# Patient Record
Sex: Male | Born: 1955 | Race: Black or African American | Hispanic: No | Marital: Married | Smoking: Current every day smoker
Health system: Southern US, Community
[De-identification: ages and names within clinical notes are randomized; demographics above are authoritative.]

## PROBLEM LIST (undated history)

## (undated) DIAGNOSIS — M1990 Unspecified osteoarthritis, unspecified site: Secondary | ICD-10-CM

## (undated) DIAGNOSIS — I340 Nonrheumatic mitral (valve) insufficiency: Secondary | ICD-10-CM

## (undated) DIAGNOSIS — Z8739 Personal history of other diseases of the musculoskeletal system and connective tissue: Secondary | ICD-10-CM

## (undated) DIAGNOSIS — E119 Type 2 diabetes mellitus without complications: Secondary | ICD-10-CM

## (undated) DIAGNOSIS — E785 Hyperlipidemia, unspecified: Secondary | ICD-10-CM

## (undated) DIAGNOSIS — G8918 Other acute postprocedural pain: Secondary | ICD-10-CM

## (undated) DIAGNOSIS — R011 Cardiac murmur, unspecified: Secondary | ICD-10-CM

## (undated) DIAGNOSIS — R29898 Other symptoms and signs involving the musculoskeletal system: Secondary | ICD-10-CM

## (undated) DIAGNOSIS — G894 Chronic pain syndrome: Secondary | ICD-10-CM

## (undated) DIAGNOSIS — M5410 Radiculopathy, site unspecified: Secondary | ICD-10-CM

## (undated) DIAGNOSIS — I10 Essential (primary) hypertension: Secondary | ICD-10-CM

## (undated) DIAGNOSIS — G8929 Other chronic pain: Secondary | ICD-10-CM

## (undated) HISTORY — DX: Personal history of other diseases of the musculoskeletal system and connective tissue: Z87.39

## (undated) HISTORY — PX: PICC LINE PLACE PERIPHERAL (ARMC HX): HXRAD1248

## (undated) HISTORY — DX: Other symptoms and signs involving the musculoskeletal system: R29.898

## (undated) HISTORY — DX: Unspecified osteoarthritis, unspecified site: M19.90

## (undated) HISTORY — DX: Essential (primary) hypertension: I10

## (undated) HISTORY — DX: Chronic pain syndrome: G89.4

## (undated) HISTORY — PX: SPINE SURGERY: SHX786

## (undated) HISTORY — DX: Type 2 diabetes mellitus without complications: E11.9

## (undated) HISTORY — DX: Nonrheumatic mitral (valve) insufficiency: I34.0

## (undated) HISTORY — DX: Hyperlipidemia, unspecified: E78.5

## (undated) HISTORY — DX: Cardiac murmur, unspecified: R01.1

## (undated) HISTORY — DX: Other chronic pain: G89.29

## (undated) HISTORY — DX: Other acute postprocedural pain: G89.18

## (undated) HISTORY — DX: Radiculopathy, site unspecified: M54.10

## (undated) HISTORY — PX: CARPAL TUNNEL RELEASE: SHX101

---

## 2008-03-28 ENCOUNTER — Ambulatory Visit: Payer: Self-pay | Admitting: General Surgery

## 2011-10-02 DIAGNOSIS — F17200 Nicotine dependence, unspecified, uncomplicated: Secondary | ICD-10-CM | POA: Insufficient documentation

## 2011-10-02 DIAGNOSIS — E669 Obesity, unspecified: Secondary | ICD-10-CM | POA: Insufficient documentation

## 2011-10-02 DIAGNOSIS — B182 Chronic viral hepatitis C: Secondary | ICD-10-CM | POA: Insufficient documentation

## 2012-10-20 DIAGNOSIS — I10 Essential (primary) hypertension: Secondary | ICD-10-CM | POA: Insufficient documentation

## 2012-10-20 DIAGNOSIS — E785 Hyperlipidemia, unspecified: Secondary | ICD-10-CM | POA: Insufficient documentation

## 2012-10-20 DIAGNOSIS — N529 Male erectile dysfunction, unspecified: Secondary | ICD-10-CM | POA: Insufficient documentation

## 2014-01-01 ENCOUNTER — Emergency Department: Payer: Self-pay | Admitting: Emergency Medicine

## 2014-01-01 LAB — CBC
HCT: 40.2 % (ref 40.0–52.0)
HGB: 13.6 g/dL (ref 13.0–18.0)
MCH: 31 pg (ref 26.0–34.0)
MCHC: 33.7 g/dL (ref 32.0–36.0)
MCV: 92 fL (ref 80–100)
Platelet: 170 10*3/uL (ref 150–440)
RBC: 4.38 10*6/uL — ABNORMAL LOW (ref 4.40–5.90)
RDW: 14.2 % (ref 11.5–14.5)
WBC: 10.4 10*3/uL (ref 3.8–10.6)

## 2014-01-01 LAB — URINALYSIS, COMPLETE
BACTERIA: NONE SEEN
Bilirubin,UR: NEGATIVE
Blood: NEGATIVE
Glucose,UR: NEGATIVE mg/dL (ref 0–75)
KETONE: NEGATIVE
Leukocyte Esterase: NEGATIVE
NITRITE: NEGATIVE
PH: 5 (ref 4.5–8.0)
Protein: 75
SPECIFIC GRAVITY: 1.025 (ref 1.003–1.030)
SQUAMOUS EPITHELIAL: NONE SEEN
WBC UR: 1 /HPF (ref 0–5)

## 2014-01-01 LAB — BASIC METABOLIC PANEL
ANION GAP: 5 — AB (ref 7–16)
BUN: 15 mg/dL (ref 7–18)
Calcium, Total: 8.6 mg/dL (ref 8.5–10.1)
Chloride: 109 mmol/L — ABNORMAL HIGH (ref 98–107)
Co2: 24 mmol/L (ref 21–32)
Creatinine: 1.04 mg/dL (ref 0.60–1.30)
Glucose: 140 mg/dL — ABNORMAL HIGH (ref 65–99)
OSMOLALITY: 279 (ref 275–301)
Potassium: 4.4 mmol/L (ref 3.5–5.1)
Sodium: 138 mmol/L (ref 136–145)

## 2014-01-01 LAB — TROPONIN I: Troponin-I: <0.02 ng/mL

## 2014-01-02 LAB — PRO B NATRIURETIC PEPTIDE: B-TYPE NATIURETIC PEPTID: 400 pg/mL — AB (ref 0–125)

## 2014-05-15 ENCOUNTER — Ambulatory Visit: Payer: Self-pay | Admitting: Anesthesiology

## 2014-05-16 ENCOUNTER — Ambulatory Visit: Payer: Self-pay | Admitting: Orthopedic Surgery

## 2014-05-19 DIAGNOSIS — Z9889 Other specified postprocedural states: Secondary | ICD-10-CM | POA: Insufficient documentation

## 2014-06-15 ENCOUNTER — Ambulatory Visit: Payer: Self-pay | Admitting: Orthopedic Surgery

## 2014-06-26 DIAGNOSIS — M5410 Radiculopathy, site unspecified: Secondary | ICD-10-CM

## 2014-06-26 HISTORY — DX: Radiculopathy, site unspecified: M54.10

## 2014-07-07 DIAGNOSIS — R29898 Other symptoms and signs involving the musculoskeletal system: Secondary | ICD-10-CM

## 2014-07-07 HISTORY — DX: Other symptoms and signs involving the musculoskeletal system: R29.898

## 2014-08-24 ENCOUNTER — Encounter: Payer: Self-pay | Admitting: Podiatry

## 2014-08-24 ENCOUNTER — Ambulatory Visit (INDEPENDENT_AMBULATORY_CARE_PROVIDER_SITE_OTHER): Payer: 59 | Admitting: Podiatry

## 2014-08-24 VITALS — BP 139/75 | HR 78 | Resp 16 | Ht 74.0 in | Wt 208.0 lb

## 2014-08-24 DIAGNOSIS — M79609 Pain in unspecified limb: Secondary | ICD-10-CM

## 2014-08-24 DIAGNOSIS — L84 Corns and callosities: Secondary | ICD-10-CM

## 2014-08-24 DIAGNOSIS — B351 Tinea unguium: Secondary | ICD-10-CM

## 2014-08-24 DIAGNOSIS — M79676 Pain in unspecified toe(s): Secondary | ICD-10-CM

## 2014-08-24 NOTE — Patient Instructions (Signed)
Diabetes and Foot Care Diabetes may cause you to have problems because of poor blood supply (circulation) to your feet and legs. This may cause the skin on your feet to become thinner, break easier, and heal more slowly. Your skin may become dry, and the skin may peel and crack. You may also have nerve damage in your legs and feet causing decreased feeling in them. You may not notice minor injuries to your feet that could lead to infections or more serious problems. Taking care of your feet is one of the most important things you can do for yourself.  HOME CARE INSTRUCTIONS  Wear shoes at all times, even in the house. Do not go barefoot. Bare feet are easily injured.  Check your feet daily for blisters, cuts, and redness. If you cannot see the bottom of your feet, use a mirror or ask someone for help.  Wash your feet with warm water (do not use hot water) and mild soap. Then pat your feet and the areas between your toes until they are completely dry. Do not soak your feet as this can dry your skin.  Apply a moisturizing lotion or petroleum jelly (that does not contain alcohol and is unscented) to the skin on your feet and to dry, brittle toenails. Do not apply lotion between your toes.  Trim your toenails straight across. Do not dig under them or around the cuticle. File the edges of your nails with an emery board or nail file.  Do not cut corns or calluses or try to remove them with medicine.  Wear clean socks or stockings every day. Make sure they are not too tight. Do not wear knee-high stockings since they may decrease blood flow to your legs.  Wear shoes that fit properly and have enough cushioning. To break in new shoes, wear them for just a few hours a day. This prevents you from injuring your feet. Always look in your shoes before you put them on to be sure there are no objects inside.  Do not cross your legs. This may decrease the blood flow to your feet.  If you find a minor scrape,  cut, or break in the skin on your feet, keep it and the skin around it clean and dry. These areas may be cleansed with mild soap and water. Do not cleanse the area with peroxide, alcohol, or iodine.  When you remove an adhesive bandage, be sure not to damage the skin around it.  If you have a wound, look at it several times a day to make sure it is healing.  Do not use heating pads or hot water bottles. They may burn your skin. If you have lost feeling in your feet or legs, you may not know it is happening until it is too late.  Make sure your health care provider performs a complete foot exam at least annually or more often if you have foot problems. Report any cuts, sores, or bruises to your health care provider immediately. SEEK MEDICAL CARE IF:   You have an injury that is not healing.  You have cuts or breaks in the skin.  You have an ingrown nail.  You notice redness on your legs or feet.  You feel burning or tingling in your legs or feet.  You have pain or cramps in your legs and feet.  Your legs or feet are numb.  Your feet always feel cold. SEEK IMMEDIATE MEDICAL CARE IF:   There is increasing redness,   swelling, or pain in or around a wound.  There is a red line that goes up your leg.  Pus is coming from a wound.  You develop a fever or as directed by your health care provider.  You notice a bad smell coming from an ulcer or wound. Document Released: 12/12/2000 Document Revised: 08/17/2013 Document Reviewed: 05/24/2013 [employer] Patient Information 2015 [employer]. This information is not intended to replace advice given to you by your health care provider. Make sure you discuss any questions you have with your health care provider.  

## 2014-08-24 NOTE — Progress Notes (Signed)
   Subjective:    Patient ID: Willie Krueger, male    DOB: [birth year], 58 y.o.   MRN: [id number]  HPI Comments: 58 year old male presents the office today for diabetic risk assessment and nail debridement. He said that his nails are thick, discolored. States he has some tenderness over the nails with shoe gear. He has been using over-the-counter fungus treatment which she states has been helping. No other complaints at this time.  No history of ulceration. No claudication symptoms.   Review of Systems  Constitutional: Positive for activity change, appetite change and unexpected weight change.  HENT: Positive for sinus pressure.   Skin:       Change in nails  All other systems reviewed and are negative.      Objective:   Physical Exam AAO x3, NAD DP/PT pulses palpable b/l. CRT < 3 sec Protective sensation intact with Simms Weinstein monofilament. Vibratory sensation intact. Nails on lesser digits are hypertrophic, elongated, dystrophic, with slight discoloration Bilateral hallux toenails are very hypertrophic, brittle, yellow brownish discoloration, tender to palpation with large callus formation at the distal aspect of the digit connecting to the hypertrophic nails. No drainage, erythema or other clinical signs of infection. No open lesions. No leg pain, swelling, warmth. MMT 5/5      Assessment & Plan:  58 year old male with symptomatic onychomycosis, the keratotic lesions distal hallux bilaterally. -Various treatment options both surgical and conservative discussed with the patient in detail including alternatives, risks, complications.  -Nail sharply debrided V77 without complications. -Hyperkeratotic lesions sharply debrided x2 without complications. -Can continue over-the-counter fungal treatments. Discussed other treatment options with the patient. -Daily foot inspection discussed -Followup in 3 months or sooner if any palms are to arise or any changes symptoms

## 2014-11-09 DIAGNOSIS — R011 Cardiac murmur, unspecified: Secondary | ICD-10-CM | POA: Insufficient documentation

## 2014-11-11 DIAGNOSIS — I340 Nonrheumatic mitral (valve) insufficiency: Secondary | ICD-10-CM | POA: Insufficient documentation

## 2014-11-16 ENCOUNTER — Ambulatory Visit (INDEPENDENT_AMBULATORY_CARE_PROVIDER_SITE_OTHER): Payer: 59 | Admitting: Podiatry

## 2014-11-16 DIAGNOSIS — M79676 Pain in unspecified toe(s): Secondary | ICD-10-CM

## 2014-11-16 DIAGNOSIS — B351 Tinea unguium: Secondary | ICD-10-CM

## 2014-11-16 DIAGNOSIS — L84 Corns and callosities: Secondary | ICD-10-CM

## 2014-11-16 NOTE — Progress Notes (Signed)
Patient ID: Willie Krueger, male   DOB: 1956-04-08, 58 y.o.   MRN: [id number]  Subjective: 58 year old male presents the office today for diabetic risk assessment and for painful elongated toenails as well as the calluses to both of his big toes. He states the nails or particular the painful with shoe gear. He does that he trims the nails on his own between appointments. Since last appointment he states that he has had back surgery and he is also seen a neurologist for neurological symptoms of his lower extremities and other issues. He states that since the back surgery he has had improvement in symptoms to his feet. He continues with the over-the-counter fungus treatment. Denies any redness or drainage from the nail sites. No other complaints at this time. States his blood sugar has been running between 119-130  Objective: AAO x3, NAD DP/PT pulses palpable bilaterally, CRT less than 3 seconds Protective sensation intact with Simms Weinstein monofilament, vibratory sensation intact Nails hypertrophic, dystrophic, elongated, dystrophic 10. There is a small amount of dried blood within the right fourth digit distal nail in which there is skin  Build-up directly under the nail. There is no drainage, surrounding erythema or any thinning saline disc. No local signs of infection.  Thick hyperkeratotic tissue old up on the plantar and distal aspect of bilateral hallux and within the distal aspect of the nail. There some evidence of dried blood underneath the callus on the right side. No surrounding erythema or ascending cellulitis.  No calf pain with compression, swelling, warmth, erythema.  MMT 5/5, ROM WNL  Assessment: 58 year old male with onychomycosis, thick hyperkeratotic lesion cyst hallux bilaterally.   Plan: -Treatment options discussed including alternatives, risks, complications -Nails sharply debrided 10. There is a small amount of bleeding noted in the right fourth digit over the area of  dried blood. Discussed with the patient to keep this area clean and apply and buttock ointment and a Band-Aid daily. If the area does not heal in the next 2 weeks to call the office to make an appointment. Also monitoring clinical signs or symptoms of infection and directed to call the office immediately if any are to occur or go to the ER. -Bilateral hallux hyperkeratotic lesion shop and debrided without consultation. There is thick hyperkeratotic tissue on the right hallux with some evidence dried blood. Upon debridement there is no open lesions identified. Discussed the patient to continue to monitor this area for any skin breakdown. -Discussed the importance of daily foot inspection. -Follow-up in 3 months. In the meantime, call the office in the questions, concerns, changes symptoms. Also call if there is any skin breakdown or any problems.

## 2014-11-16 NOTE — Patient Instructions (Signed)
Keep antibiotic ointment and a band-aid over the right 4th digit. Monitor for any signs/symptoms of infection. Call the office immediately if any occur or go directly to the emergency room. Call with any questions/concerns.   Diabetes and Foot Care Diabetes may cause you to have problems because of poor blood supply (circulation) to your feet and legs. This may cause the skin on your feet to become thinner, break easier, and heal more slowly. Your skin may become dry, and the skin may peel and crack. You may also have nerve damage in your legs and feet causing decreased feeling in them. You may not notice minor injuries to your feet that could lead to infections or more serious problems. Taking care of your feet is one of the most important things you can do for yourself.  HOME CARE INSTRUCTIONS  Wear shoes at all times, even in the house. Do not go barefoot. Bare feet are easily injured.  Check your feet daily for blisters, cuts, and redness. If you cannot see the bottom of your feet, use a mirror or ask someone for help.  Wash your feet with warm water (do not use hot water) and mild soap. Then pat your feet and the areas between your toes until they are completely dry. Do not soak your feet as this can dry your skin.  Apply a moisturizing lotion or petroleum jelly (that does not contain alcohol and is unscented) to the skin on your feet and to dry, brittle toenails. Do not apply lotion between your toes.  Trim your toenails straight across. Do not dig under them or around the cuticle. File the edges of your nails with an emery board or nail file.  Do not cut corns or calluses or try to remove them with medicine.  Wear clean socks or stockings every day. Make sure they are not too tight. Do not wear knee-high stockings since they may decrease blood flow to your legs.  Wear shoes that fit properly and have enough cushioning. To break in new shoes, wear them for just a few hours a day. This  prevents you from injuring your feet. Always look in your shoes before you put them on to be sure there are no objects inside.  Do not cross your legs. This may decrease the blood flow to your feet.  If you find a minor scrape, cut, or break in the skin on your feet, keep it and the skin around it clean and dry. These areas may be cleansed with mild soap and water. Do not cleanse the area with peroxide, alcohol, or iodine.  When you remove an adhesive bandage, be sure not to damage the skin around it.  If you have a wound, look at it several times a day to make sure it is healing.  Do not use heating pads or hot water bottles. They may burn your skin. If you have lost feeling in your feet or legs, you may not know it is happening until it is too late.  Make sure your health care provider performs a complete foot exam at least annually or more often if you have foot problems. Report any cuts, sores, or bruises to your health care provider immediately. SEEK MEDICAL CARE IF:   You have an injury that is not healing.  You have cuts or breaks in the skin.  You have an ingrown nail.  You notice redness on your legs or feet.  You feel burning or tingling in your legs  or feet.  You have pain or cramps in your legs and feet.  Your legs or feet are numb.  Your feet always feel cold. SEEK IMMEDIATE MEDICAL CARE IF:   There is increasing redness, swelling, or pain in or around a wound.  There is a red line that goes up your leg.  Pus is coming from a wound.  You develop a fever or as directed by your health care provider.  You notice a bad smell coming from an ulcer or wound. Document Released: 12/12/2000 Document Revised: 08/17/2013 Document Reviewed: 05/24/2013 [facility] Patient Information 2015 [place], [place]. This information is not intended to replace advice given to you by your health care provider. Make sure you discuss any questions you have with your health care provider.

## 2015-02-07 DIAGNOSIS — G9520 Unspecified cord compression: Secondary | ICD-10-CM | POA: Insufficient documentation

## 2015-02-15 ENCOUNTER — Ambulatory Visit: Payer: 59 | Admitting: Podiatry

## 2015-03-27 ENCOUNTER — Ambulatory Visit: Payer: 59 | Admitting: Podiatry

## 2015-04-21 NOTE — Op Note (Signed)
PATIENT NAME:  Willie Krueger, Willie Krueger MR#:  [id number] DATE OF BIRTH:  [birth year]  DATE OF PROCEDURE:  06/15/2014  PREOPERATIVE DIAGNOSIS: Left carpal tunnel syndrome.   POSTOPERATIVE DIAGNOSIS: Left carpal tunnel syndrome.   PROCEDURE: Left carpal tunnel release.   ANESTHESIA: Local with MAC.   DESCRIPTION OF PROCEDURE: The patient was brought to the Operating Room, and after attempted Bier block failed because the tourniquet would not sequentially deflate as needed, determination was made to do local with MAC. The tourniquet was applied to the forearm, and after a time out procedure was carried out, the area of the planned incision was infiltrated with 5 mL of 1% Xylocaine and 10 mL of 0.5% Sensorcaine. The arm was then prepped and draped in the usual sterile fashion. Appropriate repeat timeout procedure and patient identification procedure was carried out. Tourniquet raised to 250 mmHg. Approximately a 2.5 cm incision was made starting at the distal wrist flexion crease in line with the ring metacarpal. Skin and subcutaneous tissue was divided, and the transverse carpal ligament was identified and incised. A vascular hemostat was placed beneath this to protect the underlying structures. Release was carried out proximally and distally until the nerve was noted to be free. There was compression in the proximal half of the canal, with mild flexor tenosynovitis. Compression of the nerve appeared to be resolved at the close of the case. The wound was irrigated and then closed with simple interrupted 4-0 nylon skin sutures. Xeroform, 4 x 4, Webril and Ace wrap applied. Tourniquet was let down at the close of the case, and patient was sent to the recovery room in stable condition.   ESTIMATED BLOOD LOSS: Minimal.   COMPLICATIONS: None.   SPECIMEN: None.   TOURNIQUET TIME: 13 minutes at 250 mmHg       ____________________________ Laurene Footman, MD [email or web address] D: 06/15/2014 14:53:38 ET T: 06/15/2014  20:45:51 ET JOB#: [id number]  cc: Laurene Footman, MD, <Dictator> Laurene Footman MD ELECTRONICALLY SIGNED 06/15/2014 21:59

## 2015-04-21 NOTE — Op Note (Signed)
PATIENT NAME:  [name], Willie Krueger MR#:  [id number] DATE OF BIRTH:  1956-07-09  DATE OF PROCEDURE:  05/16/2014  PREOPERATIVE DIAGNOSIS: Right carpal tunnel syndrome.   POSTOPERATIVE DIAGNOSIS: Right carpal tunnel syndrome.   PROCEDURE: Right carpal tunnel release.   ANESTHESIA: Bier block.   SURGEON: Laurene Footman, M.D.   DESCRIPTION OF PROCEDURE: The patient was brought to the operating room and after adequate anesthesia was obtained, the arm was prepped and draped in the usual sterile fashion. After patient identification and timeout procedures were completed, the hand was approached with a volar incision approximately 2 cm long in line with the fourth metacarpal. Skin and subcutaneous tissue were dissected and the transverse carpal ligament identified. This was opened, and a small hemostat was placed underneath the transverse carpal ligament. It was released distally and then proximally. At the proximal release, compression was noted within the carpal tunnel. There were no masses. Mild flexor tenosynovitis. The wound was then irrigated and infiltrated with 10 mL of 0.5% Sensorcaine for postop analgesia. The wound was closed with simple interrupted 4-0 nylon skin sutures. Xeroform, 4 x 4, Webril and Ace wrap were applied. Tourniquet was let down.   TOURNIQUET TIME: 21 minutes at 300 mmHg.   COMPLICATIONS: None.   SPECIMEN: None.   ____________________________ Laurene Footman, MD [email or web address] D: 05/16/2014 22:02:12 ET T: 05/16/2014 22:18:21 ET JOB#: [id number]  cc: Laurene Footman, MD, <Dictator> Laurene Footman MD ELECTRONICALLY SIGNED 05/17/2014 10:11

## 2015-05-04 DIAGNOSIS — G894 Chronic pain syndrome: Secondary | ICD-10-CM

## 2015-05-04 HISTORY — DX: Chronic pain syndrome: G89.4

## 2015-06-19 ENCOUNTER — Ambulatory Visit (INDEPENDENT_AMBULATORY_CARE_PROVIDER_SITE_OTHER): Payer: 59 | Admitting: Podiatry

## 2015-06-19 DIAGNOSIS — M79676 Pain in unspecified toe(s): Secondary | ICD-10-CM

## 2015-06-19 DIAGNOSIS — B351 Tinea unguium: Secondary | ICD-10-CM | POA: Diagnosis not present

## 2015-06-19 DIAGNOSIS — Q828 Other specified congenital malformations of skin: Secondary | ICD-10-CM

## 2015-06-19 DIAGNOSIS — L97522 Non-pressure chronic ulcer of other part of left foot with fat layer exposed: Secondary | ICD-10-CM

## 2015-06-19 MED ORDER — CEPHALEXIN 500 MG PO CAPS: 500.0000 mg | ORAL_CAPSULE | Freq: Three times a day (TID) | ORAL | Status: DC

## 2015-06-19 NOTE — Progress Notes (Signed)
Patient ID: Willie Krueger, male   DOB: 1956/06/06, 59 y.o.   MRN: [id number]  Subjective: 59 y.o. male returns the office today for painful, elongated, thickened toenails which he is unable to trim himself.. Denies any redness or drainage around the nails. Denies any acute changes since last appointment and no new complaints today. Denies any systemic complaints such as fevers, chills, nausea, vomiting.   Objective: AAO 3, NAD DP/PT pulses palpable, CRT less than 3 seconds Protective sensation intact with Simms Weinstein monofilament, Achilles tendon reflex intact.  Nails hypertrophic, dystrophic, elongated, brittle, discolored 10. There is tenderness overlying the nails 1-5 bilaterally. There is no surrounding erythema or drainage along the nail sites. On the plantar aspect of the right hallux there is hyperkeratotic lesion. Upon debridement lesion there is an ulceration measuring fussy 1 x 1 cm with serous drainage underlying area. There is no purulence identified and there is no swelling erythema, ascending cellulitis, fluctuance, crepitus. There is no probe to bone, undermining, tunneling. No malodor. There is edema to the right foot and there is a slight increase in warmth to the foot. There is no specific areas of cellulitis or erythema. There is no further ulceration identified. No other open lesions or pre-ulcerative lesions are identified. No other areas of tenderness bilateral lower extremities. No overlying edema, erythema, increased warmth. No pain with calf compression, swelling, warmth, erythema.  Assessment: Patient presents with symptomatic onychomycosis; right foot swelling, ulceration  Plan: -Treatment options including alternatives, risks, complications were discussed -Nails sharply debrided 10 without complication/bleeding. -Right hallux ulceration/hyperkeratotic lesion sharply debrided without complications. Given the increase in warmth and swelling to the right foot will  start Keflex for infection. Monitor closely for any signs or symptoms of worsening infection and directed to call the office immediately should any occur go directly to the emergency room. -Discussed daily foot inspection. If there are any changes, to call the office immediately.  -Follow-up in 1 week or sooner if any problems are to arise. In the meantime, encouraged to call the office with any questions, concerns, changes symptoms.  Celesta Gentile, DPM

## 2015-06-19 NOTE — Patient Instructions (Signed)
Monitor for any signs/symptoms of infection. Call the office immediately if any occur or go directly to the emergency room. Call with any questions/concerns.  

## 2015-06-24 DIAGNOSIS — L97509 Non-pressure chronic ulcer of other part of unspecified foot with unspecified severity: Secondary | ICD-10-CM | POA: Insufficient documentation

## 2015-06-26 ENCOUNTER — Ambulatory Visit: Payer: 59 | Admitting: Podiatry

## 2015-08-23 ENCOUNTER — Ambulatory Visit: Payer: 59 | Admitting: Podiatry

## 2015-09-04 ENCOUNTER — Ambulatory Visit (INDEPENDENT_AMBULATORY_CARE_PROVIDER_SITE_OTHER): Payer: 59 | Admitting: Podiatry

## 2015-09-04 ENCOUNTER — Encounter: Payer: Self-pay | Admitting: Podiatry

## 2015-09-04 ENCOUNTER — Ambulatory Visit (INDEPENDENT_AMBULATORY_CARE_PROVIDER_SITE_OTHER): Payer: 59

## 2015-09-04 VITALS — BP 134/78 | HR 72 | Resp 18

## 2015-09-04 DIAGNOSIS — R52 Pain, unspecified: Secondary | ICD-10-CM

## 2015-09-04 DIAGNOSIS — L97511 Non-pressure chronic ulcer of other part of right foot limited to breakdown of skin: Secondary | ICD-10-CM

## 2015-09-04 DIAGNOSIS — M79676 Pain in unspecified toe(s): Secondary | ICD-10-CM | POA: Diagnosis not present

## 2015-09-04 DIAGNOSIS — B351 Tinea unguium: Secondary | ICD-10-CM

## 2015-09-04 MED ORDER — CEPHALEXIN 500 MG PO CAPS: 500.0000 mg | ORAL_CAPSULE | Freq: Three times a day (TID) | ORAL | Status: DC

## 2015-09-04 NOTE — Progress Notes (Signed)
Patient ID: Willie Krueger, male   DOB: March 31, 1956, 59 y.o.   MRN: [id number]  Subjective: 59 year old male presents the office today for follow-up evaluation of right hallux ulceration, swelling. He states he is missed last couple appointments because he overslept. He states that after last appointment he did complete the antibiotics he has been applying antibiotic ointment to the wound daily. He has noticed the toe become somewhat more swollen over the last couple weeks. He denies any drainage or purulence from the wound. Denies any surrounding redness or red streaks. Denies any systemic complaints such as fevers, chills, nausea, vomiting. No calf pain, chest pain, shortness of breath. No other complaints at this time in no acute changes otherwise.  Objective: AAO 3, NAD DP/PT pulses palpable, CRT less than 3 seconds Protective sensation decreased with Simms Weinstein monofilament There is mild edema to the right hallux. The distal aspect is a hyperkeratotic lesion with associated annular ulceration the distal aspect of the hallux. Upon debridement the ulceration measures 0.5 x 0.5 cm in a superficial with a granular wound base. There is no probing, undermining, tunneling. There is no drainage or purulence. There is no surrounding erythema, ascending cellulitis, fluctuance, crepitus, malodor.  On the medial aspect of the left foot there is a pre-ulcerative lesion. Upon debridement underlying skin is intact. No other open lesions or pre-ulcer lesions identified. Nails hypertrophic, dystrophic, brittle, discolored, elongated 10. There is tenderness overlying nails 1-5 bilaterally. There is no surrounding erythema or drainage from the nail sites. No other areas of tenderness to bilateral lower extremities No pain with calf compression, selling, warmth, erythema.  Assessment: 59 year old male right hallux ulceration with localized infection; symptomatic onychomycosis  Plan: -X-rays were obtained  and reviewed with the patient. There is no definitive cortical destruction to suggest osteomyelitis at this time. No soft tissue emphysema. -Treatment options discussed including all alternatives, risks, and complications -Hyperkeratotic lesions/wound was sharply debrided to healthy, bleeding, granular wound base. Iodosorb was applied followed by dry sterile dressing. Continue with daily dressing changes the antibiotic ointment and a bandage. -Prescribed Keflex -Dispensed surgical shoe to help offload the area -Nail sharply debrided 10 without complication/bleeding -Monitor for any clinical signs or symptoms of infection and directed to call the office immediately should any occur or go to the ER. -Follow-up in 10 days or sooner if any problems arise. In the meantime, encouraged to call the office with any questions, concerns, change in symptoms.   Celesta Gentile, DPM

## 2015-09-18 ENCOUNTER — Ambulatory Visit (INDEPENDENT_AMBULATORY_CARE_PROVIDER_SITE_OTHER): Payer: 59 | Admitting: Podiatry

## 2015-09-18 ENCOUNTER — Encounter: Payer: Self-pay | Admitting: Podiatry

## 2015-09-18 VITALS — BP 154/92 | HR 75 | Resp 18

## 2015-09-18 DIAGNOSIS — L97522 Non-pressure chronic ulcer of other part of left foot with fat layer exposed: Secondary | ICD-10-CM | POA: Diagnosis not present

## 2015-09-18 NOTE — Progress Notes (Signed)
Patient ID: Willie Krueger, male   DOB: [birth year], 59 y.o.   MRN: [id number]  Subjective: 59 year old male presents the office today for follow-up evaluation of right hallux ulceration. He believes that since last appointment the swelling has decreased his big toe although does continue. He also today several wounds the distal aspect of the hallux. Denies any drainage upon scarring from the area. Denies any redness of the big toe or any red streaks. He is continued on antibiotics. He currently denies any systemic complaints such as fevers, chills, nausea, vomiting. No calf pain, chest pain, shortness of breath. No other complaints at this time in no acute changes otherwise.  Objective: AAO 3, NAD DP/PT pulses palpable, CRT less than 3 seconds Protective sensation decreased with Simms Weinstein monofilament There is mild but improved edema to the right hallux. There are skin lines present in the hallux representative of decreased edema. The distal aspect is a hyperkeratotic lesion with associated annular ulceration the distal aspect of the hallux. Upon debridement the ulceration measures 0.5 x 0.4 x 0.2  cm in a superficial with a granular wound base. There was no purulence expressed. There is only bloody drainage identified. There is no surrounding erythema, ascending cellulitis, fluctuance, crepitus, malodor.  No other open lesions or pre-ulcer lesions identified. No other areas of tenderness to bilateral lower extremities No pain with calf compression, selling, warmth, erythema.  Assessment: 59 year old male right hallux ulceration with resolving infection   Plan: -Treatment options discussed including all alternatives, risks, and complications -Wound was sharply debrided without complications to help it, bleeding, granular wound base. Iodosorb was applied followed by dry sterile dressing. Continue with daily dressing changes with antibiotic ointment and a bandage. -Continue surgical shoe for  offloading for which she has today. Elizebeth Koller course of antibiotics. -Monitor for any clinical signs or symptoms of infection and directed to call the office immediately should any occur or go to the ER. -Follow-up in 10 days or sooner if any problems arise. In the meantime, encouraged to call the office with any questions, concerns, change in symptoms.   Celesta Gentile, DPM

## 2015-10-08 ENCOUNTER — Ambulatory Visit (INDEPENDENT_AMBULATORY_CARE_PROVIDER_SITE_OTHER): Payer: 59 | Admitting: Podiatry

## 2015-10-08 ENCOUNTER — Encounter: Payer: Self-pay | Admitting: Podiatry

## 2015-10-08 VITALS — BP 192/106 | HR 78 | Resp 18

## 2015-10-08 DIAGNOSIS — L97522 Non-pressure chronic ulcer of other part of left foot with fat layer exposed: Secondary | ICD-10-CM | POA: Diagnosis not present

## 2015-10-08 MED ORDER — AMOXICILLIN-POT CLAVULANATE 875-125 MG PO TABS: 1.0000 | ORAL_TABLET | Freq: Two times a day (BID) | ORAL | Status: DC

## 2015-10-13 ENCOUNTER — Encounter: Payer: Self-pay | Admitting: Podiatry

## 2015-10-13 NOTE — Progress Notes (Signed)
Patient ID: Willie Krueger, male   DOB: [birth year], 59 y.o.   MRN: [id number]  Subjective: 59 year old male presents the office today for follow-up evaluation of right hallux ulceration. He states he is continue with antibiotics. He states that it is not as swollen the toe is not as red. He denies any drainage from the wound of  The right hallux. He does apply antibiotic ointment over the area daily. No other complaints at this time in no acute changes. He denies any systemic complaints such as fevers, chills, nausea, vomiting. No calf pain, chest pain, shortness of breath.  Objective: AAO 3, NAD DP/PT pulses palpable 2/4, CRT less than 3 seconds Protective sensation decreased with Simms Weinstein monofilament There is improved edema to the right hallux, but it does continue. The distal aspect of the hallux is a thick  hyperkeratotic lesion with associated annular ulceration the distal aspect of the hallux. Upon debridement the ulceration measures 0.5 x 0.3  cm in a superficial with a granular wound base. There was no purulence expressed. There is no surrounding erythema, ascending cellulitis, fluctuance, crepitus, malodor.  No other open lesions or pre-ulcer lesions identified. No other areas of tenderness to bilateral lower extremities No pain with calf compression, selling, warmth, erythema.  Assessment: 59 year old male right hallux ulceration with resolving infection   Plan: -Treatment options discussed including all alternatives, risks, and complications -Wound was sharply debrided without complications to help it, bleeding, granular wound base. Iodosorb was applied followed by dry sterile dressing.  There is also significant amount hyperkeratotic tissue along the distal aspect of the hallux  Which was debrided as well.Continue with daily dressing changes with antibiotic ointment and a bandage. -Continue surgical shoe for offloading for which she has today. Elizebeth Koller course of  antibiotics. -Monitor for any clinical signs or symptoms of infection and directed to call the office immediately should any occur or go to the ER. -Follow-up in 2 weeks or sooner if any problems arise. In the meantime, encouraged to call the office with any questions, concerns, change in symptoms.  *If swelling continues may need an MRI.  Celesta Gentile, DPM

## 2015-10-22 ENCOUNTER — Ambulatory Visit (INDEPENDENT_AMBULATORY_CARE_PROVIDER_SITE_OTHER): Payer: 59

## 2015-10-22 ENCOUNTER — Ambulatory Visit (INDEPENDENT_AMBULATORY_CARE_PROVIDER_SITE_OTHER): Payer: 59 | Admitting: Podiatry

## 2015-10-22 ENCOUNTER — Encounter: Payer: Self-pay | Admitting: Podiatry

## 2015-10-22 VITALS — BP 176/97 | HR 87 | Resp 18

## 2015-10-22 DIAGNOSIS — M86171 Other acute osteomyelitis, right ankle and foot: Secondary | ICD-10-CM

## 2015-10-22 DIAGNOSIS — M79671 Pain in right foot: Secondary | ICD-10-CM | POA: Diagnosis not present

## 2015-10-22 DIAGNOSIS — L97521 Non-pressure chronic ulcer of other part of left foot limited to breakdown of skin: Secondary | ICD-10-CM

## 2015-10-22 MED ORDER — CLINDAMYCIN HCL 300 MG PO CAPS: 300.0000 mg | ORAL_CAPSULE | Freq: Three times a day (TID) | ORAL | Status: DC

## 2015-10-22 NOTE — Progress Notes (Signed)
Patient ID: Willie Krueger, male   DOB: 1956-06-16, 59 y.o.   MRN: [id number]  Subjective: Patient was infiltrated for follow-up evaluation of right hallux ulceration. He continues to change the dressing daily with Silvadene. He does state that the right foot remains swollen. Denies any pus or drainage, never denies any swelling redness or red streaks. He denies any systemic complaints such as fevers, chills, nausea, vomiting. He is also requesting nail debridement time as they're elongated causing pressure in his shoes. Denies any redness or drainage in the nail sites. No other complaints at this time.  Objective: AAO 3, NAD DP/PT pulses 2/4, CRT less than 3 seconds Protective sensation decreased with Simms Weinstein monofilament Discontinuation and also the distal aspect of the right hallux which appears to be superficial granular wound base. There is hyperkeratotic tissue over on the distal tip of the hallux. There continues to be edema to the hallux and there is increased edema to the foot as well. There is a slight increase in warmth the right foot compared to the left foot. There is no areas of fluctuation or crepitus. No surrounding erythema, ascending cellulitis. No other open lesions or pre-ulcer lesions identified bilaterally. The nails are hypertrophic, dystrophic, discolored. There is no swelling erythema drainage on the remaining nails. No pain with calf compression, swelling, warmth, erythema.  Assessment: 59 year old male with continuation right hallux ulceration with increased edema/warmth to the right foot; onychomycosis  Plan: -At this time the symptoms continue and the right foot continues: I recommended MRI. X-rays obtained today which did reveal possible cortical changes of the distal phalanx of the hallux concerning prostate myelitis. An MRI was ordered. Continue daily dressing changes. The wound was debrided of hyperkeratotic tissue today's appointment. Prescribed clindamycin.   -The nails were also debrided without complication/bleeding. Monitor for any clinical signs or symptoms of infection and directed to call the office immediately should any occur or go to the ER. -Follow-up in 2 weeks or sooner if any problems arise. In the meantime, encouraged to call the office with any questions, concerns, change in symptoms.   Celesta Gentile, DPM

## 2015-10-24 ENCOUNTER — Telehealth: Payer: Self-pay

## 2015-10-24 NOTE — Telephone Encounter (Signed)
Prior auth received from [place] #[id number], faxed over clinical notes to [phone] for MRI right foot w/o contrast, rule out osteomyletis. Auth number faxed to [id number] [facility]

## 2015-10-26 ENCOUNTER — Encounter: Payer: Self-pay | Admitting: *Deleted

## 2015-10-26 ENCOUNTER — Telehealth: Payer: Self-pay | Admitting: *Deleted

## 2015-10-26 MED ORDER — DOXYCYCLINE HYCLATE 100 MG PO TABS: 100.0000 mg | ORAL_TABLET | Freq: Two times a day (BID) | ORAL | Status: DC

## 2015-10-26 NOTE — Telephone Encounter (Addendum)
Pt states the Cleocin is causing his legs to break out in a rash.  I instructed pt to stop the Cleocin, begin OTC Benadryl as package directs, I also informed pt the Benadryl may increase the sleepiness with his other medication.  I told pt if he had difficulty breathing go to the ER.  Dr. Jacqualyn Posey ordered Doxycycline 100mg  #20 1 bid.

## 2015-10-31 ENCOUNTER — Encounter: Payer: Self-pay | Admitting: Pain Medicine

## 2015-10-31 ENCOUNTER — Other Ambulatory Visit: Payer: Self-pay | Admitting: Pain Medicine

## 2015-10-31 ENCOUNTER — Ambulatory Visit: Payer: 59 | Attending: Pain Medicine | Admitting: Pain Medicine

## 2015-10-31 VITALS — BP 150/94 | HR 83 | Temp 98.0°F | Resp 18 | Ht 74.0 in | Wt 239.0 lb

## 2015-10-31 DIAGNOSIS — Q762 Congenital spondylolisthesis: Secondary | ICD-10-CM

## 2015-10-31 DIAGNOSIS — M5412 Radiculopathy, cervical region: Secondary | ICD-10-CM

## 2015-10-31 DIAGNOSIS — B182 Chronic viral hepatitis C: Secondary | ICD-10-CM | POA: Insufficient documentation

## 2015-10-31 DIAGNOSIS — G8929 Other chronic pain: Secondary | ICD-10-CM

## 2015-10-31 DIAGNOSIS — M542 Cervicalgia: Secondary | ICD-10-CM | POA: Diagnosis not present

## 2015-10-31 DIAGNOSIS — M79604 Pain in right leg: Secondary | ICD-10-CM

## 2015-10-31 DIAGNOSIS — E669 Obesity, unspecified: Secondary | ICD-10-CM | POA: Diagnosis not present

## 2015-10-31 DIAGNOSIS — F1990 Other psychoactive substance use, unspecified, uncomplicated: Secondary | ICD-10-CM

## 2015-10-31 DIAGNOSIS — M47812 Spondylosis without myelopathy or radiculopathy, cervical region: Secondary | ICD-10-CM

## 2015-10-31 DIAGNOSIS — F1120 Opioid dependence, uncomplicated: Secondary | ICD-10-CM | POA: Insufficient documentation

## 2015-10-31 DIAGNOSIS — F1190 Opioid use, unspecified, uncomplicated: Secondary | ICD-10-CM | POA: Diagnosis not present

## 2015-10-31 DIAGNOSIS — M5416 Radiculopathy, lumbar region: Secondary | ICD-10-CM

## 2015-10-31 DIAGNOSIS — Z79891 Long term (current) use of opiate analgesic: Secondary | ICD-10-CM

## 2015-10-31 DIAGNOSIS — M545 Low back pain, unspecified: Secondary | ICD-10-CM

## 2015-10-31 DIAGNOSIS — G96198 Other disorders of meninges, not elsewhere classified: Secondary | ICD-10-CM

## 2015-10-31 DIAGNOSIS — E785 Hyperlipidemia, unspecified: Secondary | ICD-10-CM | POA: Insufficient documentation

## 2015-10-31 DIAGNOSIS — M4316 Spondylolisthesis, lumbar region: Secondary | ICD-10-CM | POA: Diagnosis not present

## 2015-10-31 DIAGNOSIS — G5603 Carpal tunnel syndrome, bilateral upper limbs: Secondary | ICD-10-CM | POA: Insufficient documentation

## 2015-10-31 DIAGNOSIS — Z79899 Other long term (current) drug therapy: Secondary | ICD-10-CM | POA: Diagnosis not present

## 2015-10-31 DIAGNOSIS — Z9889 Other specified postprocedural states: Secondary | ICD-10-CM

## 2015-10-31 DIAGNOSIS — M4310 Spondylolisthesis, site unspecified: Secondary | ICD-10-CM

## 2015-10-31 DIAGNOSIS — M549 Dorsalgia, unspecified: Secondary | ICD-10-CM | POA: Diagnosis present

## 2015-10-31 DIAGNOSIS — Z5181 Encounter for therapeutic drug level monitoring: Secondary | ICD-10-CM | POA: Insufficient documentation

## 2015-10-31 DIAGNOSIS — Z8739 Personal history of other diseases of the musculoskeletal system and connective tissue: Secondary | ICD-10-CM

## 2015-10-31 DIAGNOSIS — M79606 Pain in leg, unspecified: Secondary | ICD-10-CM

## 2015-10-31 DIAGNOSIS — M961 Postlaminectomy syndrome, not elsewhere classified: Secondary | ICD-10-CM

## 2015-10-31 DIAGNOSIS — M539 Dorsopathy, unspecified: Secondary | ICD-10-CM

## 2015-10-31 DIAGNOSIS — G9619 Other disorders of meninges, not elsewhere classified: Secondary | ICD-10-CM

## 2015-10-31 DIAGNOSIS — M79605 Pain in left leg: Secondary | ICD-10-CM

## 2015-10-31 HISTORY — DX: Personal history of other diseases of the musculoskeletal system and connective tissue: Z87.39

## 2015-10-31 MED ORDER — GABAPENTIN 400 MG PO CAPS: 800.0000 mg | ORAL_CAPSULE | Freq: Four times a day (QID) | ORAL | Status: DC

## 2015-10-31 MED ORDER — OXYCODONE HCL 15 MG PO TABS: 15.0000 mg | ORAL_TABLET | Freq: Every day | ORAL | Status: DC | PRN

## 2015-10-31 NOTE — Progress Notes (Signed)
Safety precautions to be maintained throughout the outpatient stay will include: orient to surroundings, keep bed in low position, maintain call bell within reach at all times, provide assistance with transfer out of bed and ambulation. Oxycodone pill count # 148/180

## 2015-10-31 NOTE — Patient Instructions (Signed)
Smoking Cessation, Tips for Success If you are ready to quit smoking, congratulations! You have chosen to help yourself be healthier. Cigarettes bring nicotine, tar, carbon monoxide, and other irritants into your body. Your lungs, heart, and blood vessels will be able to work better without these poisons. There are many different ways to quit smoking. Nicotine gum, nicotine patches, a nicotine inhaler, or nicotine nasal spray can help with physical craving. Hypnosis, support groups, and medicines help break the habit of smoking. WHAT THINGS CAN I DO TO MAKE QUITTING EASIER?  Here are some tips to help you quit for good:  Pick a date when you will quit smoking completely. Tell all of your friends and family about your plan to quit on that date.  Do not try to slowly cut down on the number of cigarettes you are smoking. Pick a quit date and quit smoking completely starting on that day.  Throw away all cigarettes.   Clean and remove all ashtrays from your home, work, and car.  On a card, write down your reasons for quitting. Carry the card with you and read it when you get the urge to smoke.  Cleanse your body of nicotine. Drink enough water and fluids to keep your urine clear or pale yellow. Do this after quitting to flush the nicotine from your body.  Learn to predict your moods. Do not let a bad situation be your excuse to have a cigarette. Some situations in your life might tempt you into wanting a cigarette.  Never have "just one" cigarette. It leads to wanting another and another. Remind yourself of your decision to quit.  Change habits associated with smoking. If you smoked while driving or when feeling stressed, try other activities to replace smoking. Stand up when drinking your coffee. Brush your teeth after eating. Sit in a different chair when you read the paper. Avoid alcohol while trying to quit, and try to drink fewer caffeinated beverages. Alcohol and caffeine may urge you to  smoke.  Avoid foods and drinks that can trigger a desire to smoke, such as sugary or spicy foods and alcohol.  Ask people who smoke not to smoke around you.  Have something planned to do right after eating or having a cup of coffee. For example, plan to take a walk or exercise.  Try a relaxation exercise to calm you down and decrease your stress. Remember, you may be tense and nervous for the first 2 weeks after you quit, but this will pass.  Find new activities to keep your hands busy. Play with a pen, coin, or rubber band. Doodle or draw things on paper.  Brush your teeth right after eating. This will help cut down on the craving for the taste of tobacco after meals. You can also try mouthwash.   Use oral substitutes in place of cigarettes. Try using lemon drops, carrots, cinnamon sticks, or chewing gum. Keep them handy so they are available when you have the urge to smoke.  When you have the urge to smoke, try deep breathing.  Designate your home as a nonsmoking area.  If you are a heavy smoker, ask your health care provider about a prescription for nicotine chewing gum. It can ease your withdrawal from nicotine.  Reward yourself. Set aside the cigarette money you save and buy yourself something nice.  Look for support from others. Join a support group or smoking cessation program. Ask someone at home or at work to help you with your plan   to quit smoking.  Always ask yourself, "Do I need this cigarette or is this just a reflex?" Tell yourself, "Today, I choose not to smoke," or "I do not want to smoke." You are reminding yourself of your decision to quit.  Do not replace cigarette smoking with electronic cigarettes (commonly called e-cigarettes). The safety of e-cigarettes is unknown, and some may contain harmful chemicals.  If you relapse, do not give up! Plan ahead and think about what you will do the next time you get the urge to smoke. HOW WILL I FEEL WHEN I QUIT SMOKING? You  may have symptoms of withdrawal because your body is used to nicotine (the addictive substance in cigarettes). You may crave cigarettes, be irritable, feel very hungry, cough often, get headaches, or have difficulty concentrating. The withdrawal symptoms are only temporary. They are strongest when you first quit but will go away within 10-14 days. When withdrawal symptoms occur, stay in control. Think about your reasons for quitting. Remind yourself that these are signs that your body is healing and getting used to being without cigarettes. Remember that withdrawal symptoms are easier to treat than the major diseases that smoking can cause.  Even after the withdrawal is over, expect periodic urges to smoke. However, these cravings are generally short lived and will go away whether you smoke or not. Do not smoke! WHAT RESOURCES ARE AVAILABLE TO HELP ME QUIT SMOKING? Your health care provider can direct you to community resources or hospitals for support, which may include:  Group support.  Education.  Hypnosis.  Therapy.   This information is not intended to replace advice given to you by your health care provider. Make sure you discuss any questions you have with your health care provider.   Document Released: 09/12/2004 Document Revised: 01/05/2015 Document Reviewed: 06/02/2013 [employer] Interactive Patient Education 2016 [employer]  

## 2015-10-31 NOTE — Progress Notes (Signed)
Patient's Name: Willie Krueger MRN: [id number] DOB: [birth year] DOS: 10/31/2015  Primary Reason(s) for Visit: Encounter for evaluation before starting a Medication. CC: Shoulder Pain; Arm Pain; Hand Pain; Back Pain; and Leg Pain   HPI:   Willie Krueger is a 59 y.o. year old, male patient, who returns today as an established patient. He has Toe ulcer (Willie Krueger); Nerve root pain; Cervical spinal cord compression (Willie Krueger); Chronic pain associated with significant psychosocial dysfunction; Cardiac murmur; Chronic hepatitis C virus infection (Willie Krueger); HLD (hyperlipidemia); Benign hypertension; ED (erectile dysfunction) of organic origin; Nerve root inflammation; MI (mitral incompetence); Adiposity; Degenerative arthritis of hip; Current smoker; History of decompression of median nerve; Type 2 diabetes mellitus (Willie Krueger); Leg weakness; Chronic pain; Long term current use of opiate analgesic; Long term prescription opiate use; Opiate use; Opiate dependence (Willie Krueger); Encounter for therapeutic drug level monitoring; Chronic neck pain (Right side); Cervical spondylosis; Cervical post-laminectomy syndrome; Chronic low back pain (midline pain) (R>L); Bilateral lower extremity pain; Chronic cervical radicular pain (Bilateral) (R>L); Chronic lumbar radicular pain (Bilateral) (R>L); Substance use disorder Risk: LOW; Failed back surgical syndrome; Epidural fibrosis; Chronic bilateral lower extremity pain; History of carpal tunnel release of both wrists; Grade I anterolisthesis of C7 on T1; and History of spinal stenosis (Before surgery) on his problem list.. His primarily concern today is the Shoulder Pain; Arm Pain; Hand Pain; Back Pain; and Leg Pain    The patient comes in today to be evaluated and started on a medication regimen. According to the patient his primary pain is the lower extremities with the right being worst on the left. In the case of the left lower extremity the pain goes to the top of the foot in what he describes as an L5  dermatomal distribution. He describes his pain as a burning sensation. In the case of the right lower extremity that pain goes down to the knee following a dermatomal pattern. He considers status leg to be worse than the other because of the weakness. He denies ever having had a nerve conduction test of the lower or upper extremities. His second pain is stated (extremities. Here he describes the right side to be worse on the left. In both instances, he has pain in the area of the shoulders with decreased range of motion and weakness which goes all the way down into his hands. However, the pain that he is experiencing the hands does not follow a dermatomal distribution. This pain seems to be more generalized and associated with his prior bilateral carpal tunnel syndrome. He did have both released. His third worst pain is the lower back with most of the pain being in the center but also the right side being worst on the left. He does have a history of a prior back surgery to remove a facet cyst. Is fourth worst pain is his neck. The neck pain is described to be only on the right side of the neck. Today I have reviewed the MRIs of the cervical and lumbar spine and I have ordered a nerve conduction test for both, the upper and lower extremities. The idea is to evaluate this weakness. Today's Pain Score: 8  Pain Type: Chronic pain Pain Location: Back (arms, legs feets shoulder) Pain Orientation: Lower (both l;egs, both arms) Pain Descriptors / Indicators: Aching Pain Frequency: Constant  Date of Last Visit: Date of Last Visit: 08/01/15 Service Provided on Last Visit: Service Provided on Last Visit:  (new patient)  Pharmacotherapy Review:   Side-effects or Adverse reactions:  None reported. Effectiveness: Described as relatively effective, allowing for increase in activities of daily living (ADL). Onset of action: Within expected pharmacological parameters. Duration of action: Within normal limits for  medication. Peak effect: Timing and results are as within normal expected parameters. [place] PMP: Compliant with practice rules and regulations. DST: Compliant with practice rules and regulations. Lab work: No new labs ordered by our practice. Treatment compliance: Compliant. Substance Use Disorder (SUD) Risk Level: Low Planned course of action: Continue therapy as is.  Allergies: Willie Krueger is allergic to cleocin.  Meds: The patient has a current medication list which includes the following prescription(s): acetaminophen, aspirin, vitamin d3, doxycycline, gabapentin, glimepiride, lisinopril, metformin, oxycodone, pravastatin, and gabapentin. Requested Prescriptions   Signed Prescriptions Disp Refills  . oxyCODONE (ROXICODONE) 15 MG immediate release tablet 150 tablet 0    Sig: Take 1 tablet (15 mg total) by mouth 5 (five) times daily as needed for pain.  Marland Kitchen gabapentin (NEURONTIN) 400 MG capsule 240 capsule 0    Sig: Take 2 capsules (800 mg total) by mouth every 6 (six) hours.    ROS: Constitutional: Afebrile, no chills, well hydrated and well nourished Gastrointestinal: negative Musculoskeletal:negative Neurological: negative Behavioral/Psych: negative  PFSH: Medical:  Willie Krueger  has a past medical history of Diabetes mellitus without complication ([place]); Hypertension; Chronic pain; Hyperlipidemia; Osteoarthritis; Heart murmur; Mitral valve regurgitation; and History of spinal stenosis (10/31/2015). Family: family history includes Cancer in his mother; Diabetes in his mother; Heart disease in his father. Surgical:  has past surgical history that includes Carpal tunnel release and Spine surgery. Tobacco:  reports that he has been smoking.  He does not have any smokeless tobacco history on file. Alcohol:  reports that he does not drink alcohol. Drug:  has no drug history on file.  Physical Exam: Vitals:  Today's Vitals   10/31/15 1345 10/31/15 1347  BP: 150/94   Pulse: 83   Temp:  98 F (36.7 C)   Resp: 18   Height: 6\' 2"  (1.88 m)   Weight: 239 lb (108.41 kg)   SpO2: 100%   PainSc: 8  8   Calculated BMI: Body mass index is 30.67 kg/(m^2). General appearance: alert, cooperative, appears stated age, distracted, mild distress and mildly obese Eyes: conjunctivae/corneas clear. PERRL, EOM's intact. Fundi benign. Lungs: No evidence respiratory distress, no audible rales or ronchi and no use of accessory muscles of respiration Neck: no adenopathy, no carotid bruit, no JVD, supple, symmetrical, trachea midline and thyroid not enlarged, symmetric, no tenderness/mass/nodules Back: symmetric, no curvature. ROM normal. No CVA tenderness. Extremities: extremities normal, atraumatic, no cyanosis or edema Pulses: 2+ and symmetric Skin: Skin color, texture, turgor normal. No rashes or lesions Neurologic: Grossly normal    Assessment: Encounter Diagnosis:  Primary Diagnosis: Chronic pain [G89.29]  Plan: Tobias was seen today for shoulder pain, arm pain, hand pain, back pain and leg pain.  Diagnoses and all orders for this visit:  Chronic pain -     oxyCODONE (ROXICODONE) 15 MG immediate release tablet; Take 1 tablet (15 mg total) by mouth 5 (five) times daily as needed for pain. -     COMPLETE METABOLIC PANEL WITH GFR; Future -     C-reactive protein; Future -     Magnesium; Future -     Sedimentation rate; Future -     Vitamin D2,D3 Panel; Future  Long term current use of opiate analgesic -     Drugs of abuse screen w/o alc, rtn urine-sln; Future  Long term prescription opiate use  Opiate use  Uncomplicated opioid dependence ([place])  Encounter for therapeutic drug level monitoring  Chronic neck pain  Cervical spondylosis  Cervical post-laminectomy syndrome  Chronic low back pain  Bilateral lower extremity pain  Chronic radicular cervical pain -     NCV with EMG(electromyography); Future  Chronic radicular lumbar pain -     NCV with  EMG(electromyography); Future  Substance use disorder Risk: LOW  Failed back surgical syndrome  Epidural fibrosis  Chronic pain of lower extremity, unspecified laterality  History of carpal tunnel release of both wrists  Grade I anterolisthesis of C7 on T1  History of spinal stenosis (Before surgery)  Other orders -     gabapentin (NEURONTIN) 400 MG capsule; Take 2 capsules (800 mg total) by mouth every 6 (six) hours.     Patient Instructions  Smoking Cessation, Tips for Success If you are ready to quit smoking, congratulations! You have chosen to help yourself be healthier. Cigarettes bring nicotine, tar, carbon monoxide, and other irritants into your body. Your lungs, heart, and blood vessels will be able to work better without these poisons. There are many different ways to quit smoking. Nicotine gum, nicotine patches, a nicotine inhaler, or nicotine nasal spray can help with physical craving. Hypnosis, support groups, and medicines help break the habit of smoking. WHAT THINGS CAN I DO TO MAKE QUITTING EASIER?  Here are some tips to help you quit for good:  Pick a date when you will quit smoking completely. Tell all of your friends and family about your plan to quit on that date.  Do not try to slowly cut down on the number of cigarettes you are smoking. Pick a quit date and quit smoking completely starting on that day.  Throw away all cigarettes.   Clean and remove all ashtrays from your home, work, and car.  On a card, write down your reasons for quitting. Carry the card with you and read it when you get the urge to smoke.  Cleanse your body of nicotine. Drink enough water and fluids to keep your urine clear or pale yellow. Do this after quitting to flush the nicotine from your body.  Learn to predict your moods. Do not let a bad situation be your excuse to have a cigarette. Some situations in your life might tempt you into wanting a cigarette.  Never have "just one"  cigarette. It leads to wanting another and another. Remind yourself of your decision to quit.  Change habits associated with smoking. If you smoked while driving or when feeling stressed, try other activities to replace smoking. Stand up when drinking your coffee. Brush your teeth after eating. Sit in a different chair when you read the paper. Avoid alcohol while trying to quit, and try to drink fewer caffeinated beverages. Alcohol and caffeine may urge you to smoke.  Avoid foods and drinks that can trigger a desire to smoke, such as sugary or spicy foods and alcohol.  Ask people who smoke not to smoke around you.  Have something planned to do right after eating or having a cup of coffee. For example, plan to take a walk or exercise.  Try a relaxation exercise to calm you down and decrease your stress. Remember, you may be tense and nervous for the first 2 weeks after you quit, but this will pass.  Find new activities to keep your hands busy. Play with a pen, coin, or rubber band. Doodle or draw things on  paper.  Brush your teeth right after eating. This will help cut down on the craving for the taste of tobacco after meals. You can also try mouthwash.   Use oral substitutes in place of cigarettes. Try using lemon drops, carrots, cinnamon sticks, or chewing gum. Keep them handy so they are available when you have the urge to smoke.  When you have the urge to smoke, try deep breathing.  Designate your home as a nonsmoking area.  If you are a heavy smoker, ask your health care provider about a prescription for nicotine chewing gum. It can ease your withdrawal from nicotine.  Reward yourself. Set aside the cigarette money you save and buy yourself something nice.  Look for support from others. Join a support group or smoking cessation program. Ask someone at home or at work to help you with your plan to quit smoking.  Always ask yourself, "Do I need this cigarette or is this just a reflex?"  Tell yourself, "Today, I choose not to smoke," or "I do not want to smoke." You are reminding yourself of your decision to quit.  Do not replace cigarette smoking with electronic cigarettes (commonly called e-cigarettes). The safety of e-cigarettes is unknown, and some may contain harmful chemicals.  If you relapse, do not give up! Plan ahead and think about what you will do the next time you get the urge to smoke. HOW WILL I FEEL WHEN I QUIT SMOKING? You may have symptoms of withdrawal because your body is used to nicotine (the addictive substance in cigarettes). You may crave cigarettes, be irritable, feel very hungry, cough often, get headaches, or have difficulty concentrating. The withdrawal symptoms are only temporary. They are strongest when you first quit but will go away within 10-14 days. When withdrawal symptoms occur, stay in control. Think about your reasons for quitting. Remind yourself that these are signs that your body is healing and getting used to being without cigarettes. Remember that withdrawal symptoms are easier to treat than the major diseases that smoking can cause.  Even after the withdrawal is over, expect periodic urges to smoke. However, these cravings are generally short lived and will go away whether you smoke or not. Do not smoke! WHAT RESOURCES ARE AVAILABLE TO HELP ME QUIT SMOKING? Your health care provider can direct you to community resources or hospitals for support, which may include:  Group support.  Education.  Hypnosis.  Therapy.   This information is not intended to replace advice given to you by your health care provider. Make sure you discuss any questions you have with your health care provider.   Document Released: 09/12/2004 Document Revised: 01/05/2015 Document Reviewed: 06/02/2013 Elsevier Interactive Patient Education 2016 [employer].    Medications discontinued today:  Medications Discontinued During This Encounter  Medication Reason   . cephALEXin (KEFLEX) 500 MG capsule Error  . cephALEXin (KEFLEX) 500 MG capsule Error  . clindamycin (CLEOCIN) 300 MG capsule Error  . oxyCODONE (ROXICODONE) 15 MG immediate release tablet Reorder   Medications administered today:  Mr. Villagran had no medications administered during this visit.  Primary Care Physician: Juanell Fairly, MD Location: [facility] Note by: Kathlen Brunswick. Dossie Arbour, M.D, DABA, DABAPM, DABPM, DABIPP, FIPP

## 2015-11-05 ENCOUNTER — Ambulatory Visit: Payer: 59 | Admitting: Podiatry

## 2015-11-07 ENCOUNTER — Telehealth: Payer: Self-pay | Admitting: *Deleted

## 2015-11-07 MED ORDER — DOXYCYCLINE HYCLATE 100 MG PO TABS: 100.0000 mg | ORAL_TABLET | Freq: Two times a day (BID) | ORAL | Status: DC

## 2015-11-07 NOTE — Telephone Encounter (Signed)
Pam request prior authorization for MRI.  Prior authorization was checked by Marylou Mccoy, and is [phone].

## 2015-11-07 NOTE — Telephone Encounter (Signed)
Pt states he has an appt after 11/16/2015 due to a MRI and has run out of the antibiotic, and request a refill.  Dr. Berton Lan refill as previously.  Left message informing pt of refill.

## 2015-11-08 LAB — TOXASSURE SELECT 13 (MW), URINE: PDF: 0

## 2015-11-09 ENCOUNTER — Ambulatory Visit
Admission: RE | Admit: 2015-11-09 | Discharge: 2015-11-09 | Disposition: A | Discharge status: Home / Self Care | Payer: 59 | Source: Ambulatory Visit | Attending: Podiatry | Admitting: Podiatry

## 2015-11-09 DIAGNOSIS — M7989 Other specified soft tissue disorders: Secondary | ICD-10-CM | POA: Diagnosis not present

## 2015-11-09 DIAGNOSIS — M899 Disorder of bone, unspecified: Secondary | ICD-10-CM | POA: Insufficient documentation

## 2015-11-09 DIAGNOSIS — M86171 Other acute osteomyelitis, right ankle and foot: Secondary | ICD-10-CM

## 2015-11-16 ENCOUNTER — Encounter: Payer: Self-pay | Admitting: Podiatry

## 2015-11-16 ENCOUNTER — Telehealth: Payer: Self-pay | Admitting: *Deleted

## 2015-11-16 ENCOUNTER — Ambulatory Visit (INDEPENDENT_AMBULATORY_CARE_PROVIDER_SITE_OTHER): Payer: 59 | Admitting: Podiatry

## 2015-11-16 VITALS — BP 169/95 | HR 96 | Resp 18

## 2015-11-16 DIAGNOSIS — M86171 Other acute osteomyelitis, right ankle and foot: Secondary | ICD-10-CM | POA: Diagnosis not present

## 2015-11-16 LAB — C-REACTIVE PROTEIN: CRP: 1.8 mg/dL — ABNORMAL HIGH (ref <0.60)

## 2015-11-16 NOTE — Telephone Encounter (Signed)
Revision: orders faxed to [facility] at [facility] and Infectious Disease [phone].

## 2015-11-16 NOTE — Telephone Encounter (Signed)
Magda Paganini states C-Reactive Protein ordered on pt is for Spinal fluid, please change to blood.  I called an told they I would change to blood test.  Faxed orders to [facility] at [facility] and [facility].

## 2015-11-17 LAB — CBC WITH DIFFERENTIAL/PLATELET
BASOS PCT: 1 % (ref 0–1)
Basophils Absolute: 0.1 10*3/uL (ref 0.0–0.1)
EOS ABS: 0.1 10*3/uL (ref 0.0–0.7)
EOS PCT: 1 % (ref 0–5)
HCT: 41.3 % (ref 39.0–52.0)
Hemoglobin: 13.6 g/dL (ref 13.0–17.0)
LYMPHS ABS: 2 10*3/uL (ref 0.7–4.0)
Lymphocytes Relative: 26 % (ref 12–46)
MCH: 31.1 pg (ref 26.0–34.0)
MCHC: 32.9 g/dL (ref 30.0–36.0)
MCV: 94.5 fL (ref 78.0–100.0)
MONO ABS: 0.6 10*3/uL (ref 0.1–1.0)
MONOS PCT: 8 % (ref 3–12)
MPV: 10.7 fL (ref 8.6–12.4)
NEUTROS PCT: 64 % (ref 43–77)
Neutro Abs: 4.8 10*3/uL (ref 1.7–7.7)
PLATELETS: 199 10*3/uL (ref 150–400)
RBC: 4.37 MIL/uL (ref 4.22–5.81)
RDW: 13.9 % (ref 11.5–15.5)
WBC: 7.5 10*3/uL (ref 4.0–10.5)

## 2015-11-17 LAB — BASIC METABOLIC PANEL
BUN: 16 mg/dL (ref 7–25)
CHLORIDE: 104 mmol/L (ref 98–110)
CO2: 22 mmol/L (ref 20–31)
CREATININE: 0.85 mg/dL (ref 0.70–1.25)
Calcium: 9.3 mg/dL (ref 8.6–10.3)
Glucose, Bld: 144 mg/dL — ABNORMAL HIGH (ref 65–99)
POTASSIUM: 4.6 mmol/L (ref 3.5–5.3)
Sodium: 137 mmol/L (ref 135–146)

## 2015-11-17 LAB — SEDIMENTATION RATE: SED RATE: 6 mm/h (ref 0–20)

## 2015-11-17 LAB — HEMOGLOBIN A1C
HEMOGLOBIN A1C: 8 % — AB (ref <5.7)
MEAN PLASMA GLUCOSE: 183 mg/dL — AB (ref <117)

## 2015-11-18 ENCOUNTER — Encounter: Payer: Self-pay | Admitting: Podiatry

## 2015-11-18 NOTE — Progress Notes (Signed)
Patient ID: NEDDIE DINARDI, male   DOB: 22-Nov-1956, 59 y.o.   MRN: [id number]  Subjective: Patient was infiltrated for follow-up evaluation of right hallux ulceration. He continues to change the dressing daily with Silvadene daily. He does state that the right foot remains swollen but has decreased. Denies any pus or drainage, or any surrounding redness or red streaks. He denies any systemic complaints such as fevers, chills, nausea, vomiting. He is also requesting nail debridement time as they're elongated causing pressure in his shoes. Denies any redness or drainage in the nail sites. No other complaints at this time.  Objective: AAO 3, NAD; presents in surgical shoe.  DP/PT pulses 2/4, CRT less than 3 seconds Protective sensation decreased with Simms Weinstein monofilament There is continuation of ulceration the distal aspect of the right hallux which appears be superficial with a granular wound base. There is significant hyperkeratotic tissue of the distal aspect of the hallux. After debridement the wound measures about a 1.5 x 1.5 cm there is no probing to bone, undermining, tunneling. There is mild edema to the hallux however does appear to be somewhat improved compared to last appointment. There is no increase in warmth or erythema to the foot. No other open lesions or pre-ulcerative lesions. The does appear to be some calf swelling however there is no pain with compression, erythema, increase in warmth.  Assessment: 59 year old male with continuation right hallux ulceration with possible osteomyelitis  Plan: -Treatment options discussed including all alternatives, risks, and complications -MRI results were discussed the patient which reveal edema in the distal phalanx of the grade 2 without the bone destruction. This could be due to hyperemia due to the osseous irregularity tuft on x-ray and this finding, this possibly represents osteomyelitis. Diffuse edema. -Given the MRI findings and the  continued swelling to the foot recommended she continue with antibiotics. Also discussed treatment options and I will refer him to infectious disease as well. -Continue with daily dressing changes -Today the wound was debrided without complications. -Continue surgical shoe. -Venous duplex  -Ordered blood work.  -Discussed the possibly of amputation in the future if needed or if wound/OM progresses.  -Monitor for any clinical signs or symptoms of infection and directed to call the office immediately should any occur or go to the ER. -Follow-up in 2 weeks or sooner if any problems arise. In the meantime, encouraged to call the office with any questions, concerns, change in symptoms.   Celesta Gentile, DPM

## 2015-11-19 ENCOUNTER — Other Ambulatory Visit: Payer: Self-pay | Admitting: Podiatry

## 2015-11-19 DIAGNOSIS — M86171 Other acute osteomyelitis, right ankle and foot: Secondary | ICD-10-CM

## 2015-11-19 DIAGNOSIS — M7989 Other specified soft tissue disorders: Secondary | ICD-10-CM

## 2015-11-21 ENCOUNTER — Inpatient Hospital Stay (HOSPITAL_COMMUNITY): Admission: RE | Admit: 2015-11-21 | Payer: 59 | Source: Ambulatory Visit

## 2015-11-21 ENCOUNTER — Telehealth: Payer: Self-pay | Admitting: *Deleted

## 2015-11-21 DIAGNOSIS — M86171 Other acute osteomyelitis, right ankle and foot: Secondary | ICD-10-CM

## 2015-11-21 DIAGNOSIS — L97521 Non-pressure chronic ulcer of other part of left foot limited to breakdown of skin: Secondary | ICD-10-CM

## 2015-11-21 MED ORDER — DOXYCYCLINE HYCLATE 100 MG PO TABS: 100.0000 mg | ORAL_TABLET | Freq: Two times a day (BID) | ORAL | Status: DC

## 2015-11-21 NOTE — Telephone Encounter (Signed)
The MRI also only shows possibly early signs of infection. If we had the MRI done before, I do not think it would have changed anything. As the wound did not progress is when I ordered the MRI. Thanks for the refill. Please make sure he gets into ID

## 2015-11-21 NOTE — Telephone Encounter (Addendum)
Pt states he hasn't received the antibiotic that Dr. Jacqualyn Posey ordered last Friday and no one has called him from the infection referral.  Informed pt I did see in the last office notes Dr. Jacqualyn Posey had wanted him to continue the Doxycycline and I would call that in and that Infectious Disease would review his chart and call to set up an appt.  11/27/2015 I spoke with Mateo Flow - Infectious Disease and she said pt was in the work que, and she would take his referral and notes to the doctors and should be able to contact pt within the week to set up an appt.  I called pt to inform him of the referral status and to get the name of his primary doctor, left message with his referral up date and request callback with his primary doctor's information.  Faxed pt's last bloodwork.  12/07/2015-DrJacqualyn Posey ordered pt to Wound Care in [place].  Orders in [place] for [place] [phone].

## 2015-11-26 ENCOUNTER — Ambulatory Visit (INDEPENDENT_AMBULATORY_CARE_PROVIDER_SITE_OTHER): Payer: 59 | Admitting: Podiatry

## 2015-11-26 ENCOUNTER — Encounter: Payer: Self-pay | Admitting: Podiatry

## 2015-11-26 ENCOUNTER — Ambulatory Visit: Payer: 59 | Admitting: Podiatry

## 2015-11-26 VITALS — BP 170/105 | HR 85 | Resp 18

## 2015-11-26 DIAGNOSIS — L97521 Non-pressure chronic ulcer of other part of left foot limited to breakdown of skin: Secondary | ICD-10-CM

## 2015-11-26 DIAGNOSIS — M86171 Other acute osteomyelitis, right ankle and foot: Secondary | ICD-10-CM

## 2015-11-26 MED ORDER — DOXYCYCLINE HYCLATE 100 MG PO TABS: 100.0000 mg | ORAL_TABLET | Freq: Two times a day (BID) | ORAL | Status: DC

## 2015-11-26 MED ORDER — CIPROFLOXACIN HCL 500 MG PO TABS: 500.0000 mg | ORAL_TABLET | Freq: Two times a day (BID) | ORAL | Status: DC

## 2015-11-27 ENCOUNTER — Encounter: Payer: Self-pay | Admitting: Podiatry

## 2015-11-27 NOTE — Progress Notes (Addendum)
Patient ID: Willie Krueger, male   DOB: 26-Jun-1956, 59 y.o.   MRN: [id number]  Subjective: Patient presents to the office today for follow-up evaluation of right hallux ulceration. He continues to change the dressing daily with Silvadene daily. He does state that the right foot remains swollen but continues to decrease. Denies any pus or drainage, or any surrounding redness or red streaks. He has continue antibiotics which she believes helps. He is currently on doxycycline. He has not heard back about his ID appointment. He denies any systemic complaints such as fevers, chills, nausea, vomiting. Denies any redness or drainage in the nail sites. No other complaints at this time.  Objective: AAO 3, NAD; presents in surgical shoe.  DP/PT pulses 2/4, CRT less than 3 seconds Protective sensation decreased with Simms Weinstein monofilament There is continuation of ulceration the distal aspect of the right hallux which appears be superficial with a granular wound base. There is significant hyperkeratotic tissue of the distal aspect of the hallux. After debridement the wound measures about a 1 x 0.8 cm and  there is no probing to bone, undermining, tunneling. The wound is superficial. There is mild edema to the hallux however does appear to be somewhat improved compared to last appointment. There is no increase in warmth or erythema to the foot. No other open lesions or pre-ulcerative lesions. The does appear to be some calf swelling however there is no pain with compression, erythema, increase in warmth.  Assessment: 59 year old male with continuation right hallux ulceration with possible osteomyelitis  Plan: -Treatment options discussed including all alternatives, risks, and complications -All the hyperkeratotic tissue on the ulceration of the distal aspect of the hallux was debrided without complications to reveal underlying small, superficial wound. There is no probing to bone the wound does not probe to  bone since I haven't seen him. At this appointment he had several questions regarding to the MRI findings. I had a long discussion with the patient again today in regards these findings and the clinical course of treatment so far. -Continue with daily dressing changes -Continue surgical shoe. -Venous duplex. He is scheduled this week. -Discussed blood work with the patient. A1c was 8. He states he does not take a medication regularly. Follow-up with PCP.  -Awaiting infectious disease consult. -Continue doxycycline. We'll add ciprofloxacin.  -Monitor for any clinical signs or symptoms of infection and directed to call the office immediately should any occur or go to the ER. -Follow-up in 2 weeks or sooner if any problems arise. In the meantime, encouraged to call the office with any questions, concerns, change in symptoms.   Celesta Gentile, DPM

## 2015-11-28 ENCOUNTER — Encounter (HOSPITAL_COMMUNITY): Payer: 59

## 2015-11-28 ENCOUNTER — Encounter: Payer: Self-pay | Admitting: Pain Medicine

## 2015-11-28 NOTE — Telephone Encounter (Signed)
-----   Message from Trula Slade, DPM sent at 11/27/2015  7:14 AM EST ----- Also, can you fax his blood work to the PCP

## 2015-11-30 ENCOUNTER — Ambulatory Visit (HOSPITAL_COMMUNITY)
Admission: RE | Admit: 2015-11-30 | Discharge: 2015-11-30 | Disposition: A | Discharge status: Home / Self Care | Payer: 59 | Source: Ambulatory Visit | Attending: Cardiology | Admitting: Cardiology

## 2015-11-30 DIAGNOSIS — M86171 Other acute osteomyelitis, right ankle and foot: Secondary | ICD-10-CM | POA: Diagnosis not present

## 2015-11-30 DIAGNOSIS — M7989 Other specified soft tissue disorders: Secondary | ICD-10-CM

## 2015-11-30 DIAGNOSIS — I10 Essential (primary) hypertension: Secondary | ICD-10-CM | POA: Insufficient documentation

## 2015-11-30 DIAGNOSIS — E119 Type 2 diabetes mellitus without complications: Secondary | ICD-10-CM | POA: Insufficient documentation

## 2015-11-30 DIAGNOSIS — E785 Hyperlipidemia, unspecified: Secondary | ICD-10-CM | POA: Diagnosis not present

## 2015-12-03 ENCOUNTER — Other Ambulatory Visit: Payer: Self-pay | Admitting: Pain Medicine

## 2015-12-03 ENCOUNTER — Encounter: Payer: Self-pay | Admitting: Pain Medicine

## 2015-12-03 ENCOUNTER — Ambulatory Visit: Payer: 59 | Attending: Pain Medicine | Admitting: Pain Medicine

## 2015-12-03 VITALS — BP 135/77 | HR 72 | Temp 98.6°F | Resp 20 | Ht 74.0 in | Wt 218.0 lb

## 2015-12-03 DIAGNOSIS — M5416 Radiculopathy, lumbar region: Secondary | ICD-10-CM | POA: Diagnosis not present

## 2015-12-03 DIAGNOSIS — M79606 Pain in leg, unspecified: Secondary | ICD-10-CM | POA: Diagnosis not present

## 2015-12-03 DIAGNOSIS — G8929 Other chronic pain: Secondary | ICD-10-CM | POA: Diagnosis not present

## 2015-12-03 DIAGNOSIS — M961 Postlaminectomy syndrome, not elsewhere classified: Secondary | ICD-10-CM

## 2015-12-03 DIAGNOSIS — M5412 Radiculopathy, cervical region: Secondary | ICD-10-CM

## 2015-12-03 DIAGNOSIS — M539 Dorsopathy, unspecified: Secondary | ICD-10-CM

## 2015-12-03 DIAGNOSIS — Z5181 Encounter for therapeutic drug level monitoring: Secondary | ICD-10-CM

## 2015-12-03 DIAGNOSIS — M549 Dorsalgia, unspecified: Secondary | ICD-10-CM | POA: Diagnosis present

## 2015-12-03 DIAGNOSIS — F1190 Opioid use, unspecified, uncomplicated: Secondary | ICD-10-CM

## 2015-12-03 DIAGNOSIS — B182 Chronic viral hepatitis C: Secondary | ICD-10-CM | POA: Insufficient documentation

## 2015-12-03 DIAGNOSIS — Z79899 Other long term (current) drug therapy: Secondary | ICD-10-CM

## 2015-12-03 DIAGNOSIS — M545 Low back pain, unspecified: Secondary | ICD-10-CM

## 2015-12-03 DIAGNOSIS — M47812 Spondylosis without myelopathy or radiculopathy, cervical region: Secondary | ICD-10-CM | POA: Diagnosis not present

## 2015-12-03 DIAGNOSIS — G894 Chronic pain syndrome: Secondary | ICD-10-CM

## 2015-12-03 DIAGNOSIS — M4316 Spondylolisthesis, lumbar region: Secondary | ICD-10-CM | POA: Diagnosis not present

## 2015-12-03 DIAGNOSIS — M542 Cervicalgia: Secondary | ICD-10-CM | POA: Diagnosis not present

## 2015-12-03 DIAGNOSIS — M4310 Spondylolisthesis, site unspecified: Secondary | ICD-10-CM

## 2015-12-03 DIAGNOSIS — E785 Hyperlipidemia, unspecified: Secondary | ICD-10-CM | POA: Diagnosis not present

## 2015-12-03 DIAGNOSIS — R7982 Elevated C-reactive protein (CRP): Secondary | ICD-10-CM | POA: Insufficient documentation

## 2015-12-03 DIAGNOSIS — Q762 Congenital spondylolisthesis: Secondary | ICD-10-CM

## 2015-12-03 DIAGNOSIS — F1120 Opioid dependence, uncomplicated: Secondary | ICD-10-CM

## 2015-12-03 DIAGNOSIS — Z9889 Other specified postprocedural states: Secondary | ICD-10-CM | POA: Insufficient documentation

## 2015-12-03 DIAGNOSIS — E119 Type 2 diabetes mellitus without complications: Secondary | ICD-10-CM | POA: Diagnosis not present

## 2015-12-03 DIAGNOSIS — M25519 Pain in unspecified shoulder: Secondary | ICD-10-CM | POA: Diagnosis present

## 2015-12-03 DIAGNOSIS — Z79891 Long term (current) use of opiate analgesic: Secondary | ICD-10-CM

## 2015-12-03 DIAGNOSIS — M79604 Pain in right leg: Secondary | ICD-10-CM

## 2015-12-03 DIAGNOSIS — M79605 Pain in left leg: Secondary | ICD-10-CM

## 2015-12-03 DIAGNOSIS — G9520 Unspecified cord compression: Secondary | ICD-10-CM

## 2015-12-03 DIAGNOSIS — Z7189 Other specified counseling: Secondary | ICD-10-CM

## 2015-12-03 MED ORDER — OXYCODONE HCL 15 MG PO TABS: 15.0000 mg | ORAL_TABLET | Freq: Every day | ORAL | Status: DC | PRN

## 2015-12-03 NOTE — Progress Notes (Signed)
Patient's Name: Willie Krueger MRN: [id number] DOB: [birth year] DOS: 12/03/2015  Primary Reason(s) for Visit: Encounter for Medication Management CC: Shoulder Pain; Arm Pain; Hand Pain; Back Pain; Foot Pain; and Leg Pain   HPI:   Willie Krueger is a 59 y.o. year old, male patient, who returns today as an established patient. He has Toe ulcer ([place]); Nerve root pain; Cervical spinal cord compression ([place]); Chronic pain associated with significant psychosocial dysfunction; Cardiac murmur; Chronic hepatitis C virus infection ([place]); HLD (hyperlipidemia); Benign hypertension; ED (erectile dysfunction) of organic origin; Nerve root inflammation; MI (mitral incompetence); Adiposity; Degenerative arthritis of hip; Current smoker; History of decompression of median nerve; Type 2 diabetes mellitus ([place]); Leg weakness; Chronic pain; Long term current use of opiate analgesic; Long term prescription opiate use; Opiate use; Opiate dependence ([place]); Encounter for therapeutic drug level monitoring; Chronic neck pain (Right side); Cervical spondylosis; Cervical post-laminectomy syndrome; Chronic low back pain (Midline pain) (R>L) (Primary Pain); Bilateral lower extremity pain; Chronic cervical radicular pain (Bilateral) (R>L); Chronic lumbar radicular pain (Bilateral) (R>L); Substance use disorder Risk: LOW; Failed back surgical syndrome; Epidural fibrosis; Chronic bilateral lower extremity pain; History of carpal tunnel release of both wrists; Grade I anterolisthesis of C7 on T1; History of spinal stenosis (Before surgery); Encounter for chronic pain management; and Elevated C-reactive protein (CRP) on his problem list.. His primarily concern today is the Shoulder Pain; Arm Pain; Hand Pain; Back Pain; Foot Pain; and Leg Pain     The patient comes into the clinic today for medication management. He indicates that he is developing tolerance to the medication is no longer helping him. I took time to explain to him about the issue  of opioid tolerance and how we manage that with "drug holidays". I also explained to him that I will never increase his medication a further and as soon as I told them this he changed the line of questioning as to why was he coming here if we were not going to take care of his pain. He wants to know why he is coming in just to have [place] to the same pain that Dr. Petra Kuba was doing for him. I tried to explain to him that we have experience in chronic pain management and because of this we are well aware of the fact that he cannot continue to increase the dose and we have to face the issues of tolerance early so that the patients do not end up taking megadoses of opioids. I tried to explain to him that no matter how high anybody increases his dose to, he will develop tolerance to that higher dose. I also explained to him that I do not believe in opioid rotation as that tolerance is shared between all members of the opioid family. He did not like the fact that I would not be increasing the dose or change in medication around and therefore he has commented that he needs to go back to his primary care physician to try to figure out why he has to come in here to have [place] do exactly the same thing that Dr. Juanell Fairly was doing before.  Today's Pain Score: 8 , clinically he looks more like a 2-3/10. Reported level of pain is incompatible with clinical obrservations. This may be secondary to a possible lack of understanding on how the pain scale works. Pain Type: Chronic pain Pain Location: Leg (back, shoulders, arms, hands, feet, low back ) Pain Descriptors / Indicators: Aching, Sharp Pain Frequency: Constant  Date of Last Visit: 10/31/15 Service Provided on Last Visit: Med Refill  Pharmacotherapy Review:   Side-effects or Adverse reactions: None reported. Effectiveness: Described as relatively effective, allowing for increase in activities of daily living (ADL). Onset of action: Within expected pharmacological  parameters. Duration of action: Within normal limits for medication. Peak effect: Timing and results are as within normal expected parameters. [place] PMP: Compliant with practice rules and regulations. UDS Results: Last UDS done on 10/31/2015 was within normal limits. UDS Interpretation: Patient appears to be compliant with practice rules and regulations. Medication Assessment Form: Reviewed. Patient indicates being compliant with therapy Treatment compliance: Compliant. Substance Use Disorder (SUD) Risk Level: Low Pharmacologic Plan: Continue therapy as is.  Last Available Lab Work: Telephone on 11/16/2015  Component Date Value Ref Range Status  . CRP 11/16/2015 1.8* <0.60 mg/dL Final  Office Visit on 11/16/2015  Component Date Value Ref Range Status  . WBC 11/16/2015 7.5  4.0 - 10.5 K/uL Final  . RBC 11/16/2015 4.37  4.22 - 5.81 MIL/uL Final  . Hemoglobin 11/16/2015 13.6  13.0 - 17.0 g/dL Final  . HCT 11/16/2015 41.3  39.0 - 52.0 % Final  . MCV 11/16/2015 94.5  78.0 - 100.0 fL Final  . MCH 11/16/2015 31.1  26.0 - 34.0 pg Final  . MCHC 11/16/2015 32.9  30.0 - 36.0 g/dL Final  . RDW 11/16/2015 13.9  11.5 - 15.5 % Final  . Platelets 11/16/2015 199  150 - 400 K/uL Final  . MPV 11/16/2015 10.7  8.6 - 12.4 fL Final  . Neutrophils Relative % 11/16/2015 64  43 - 77 % Final  . Neutro Abs 11/16/2015 4.8  1.7 - 7.7 K/uL Final  . Lymphocytes Relative 11/16/2015 26  12 - 46 % Final  . Lymphs Abs 11/16/2015 2.0  0.7 - 4.0 K/uL Final  . Monocytes Relative 11/16/2015 8  3 - 12 % Final  . Monocytes Absolute 11/16/2015 0.6  0.1 - 1.0 K/uL Final  . Eosinophils Relative 11/16/2015 1  0 - 5 % Final  . Eosinophils Absolute 11/16/2015 0.1  0.0 - 0.7 K/uL Final  . Basophils Relative 11/16/2015 1  0 - 1 % Final  . Basophils Absolute 11/16/2015 0.1  0.0 - 0.1 K/uL Final  . Smear Review 11/16/2015 Criteria for review not met   Final  . Sed Rate 11/16/2015 6  0 - 20 mm/hr Final  . Sodium 11/16/2015 137  135  - 146 mmol/L Final  . Potassium 11/16/2015 4.6  3.5 - 5.3 mmol/L Final  . Chloride 11/16/2015 104  98 - 110 mmol/L Final  . CO2 11/16/2015 22  20 - 31 mmol/L Final  . Glucose, Bld 11/16/2015 144* 65 - 99 mg/dL Final  . BUN 11/16/2015 16  7 - 25 mg/dL Final  . Creat 11/16/2015 0.85  0.70 - 1.25 mg/dL Final  . Calcium 11/16/2015 9.3  8.6 - 10.3 mg/dL Final  . Hgb A1c MFr Bld 11/16/2015 8.0* <5.7 % Final  . Mean Plasma Glucose 11/16/2015 183* <117 mg/dL Final  Orders Only on 10/31/2015  Component Date Value Ref Range Status  . Report Summary 10/31/2015 FINAL   Final  . PDF 10/31/2015 .   Final   Allergies: Willie Krueger is allergic to cleocin.  Meds: The patient has a current medication list which includes the following prescription(s): acetaminophen, aspirin, vitamin d3, ciprofloxacin, furosemide, gabapentin, glimepiride, lisinopril, metformin, oxycodone, pravastatin, and oxycodone. Requested Prescriptions   Signed Prescriptions Disp Refills  . oxyCODONE (ROXICODONE)  15 MG immediate release tablet 150 tablet 0    Sig: Take 1 tablet (15 mg total) by mouth 5 (five) times daily as needed for pain.  Marland Kitchen oxyCODONE (ROXICODONE) 15 MG immediate release tablet 150 tablet 0    Sig: Take 1 tablet (15 mg total) by mouth 5 (five) times daily as needed for pain.    ROS: Constitutional: Afebrile, no chills, well hydrated and well nourished Gastrointestinal: negative Musculoskeletal:negative Neurological: negative Behavioral/Psych: negative  PFSH: Medical:  Willie Krueger  has a past medical history of Diabetes mellitus without complication ([place]); Hypertension; Chronic pain; Hyperlipidemia; Osteoarthritis; Heart murmur; Mitral valve regurgitation; and History of spinal stenosis (10/31/2015). Family: family history includes Cancer in his mother; Diabetes in his mother; Heart disease in his father. Surgical:  has past surgical history that includes Carpal tunnel release and Spine surgery. Tobacco:  reports  that he has been smoking.  He does not have any smokeless tobacco history on file. Alcohol:  reports that he does not drink alcohol. Drug:  reports that he does not use illicit drugs.  Physical Exam: Vitals:  Today's Vitals   12/03/15 1150 12/03/15 1152  BP:  135/77  Pulse: 72   Temp: 98.6 F (37 C)   Resp: 20   Height: _0  (1.88 m)   Weight: 218 lb (98.884 kg)   SpO2: 99%   PainSc: 8  8   PainLoc: Leg   Calculated BMI: Body mass index is 27.98 kg/(m^2). General appearance: alert, cooperative, appears stated age and no distress Eyes: conjunctivae/corneas clear. PERRL, EOM's intact. Fundi benign. Lungs: No evidence respiratory distress, no audible rales or ronchi and no use of accessory muscles of respiration Neck: no adenopathy, no carotid bruit, no JVD, supple, symmetrical, trachea midline and thyroid not enlarged, symmetric, no tenderness/mass/nodules Lumbar Spine Palpable Trigger Points: Non-contributory to today's visit Lumbar Hyperextension and rotation: Non-contributory to today's visit Patrick's Maneuver: Non-contributory to today's visit Lower Extremities ROM: Non-contributory to today's visit Gait: Non-contributory to today's visit PT & DP Pulses: Non-contributory to today's visit Skin: Skin color, texture, turgor normal. No rashes or lesions Neurologic: Gait: Antalgic    Assessment: Encounter Diagnosis:  Primary Diagnosis: Chronic pain [G89.29]  Plan: [place] was seen today for shoulder pain, arm pain, hand pain, back pain, foot pain and leg pain.  Diagnoses and all orders for this visit:  Chronic pain -     oxyCODONE (ROXICODONE) 15 MG immediate release tablet; Take 1 tablet (15 mg total) by mouth 5 (five) times daily as needed for pain. -     oxyCODONE (ROXICODONE) 15 MG immediate release tablet; Take 1 tablet (15 mg total) by mouth 5 (five) times daily as needed for pain.  Chronic neck pain (Right side)  Chronic lumbar radicular pain (Bilateral)  (R>L)  Chronic low back pain (midline pain) (R>L)  Chronic cervical radicular pain (Bilateral) (R>L)  Chronic pain of lower extremity, unspecified laterality  Cervical spondylosis  Cervical spinal cord compression (HCC)  Cervical post-laminectomy syndrome  Bilateral lower extremity pain  Chronic pain associated with significant psychosocial dysfunction  Grade I anterolisthesis of C7 on T1  Encounter for chronic pain management  Elevated C-reactive protein (CRP)  Encounter for therapeutic drug level monitoring  Long term current use of opiate analgesic  Long term prescription opiate use  Uncomplicated opioid dependence ([place])  Opiate use  Failed back surgical syndrome     Patient Instructions  Smoking Cessation, Tips for Success If you are ready to quit smoking, congratulations! You  have chosen to help yourself be healthier. Cigarettes bring nicotine, tar, carbon monoxide, and other irritants into your body. Your lungs, heart, and blood vessels will be able to work better without these poisons. There are many different ways to quit smoking. Nicotine gum, nicotine patches, a nicotine inhaler, or nicotine nasal spray can help with physical craving. Hypnosis, support groups, and medicines help break the habit of smoking. WHAT THINGS CAN I DO TO MAKE QUITTING EASIER?  Here are some tips to help you quit for good:  Pick a date when you will quit smoking completely. Tell all of your friends and family about your plan to quit on that date.  Do not try to slowly cut down on the number of cigarettes you are smoking. Pick a quit date and quit smoking completely starting on that day.  Throw away all cigarettes.   Clean and remove all ashtrays from your home, work, and car.  On a card, write down your reasons for quitting. Carry the card with you and read it when you get the urge to smoke.  Cleanse your body of nicotine. Drink enough water and fluids to keep your urine clear  or pale yellow. Do this after quitting to flush the nicotine from your body.  Learn to predict your moods. Do not let a bad situation be your excuse to have a cigarette. Some situations in your life might tempt you into wanting a cigarette.  Never have "just one" cigarette. It leads to wanting another and another. Remind yourself of your decision to quit.  Change habits associated with smoking. If you smoked while driving or when feeling stressed, try other activities to replace smoking. Stand up when drinking your coffee. Brush your teeth after eating. Sit in a different chair when you read the paper. Avoid alcohol while trying to quit, and try to drink fewer caffeinated beverages. Alcohol and caffeine may urge you to smoke.  Avoid foods and drinks that can trigger a desire to smoke, such as sugary or spicy foods and alcohol.  Ask people who smoke not to smoke around you.  Have something planned to do right after eating or having a cup of coffee. For example, plan to take a walk or exercise.  Try a relaxation exercise to calm you down and decrease your stress. Remember, you may be tense and nervous for the first 2 weeks after you quit, but this will pass.  Find new activities to keep your hands busy. Play with a pen, coin, or rubber band. Doodle or draw things on paper.  Brush your teeth right after eating. This will help cut down on the craving for the taste of tobacco after meals. You can also try mouthwash.   Use oral substitutes in place of cigarettes. Try using lemon drops, carrots, cinnamon sticks, or chewing gum. Keep them handy so they are available when you have the urge to smoke.  When you have the urge to smoke, try deep breathing.  Designate your home as a nonsmoking area.  If you are a heavy smoker, ask your health care provider about a prescription for nicotine chewing gum. It can ease your withdrawal from nicotine.  Reward yourself. Set aside the cigarette money you save  and buy yourself something nice.  Look for support from others. Join a support group or smoking cessation program. Ask someone at home or at work to help you with your plan to quit smoking.  Always ask yourself, "Do I need this cigarette or  is this just a reflex?" Tell yourself, "Today, I choose not to smoke," or "I do not want to smoke." You are reminding yourself of your decision to quit.  Do not replace cigarette smoking with electronic cigarettes (commonly called e-cigarettes). The safety of e-cigarettes is unknown, and some may contain harmful chemicals.  If you relapse, do not give up! Plan ahead and think about what you will do the next time you get the urge to smoke. HOW WILL I FEEL WHEN I QUIT SMOKING? You may have symptoms of withdrawal because your body is used to nicotine (the addictive substance in cigarettes). You may crave cigarettes, be irritable, feel very hungry, cough often, get headaches, or have difficulty concentrating. The withdrawal symptoms are only temporary. They are strongest when you first quit but will go away within 10-14 days. When withdrawal symptoms occur, stay in control. Think about your reasons for quitting. Remind yourself that these are signs that your body is healing and getting used to being without cigarettes. Remember that withdrawal symptoms are easier to treat than the major diseases that smoking can cause.  Even after the withdrawal is over, expect periodic urges to smoke. However, these cravings are generally short lived and will go away whether you smoke or not. Do not smoke! WHAT RESOURCES ARE AVAILABLE TO HELP ME QUIT SMOKING? Your health care provider can direct you to community resources or hospitals for support, which may include:  Group support.  Education.  Hypnosis.  Therapy.   This information is not intended to replace advice given to you by your health care provider. Make sure you discuss any questions you have with your health care  provider.   Document Released: 09/12/2004 Document Revised: 01/05/2015 Document Reviewed: 06/02/2013 Elsevier Interactive Patient Education 2016 [employer].    Medications discontinued today:  Medications Discontinued During This Encounter  Medication Reason  . Cholecalciferol (VITAMIN D3) 2000 UNITS TABS Error  . doxycycline (VIBRA-TABS) 100 MG tablet Error  . gabapentin (NEURONTIN) 470 MG capsule Duplicate  . oxyCODONE (ROXICODONE) 15 MG immediate release tablet Reorder   Medications administered today:  Willie Krueger had no medications administered during this visit.  Primary Care Physician: Juanell Fairly, MD Location: [facility] Note by: Kathlen Brunswick. Dossie Arbour, M.D, DABA, DABAPM, DABPM, DABIPP, FIPP

## 2015-12-03 NOTE — Patient Instructions (Signed)
Smoking Cessation, Tips for Success If you are ready to quit smoking, congratulations! You have chosen to help yourself be healthier. Cigarettes bring nicotine, tar, carbon monoxide, and other irritants into your body. Your lungs, heart, and blood vessels will be able to work better without these poisons. There are many different ways to quit smoking. Nicotine gum, nicotine patches, a nicotine inhaler, or nicotine nasal spray can help with physical craving. Hypnosis, support groups, and medicines help break the habit of smoking. WHAT THINGS CAN I DO TO MAKE QUITTING EASIER?  Here are some tips to help you quit for good:  Pick a date when you will quit smoking completely. Tell all of your friends and family about your plan to quit on that date.  Do not try to slowly cut down on the number of cigarettes you are smoking. Pick a quit date and quit smoking completely starting on that day.  Throw away all cigarettes.   Clean and remove all ashtrays from your home, work, and car.  On a card, write down your reasons for quitting. Carry the card with you and read it when you get the urge to smoke.  Cleanse your body of nicotine. Drink enough water and fluids to keep your urine clear or pale yellow. Do this after quitting to flush the nicotine from your body.  Learn to predict your moods. Do not let a bad situation be your excuse to have a cigarette. Some situations in your life might tempt you into wanting a cigarette.  Never have "just one" cigarette. It leads to wanting another and another. Remind yourself of your decision to quit.  Change habits associated with smoking. If you smoked while driving or when feeling stressed, try other activities to replace smoking. Stand up when drinking your coffee. Brush your teeth after eating. Sit in a different chair when you read the paper. Avoid alcohol while trying to quit, and try to drink fewer caffeinated beverages. Alcohol and caffeine may urge you to  smoke.  Avoid foods and drinks that can trigger a desire to smoke, such as sugary or spicy foods and alcohol.  Ask people who smoke not to smoke around you.  Have something planned to do right after eating or having a cup of coffee. For example, plan to take a walk or exercise.  Try a relaxation exercise to calm you down and decrease your stress. Remember, you may be tense and nervous for the first 2 weeks after you quit, but this will pass.  Find new activities to keep your hands busy. Play with a pen, coin, or rubber band. Doodle or draw things on paper.  Brush your teeth right after eating. This will help cut down on the craving for the taste of tobacco after meals. You can also try mouthwash.   Use oral substitutes in place of cigarettes. Try using lemon drops, carrots, cinnamon sticks, or chewing gum. Keep them handy so they are available when you have the urge to smoke.  When you have the urge to smoke, try deep breathing.  Designate your home as a nonsmoking area.  If you are a heavy smoker, ask your health care provider about a prescription for nicotine chewing gum. It can ease your withdrawal from nicotine.  Reward yourself. Set aside the cigarette money you save and buy yourself something nice.  Look for support from others. Join a support group or smoking cessation program. Ask someone at home or at work to help you with your plan   to quit smoking.  Always ask yourself, "Do I need this cigarette or is this just a reflex?" Tell yourself, "Today, I choose not to smoke," or "I do not want to smoke." You are reminding yourself of your decision to quit.  Do not replace cigarette smoking with electronic cigarettes (commonly called e-cigarettes). The safety of e-cigarettes is unknown, and some may contain harmful chemicals.  If you relapse, do not give up! Plan ahead and think about what you will do the next time you get the urge to smoke. HOW WILL I FEEL WHEN I QUIT SMOKING? You  may have symptoms of withdrawal because your body is used to nicotine (the addictive substance in cigarettes). You may crave cigarettes, be irritable, feel very hungry, cough often, get headaches, or have difficulty concentrating. The withdrawal symptoms are only temporary. They are strongest when you first quit but will go away within 10-14 days. When withdrawal symptoms occur, stay in control. Think about your reasons for quitting. Remind yourself that these are signs that your body is healing and getting used to being without cigarettes. Remember that withdrawal symptoms are easier to treat than the major diseases that smoking can cause.  Even after the withdrawal is over, expect periodic urges to smoke. However, these cravings are generally short lived and will go away whether you smoke or not. Do not smoke! WHAT RESOURCES ARE AVAILABLE TO HELP ME QUIT SMOKING? Your health care provider can direct you to community resources or hospitals for support, which may include:  Group support.  Education.  Hypnosis.  Therapy.   This information is not intended to replace advice given to you by your health care provider. Make sure you discuss any questions you have with your health care provider.   Document Released: 09/12/2004 Document Revised: 01/05/2015 Document Reviewed: 06/02/2013 [employer] Interactive Patient Education 2016 [employer]  

## 2015-12-03 NOTE — Progress Notes (Signed)
Safety precautions to be maintained throughout the outpatient stay will include: orient to surroundings, keep bed in low position, maintain call bell within reach at all times, provide assistance with transfer out of bed and ambulation. Oxycodone pill count #137/150

## 2015-12-04 ENCOUNTER — Other Ambulatory Visit: Payer: Self-pay | Admitting: Pain Medicine

## 2015-12-05 ENCOUNTER — Telehealth: Payer: Self-pay | Admitting: *Deleted

## 2015-12-05 ENCOUNTER — Encounter: Payer: Self-pay | Admitting: Internal Medicine

## 2015-12-05 ENCOUNTER — Ambulatory Visit (INDEPENDENT_AMBULATORY_CARE_PROVIDER_SITE_OTHER): Payer: 59 | Admitting: Internal Medicine

## 2015-12-05 VITALS — BP 144/90 | HR 88 | Temp 98.4°F | Wt 244.8 lb

## 2015-12-05 DIAGNOSIS — G629 Polyneuropathy, unspecified: Secondary | ICD-10-CM | POA: Diagnosis not present

## 2015-12-05 DIAGNOSIS — E1169 Type 2 diabetes mellitus with other specified complication: Secondary | ICD-10-CM | POA: Diagnosis not present

## 2015-12-05 DIAGNOSIS — E669 Obesity, unspecified: Secondary | ICD-10-CM | POA: Diagnosis not present

## 2015-12-05 DIAGNOSIS — M869 Osteomyelitis, unspecified: Secondary | ICD-10-CM | POA: Diagnosis not present

## 2015-12-05 MED ORDER — METRONIDAZOLE 500 MG PO TABS: 500.0000 mg | ORAL_TABLET | Freq: Three times a day (TID) | ORAL | Status: DC

## 2015-12-05 MED ORDER — CIPROFLOXACIN HCL 500 MG PO TABS: 500.0000 mg | ORAL_TABLET | Freq: Two times a day (BID) | ORAL | Status: DC

## 2015-12-05 NOTE — Progress Notes (Signed)
Patient ID: Willie Krueger, male   DOB: 24-May-1956, 59 y.o.   MRN: [id number]         [facility]  Reason for Consult: Right great toe osteomyelitis Referring Physician: Dr. Celesta Gentile  Patient Active Problem List   Diagnosis Date Noted  . Diabetic osteomyelitis ([place]) 12/05/2015    Priority: High  . Obesity 12/05/2015    Priority: Medium  . Chronic pain 10/31/2015    Priority: Medium  . HLD (hyperlipidemia) 10/20/2012    Priority: Medium  . Type 2 diabetes mellitus ([place]) 09/08/2012    Priority: Medium  . Current smoker 10/02/2011    Priority: Medium  . Neuropathy ([place]) 12/05/2015  . Encounter for chronic pain management 12/03/2015  . Elevated C-reactive protein (CRP) 12/03/2015  . Long term current use of opiate analgesic 10/31/2015  . Long term prescription opiate use 10/31/2015  . Opiate use 10/31/2015  . Opiate dependence ([place]) 10/31/2015  . Encounter for therapeutic drug level monitoring 10/31/2015  . Chronic neck pain (Right side) 10/31/2015  . Cervical spondylosis 10/31/2015  . Cervical post-laminectomy syndrome 10/31/2015  . Chronic low back pain (Midline pain) (R>L) (Primary Pain) 10/31/2015  . Bilateral lower extremity pain 10/31/2015  . Chronic cervical radicular pain (Bilateral) (R>L) 10/31/2015  . Chronic lumbar radicular pain (Bilateral) (R>L) 10/31/2015  . Substance use disorder Risk: LOW 10/31/2015  . 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 Epidural fibrosis 10/31/2015  . Chronic bilateral lower extremity pain 10/31/2015  . History of carpal tunnel release of both wrists 10/31/2015  . Grade I anterolisthesis of C7 on T1 10/31/2015  . History of spinal stenosis (Before surgery) 10/31/2015    Class: History of  . Toe ulcer ([place]) 06/24/2015  . Chronic pain associated with significant psychosocial dysfunction 05/04/2015  . Cervical spinal cord compression ([place]) 02/07/2015  . Nerve root pain 12/07/2014  . MI (mitral  incompetence) 11/11/2014  . Cardiac murmur 11/09/2014  . Degenerative arthritis of hip 07/26/2014  . Leg weakness 07/07/2014  . Nerve root inflammation 06/26/2014  . History of decompression of median nerve 05/19/2014  . Benign hypertension 10/20/2012  . 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 Chronic hepatitis C virus infection ([place]) 10/02/2011  . Adiposity 10/02/2011    Patient's Medications  New Prescriptions   METRONIDAZOLE (FLAGYL) 500 MG TABLET    Take 1 tablet (500 mg total) by mouth 3 (three) times daily.  Previous Medications   ACETAMINOPHEN (TYLENOL) 650 MG CR TABLET    Take 650 mg by mouth every 8 (eight) hours as needed for pain.   ASPIRIN 81 MG TABLET    Take 81 mg by mouth daily.   CHOLECALCIFEROL (VITAMIN D3) 2000 UNITS TABS    Take 1 capsule by mouth daily.   FUROSEMIDE (LASIX) 20 MG TABLET    Take 40 mg by mouth daily.   GABAPENTIN (NEURONTIN) 600 MG TABLET    Take 2 tablets 5 times a day   GLIMEPIRIDE (AMARYL) 2 MG TABLET    Take by mouth.   LISINOPRIL (PRINIVIL,ZESTRIL) 20 MG TABLET       METFORMIN (FORTAMET) 1000 MG (OSM) 24 HR TABLET    1,000 mg daily with breakfast. 1 po bid  For sugar control   OXYCODONE (ROXICODONE) 15 MG IMMEDIATE RELEASE TABLET    Take 1 tablet (15 mg total) by mouth 5 (five) times daily as needed for pain.   OXYCODONE (ROXICODONE) 15 MG IMMEDIATE RELEASE TABLET  Take 1 tablet (15 mg total) by mouth 5 (five) times daily as needed for pain.   PRAVASTATIN (PRAVACHOL) 20 MG TABLET    Take by mouth.  Modified Medications   Modified Medication Previous Medication   CIPROFLOXACIN (CIPRO) 500 MG TABLET ciprofloxacin (CIPRO) 500 MG tablet      Take 1 tablet (500 mg total) by mouth 2 (two) times daily.    Take 1 tablet (500 mg total) by mouth 2 (two) times daily.  Discontinued Medications   No medications on file    Recommendations: 1. Insert PICC 2. Intravenous vancomycin 3. Continue ciprofloxacin 4. Start oral  metronidazole 5. Monitor with weekly blood work 6. Follow-up in 2 weeks   Assessment: Willie Krueger appears to have developed osteomyelitis of the distal phalanx of his right great toe after being on long courses of empiric oral antibiotics. He also has diffuse cellulitis of his right lower leg. Ideally we would have a bone biopsy for Gram stain and culture but I would be afraid that cultures would be falsely negative after being on antibiotics and a biopsy may give him another open wound to heal from. I have reviewed options for management with him and we will start IV vancomycin, continue oral ciprofloxacin and start oral metronidazole. Most chronic diabetic foot infections involve polymicrobial infections with a mixture of aerobes and anaerobes. The metronidazole will add anaerobic coverage and also give some protection from C. difficile colitis.   HPI: Willie Krueger is a 59 y.o. male with multiple medical problems including diabetes, dyslipidemia, hypertension, neuropathy, chronic pain and cigarette use. He underwent bilateral carpal tunnel surgery last year and was left with weakness in both hands. He tells me that he had difficulty performing routine foot care. His toenails became long and thickened and caused his shoes to fit poorly. He started seeing Dr. Jacqualyn Posey for routine nail trimming and foot care. He had hyperkeratosis over the distal right great toe. In June of this year and ulcerated area over the tip of the toe was noted during Dr. Leigh Aurora exam. He was started on cephalexin but he continued to have diffuse swelling of the toe and foot. X-rays in September did not reveal any evidence of osteomyelitis. It appears he took several courses of cephalexin. However in late October he was seen with increased edema of his right foot associated with warmth. Repeat x-rays reveal possible cortical changes. He was started on clindamycin but developed a rash and was switched to doxycycline which it appears he  has been on ever since. His MRI revealed "edema of the distal phalanx that probably represents osteomyelitis". He was recently changed to ciprofloxacin. He has continued to have the open ulcerated area at the tip of his right great toe and diffuse swelling of his great toe, foot and lower leg.  He tells me that he is disabled by his chronic back pain. He is a retired Conservator, museum/gallery. He lives in [place], [place] with his wife. His wife works for the post [employer]. He has no children. He has smoked a pack and a half of cigarettes daily since he was 59 years old and states that he has no desire to quit.  Review of Systems: Review of Systems  Constitutional: Negative for fever, chills, weight loss, malaise/fatigue and diaphoresis.  HENT: Negative for sore throat.   Respiratory: Negative for cough, sputum production and shortness of breath.   Cardiovascular: Negative for chest pain.  Gastrointestinal: Negative for nausea, vomiting and diarrhea.  Genitourinary:  Negative for dysuria and frequency.  Musculoskeletal: Positive for back pain. Negative for myalgias and joint pain.  Skin: Negative for rash.       The rash he had resolved promptly after stopping clindamycin.  Neurological: Positive for focal weakness.       He notes weakness in both hands and some in his right leg.  Psychiatric/Behavioral: Negative for depression and substance abuse. The patient is not nervous/anxious.       Past Medical History  Diagnosis Date  . Diabetes mellitus without complication ([place])   . Hypertension   . Chronic pain   . Hyperlipidemia   . Osteoarthritis   . Heart murmur   . Mitral valve regurgitation   . History of spinal stenosis 10/31/2015    C3-4 with severe central canal stenosis with small amount of myelomalacia present. Moderate to severe canal stenosis is also present at C4-5 and C5-6. Moderate to severe neuroforaminal narrowing between the levels of C2-C7    Social History  Substance  Use Topics  . Smoking status: Current Every Day Smoker -- 1.50 packs/day    Types: Cigarettes  . Smokeless tobacco: None  . Alcohol Use: No    Family History  Problem Relation Age of Onset  . Cancer Mother   . Diabetes Mother   . Heart disease Father    Allergies  Allergen Reactions  . Cleocin [Clindamycin] Rash  . Sulfa Antibiotics Rash    OBJECTIVE: Filed Vitals:   12/05/15 1344  BP: 144/90  Pulse: 88  Temp: 98.4 F (36.9 C)  TempSrc: Oral  Weight: 244 lb 12 oz (111.018 kg)   Body mass index is 31.41 kg/(m^2).   Physical Exam  Constitutional: He is oriented to person, place, and time.  He is pleasant and in no distress.  HENT:  Mouth/Throat: No oropharyngeal exudate.  Missing any lower teeth.  Eyes: Conjunctivae are normal.  Cardiovascular: Normal rate and regular rhythm.   No murmur heard. Pulmonary/Chest: Breath sounds normal.  Abdominal: Soft. He exhibits no mass. There is no tenderness.  Musculoskeletal:  He does have muscle wasting in both hands.  Neurological: He is alert and oriented to person, place, and time.  Skin: No rash noted.  He has diffuse 1+ pitting edema from his right knee down to his right great toe. His lower leg and foot are slightly warm to touch and slightly red. He has a chronic ulcer over the distal right great toe surrounded by thick callus. There is no drainage or odor present.  Psychiatric: Mood and affect normal.    Microbiology: No results found for this or any previous visit (from the past 240 hour(s)).  Michel Bickers, MD [facility] [phone] pager   [phone] cell 12/05/2015, 2:33 PM

## 2015-12-05 NOTE — Telephone Encounter (Signed)
Per Dr Jacqualyn Posey the test was negative for a blood clot and called the patient to let him know and to call the office if any concerns or questions. Willie Krueger

## 2015-12-06 ENCOUNTER — Telehealth: Payer: Self-pay | Admitting: *Deleted

## 2015-12-06 NOTE — Telephone Encounter (Signed)
PICC placement appt at [facility], Monday, Dec. 12, 2016.  Requested that the pt arrive at the [facility] entrance to receive assistance to get to Radiology by 11:30 AM for PICC placement at 1200.  Pt verbalized understanding.  Pt informed that [facility] will be calling him to set up an appointment to start care on Tuesday, Dec. 13th.  [facility] has been faxed medication and [place] RN orders for Vancomycin per protocol for 6 weeks per Dr. Megan Salon.

## 2015-12-06 NOTE — Addendum Note (Signed)
Addended by: Lorne Skeens D on: 12/06/2015 09:16 AM   Modules accepted: Orders

## 2015-12-07 ENCOUNTER — Telehealth: Payer: Self-pay | Admitting: Podiatry

## 2015-12-07 ENCOUNTER — Telehealth: Payer: Self-pay | Admitting: *Deleted

## 2015-12-07 ENCOUNTER — Ambulatory Visit: Payer: 59 | Admitting: Podiatry

## 2015-12-07 NOTE — Telephone Encounter (Signed)
Pt. Called and was really upset because he was scheduled for an appt. Today. "I don't know why Dr. Jacqualyn Posey keeps wanting to see me, to look at my f______ing foot, I go see my wound care

## 2015-12-07 NOTE — Telephone Encounter (Signed)
[facility] nursing unable to take the pt.  Tried other [facility] agencies too.  Pt lives in [place].  Faxed pt orders/information to [facility] for review to see if they are able to accept the patient for infusion and nursing services. (Fax [phone])  Pt's PICC is being placed 12/10/15 and first dose of Vanc following.  [facility] to return call.

## 2015-12-08 LAB — TOXASSURE SELECT 13 (MW), URINE: PDF: 0

## 2015-12-10 ENCOUNTER — Other Ambulatory Visit: Payer: Self-pay | Admitting: Internal Medicine

## 2015-12-10 ENCOUNTER — Ambulatory Visit (HOSPITAL_COMMUNITY)
Admission: RE | Admit: 2015-12-10 | Discharge: 2015-12-10 | Disposition: A | Discharge status: Home / Self Care | Payer: 59 | Source: Ambulatory Visit | Attending: Internal Medicine | Admitting: Internal Medicine

## 2015-12-10 ENCOUNTER — Telehealth: Payer: Self-pay | Admitting: Podiatry

## 2015-12-10 ENCOUNTER — Telehealth: Payer: Self-pay | Admitting: *Deleted

## 2015-12-10 ENCOUNTER — Encounter (HOSPITAL_COMMUNITY)
Admission: RE | Admit: 2015-12-10 | Discharge: 2015-12-10 | Disposition: A | Discharge status: Home / Self Care | Payer: 59 | Source: Ambulatory Visit | Attending: Internal Medicine | Admitting: Internal Medicine

## 2015-12-10 DIAGNOSIS — E1169 Type 2 diabetes mellitus with other specified complication: Secondary | ICD-10-CM | POA: Insufficient documentation

## 2015-12-10 DIAGNOSIS — M868X7 Other osteomyelitis, ankle and foot: Secondary | ICD-10-CM | POA: Diagnosis present

## 2015-12-10 DIAGNOSIS — M869 Osteomyelitis, unspecified: Principal | ICD-10-CM

## 2015-12-10 MED ORDER — LIDOCAINE HCL 1 % IJ SOLN
INTRAMUSCULAR | Status: AC
  Filled 2015-12-10: qty 20

## 2015-12-10 MED ORDER — VANCOMYCIN HCL 10 G IV SOLR
2000.0000 mg | Freq: Once | INTRAVENOUS | Status: AC
  Administered 2015-12-10: 2000 mg via INTRAVENOUS
  Filled 2015-12-10: qty 2000

## 2015-12-10 MED ORDER — HEPARIN SOD (PORK) LOCK FLUSH 100 UNIT/ML IV SOLN: 500.0000 [IU] | Freq: Once | INTRAVENOUS | Status: DC

## 2015-12-10 NOTE — Procedures (Signed)
Successful placement of single lumen PICC line to right basilic vein. Length 42cm Tip at lower SVC/RA No complications Ready for use.  Ascencion Dike PA-C @TIMESTAMP @

## 2015-12-10 NOTE — Telephone Encounter (Signed)
Patient apparently call the office last Friday and was very belligerent of staff. He also states that he was not sure why I want to see him so often. He later called and asked medical directly. I did attempt to call him Friday afternoon however I was answered recheck him in a left voice nail. I also attempted call me again today, 12/10/2015, around 11:30 AM and again left voice mail. I asked him to return my phone call if he wishes to speak with me.   I have been seeing Mr. Gialanella over the last several months for wound on his big toe and swelling. I did not see him for sometime in the fall and he started to follow back up. After the wound continued to not continue to heal and he continued have swelling to the big toe I did order an MRI to rule out osteomyelitis. During periodic x-rays the bone was grossly unchanged. The MRI did reveal early signs of osteomyelitis. Throughout this whole process me seeing Mr. Athey I have discussed the possibility of amputation. After the MRI I discussed with him all alternatives, risks, complications of various treatment options including amputation, infectious disease consultation, wound care consultation in order to better educate him and to discuss options for his care. He states that he was not sure why I waited so want to order the MRI and I explained to him my reasons. Once I mentioned amputation he became upset. In order to try to save the toe I referred him to infectious disease. After his infectious disease consultation I did request for him to be seen back in the office for periodic x-rays to ensure that the wound was healing in that it was not progressing in order to help save the toe. He apparently told the staff he did not know why I want to see him and he cancelled the appointment. I have recommended a second opinion by the [facility] wound care center and elevated discussed this with him as he is obviously upset with my care. He states last time I saw him he was not  upset with me and I offered a second opinion at that time but he declined. At this point, I do recommend a 2nd opinion again, and the order for [facility] [place] was placed. I am more than happy to continue to see Mr. Fisch if he desires or at least until he gets the appropriate follow-up.

## 2015-12-10 NOTE — Telephone Encounter (Signed)
needing additional information:  Pt's height and BUN/Creatinine faxed to them.  RN faxed information to [facility].

## 2015-12-10 NOTE — Consult Note (Signed)
PHARMACY NOTE  CONSULT : INDICATION :  Willie Krueger is a 59 y.o. year-old male with Right Great Toe Osteomyelitis.  The patient is to receive empiric outpatient Vancomycin per Home Health following PICC Line placement today.  Pharmacy asked to order the initial dose for administration today in Medical Day Care.  Dosing Weight:  111 kg  Labs: Lab Results  Component Value Date   CREATININE 0.85 11/16/2015   BUN 16 11/16/2015   NA 137 11/16/2015   K 4.6 11/16/2015   CL 104 11/16/2015   CO2 22 11/16/2015  Estimated CrCl > 100 ml/min  Microbiology:  No cultures.  I.D. presumes polymicrobial infection with history of diabetic foot infection.    Assessment:  59 y/o male with presumed polymicrobial diabetic R-Great Toe Osteomyelitis who is to receive the initial dose of Vancomycin in Medical Day Care.  Plan: 1. Give Vancomycin 2000 mg IV x 1 in Medical Day Care. 2. Home Health Care to manage maintenance doses. [place] will sign off for now. 4. If you require further assistance, please re-consult Pharmacy as needed.  Thank you for allowing pharmacy to be a part of this patient's care.  Marthenia Rolling,  Pharm.D   12/10/2015,  1:00 PM

## 2015-12-10 NOTE — Telephone Encounter (Signed)
[facility] to return call about accepting the pt.

## 2015-12-14 ENCOUNTER — Encounter: Payer: 59 | Attending: Surgery | Admitting: Surgery

## 2015-12-14 ENCOUNTER — Ambulatory Visit: Payer: 59 | Admitting: Podiatry

## 2015-12-14 DIAGNOSIS — E11621 Type 2 diabetes mellitus with foot ulcer: Secondary | ICD-10-CM | POA: Insufficient documentation

## 2015-12-14 DIAGNOSIS — M86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, right ankle and foot: Secondary | ICD-10-CM | POA: Diagnosis not present

## 2015-12-14 DIAGNOSIS — I10 Essential (primary) hypertension: Secondary | ICD-10-CM | POA: Diagnosis not present

## 2015-12-14 DIAGNOSIS — J45909 Unspecified asthma, uncomplicated: Secondary | ICD-10-CM | POA: Insufficient documentation

## 2015-12-14 DIAGNOSIS — L97514 Non-pressure chronic ulcer of other part of right foot with necrosis of bone: Secondary | ICD-10-CM | POA: Insufficient documentation

## 2015-12-14 DIAGNOSIS — E1140 Type 2 diabetes mellitus with diabetic neuropathy, unspecified: Secondary | ICD-10-CM | POA: Insufficient documentation

## 2015-12-14 DIAGNOSIS — F17218 Nicotine dependence, cigarettes, with other nicotine-induced disorders: Secondary | ICD-10-CM | POA: Insufficient documentation

## 2015-12-15 NOTE — Progress Notes (Signed)
KELVEN, DEETS ([id number]) Visit Report for 12/14/2015 Abuse/Suicide Risk Screen Details Patient Name: Willie Krueger, Willie Krueger. Date of Service: 12/14/2015 8:45 AM Medical Record Number: [id number] Patient Account Number: [id number] Date of Birth/Sex: [birth year] (59 y.o. Male) Treating RN: Baruch Gouty, RN, BSN, Velva Harman Primary Care Physician: Juanell Fairly Other Clinician: Referring Physician: Juanell Fairly Treating Physician/Extender: Frann Rider in Treatment: 0 Abuse/Suicide Risk Screen Items Answer ABUSE/SUICIDE RISK SCREEN: Has anyone close to you tried to hurt or harm you recentlyo No Do you feel uncomfortable with anyone in your familyo No Has anyone forced you do things that you didnot want to doo No Do you have any thoughts of harming yourselfo No Patient displays signs or symptoms of abuse and/or neglect. No Electronic Signature(s) Signed: 12/14/2015 3:55:28 PM By: Regan Lemming BSN, RN Entered By: Regan Lemming on 12/14/2015 09:07:15 MALAKYE, ROCKEFELLER ([id number]) -------------------------------------------------------------------------------- Activities of Daily Living Details Patient Name: Willie Krueger. Date of Service: 12/14/2015 8:45 AM Medical Record Number: [id number] Patient Account Number: [id number] Date of Birth/Sex: 02-Mar-1956 (59 y.o. Male) Treating RN: Baruch Gouty, RN, BSN, Velva Harman Primary Care Physician: Juanell Fairly Other Clinician: Referring Physician: Juanell Fairly Treating Physician/Extender: Frann Rider in Treatment: 0 Activities of Daily Living Items Answer Activities of Daily Living (Please select one for each item) Drive Automobile Completely Able Take Medications Completely Able Use Telephone Completely Able Care for Appearance Completely Able Use Toilet Completely Able Bath / Shower Completely Able Dress Self Completely Able Feed Self Completely Able Walk Completely Able Get In / Out Bed Completely Able Housework Completely Able Prepare Meals Completely  [place] for Self Completely Able Electronic Signature(s) Signed: 12/14/2015 3:55:28 PM By: Regan Lemming BSN, RN Entered By: Regan Lemming on 12/14/2015 09:07:32 JAHMON, CREDIT ([id number]) -------------------------------------------------------------------------------- Education Assessment Details Patient Name: Willie Krueger. Date of Service: 12/14/2015 8:45 AM Medical Record Number: [id number] Patient Account Number: [id number] Date of Birth/Sex: 1956-05-13 (59 y.o. Male) Treating RN: Baruch Gouty, RN, BSN, Velva Harman Primary Care Physician: Juanell Fairly Other Clinician: Referring Physician: Juanell Fairly Treating Physician/Extender: Frann Rider in Treatment: 0 Primary Learner Assessed: Patient Learning Preferences/Education Level/Primary Language Learning Preference: Explanation Highest Education Level: High School Preferred Language: English Cognitive Barrier Assessment/Beliefs Language Barrier: No Physical Barrier Assessment Impaired Vision: No Impaired Hearing: No Knowledge/Comprehension Assessment Knowledge Level: High Comprehension Level: High Ability to understand written High instructions: Ability to understand verbal High instructions: Motivation Assessment Anxiety Level: Calm Cooperation: Cooperative Education Importance: Acknowledges Need Perception: Coherent Willingness to Engage in Self- High Management Activities: Readiness to Engage in Self- High Management Activities: Electronic Signature(s) Signed: 12/14/2015 3:55:28 PM By: Regan Lemming BSN, RN Entered By: Regan Lemming on 12/14/2015 09:07:50 KRISHON, LEITZKE ([id number]) -------------------------------------------------------------------------------- Fall Risk Assessment Details Patient Name: Willie Krueger. Date of Service: 12/14/2015 8:45 AM Medical Record Number: [id number] Patient Account Number: [id number] Date of Birth/Sex: Mar 09, 1956 (59 y.o. Male) Treating RN:  Baruch Gouty, RN, BSN, [place] Primary Care Physician: Juanell Fairly Other Clinician: Referring Physician: Juanell Fairly Treating Physician/Extender: Frann Rider in Treatment: 0 Fall Risk Assessment Items Have you had 2 or more falls in the last 12 monthso 0 No Have you had any fall that resulted in injury in the last 12 monthso 0 No FALL RISK ASSESSMENT: History of falling - immediate or within 3 months 0 No Secondary diagnosis 0 No Ambulatory aid None/bed rest/wheelchair/nurse 0 Yes Crutches/cane/walker 0 No Furniture 0 No IV Access/Saline Lock 0 No Gait/Training Normal/bed rest/immobile 0 Yes Weak 0 No Impaired 0  No Mental Status Oriented to own ability 0 Yes Electronic Signature(s) Signed: 12/14/2015 3:55:28 PM By: Regan Lemming BSN, RN Entered By: Regan Lemming on 12/14/2015 09:08:03 LENIS, MOHL ([id number]) -------------------------------------------------------------------------------- Foot Assessment Details Patient Name: Willie Krueger. Date of Service: 12/14/2015 8:45 AM Medical Record Number: [id number] Patient Account Number: [id number] Date of Birth/Sex: [birth year] (59 y.o. Male) Treating RN: Baruch Gouty, RN, BSN, Velva Harman Primary Care Physician: Juanell Fairly Other Clinician: Referring Physician: Juanell Fairly Treating Physician/Extender: Frann Rider in Treatment: 0 Foot Assessment Items Site Locations + = Sensation present, - = Sensation absent, C = Callus, U = Ulcer R = Redness, W = Warmth, M = Maceration, PU = Pre-ulcerative lesion F = Fissure, S = Swelling, D = Dryness Assessment Right: Left: Other Deformity: No No Prior Foot Ulcer: No No Prior Amputation: No No Charcot Joint: No No Ambulatory Status: Ambulatory Without Help Gait: Steady Electronic Signature(s) Signed: 12/14/2015 3:55:28 PM By: Regan Lemming BSN, RN Entered By: Regan Lemming on 12/14/2015 09:08:36 [name]  ([id number]) -------------------------------------------------------------------------------- Nutrition Risk Assessment Details Patient Name: Krueger, Willie. Date of Service: 12/14/2015 8:45 AM Medical Record Number: [id number] Patient Account Number: [id number] Date of Birth/Sex: 10/31/1956 (59 y.o. Male) Treating RN: Baruch Gouty, RN, BSN, Velva Harman Primary Care Physician: Juanell Fairly Other Clinician: Referring Physician: Juanell Fairly Treating Physician/Extender: Frann Rider in Treatment: 0 Height (in): Weight (lbs): Body Mass Index (BMI): Nutrition Risk Assessment Items NUTRITION RISK SCREEN: I have an illness or condition that made me change the kind and/or 0 No amount of food I eat I eat fewer than two meals per day 0 No I eat few fruits and vegetables, or milk products 0 No I have three or more drinks of beer, liquor or wine almost every day 0 No I have tooth or mouth problems that make it hard for me to eat 0 No I don't always have enough money to buy the food I need 0 No I eat alone most of the time 0 No I take three or more different prescribed or over-the-counter drugs a 0 No day Without wanting to, I have lost or gained 10 pounds in the last six 0 No months I am not always physically able to shop, cook and/or feed myself 0 No Nutrition Protocols Good Risk Protocol 0 No interventions needed Moderate Risk Protocol Electronic Signature(s) Signed: 12/14/2015 3:55:28 PM By: Regan Lemming BSN, RN Entered By: Regan Lemming on 12/14/2015 09:08:08

## 2015-12-15 NOTE — Progress Notes (Signed)
Willie Krueger, Willie Krueger ([id number]) Visit Report for 12/14/2015 Chief Complaint Document Details Patient Name: Willie Krueger, Willie Krueger. Date of Service: 12/14/2015 8:45 AM Medical Record Number: [id number] Patient Account Number: [id number] Date of Birth/Sex: [birth year] (59 y.o. Male) Treating RN: Willie Gouty, RN, Krueger, Willie Krueger Primary Care Physician: Willie Krueger Other Clinician: Referring Physician: Juanell Krueger Treating Physician/Extender: Willie Krueger in Treatment: 0 Information Obtained from: Patient Chief Complaint Patients presents for treatment of an open diabetic ulcer to his right big toe which she's had for several months Electronic Signature(s) Signed: 12/14/2015 10:40:32 AM By: Willie Fudge MD, Willie Krueger Entered By: Willie Krueger on 12/14/2015 10:40:31 Willie Krueger, Willie Krueger ([id number]) -------------------------------------------------------------------------------- Debridement Details Patient Name: Willie Krueger. Date of Service: 12/14/2015 8:45 AM Medical Record Number: [id number] Patient Account Number: [id number] Date of Birth/Sex: 1956-06-11 (59 y.o. Male) Treating RN: Willie Gouty, RN, Krueger, Willie Krueger Primary Care Physician: Willie Krueger Other Clinician: Referring Physician: Juanell Krueger Treating Physician/Extender: Willie Krueger in Treatment: 0 Debridement Performed for Wound #1 Right Toe Great Assessment: Performed By: Physician Willie Fudge, MD Debridement: Open Wound/Selective Debridement Selective Description: Pre-procedure Yes Verification/Time Out Taken: Start Time: 09:45 Pain Control: Lidocaine 4% Topical Solution Level: Skin/Dermis Total Area Debrided (L x 1 (cm) x 0.7 (cm) = 0.7 (cm) W): Tissue and other Non-Viable, Callus material debrided: Instrument: Forceps, Scissors Bleeding: None End Time: 09:58 Procedural Pain: 0 Post Procedural Pain: 0 Response to Treatment: Procedure was tolerated well Post Debridement Measurements of Total Wound Length: (cm) 2.3 Width: (cm)  2.3 Depth: (cm) 0.2 Volume: (cm) 0.831 Post Procedure Diagnosis Same as Pre-procedure Electronic Signature(s) Signed: 12/14/2015 10:40:13 AM By: Willie Fudge MD, Willie Krueger Signed: 12/14/2015 3:55:28 PM By: Willie Lemming BSN, RN Entered By: Willie Krueger on 12/14/2015 10:40:13 Willie Krueger, Willie Krueger ([id number]) -------------------------------------------------------------------------------- HPI Details Patient Name: Willie Krueger, Willie Krueger. Date of Service: 12/14/2015 8:45 AM Medical Record Number: [id number] Patient Account Number: [id number] Date of Birth/Sex: 11/01/1956 (59 y.o. Male) Treating RN: Willie Gouty, RN, Krueger, Willie Krueger Primary Care Physician: Willie Krueger Other Clinician: Referring Physician: Juanell Krueger Treating Physician/Extender: Willie Krueger in Treatment: 0 History of Present Illness Location: ulcerated area on his right big toe Quality: Patient reports No Pain. Severity: Patient states wound are getting worse. Duration: Patient has had the wound for > 3 months prior to seeking treatment at the wound center Timing: Pain in wound is Intermittent (comes and goes Context: The wound appeared gradually over time Modifying Factors: Consults to this date include: infectious disease who has put him on IV antibiotics with a PICC line Associated Signs and Symptoms: Patient reports having difficulty standing for long periods. HPI Description: 59 year old gentleman with multiple medical problems with a past medical history of diabetes mellitus, hypertension, neuropathy, chronic pain and nicotine abuse. he was recently seen by Dr. Vincenza Krueger of infectious disease at [facility] for a osteomyelitis of the distal phalanx of his right great toe. He also had diffuse cellulitis of the right lower leg. He has been advised to have a PICC line and will receive IV vancomycin and oral ciprofloxacin plus oral metronidazole. he had an MRI which was showing edema of the distal phalanx with probably represent  osteomyelitis. He smokes a pack and a half of cigarettes a day. his last hemoglobin A1c was 8% A venous duplex of the right lower extremity was done recently on 11/30/2015 and this showed no evidence of DVT, superficial thrombophlebitis or incompetence. Electronic Signature(s) Signed: 12/14/2015 10:41:23 AM By: Willie Fudge MD, Willie Krueger Previous Signature: 12/14/2015 9:33:19 AM Version By: Con Memos  Roderick Pee MD, Willie Krueger Previous Signature: 12/14/2015 9:26:06 AM Version By: Willie Fudge MD, Willie Krueger Entered By: Willie Krueger on 12/14/2015 10:41:23 Willie Krueger, Willie Krueger ([id number]) -------------------------------------------------------------------------------- Physical Exam Details Patient Name: Willie Krueger. Date of Service: 12/14/2015 8:45 AM Medical Record Number: [id number] Patient Account Number: [id number] Date of Birth/Sex: 10-12-1956 (59 y.o. Male) Treating RN: Willie Gouty, RN, Krueger, Willie Krueger Primary Care Physician: Willie Krueger Other Clinician: Referring Physician: Juanell Krueger Treating Physician/Extender: Willie Krueger in Treatment: 0 Constitutional . Pulse regular. Respirations normal and unlabored. Afebrile. . Eyes Nonicteric. Reactive to light. Ears, Nose, Mouth, and Throat Lips, teeth, and gums WNL.Marland Kitchen Moist mucosa without lesions . Neck supple and nontender. No palpable supraclavicular or cervical adenopathy. Normal sized without goiter. Respiratory WNL. No retractions.. Cardiovascular Pedal Pulses WNL. ABI on the left is 1.13 on the right is 1.55. he has significant edema of the right lower extremity and there is a significant discrepancy between the dimensions of his right calf and the left . Marland Kitchen Lymphatic No adneopathy. No adenopathy. No adenopathy. Musculoskeletal Adexa without tenderness or enlargement.. Digits and nails w/o clubbing, cyanosis, infection, petechiae, ischemia, or inflammatory conditions.. Integumentary (Hair, Skin) No suspicious lesions. No crepitus or fluctuance. No  peri-wound warmth or erythema. No masses.Marland Kitchen Psychiatric Judgement and insight Intact.. No evidence of depression, anxiety, or agitation.. Notes There is an open area at the tip of his right big toe and surrounding this large amount of callus which seems to have significant maceration under this. Sharp dissection be required with forceps and scissors to expose all the macerated skin and see if it probes down to bone. After debridement there was no bone exposed and it was not probably down to bone. Electronic Signature(s) Signed: 12/14/2015 10:42:54 AM By: Willie Fudge MD, Willie Krueger Entered By: Willie Krueger on 12/14/2015 10:42:54 Willie Krueger, Willie Krueger ([id number]) -------------------------------------------------------------------------------- Physician Orders Details Patient Name: Willie Krueger, DEBUSK. Date of Service: 12/14/2015 8:45 AM Medical Record Number: [id number] Patient Account Number: [id number] Date of Birth/Sex: 1956/05/18 (59 y.o. Male) Treating RN: Willie Gouty, RN, Krueger, [place] Primary Care Physician: Willie Krueger Other Clinician: Referring Physician: Juanell Krueger Treating Physician/Extender: Willie Krueger in Treatment: 0 Verbal / Phone Orders: Yes Clinician: Afful, RN, Krueger, Rita Read Back and Verified: Yes Diagnosis Coding ICD-10 Coding Code Description [phone] Type 2 diabetes mellitus with foot ulcer L97.514 Non-pressure chronic ulcer of other part of right foot with necrosis of bone M86.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, right ankle and foot F17.218 Nicotine dependence, cigarettes, with other nicotine-induced disorders Wound Cleansing Wound #1 Right Toe Great o Cleanse wound with mild soap and water o May Shower, gently pat wound dry prior to applying new dressing. o May shower with protection. Anesthetic Wound #1 Right Toe Great o Topical Lidocaine 4% cream applied to wound bed prior to debridement Primary Wound Dressing Wound #1 Right Toe Great o Aquacel  Ag Secondary Dressing Wound #1 Right Toe Great o Gauze and Kerlix/Conform Dressing Change Frequency Wound #1 Right Toe Great o Change dressing every other day. Follow-up Appointments Wound #1 Right Toe Great o Return Appointment in 1 week. Off-Loading EVIN, CHADDOCK ([id number]) Wound #1 Right Toe Great o Open toe surgical shoe with peg assist. o Other: - felt Electronic Signature(s) Signed: 12/14/2015 3:55:28 PM By: Willie Lemming BSN, RN Signed: 12/14/2015 4:28:44 PM By: Willie Fudge MD, Willie Krueger Entered By: Willie Lemming on 12/14/2015 10:01:19 Willie Krueger, Willie Krueger ([id number]) -------------------------------------------------------------------------------- Problem List Details Patient Name: ZYRAN, EYER. Date of Service: 12/14/2015 8:45 AM Medical Record Number: [id number] Patient  Account Number: [id number] Date of Birth/Sex: 1956/11/02 (59 y.o. Male) Treating RN: Willie Gouty, RN, Krueger, Willie Krueger Primary Care Physician: Willie Krueger Other Clinician: Referring Physician: Juanell Krueger Treating Physician/Extender: Willie Krueger in Treatment: 0 Active Problems ICD-10 Encounter Code Description Active Date Diagnosis E11.621 Type 2 diabetes mellitus with foot ulcer 12/14/2015 Yes L97.514 Non-pressure chronic ulcer of other part of right foot with 12/14/2015 Yes necrosis of bone M86.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, right ankle and foot 12/14/2015 Yes F17.218 Nicotine dependence, cigarettes, with other nicotine- 12/14/2015 Yes induced disorders L84 Corns and callosities 12/14/2015 Yes Inactive Problems Resolved Problems Electronic Signature(s) Signed: 12/14/2015 10:39:48 AM By: Willie Fudge MD, Willie Krueger Previous Signature: 12/14/2015 10:23:02 AM Version By: Willie Fudge MD, Willie Krueger Previous Signature: 12/14/2015 9:34:28 AM Version By: Willie Fudge MD, Willie Krueger Entered By: Willie Krueger on 12/14/2015 10:39:48 DEMITRE, BORTS  ([id number]) -------------------------------------------------------------------------------- Progress Note Details Patient Name: ZAQUAN, WHISLER. Date of Service: 12/14/2015 8:45 AM Medical Record Number: [id number] Patient Account Number: [id number] Date of Birth/Sex: March 10, 1956 (59 y.o. Male) Treating RN: Willie Gouty, RN, Krueger, Willie Krueger Primary Care Physician: Willie Krueger Other Clinician: Referring Physician: Juanell Krueger Treating Physician/Extender: Willie Krueger in Treatment: 0 Subjective Chief Complaint Information obtained from Patient Patients presents for treatment of an open diabetic ulcer to his right big toe which she's had for several months History of Present Illness (HPI) The following HPI elements were documented for the patient's wound: Location: ulcerated area on his right big toe Quality: Patient reports No Pain. Severity: Patient states wound are getting worse. Duration: Patient has had the wound for > 3 months prior to seeking treatment at the wound center Timing: Pain in wound is Intermittent (comes and goes Context: The wound appeared gradually over time Modifying Factors: Consults to this date include: infectious disease who has put him on IV antibiotics with a PICC line Associated Signs and Symptoms: Patient reports having difficulty standing for long periods. 59 year old gentleman with multiple medical problems with a past medical history of diabetes mellitus, hypertension, neuropathy, chronic pain and nicotine abuse. he was recently seen by Dr. Vincenza Krueger of infectious disease at [facility] for a osteomyelitis of the distal phalanx of his right great toe. He also had diffuse cellulitis of the right lower leg. He has been advised to have a PICC line and will receive IV vancomycin and oral ciprofloxacin plus oral metronidazole. he had an MRI which was showing edema of the distal phalanx with probably represent osteomyelitis. He smokes a pack and a half of  cigarettes a day. his last hemoglobin A1c was 8% A venous duplex of the right lower extremity was done recently on 11/30/2015 and this showed no evidence of DVT, superficial thrombophlebitis or incompetence. Wound History Patient presents with 1 open wound that has been present for approximately 63months. Patient has been treating wound in the following manner: dry dressing. Laboratory tests have not been performed in the last month. Patient reportedly has not tested positive for an antibiotic resistant organism. Patient reportedly has not tested positive for osteomyelitis. Patient reportedly has not had testing performed to evaluate circulation in the legs. Patient experiences the following problems associated with their wounds: infection. Patient History Information obtained from Patient, Caregiver, Chart. ARTY, GRADILLAS ([id number]) Allergies No known allergies Family History Diabetes - Mother, Hypertension - Mother, Stroke - Mother, No family history of Cancer, Heart Disease, Hereditary Spherocytosis, Kidney Disease, Lung Disease, Seizures, Thyroid Problems, Tuberculosis. Social History Current every day smoker - pack and half, Marital Status - Married, Alcohol  Use - Never, Drug Use - No History, Caffeine Use - Daily. Medical History Eyes Denies history of Cataracts, Glaucoma, Optic Neuritis Ear/Nose/Mouth/Throat Denies history of Chronic sinus problems/congestion, Middle ear problems Hematologic/Lymphatic Patient has history of Anemia Denies history of Hemophilia, Human Immunodeficiency Virus, Lymphedema, Sickle Cell Disease Respiratory Patient has history of Asthma Denies history of Aspiration, Chronic Obstructive Pulmonary Disease (COPD), Pneumothorax, Sleep Apnea, Tuberculosis Cardiovascular Patient has history of Hypertension Gastrointestinal Denies history of Cirrhosis , Colitis, Crohn s, Hepatitis A, Hepatitis B, Hepatitis C Endocrine Patient has history of Type II  Diabetes Immunological Denies history of Lupus Erythematosus, Raynaud s, Scleroderma Integumentary (Skin) Denies history of History of Burn, History of pressure wounds Musculoskeletal Denies history of Gout, Rheumatoid Arthritis, Osteoarthritis, Osteomyelitis Neurologic Patient has history of Neuropathy Denies history of Dementia, Quadriplegia, Paraplegia, Seizure Disorder Oncologic Denies history of Received Chemotherapy, Received Radiation Psychiatric Denies history of Anorexia/bulimia, Confinement Anxiety Patient is treated with Oral Agents. Blood sugar is not tested. Blood sugar results noted at the following times: Breakfast - 90-120. Medical And Surgical History Notes Cardiovascular HORTON, RHODES ([id number]) high choesterol Review of Systems (ROS) Constitutional Symptoms (General Health) The patient has no complaints or symptoms. Eyes The patient has no complaints or symptoms. Ear/Nose/Mouth/Throat The patient has no complaints or symptoms. Hematologic/Lymphatic The patient has no complaints or symptoms. Respiratory The patient has no complaints or symptoms. Cardiovascular The patient has no complaints or symptoms. Gastrointestinal The patient has no complaints or symptoms. Endocrine The patient has no complaints or symptoms. Genitourinary The patient has no complaints or symptoms. Immunological The patient has no complaints or symptoms. Integumentary (Skin) Complains or has symptoms of Wounds. Musculoskeletal The patient has no complaints or symptoms. Neurologic The patient has no complaints or symptoms. Oncologic The patient has no complaints or symptoms. Psychiatric The patient has no complaints or symptoms. Objective Constitutional Pulse regular. Respirations normal and unlabored. Afebrile. Vitals Time Taken: 9:18 AM, Height: 74 in, Source: Stated, Weight: 242 lbs, Source: Stated, BMI: 31.1, Temperature: 98.1 F, Pulse: 78 bpm, Respiratory Rate: 18  breaths/min, Blood Pressure: 143/82 mmHg. Eyes Nonicteric. Reactive to light. PABEL, CIOLLI. ([id number]) Ears, Nose, Mouth, and Throat Lips, teeth, and gums WNL.Marland Kitchen Moist mucosa without lesions . Neck supple and nontender. No palpable supraclavicular or cervical adenopathy. Normal sized without goiter. Respiratory WNL. No retractions.. Cardiovascular Pedal Pulses WNL. ABI on the left is 1.13 on the right is 1.55. he has significant edema of the right lower extremity and there is a significant discrepancy between the dimensions of his right calf and the left . Marland Kitchen Lymphatic No adneopathy. No adenopathy. No adenopathy. Musculoskeletal Adexa without tenderness or enlargement.. Digits and nails w/o clubbing, cyanosis, infection, petechiae, ischemia, or inflammatory conditions.Marland Kitchen Psychiatric Judgement and insight Intact.. No evidence of depression, anxiety, or agitation.. General Notes: There is an open area at the tip of his right big toe and surrounding this large amount of callus which seems to have significant maceration under this. Sharp dissection be required with forceps and scissors to expose all the macerated skin and see if it probes down to bone. After debridement there was no bone exposed and it was not probably down to bone. Integumentary (Hair, Skin) No suspicious lesions. No crepitus or fluctuance. No peri-wound warmth or erythema. No masses.. Wound #1 status is Open. Original cause of wound was Gradually Appeared. The wound is located on the Right Toe Great. The wound measures 1cm length x 0.7cm width x 0.1cm depth; 0.55cm^2 area and 0.055cm^3  volume. The wound is limited to skin breakdown. There is no tunneling or undermining noted. There is a none present amount of drainage noted. The wound margin is distinct with the outline attached to the wound base. There is medium (34-66%) pink, pale granulation within the wound bed. There is a small (1-33%) amount of necrotic tissue  within the wound bed including Adherent Slough. The periwound skin appearance exhibited: Dry/Scaly. The periwound skin appearance did not exhibit: Callus, Crepitus, Excoriation, Fluctuance, Friable, Induration, Localized Edema, Rash, Scarring, Maceration, Moist, Atrophie Blanche, Cyanosis, Ecchymosis, Hemosiderin Staining, Mottled, Pallor, Rubor, Erythema. Periwound temperature was noted as No Abnormality. 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 He also has a nicotine addiction and has several other comorbidities. I have recommended: 1. Aquacel Ag over the open ulcerated area and a bordered foam. 2. Offloading shoes which she already has but has not been using regularly and I have stressed the importance of this 3. Continue with his IV antibiotics and care as per Dr. Vincenza Krueger of infectious disease 4. Spent over 5 minutes discuss with him the need to completely give up smoking and have discussed the benefits of smoking cessation in great detail. Patient has several excuses but says he will try to be compliant 5. Hyperbaric oxygen therapy with the protocol for osteomyelitis to be given over 8 weeks with 40 sittings. the patient has had several questions all answered to his satisfaction and he will let is know in the coming week if he is able to fit this treatment protocol into his schedule. Procedures Wound #1 Wound #1 is a Diabetic Wound/Ulcer of the Lower Extremity located on the Right Toe Great . There was a Skin/Dermis Open  Wound/Selective [phone]) debridement with total area of 0.7 sq cm performed by Willie Fudge, MD. with the following instrument(s): Forceps and Scissors to remove Non-Viable tissue/material including Callus after achieving pain control using Lidocaine 4% Topical Solution. A time out was conducted prior to the start of the procedure. There was no bleeding. The procedure was tolerated well with a pain level of 0 throughout and a pain level of 0 following the procedure. Post Debridement Measurements: 2.3cm length x 2.3cm width x 0.2cm depth; 0.831cm^3 volume. Post procedure Diagnosis Wound #1: Same as Pre-Procedure Plan KOTARO, MIZERA ([id number]) Wound Cleansing: Wound #1 Right Toe Great: Cleanse wound with mild soap and water May Shower, gently pat wound dry prior to applying new dressing. May shower with protection. Anesthetic: Wound #1 Right Toe Great: Topical Lidocaine 4% cream applied to wound bed prior to debridement Primary Wound Dressing: Wound #1 Right Toe Great: Aquacel Ag Secondary Dressing: Wound #1 Right Toe Great: Gauze and Kerlix/Conform Dressing Change Frequency: Wound #1 Right Toe Great: Change dressing every other day. Follow-up Appointments: Wound #1 Right Toe Great: Return Appointment in 1 week. Off-Loading: Wound #1 Right Toe Great: Open toe surgical shoe with peg assist. Other: - felt This 59 year old gentleman has a Wagner stage III diabetic foot ulcer with osteomyelitis at the tip of his right big toe. He also has a nicotine addiction and has several other comorbidities.  I have recommended: 1. Aquacel Ag over the open ulcerated area and a bordered foam. 2. Offloading shoes which she already has but has not been using regularly and I have stressed the importance of this 3. Continue with his IV antibiotics and care as per Dr. Vincenza Krueger of infectious disease 4. Spent over 5 minutes discuss with him the need to completely give up smoking and have  discussed the benefits of smoking cessation in great detail. Patient has several excuses but says he will try to be compliant 5. Hyperbaric oxygen therapy with the protocol for osteomyelitis to be given over 8 weeks with 40 sittings. the patient has had several questions all answered to his satisfaction and he will let is know in the coming week if he is able to fit this treatment protocol into his schedule. PASQUAL, BANDY ([id number]) Electronic Signature(s) Signed: 12/14/2015 10:48:03 AM By: Willie Fudge MD, Willie Krueger Entered By: Willie Krueger on 12/14/2015 10:48:03 Willie Krueger, Willie Krueger ([id number]) -------------------------------------------------------------------------------- ROS/PFSH Details Patient Name: Willie Krueger, Willie Krueger. Date of Service: 12/14/2015 8:45 AM Medical Record Number: [id number] Patient Account Number: [id number] Date of Birth/Sex: [birth year] (59 y.o. Male) Treating RN: Willie Gouty, RN, Krueger, Belva Primary Care Physician: Willie Krueger Other Clinician: Referring Physician: Juanell Krueger Treating Physician/Extender: Willie Krueger in Treatment: 0 Information Obtained From Patient Caregiver Chart Wound History Do you currently have one or more open woundso Yes How many open wounds do you currently haveo 1 Approximately how long have you had your woundso 2months How have you been treating your wound(s) until nowo dry dressing Has your wound(s) ever healed and then re-openedo No Have you had any lab work done in the past montho No Have you tested positive for an antibiotic resistant organism (MRSA, VRE)o No Have you tested positive for osteomyelitis (bone infection)o No Have you had any tests for circulation on your legso No Have you had other problems associated with your woundso Infection Integumentary (Skin) Complaints and Symptoms: Positive for: Wounds Medical History: Negative for: History of Burn; History of pressure wounds Constitutional Symptoms (General  Health) Complaints and Symptoms: No Complaints or Symptoms Eyes Complaints and Symptoms: No Complaints or Symptoms Medical History: Negative for: Cataracts; Glaucoma; Optic Neuritis Ear/Nose/Mouth/Throat Complaints and Symptoms: No Complaints or Symptoms Medical History: Willie Krueger, MAYEAUX ([id number]) Negative for: Chronic sinus problems/congestion; Middle ear problems Hematologic/Lymphatic Complaints and Symptoms: No Complaints or Symptoms Medical History: Positive for: Anemia Negative for: Hemophilia; Human Immunodeficiency Virus; Lymphedema; Sickle Cell Disease Respiratory Complaints and Symptoms: No Complaints or Symptoms Medical History: Positive for: Asthma Negative for: Aspiration; Chronic Obstructive Pulmonary Disease (COPD); Pneumothorax; Sleep Apnea; Tuberculosis Cardiovascular Complaints and Symptoms: No Complaints or Symptoms Medical History: Positive for: Hypertension Past Medical History Notes: high choesterol Gastrointestinal Complaints and Symptoms: No Complaints or Symptoms Medical History: Negative for: Cirrhosis ; Colitis; Crohnos; Hepatitis A; Hepatitis B; Hepatitis C Endocrine Complaints and Symptoms: No Complaints or Symptoms Medical History: Positive for: Type II Diabetes Time with diabetes: 15 Treated with: Oral agents Blood sugar tested every day: No Blood sugar testing results: KELTEN, HOCHSTEIN ([id number]) Breakfast: 90-120 Genitourinary Complaints and Symptoms: No Complaints or Symptoms Immunological Complaints and Symptoms: No Complaints or Symptoms Medical History: Negative for: Lupus Erythematosus; Raynaudos; Scleroderma Musculoskeletal Complaints and Symptoms: No Complaints or Symptoms Medical History: Negative for: Gout; Rheumatoid Arthritis; Osteoarthritis; Osteomyelitis Neurologic Complaints and Symptoms: No Complaints or Symptoms Medical History: Positive for: Neuropathy Negative for: Dementia; Quadriplegia;  Paraplegia; Seizure Disorder Oncologic Complaints and Symptoms: No Complaints or Symptoms Medical  History: Negative for: Received Chemotherapy; Received Radiation Psychiatric Complaints and Symptoms: No Complaints or Symptoms Medical History: Negative for: Anorexia/bulimia; Confinement Anxiety Family and Social History Cancer: No; Diabetes: Yes - Mother; Heart Disease: No; Hereditary Spherocytosis: No; Hypertension: Yes - Mother; Kidney Disease: No; Lung Disease: No; Seizures: No; Stroke: Yes - Mother; Thyroid Problems: JAAN, FILAR ([id number]) No; Tuberculosis: No; Current every day smoker - pack and half; Marital Status - Married; Alcohol Use: Never; Drug Use: No History; Caffeine Use: Daily; Financial Concerns: No; Food, Clothing or Shelter Needs: No; Support System Lacking: No; Transportation Concerns: No; Advanced Directives: No; Patient does not want information on Advanced Directives; Living Will: No Physician Affirmation I have reviewed and agree with the above information. Electronic Signature(s) Signed: 12/14/2015 9:35:09 AM By: Willie Fudge MD, Willie Krueger Signed: 12/14/2015 3:55:28 PM By: Willie Lemming BSN, RN Entered By: Willie Krueger on 12/14/2015 09:35:08 BRALEN, DESCHAINE ([id number]) -------------------------------------------------------------------------------- SuperBill Details Patient Name: KAMEN, HOSTETLER. Date of Service: 12/14/2015 Medical Record Number: [id number] Patient Account Number: [id number] Date of Birth/Sex: 1956/05/30 (59 y.o. 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 Nicotine dependence, cigarettes, with other nicotine-induced disorders L84 Corns and callosities Facility Procedures CPT4 Code Description: [id number] - DEBRIDE WOUND 1ST 20 SQ CM OR < ICD-10 Description Diagnosis E11.621 Type 2 diabetes mellitus with foot ulcer 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.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,  right ankle and foo Modifier: t with necros t Quantity: 1 is of bone CPT4 Code Description: [id number] CESSATION 3-10MINS ICD-10 Description Diagnosis E11.621 Type 2 diabetes mellitus with foot ulcer F17.218 Nicotine dependence, cigarettes, with other nicotine- Modifier: induced disor Quantity: 1 ders Physician Procedures CPT4 Code Description: [id number] - WC PHYS LEVEL 4 - NEW PT ICD-10 Description Diagnosis E11.621 Type 2 diabetes mellitus with foot ulcer 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.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, right  ankle and foo L84 Corns and callosities Modifier: 25 t with necros t Quantity: 1 is of bone CPT4 Code Description: [id number] - WC PHYS DEBR WO ANESTH 20 SQ CM QUAVON, WACEK ([id number]) Modifier: Quantity: 1 Electronic Signature(s) Signed: 12/14/2015 10:48:44 AM By: Willie Fudge MD, Willie Krueger Entered By: Willie Krueger on 12/14/2015 10:48:44

## 2015-12-15 NOTE — Progress Notes (Signed)
Willie Krueger ([id number]) Visit Report for 12/14/2015 Allergy List Details Patient Name: Willie Krueger, Willie Krueger. Date of Service: 12/14/2015 8:45 AM Medical Record Number: [id number] Patient Account Number: [id number] Date of Birth/Sex: 1956-01-01 (59 y.o. Male) Treating RN: Baruch Gouty, RN, BSN, Velva Harman Primary Care Physician: Juanell Fairly Other Clinician: Referring Physician: Juanell Fairly Treating Physician/Extender: Frann Rider in Treatment: 0 Allergies Active Allergies No known allergies Allergy Notes Electronic Signature(s) Signed: 12/14/2015 3:55:28 PM By: Regan Lemming BSN, RN Entered By: Regan Lemming on 12/14/2015 09:07:06 Willie Krueger ([id number]) -------------------------------------------------------------------------------- Arrival Information Details Patient Name: Willie Krueger, Willie Krueger. Date of Service: 12/14/2015 8:45 AM Medical Record Number: [id number] Patient Account Number: [id number] Date of Birth/Sex: Oct 29, 1956 (59 y.o. Male) Treating RN: Baruch Gouty, RN, BSN, Velva Harman Primary Care Physician: Juanell Fairly Other Clinician: Referring Physician: Juanell Fairly Treating Physician/Extender: Frann Rider in Treatment: 0 Visit Information Patient Arrived: Ambulatory Arrival Time: 09:05 Accompanied By: SELF Transfer Assistance: None Patient Identification Verified: Yes Secondary Verification Process Yes Completed: Patient Requires Transmission- No Based Precautions: Patient Has Alerts: Yes Patient Alerts: Patient on Blood Thinner Aspirin, Dm2 Electronic Signature(s) Signed: 12/14/2015 3:55:28 PM By: Regan Lemming BSN, RN Entered By: Regan Lemming on 12/14/2015 09:06:28 Willie Krueger ([id number]) -------------------------------------------------------------------------------- Encounter Discharge Information Details Patient Name: Willie Krueger, Willie Krueger. Date of Service: 12/14/2015 8:45 AM Medical Record Number: [id number] Patient Account Number: [id number] Date of Birth/Sex:  Nov 13, 1956 (59 y.o. Male) Treating RN: Baruch Gouty, RN, BSN, Velva Harman Primary Care Physician: Juanell Fairly Other Clinician: Referring Physician: Juanell Fairly Treating Physician/Extender: Frann Rider in Treatment: 0 Encounter Discharge Information Items Discharge Pain Level: 0 Discharge Condition: Stable Ambulatory Status: Ambulatory Discharge Destination: Home Private Transportation: Auto Accompanied By: self Schedule Follow-up Appointment: No Medication Reconciliation completed and No provided to Patient/Care [name]: Clinical Summary of Care: Electronic Signature(s) Signed: 12/14/2015 3:55:28 PM By: Regan Lemming BSN, RN Entered By: Regan Lemming on 12/14/2015 10:17:12 Willie Krueger ([id number]) -------------------------------------------------------------------------------- Lower Extremity Assessment Details Patient Name: Willie Krueger, Willie Krueger. Date of Service: 12/14/2015 8:45 AM Medical Record Number: [id number] Patient Account Number: [id number] Date of Birth/Sex: [birth year] (59 y.o. Male) Treating RN: Baruch Gouty, RN, BSN, Velva Harman Primary Care Physician: Juanell Fairly Other Clinician: Referring Physician: Juanell Fairly Treating Physician/Extender: Frann Rider in Treatment: 0 Vascular Assessment Claudication: Claudication Assessment [Left:None] [Right:None] Pulses: Posterior Tibial Palpable: [Left:No] Dorsalis Pedis Palpable: [Left:Yes] [Right:No] Doppler: [Left:Multiphasic] [Right:Multiphasic] Extremity colors, hair growth, and conditions: Extremity Color: [Left:Normal] [Right:Normal] Hair Growth on Extremity: [Left:Yes] [Right:Yes] Temperature of Extremity: [Left:Warm] [Right:Warm] Capillary Refill: [Left:< 3 seconds] [Right:< 3 seconds] Blood Pressure: Brachial: [Left:142] Dorsalis Pedis: 160 [Left:Dorsalis Pedis: [id number] Ankle: Posterior Tibial: 158 [Left:Posterior Tibial: 220 1.13] [Right:1.55] Toe Nail Assessment Left: Right: Thick: Yes Yes Discolored: Yes  Yes Deformed: No No Improper Length and Hygiene: No No Electronic Signature(s) Signed: 12/14/2015 3:55:28 PM By: Regan Lemming BSN, RN Entered By: Regan Lemming on 12/14/2015 09:30:09 Willie Krueger, Willie Krueger ([id number]) -------------------------------------------------------------------------------- Multi Wound Chart Details Patient Name: Willie Krueger, Willie Krueger. Date of Service: 12/14/2015 8:45 AM Medical Record Number: [id number] Patient Account Number: [id number] Date of Birth/Sex: [birth year] (59 y.o. Male) Treating RN: Baruch Gouty, RN, BSN, Velva Harman Primary Care Physician: Juanell Fairly Other Clinician: Referring Physician: Juanell Fairly Treating Physician/Extender: Frann Rider in Treatment: 0 Vital Signs Height(in): 74 Pulse(bpm): 78 Weight(lbs): 242 Blood Pressure 143/82 (mmHg): Body Mass Index(BMI): 31 Temperature(F): 98.1 Respiratory Rate 18 (breaths/min): Photos: [1:No Photos] [N/A:N/A] Wound Location: [1:Right Toe Great] [N/A:N/A] Wounding Event: [1:Gradually Appeared] [N/A:N/A] Primary Etiology: [1:Diabetic Wound/Ulcer of the Lower Extremity] [N/A:N/A] Comorbid History: [  1:Anemia, Asthma, Hypertension, Type II Diabetes, Neuropathy] [N/A:N/A] Date Acquired: [1:07/02/2015] [N/A:N/A] Weeks of Treatment: [1:0] [N/A:N/A] Wound Status: [1:Open] [N/A:N/A] Measurements L x W x D 1x0.7x0.1 [N/A:N/A] (cm) Area (cm) : [1:0.55] [N/A:N/A] Volume (cm) : [1:0.055] [N/A:N/A] % Reduction in Area: [1:0.00%] [N/A:N/A] % Reduction in Volume: 0.00% [N/A:N/A] Classification: [1:Grade 1] [N/A:N/A] Exudate Amount: [1:None Present] [N/A:N/A] Wound Margin: [1:Distinct, outline attached] [N/A:N/A] Granulation Amount: [1:Medium (34-66%)] [N/A:N/A] Granulation Quality: [1:Pink, Pale] [N/A:N/A] Necrotic Amount: [1:Small (1-33%)] [N/A:N/A] Exposed Structures: [1:Fascia: No Fat: No Tendon: No Muscle: No Joint: No Bone: No] [N/A:N/A] Limited to Skin Breakdown Epithelialization: None N/A N/A Periwound Skin  Texture: Edema: No N/A N/A Excoriation: No Induration: No Callus: No Crepitus: No Fluctuance: No Friable: No Rash: No Scarring: No Periwound Skin Dry/Scaly: Yes N/A N/A Moisture: Maceration: No Moist: No Periwound Skin Color: Atrophie Blanche: No N/A N/A Cyanosis: No Ecchymosis: No Erythema: No Hemosiderin Staining: No Mottled: No Pallor: No Rubor: No Temperature: No Abnormality N/A N/A Tenderness on No N/A N/A Palpation: Wound Preparation: Ulcer Cleansing: N/A N/A Rinsed/Irrigated with Saline Topical Anesthetic Applied: Other: lidocaine 4% Treatment Notes Electronic Signature(s) Signed: 12/14/2015 3:55:28 PM By: Regan Lemming BSN, RN Entered By: Regan Lemming on 12/14/2015 09:41:13 Willie Krueger, Willie Krueger ([id number]) -------------------------------------------------------------------------------- [place] Details Patient Name: Willie Krueger, Willie Krueger. Date of Service: 12/14/2015 8:45 AM Medical Record Number: [id number] Patient Account Number: [id number] Date of Birth/Sex: 1956/05/14 (60 y.o. Male) Treating RN: Baruch Gouty, RN, BSN, [place] Primary Care Physician: Juanell Fairly Other Clinician: Referring Physician: Juanell Fairly Treating Physician/Extender: Frann Rider in Treatment: 0 Active Inactive HBO Nursing Diagnoses: Anxiety related to feelings of confinement associated with the hyperbaric oxygen chamber Anxiety related to knowledge deficit of hyperbaric oxygen therapy and treatment procedures Discomfort related to temperature and humidity changes inside hyperbaric chamber Potential for barotraumas to ears, sinuses, teeth, and lungs or cerebral gas embolism related to changes in atmospheric pressure inside hyperbaric oxygen chamber Potential for oxygen toxicity seizures related to delivery of 100% oxygen at an increased atmospheric pressure Potential for pulmonary oxygen toxicity related to delivery of 100% oxygen at an increased  atmospheric pressure Goals: Barotrauma will be prevented during HBO2 Date Initiated: 12/14/2015 Goal Status: Active Patient and/or family will be able to state/discuss factors appropriate to the management of their disease process during treatment Date Initiated: 12/14/2015 Goal Status: Active Patient will tolerate the hyperbaric oxygen therapy treatment Date Initiated: 12/14/2015 Goal Status: Active Patient will tolerate the internal climate of the chamber Date Initiated: 12/14/2015 Goal Status: Active Patient/caregiver will verbalize understanding of HBO goals, rationale, procedures and potential hazards Date Initiated: 12/14/2015 Goal Status: Active Signs and symptoms of pulmonary oxygen toxicity will be recognized and promptly addressed Date Initiated: 12/14/2015 Goal Status: Active Signs and symptoms of seizure will be recognized and promptly addressed ; seizing patients will suffer no harm Date Initiated: 12/14/2015 AMIE, SPAWN ([id number]) Goal Status: Active Interventions: Administer a five (5) minute air break for patient if signs and symptoms of seizure appear and notify the hyperbaric physician Administer a ten (10) minute air break for patient if signs and symptoms of seizure appear and notify the hyperbaric physician Administer decongestants, per physician orders, prior to HBO2 Administer the correct therapeutic gas delivery based on the patients needs and limitations, per physician order Assess and provide for patientos comfort related to the hyperbaric environment and equalization of middle ear Assess for signs and symptoms related to adverse events, including but not limited to confinement anxiety, pneumothorax, oxygen toxicity and baurotrauma  Assess patient for any history of confinement anxiety Assess patient's knowledge and expectations regarding hyperbaric medicine and provide education related to the hyperbaric environment, goals of treatment and  prevention of adverse events Implement protocols to decrease risk of pneumothorax in high risk patients Notes: Orientation to the Wound Care Program Nursing Diagnoses: Knowledge deficit related to the wound healing center program Goals: Patient/caregiver will verbalize understanding of the [facility] Date Initiated: 12/14/2015 Goal Status: Active Interventions: Provide education on orientation to the wound center Notes: Osteomyelitis Nursing Diagnoses: Infection: osteomyelitis Knowledge deficit related to disease process and management Potential for infection: osteomyelitis Goals: Diagnostic evaluation for osteomyelitis completed as ordered Date Initiated: 12/14/2015 Goal Status: Active Willie Krueger, Willie Krueger ([id number]) Patient/caregiver will verbalize understanding of disease process and disease management Date Initiated: 12/14/2015 Goal Status: Active Patient's osteomyelitis will resolve Date Initiated: 12/14/2015 Goal Status: Active Signs and symptoms for osteomyelitis will be recognized and promptly addressed Date Initiated: 12/14/2015 Goal Status: Active Interventions: Assess for signs and symptoms of osteomyelitis resolution every visit Provide education on osteomyelitis Screen for HBO Treatment Activities: Consult for HBO : 12/14/2015 MRI : 12/14/2015 Systemic antibiotics : 12/14/2015 Notes: Wound/Skin Impairment Nursing Diagnoses: Impaired tissue integrity Knowledge deficit related to smoking impact on wound healing Knowledge deficit related to ulceration/compromised skin integrity Goals: Patient will demonstrate a reduced rate of smoking or cessation of smoking Date Initiated: 12/14/2015 Goal Status: Active Patient will have a decrease in wound volume by X% from date: (specify in notes) Date Initiated: 12/14/2015 Goal Status: Active Patient/caregiver will verbalize understanding of skin care regimen Date Initiated: 12/14/2015 Goal Status:  Active Ulcer/skin breakdown will have a volume reduction of 30% by week 4 Date Initiated: 12/14/2015 Goal Status: Active Ulcer/skin breakdown will have a volume reduction of 50% by week 8 Date Initiated: 12/14/2015 Goal Status: Active Willie Krueger, Willie Krueger ([id number]) Ulcer/skin breakdown will have a volume reduction of 80% by week 12 Date Initiated: 12/14/2015 Goal Status: Active Ulcer/skin breakdown will heal within 14 weeks Date Initiated: 12/14/2015 Goal Status: Active Interventions: Assess patient/caregiver ability to obtain necessary supplies Assess patient/caregiver ability to perform ulcer/skin care regimen upon admission and as needed Assess ulceration(s) every visit Provide education on smoking Provide education on ulcer and skin care Treatment Activities: Refer to smoking cessation program : 12/14/2015 Referred to DME Ellawyn Wogan for dressing supplies : 12/14/2015 Skin care regimen initiated : 12/14/2015 Topical wound management initiated : 12/14/2015 Notes: Electronic Signature(s) Signed: 12/14/2015 3:55:28 PM By: Regan Lemming BSN, RN Entered By: Regan Lemming on 12/14/2015 09:40:45 Willie Krueger, Willie Krueger ([id number]) -------------------------------------------------------------------------------- Pain Assessment Details Patient Name: Willie Krueger, Willie Krueger. Date of Service: 12/14/2015 8:45 AM Medical Record Number: [id number] Patient Account Number: [id number] Date of Birth/Sex: Aug 13, 1956 (59 y.o. Male) Treating RN: Baruch Gouty, RN, BSN, Velva Harman Primary Care Physician: Juanell Fairly Other Clinician: Referring Physician: Juanell Fairly Treating Physician/Extender: Frann Rider in Treatment: 0 Active Problems Location of Pain Severity and Description of Pain Patient Has Paino No Site Locations Pain Management and Medication Current Pain Management: Electronic Signature(s) Signed: 12/14/2015 3:55:28 PM By: Regan Lemming BSN, RN Entered By: Regan Lemming on 12/14/2015 09:06:36 YUSUKE, LIMBACH  ([id number]) -------------------------------------------------------------------------------- Wound Assessment Details Patient Name: CLAUDELL, NOLDEN. Date of Service: 12/14/2015 8:45 AM Medical Record Number: [id number] Patient Account Number: [id number] Date of Birth/Sex: [birth year] (59 y.o. Male) Treating RN: Baruch Gouty, RN, BSN, Velva Harman Primary Care Physician: Juanell Fairly Other Clinician: Referring Physician: Juanell Fairly Treating Physician/Extender: Frann Rider in Treatment: 0 Wound Status Wound Number: 1 Primary  Diabetic Wound/Ulcer of the Lower Etiology: Extremity Wound Location: Right Toe Great Wound Open Wounding Event: Gradually Appeared Status: Date Acquired: 07/02/2015 Comorbid Anemia, Asthma, Hypertension, Type Weeks Of Treatment: 0 History: II Diabetes, Neuropathy Clustered Wound: No Photos Photo Uploaded By: Regan Lemming on 12/14/2015 14:18:06 Wound Measurements Length: (cm) 1 Width: (cm) 0.7 Depth: (cm) 0.1 Area: (cm) 0.55 Volume: (cm) 0.055 % Reduction in Area: 0% % Reduction in Volume: 0% Epithelialization: None Tunneling: No Undermining: No Wound Description Classification: Grade 1 Wound Margin: Distinct, outline attached Exudate Amount: None Present Foul Odor After Cleansing: No Wound Bed Granulation Amount: Medium (34-66%) Exposed Structure Granulation Quality: Pink, Pale Fascia Exposed: No Necrotic Amount: Small (1-33%) Fat Layer Exposed: No Necrotic Quality: Adherent Slough Tendon Exposed: No Muscle Exposed: No Joint Exposed: No BRAYTON, HUSBANDS ([id number]) Bone Exposed: No Limited to Skin Breakdown Periwound Skin Texture Texture Color No Abnormalities Noted: No No Abnormalities Noted: No Callus: No Atrophie Blanche: No Crepitus: No Cyanosis: No Excoriation: No Ecchymosis: No Fluctuance: No Erythema: No Friable: No Hemosiderin Staining: No Induration: No Mottled: No Localized Edema: No Pallor: No Rash: No Rubor:  No Scarring: No Temperature / Pain Moisture Temperature: No Abnormality No Abnormalities Noted: No Dry / Scaly: Yes Maceration: No Moist: No Wound Preparation Ulcer Cleansing: Rinsed/Irrigated with Saline Topical Anesthetic Applied: Other: lidocaine 4%, Treatment Notes Wound #1 (Right Toe Great) 1. Cleansed with: Cleanse wound with antibacterial soap and water 4. Dressing Applied: Aquacel Ag 5. Secondary Dressing Applied Foam Kerlix/Conform 6. Footwear/Offloading device applied Other footwear/offloading device applied (specify in notes) 7. Secured with Tape Notes Biochemist, clinical) Signed: 12/14/2015 3:55:28 PM By: Regan Lemming BSN, RN Entered By: Regan Lemming on 12/14/2015 09:22:52 DREX, PIERSOL ([id number]) -------------------------------------------------------------------------------- Vitals Details Patient Name: JASE, VOLKOV. Date of Service: 12/14/2015 8:45 AM Medical Record Number: [id number] Patient Account Number: [id number] Date of Birth/Sex: [birth year] (59 y.o. Male) Treating RN: Afful, RN, BSN, [place] Primary Care Physician: Juanell Fairly Other Clinician: Referring Physician: Juanell Fairly Treating Physician/Extender: Frann Rider in Treatment: 0 Vital Signs Time Taken: 09:18 Temperature (F): 98.1 Height (in): 74 Pulse (bpm): 78 Source: Stated Respiratory Rate (breaths/min): 18 Weight (lbs): 242 Blood Pressure (mmHg): 143/82 Source: Stated Reference Range: 80 - 120 mg / dl Body Mass Index (BMI): 31.1 Electronic Signature(s) Signed: 12/14/2015 3:55:28 PM By: Regan Lemming BSN, RN Entered By: Regan Lemming on 12/14/2015 09:18:46

## 2015-12-15 NOTE — Progress Notes (Signed)
CINQUE, RIAL ([id number]) Visit Report for 12/14/2015 HBO Risk Assessment Details Patient Name: Willie Krueger, Willie Krueger. Date of Service: 12/14/2015 8:45 AM Medical Record Number: [id number] Patient Account Number: [id number] Date of Birth/Sex: 1956/06/17 (59 y.o. Male) Treating RN: Baruch Gouty, RN, BSN, [place] Primary Care Physician: Juanell Fairly Other Clinician: Referring Physician: Juanell Fairly Treating Physician/Extender: Frann Rider in Treatment: 0 HBO Risk Assessment Items Answer Barotrauma Risks: Upper Respiratory Infections No Prior Radiation Treatment to Head/Neck No Tracheostomy No Ear problems or surgery (otosclerosis)- Consider pressure equalization tubes No Sinus Problems, Sinus Obstruction Yes Pulmonary Risks: Currently seeing a pulmonologisto No Emphysema No Tuberculosis No Other lung problems (COPD with CO2 retention, lesions, surgery) -Refer to CPGs No History of smoking Yes Bullous Disease, Blebs No Other pulmonary abnormalities No Cardiac Risks: Currently seeing a cardiologisto No Pacemaker/AICD No Hypertension Yes Diuretic Used (water pill). If yes, last time taken: Yes History of prior or current malignancy (Cancer) Surgery No Radiation therapy No Chemotherapy No Ophthalmic Risks: Optic Neuritis No Cataracts No Myopia No Retinopathy or Retinal Detachment Surgery- Consider pressure equalization tubes No ZVI, FURSE ([id number]) Confinement Anxiety Claustrophobia No Dialysis Dialysis No Any implants; medical or non-medical No Pregnancy No Diabetes HgbA1C within 3 months Yes Seizures Seizures No Currently using these medications: Aspirin Yes Digoxin (CHF patient) No Narcotics Yes Nitroprusside No Phenothiazine (Thorazine,etc.) No Disulfiram (Antabuse) No Mafenide Acetate (Sulfamylon-burn cream) No Amiodarone No Electronic Signature(s) Signed: 12/14/2015 3:55:28 PM By: Regan Lemming BSN, RN Entered By: Regan Lemming on 12/14/2015 10:14:20

## 2015-12-17 ENCOUNTER — Ambulatory Visit: Payer: 59 | Admitting: Internal Medicine

## 2015-12-18 ENCOUNTER — Ambulatory Visit: Payer: 59 | Admitting: Internal Medicine

## 2015-12-28 ENCOUNTER — Encounter (HOSPITAL_BASED_OUTPATIENT_CLINIC_OR_DEPARTMENT_OTHER): Payer: 59 | Admitting: General Surgery

## 2015-12-28 DIAGNOSIS — E1169 Type 2 diabetes mellitus with other specified complication: Secondary | ICD-10-CM | POA: Diagnosis not present

## 2015-12-28 DIAGNOSIS — E11621 Type 2 diabetes mellitus with foot ulcer: Secondary | ICD-10-CM | POA: Diagnosis not present

## 2015-12-28 DIAGNOSIS — M869 Osteomyelitis, unspecified: Secondary | ICD-10-CM

## 2015-12-28 DIAGNOSIS — L97512 Non-pressure chronic ulcer of other part of right foot with fat layer exposed: Secondary | ICD-10-CM | POA: Diagnosis not present

## 2015-12-28 NOTE — Progress Notes (Addendum)
Willie Krueger, Willie Krueger ([id number]) Visit Report for 12/28/2015 Chief Complaint Document Details Patient Name: Willie Krueger, Willie Krueger. Date of Service: 12/28/2015 10:45 AM Medical Record Number: [id number] Patient Account Number: [id number] Date of Birth/Sex: [birth year] (59 y.o. Male) Treating RN: Baruch Gouty, RN, BSN, Velva Harman Primary Care Physician: Juanell Fairly Other Clinician: Referring Physician: Juanell Fairly Treating Physician/Extender: Benjaman Pott in Treatment: 2 Information Obtained from: Patient Chief Complaint Patients presents for treatment of an open diabetic ulcer to his right big toe which she's had for several months Electronic Signature(s) Signed: 12/28/2015 10:55:48 AM By: Judene Companion MD Entered By: Judene Companion on 12/28/2015 10:55:47 Willie Krueger, Willie Krueger ([id number]) -------------------------------------------------------------------------------- HPI Details Patient Name: Willie Krueger, Willie Krueger. Date of Service: 12/28/2015 10:45 AM Medical Record Number: [id number] Patient Account Number: [id number] Date of Birth/Sex: [birth year] (59 y.o. Male) Treating RN: Baruch Gouty, RN, BSN, Velva Harman Primary Care Physician: Juanell Fairly Other Clinician: Referring Physician: Juanell Fairly Treating Physician/Extender: Benjaman Pott in Treatment: 2 History of Present Illness Location: ulcerated area on his right big toe Quality: Patient reports No Pain. Severity: Patient states wound are getting worse. Duration: Patient has had the wound for > 3 months prior to seeking treatment at the wound center Timing: Pain in wound is Intermittent (comes and goes Context: The wound appeared gradually over time Modifying Factors: Consults to this date include: infectious disease who has put him on IV antibiotics with a PICC line Associated Signs and Symptoms: Patient reports having difficulty standing for long periods. HPI Description: 59 year old Willie Krueger with multiple medical problems with a past medical history  of diabetes mellitus, hypertension, neuropathy, chronic pain and nicotine abuse. he was recently seen by Dr. Vincenza Hews of infectious disease at [facility] for a osteomyelitis of the distal phalanx of his right great toe. He also had diffuse cellulitis of the right lower leg. He has been advised to have a PICC line and will receive IV vancomycin and oral ciprofloxacin plus oral metronidazole. he had an MRI which was showing edema of the distal phalanx with probably represent osteomyelitis. He smokes a pack and a half of cigarettes a day. his last hemoglobin A1c was 8% A venous duplex of the right lower extremity was done recently on 11/30/2015 and this showed no evidence of DVT, superficial thrombophlebitis or incompetence. Electronic Signature(s) Signed: 12/28/2015 11:38:28 AM By: Judene Companion MD Entered By: Judene Companion on 12/28/2015 11:38:28 JEP, STJULIAN ([id number]) -------------------------------------------------------------------------------- Physical Exam Details Patient Name: Willie Krueger, Willie Krueger. Date of Service: 12/28/2015 10:45 AM Medical Record Number: [id number] Patient Account Number: [id number] Date of Birth/Sex: 23-Jan-1956 (59 y.o. Male) Treating RN: Baruch Gouty, RN, BSN, Velva Harman Primary Care Physician: Juanell Fairly Other Clinician: Referring Physician: Juanell Fairly Treating Physician/Extender: Benjaman Pott in Treatment: 2 Electronic Signature(s) Signed: 12/28/2015 11:38:38 AM By: Judene Companion MD Entered By: Judene Companion on 12/28/2015 11:38:38 Willie Krueger, Willie Krueger ([id number]) -------------------------------------------------------------------------------- Physician Orders Details Patient Name: HANY, GRANDE. Date of Service: 12/28/2015 10:45 AM Medical Record Number: [id number] Patient Account Number: [id number] Date of Birth/Sex: [birth year] (59 y.o. Male) Treating RN: Baruch Gouty, RN, BSN, Velva Harman Primary Care Physician: Juanell Fairly Other Clinician: Referring Physician:  Juanell Fairly Treating Physician/Extender: Benjaman Pott in Treatment: 2 Verbal / Phone Orders: Yes Clinician: Afful, RN, BSN, Rita Read Back and Verified: Yes Diagnosis Coding ICD-10 Coding Code Description E11.621 Type 2 diabetes mellitus with foot ulcer L97.514 Non-pressure chronic ulcer of other part of right foot with necrosis of bone M86.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, right ankle and foot F17.218 Nicotine dependence, cigarettes, with other  nicotine-induced disorders L84 Corns and callosities Wound Cleansing Wound #1 Right Toe Great o Cleanse wound with mild soap and water o May Shower, gently pat wound dry prior to applying new dressing. o May shower with protection. Anesthetic Wound #1 Right Toe Great o Topical Lidocaine 4% cream applied to wound bed prior to debridement Primary Wound Dressing Wound #1 Right Toe Great o Aquacel Ag Secondary Dressing Wound #1 Right Toe Great o Gauze and Kerlix/Conform Dressing Change Frequency Wound #1 Right Toe Great o Change dressing every other day. Follow-up Appointments Wound #1 Right Toe Great o Return Appointment in 1 week. Willie Krueger, Willie Krueger ([id number]) Off-Loading Wound #1 Right Toe Great o Open toe surgical shoe with peg assist. o Other: - felt Electronic Signature(s) Signed: 12/28/2015 12:59:18 PM By: Regan Lemming BSN, RN Signed: 12/28/2015 1:09:45 PM By: Judene Companion MD Entered By: Regan Lemming on 12/28/2015 11:04:42 Willie Krueger, Willie Krueger ([id number]) -------------------------------------------------------------------------------- Problem List Details Patient Name: Willie Krueger, Willie Krueger. Date of Service: 12/28/2015 10:45 AM Medical Record Number: [id number] Patient Account Number: [id number] Date of Birth/Sex: [birth year] (59 y.o. 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 other part of right foot with 12/14/2015 Yes necrosis of bone M86.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, right ankle and foot 12/14/2015 Yes F17.218 Nicotine dependence, cigarettes, with other nicotine- 12/14/2015 Yes induced disorders L84 Corns and callosities 12/14/2015 Yes Inactive Problems Resolved Problems Electronic Signature(s) Signed: 12/28/2015 10:55:38 AM By: Judene Companion MD Entered By: Judene Companion on 12/28/2015 10:55:38 Willie Krueger, Willie Krueger ([id number]) -------------------------------------------------------------------------------- Progress Note Details Patient Name: Willie Krueger, Willie Krueger. Date of Service: 12/28/2015 10:45 AM Medical Record Number: [id number] Patient Account Number: [id number] Date of Birth/Sex: 06-Jul-1956 (59 y.o. Male) Treating RN: Baruch Gouty, RN, BSN, Velva Harman Primary Care Physician: Juanell Fairly Other Clinician: Referring Physician: Juanell Fairly Treating Physician/Extender: Benjaman Pott in Treatment: 2 Subjective Chief Complaint Information obtained from Patient Patients presents for treatment of an open diabetic ulcer to his right big toe which she's had for several months History of Present Illness (HPI) The following HPI elements were documented for the patient's wound: Location: ulcerated area on his right big toe Quality: Patient reports No Pain. Severity: Patient states wound are getting worse. Duration: Patient has had the wound for > 3 months prior to seeking treatment at the wound center Timing: Pain in wound is Intermittent (comes and goes Context: The wound appeared gradually over time Modifying Factors: Consults to this date include: infectious disease who has put him on IV antibiotics with a PICC line Associated Signs and Symptoms: Patient  reports having difficulty standing for long periods. 59 year old Willie Krueger with multiple medical problems with a past medical history of diabetes mellitus, hypertension, neuropathy, chronic pain and nicotine abuse. he was recently seen by Dr. Vincenza Hews of infectious disease at [facility] for a osteomyelitis of the distal phalanx of his right great toe. He also had diffuse cellulitis of the right lower leg. He has been advised to have a PICC line and will receive IV vancomycin and oral ciprofloxacin plus oral metronidazole. he had an MRI which was showing edema of the distal phalanx with probably represent osteomyelitis. He smokes a pack and a half of cigarettes a day. his last hemoglobin A1c was 8% A venous duplex of the right lower  extremity was done recently on 11/30/2015 and this showed no evidence of DVT, superficial thrombophlebitis or incompetence. Objective Constitutional Vitals Time Taken: 10:51 AM, Height: 74 in, Weight: 242 lbs, BMI: 31.1, Temperature: 98.1 F, Pulse: 86 bpm, Respiratory Rate: 18 breaths/min, Blood Pressure: 151/88 mmHg. Willie Krueger, Willie Krueger [place]. ([id number]) Integumentary (Hair, Skin) Wound #1 status is Open. Original cause of wound was Gradually Appeared. The wound is located on the Right Toe Great. The wound measures 0.8cm length x 0.7cm width x 0.1cm depth; 0.44cm^2 area and 0.044cm^3 volume. The wound is limited to skin breakdown. There is no tunneling or undermining noted. There is a small amount of drainage noted. The wound margin is distinct with the outline attached to the wound base. There is medium (34-66%) pink, pale granulation within the wound bed. There is a small (1-33%) amount of necrotic tissue within the wound bed including Adherent Slough. The periwound skin appearance exhibited: Localized Edema, Dry/Scaly, Moist. The periwound skin appearance did not exhibit: Callus, Crepitus, Excoriation, Fluctuance, Friable, Induration, Rash, Scarring, Maceration,  Atrophie Blanche, Cyanosis, Ecchymosis, Hemosiderin Staining, Mottled, Pallor, Rubor, Erythema. Periwound temperature was noted as No Abnormality. 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 May shower with protection. Anesthetic: Wound #1 Right Toe Great: Topical Lidocaine 4% cream applied to wound bed prior to debridement Primary Wound Dressing: Wound #1 Right Toe Great: Aquacel Ag Secondary Dressing: Wound #1 Right Toe Great: Gauze and Kerlix/Conform Dressing Change FrequencyRODRIGO, PEZZULLO ([id number]) Wound #1 Right Toe Great: Change dressing every other day. Follow-up Appointments: Wound #1 Right Toe Great: Return Appointment in 1 week. Off-Loading: Wound #1 Right Toe Great: Open toe surgical shoe with peg assist. Other: - felt Osteo right great toe. On iv antibiotics. Considering HBO. Electronic Signature(s) Signed: 12/28/2015 11:43:31 AM By: Judene Companion MD Entered By: Judene Companion on 12/28/2015 11:43:30 CAMRYNN, MACARI ([id number]) -------------------------------------------------------------------------------- [place] Details Patient Name: PECOS, MIMNAUGH. Date of Service: 12/28/2015 Medical Record Number: [id number] Patient Account Number: [id number] Date of Birth/Sex: [birth year] (59 y.o. Male) Treating RN: Baruch Gouty, RN, BSN, [place] Primary Care Physician: Juanell Fairly Other Clinician: Referring Physician: Juanell Fairly Treating Physician/Extender: Benjaman Pott in Treatment: 2 Diagnosis Coding ICD-10  Codes Code Description E11.621 Type 2 diabetes mellitus with foot ulcer L97.514 Non-pressure chronic ulcer of other part of right foot with necrosis of bone M86.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, right ankle and foot F17.218 Nicotine dependence, cigarettes, with other nicotine-induced disorders L84 Corns and callosities Facility Procedures CPT4 Code: [id number] Description: [id number] - WOUND CARE VISIT-LEV 2 EST PT Modifier: Quantity: 1 Physician Procedures CPT4 Code: [id number] Description: [id number] - WC PHYS LEVEL 2 - EST PT ICD-10 Description Diagnosis M86.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, right ankle an Modifier: d foot Quantity: 1 Electronic Signature(s) Signed: 12/28/2015 11:44:11 AM By: Judene Companion MD Entered By: Judene Companion on 12/28/2015 11:44:11

## 2015-12-28 NOTE — Progress Notes (Signed)
seeiheal 

## 2015-12-29 NOTE — Progress Notes (Signed)
ELYES, WHITESELL ([id number]) Visit Report for 12/28/2015 Arrival Information Details Patient Name: Willie Krueger, Willie Krueger. Date of Service: 12/28/2015 10:45 AM Medical Record Number: [id number] Patient Account Number: [id number] Date of Birth/Sex: [birth year] (59 y.o. Male) Treating RN: Baruch Gouty, RN, BSN, Velva Harman Primary Care Physician: Juanell Fairly Other Clinician: Referring Physician: Juanell Fairly Treating Physician/Extender: Benjaman Pott in Treatment: 2 Visit Information History Since Last Visit Any new allergies or adverse reactions: No Patient Arrived: Ambulatory Had a fall or experienced change in No Arrival Time: 10:50 activities of daily living that may affect Accompanied By: self risk of falls: Transfer Assistance: None Signs or symptoms of abuse/neglect since last No Patient Identification Verified: Yes visito Secondary Verification Process Yes Hospitalized since last visit: No Completed: Has Dressing in Place as Prescribed: Yes Patient Requires Transmission- No Pain Present Now: No Based Precautions: Patient Has Alerts: Yes Patient Alerts: Patient on Blood Thinner Aspirin, Dm2 Electronic Signature(s) Signed: 12/28/2015 12:59:18 PM By: Regan Lemming BSN, RN Entered By: Regan Lemming on 12/28/2015 10:51:14 [name] ([id number]) -------------------------------------------------------------------------------- Clinic Level of Care Assessment Details Patient Name: Willie Krueger. Date of Service: 12/28/2015 10:45 AM Medical Record Number: [id number] Patient Account Number: [id number] Date of Birth/Sex: Dec 13, 1956 (58 y.o. Male) Treating RN: Baruch Gouty, RN, BSN, [place] Primary Care Physician: Juanell Fairly Other Clinician: Referring Physician: Juanell Fairly Treating Physician/Extender: Benjaman Pott in Treatment: 2 Clinic Level of Care Assessment Items TOOL 4 Quantity Score []  - Use when only an EandM is performed on FOLLOW-UP visit 0 ASSESSMENTS - Nursing Assessment /  Reassessment X - Reassessment of Co-morbidities (includes updates in patient status) 1 10 X - Reassessment of Adherence to Treatment Plan 1 5 ASSESSMENTS - Wound and Skin Assessment / Reassessment X - Simple Wound Assessment / Reassessment - one wound 1 5 []  - Complex Wound Assessment / Reassessment - multiple wounds 0 []  - Dermatologic / Skin Assessment (not related to wound area) 0 ASSESSMENTS - Focused Assessment []  - Circumferential Edema Measurements - multi extremities 0 []  - Nutritional Assessment / Counseling / Intervention 0 X - Lower Extremity Assessment (monofilament, tuning fork, pulses) 1 5 []  - Peripheral Arterial Disease Assessment (using hand held doppler) 0 ASSESSMENTS - Ostomy and/or Continence Assessment and Care []  - Incontinence Assessment and Management 0 []  - Ostomy Care Assessment and Management (repouching, etc.) 0 PROCESS - Coordination of Care X - Simple Patient / Family Education for ongoing care 1 15 []  - Complex (extensive) Patient / Family Education for ongoing care 0 []  - Staff obtains Programmer, systems, Records, Test Results / Process Orders 0 []  - Staff telephones HHA, Nursing Homes / Clarify orders / etc 0 []  - Routine Transfer to another Facility (non-emergent condition) 0 MASOOD, STCLAIR ([id number]) []  - Routine Hospital Admission (non-emergent condition) 0 []  - New Admissions / Biomedical engineer / Ordering NPWT, Apligraf, etc. 0 []  - Emergency Hospital Admission (emergent condition) 0 []  - Simple Discharge Coordination 0 []  - Complex (extensive) Discharge Coordination 0 PROCESS - Special Needs []  - Pediatric / Minor Patient Management 0 []  - Isolation Patient Management 0 []  - Hearing / Language / Visual special needs 0 []  - Assessment of Community assistance (transportation, D/C planning, etc.) 0 []  - Additional assistance / Altered mentation 0 []  - Support Surface(s) Assessment (bed, cushion, seat, etc.) 0 INTERVENTIONS - Wound Cleansing /  Measurement X - Simple Wound Cleansing - one wound 1 5 []  - Complex Wound Cleansing - multiple wounds 0 X - Wound Imaging (photographs -  any number of wounds) 1 5 []  - Wound Tracing (instead of photographs) 0 X - Simple Wound Measurement - one wound 1 5 []  - Complex Wound Measurement - multiple wounds 0 INTERVENTIONS - Wound Dressings X - Small Wound Dressing one or multiple wounds 1 10 []  - Medium Wound Dressing one or multiple wounds 0 []  - Large Wound Dressing one or multiple wounds 0 []  - Application of Medications - topical 0 []  - Application of Medications - injection 0 INTERVENTIONS - Miscellaneous []  - External ear exam 0 KEYUN, TRENARY ([id number]) []  - Specimen Collection (cultures, biopsies, blood, body fluids, etc.) 0 []  - Specimen(s) / Culture(s) sent or taken to Lab for analysis 0 []  - Patient Transfer (multiple staff / Harrel Lemon Lift / Similar devices) 0 []  - Simple Staple / Suture removal (25 or less) 0 []  - Complex Staple / Suture removal (26 or more) 0 []  - Hypo / Hyperglycemic Management (close monitor of Blood Glucose) 0 []  - Ankle / Brachial Index (ABI) - do not check if billed separately 0 X - Vital Signs 1 5 Has the patient been seen at the hospital within the last three years: Yes Total Score: 70 Level Of Care: New/Established - Level 2 Electronic Signature(s) Signed: 12/28/2015 12:59:18 PM By: Regan Lemming BSN, RN Entered By: Regan Lemming on 12/28/2015 11:05:15 [name] ([id number]) -------------------------------------------------------------------------------- Encounter Discharge Information Details Patient Name: Willie Krueger. Date of Service: 12/28/2015 10:45 AM Medical Record Number: [id number] Patient Account Number: [id number] Date of Birth/Sex: Nov 06, 1956 (59 y.o. Male) Treating RN: Baruch Gouty, RN, BSN, Velva Harman Primary Care Physician: Juanell Fairly Other Clinician: Referring Physician: Juanell Fairly Treating Physician/Extender: Benjaman Pott in  Treatment: 2 Encounter Discharge Information Items Discharge Pain Level: 0 Discharge Condition: Stable Ambulatory Status: Ambulatory Discharge Destination: Home Private Transportation: Auto Accompanied By: self Schedule Follow-up Appointment: No Medication Reconciliation completed and No provided to Patient/Care [place]: Clinical Summary of Care: Electronic Signature(s) Signed: 12/28/2015 12:59:18 PM By: Regan Lemming BSN, RN Entered By: Regan Lemming on 12/28/2015 11:00:59 [name] ([id number]) -------------------------------------------------------------------------------- Lower Extremity Assessment Details Patient Name: AZLAN, AUGUSTO. Date of Service: 12/28/2015 10:45 AM Medical Record Number: [id number] Patient Account Number: [id number] Date of Birth/Sex: 03-Apr-1956 (59 y.o. Male) Treating RN: Baruch Gouty, RN, BSN, [place] Harman Primary Care Physician: Juanell Fairly Other Clinician: Referring Physician: Juanell Fairly Treating Physician/Extender: Benjaman Pott in Treatment: 2 Vascular Assessment Pulses: Posterior Tibial Dorsalis Pedis Palpable: [Right:Yes] Extremity colors, hair growth, and conditions: Extremity Color: [Right:Normal] Hair Growth on Extremity: [Right:Yes] Temperature of Extremity: [Right:Warm] Capillary Refill: [Right:< 3 seconds] Toe Nail Assessment Left: Right: Thick: Yes Discolored: Yes Deformed: No Improper Length and Hygiene: Yes Electronic Signature(s) Signed: 12/28/2015 12:59:18 PM By: Regan Lemming BSN, RN Entered By: Regan Lemming on 12/28/2015 10:56:41 KENDRE, KOLLMAN ([id number]) -------------------------------------------------------------------------------- Multi Wound Chart Details Patient Name: OSSIE, SEBO. Date of Service: 12/28/2015 10:45 AM Medical Record Number: [id number] Patient Account Number: [id number] Date of Birth/Sex: 10/09/1956 (59 y.o. Male) Treating RN: Baruch Gouty, RN, BSN, Velva Harman Primary Care Physician: Juanell Fairly Other  Clinician: Referring Physician: Juanell Fairly Treating Physician/Extender: Benjaman Pott in Treatment: 2 Vital Signs Height(in): 74 Pulse(bpm): 86 Weight(lbs): 242 Blood Pressure 151/88 (mmHg): Body Mass Index(BMI): 31 Temperature(F): 98.1 Respiratory Rate 18 (breaths/min): Photos: [1:No Photos] [N/A:N/A] Wound Location: [1:Right Toe Great] [N/A:N/A] Wounding Event: [1:Gradually Appeared] [N/A:N/A] Primary Etiology: [1:Diabetic Wound/Ulcer of the Lower Extremity] [N/A:N/A] Comorbid History: [1:Anemia, Asthma, Hypertension, Type II Diabetes, Neuropathy] [N/A:N/A] Date Acquired: [1:07/02/2015] [N/A:N/A] Weeks of Treatment: [  1:2] [N/A:N/A] Wound Status: [1:Open] [N/A:N/A] Measurements L x W x D 0.8x0.7x0.1 [N/A:N/A] (cm) Area (cm) : [1:0.44] [N/A:N/A] Volume (cm) : [1:0.044] [N/A:N/A] % Reduction in Area: [1:20.00%] [N/A:N/A] % Reduction in Volume: 20.00% [N/A:N/A] Classification: [1:Grade 1] [N/A:N/A] Exudate Amount: [1:Small] [N/A:N/A] Wound Margin: [1:Distinct, outline attached] [N/A:N/A] Granulation Amount: [1:Medium (34-66%)] [N/A:N/A] Granulation Quality: [1:Pink, Pale] [N/A:N/A] Necrotic Amount: [1:Small (1-33%)] [N/A:N/A] Exposed Structures: [1:Fascia: No Fat: No Tendon: No Muscle: No Joint: No Bone: No] [N/A:N/A] Limited to Skin Breakdown Epithelialization: None N/A N/A Periwound Skin Texture: Edema: Yes N/A N/A Excoriation: No Induration: No Callus: No Crepitus: No Fluctuance: No Friable: No Rash: No Scarring: No Periwound Skin Moist: Yes N/A N/A Moisture: Dry/Scaly: Yes Maceration: No Periwound Skin Color: Atrophie Blanche: No N/A N/A Cyanosis: No Ecchymosis: No Erythema: No Hemosiderin Staining: No Mottled: No Pallor: No Rubor: No Temperature: No Abnormality N/A N/A Tenderness on No N/A N/A Palpation: Wound Preparation: Ulcer Cleansing: N/A N/A Rinsed/Irrigated with Saline Topical Anesthetic Applied: Other: lidocaine 4% Treatment  Notes Electronic Signature(s) Signed: 12/28/2015 12:59:18 PM By: Regan Lemming BSN, RN Entered By: Regan Lemming on 12/28/2015 11:00:22 KEIFER, HAUTER ([id number]) -------------------------------------------------------------------------------- [place] Details Patient Name: RAWSON, RUPINSKI. Date of Service: 12/28/2015 10:45 AM Medical Record Number: [id number] Patient Account Number: [id number] Date of Birth/Sex: Sep 10, 1956 (59 y.o. Male) Treating RN: Baruch Gouty, RN, BSN, [place] Primary Care Physician: Juanell Fairly Other Clinician: Referring Physician: Juanell Fairly Treating Physician/Extender: Benjaman Pott in Treatment: 2 Active Inactive HBO Nursing Diagnoses: Anxiety related to feelings of confinement associated with the hyperbaric oxygen chamber Anxiety related to knowledge deficit of hyperbaric oxygen therapy and treatment procedures Discomfort related to temperature and humidity changes inside hyperbaric chamber Potential for barotraumas to ears, sinuses, teeth, and lungs or cerebral gas embolism related to changes in atmospheric pressure inside hyperbaric oxygen chamber Potential for oxygen toxicity seizures related to delivery of 100% oxygen at an increased atmospheric pressure Potential for pulmonary oxygen toxicity related to delivery of 100% oxygen at an increased atmospheric pressure Goals: Barotrauma will be prevented during HBO2 Date Initiated: 12/14/2015 Goal Status: Active Patient and/or family will be able to state/discuss factors appropriate to the management of their disease process during treatment Date Initiated: 12/14/2015 Goal Status: Active Patient will tolerate the hyperbaric oxygen therapy treatment Date Initiated: 12/14/2015 Goal Status: Active Patient will tolerate the internal climate of the chamber Date Initiated: 12/14/2015 Goal Status: Active Patient/caregiver will verbalize understanding of HBO goals, rationale, procedures and  potential hazards Date Initiated: 12/14/2015 Goal Status: Active Signs and symptoms of pulmonary oxygen toxicity will be recognized and promptly addressed Date Initiated: 12/14/2015 Goal Status: Active Signs and symptoms of seizure will be recognized and promptly addressed ; seizing patients will suffer no harm Date Initiated: 12/14/2015 KENDRA, KRZYWICKI ([id number]) Goal Status: Active Interventions: Administer a five (5) minute air break for patient if signs and symptoms of seizure appear and notify the hyperbaric physician Administer a ten (10) minute air break for patient if signs and symptoms of seizure appear and notify the hyperbaric physician Administer decongestants, per physician orders, prior to HBO2 Administer the correct therapeutic gas delivery based on the patients needs and limitations, per physician order Assess and provide for patientos comfort related to the hyperbaric environment and equalization of middle ear Assess for signs and symptoms related to adverse events, including but not limited to confinement anxiety, pneumothorax, oxygen toxicity and baurotrauma Assess patient for any history of confinement anxiety Assess patient's knowledge and expectations regarding hyperbaric medicine  and provide education related to the hyperbaric environment, goals of treatment and prevention of adverse events Implement protocols to decrease risk of pneumothorax in high risk patients Notes: Orientation to the Wound Care Program Nursing Diagnoses: Knowledge deficit related to the wound healing center program Goals: Patient/caregiver will verbalize understanding of the [facility] Program Date Initiated: 12/14/2015 Goal Status: Active Interventions: Provide education on orientation to the wound center Notes: Osteomyelitis Nursing Diagnoses: Infection: osteomyelitis Knowledge deficit related to disease process and management Potential for infection:  osteomyelitis Goals: Diagnostic evaluation for osteomyelitis completed as ordered Date Initiated: 12/14/2015 Goal Status: Active MISTY, BROOKS ([id number]) Patient/caregiver will verbalize understanding of disease process and disease management Date Initiated: 12/14/2015 Goal Status: Active Patient's osteomyelitis will resolve Date Initiated: 12/14/2015 Goal Status: Active Signs and symptoms for osteomyelitis will be recognized and promptly addressed Date Initiated: 12/14/2015 Goal Status: Active Interventions: Assess for signs and symptoms of osteomyelitis resolution every visit Provide education on osteomyelitis Screen for HBO Treatment Activities: Consult for HBO : 12/28/2015 MRI : 12/28/2015 Systemic antibiotics : 12/28/2015 Notes: Wound/Skin Impairment Nursing Diagnoses: Impaired tissue integrity Knowledge deficit related to smoking impact on wound healing Knowledge deficit related to ulceration/compromised skin integrity Goals: Patient will demonstrate a reduced rate of smoking or cessation of smoking Date Initiated: 12/14/2015 Goal Status: Active Patient will have a decrease in wound volume by X% from date: (specify in notes) Date Initiated: 12/14/2015 Goal Status: Active Patient/caregiver will verbalize understanding of skin care regimen Date Initiated: 12/14/2015 Goal Status: Active Ulcer/skin breakdown will have a volume reduction of 30% by week 4 Date Initiated: 12/14/2015 Goal Status: Active Ulcer/skin breakdown will have a volume reduction of 50% by week 8 Date Initiated: 12/14/2015 Goal Status: Active MARKEVIOUS, ZALOUDEK ([id number]) Ulcer/skin breakdown will have a volume reduction of 80% by week 12 Date Initiated: 12/14/2015 Goal Status: Active Ulcer/skin breakdown will heal within 14 weeks Date Initiated: 12/14/2015 Goal Status: Active Interventions: Assess patient/caregiver ability to obtain necessary supplies Assess patient/caregiver ability to  perform ulcer/skin care regimen upon admission and as needed Assess ulceration(s) every visit Provide education on smoking Provide education on ulcer and skin care Treatment Activities: Refer to smoking cessation program : 12/28/2015 Referred to DME Rudell Marlowe for dressing supplies : 12/28/2015 Skin care regimen initiated : 12/28/2015 Topical wound management initiated : 12/28/2015 Notes: Electronic Signature(s) Signed: 12/28/2015 12:59:18 PM By: Regan Lemming BSN, RN Entered By: Regan Lemming on 12/28/2015 10:59:54 LAMEEK, GOWER ([id number]) -------------------------------------------------------------------------------- Pain Assessment Details Patient Name: NIKO, PALM. Date of Service: 12/28/2015 10:45 AM Medical Record Number: [id number] Patient Account Number: [id number] Date of Birth/Sex: 1956/04/17 (59 y.o. Male) Treating RN: Baruch Gouty, RN, BSN, Velva Harman Primary Care Physician: Juanell Fairly Other Clinician: Referring Physician: Juanell Fairly Treating Physician/Extender: Benjaman Pott in Treatment: 2 Active Problems Location of Pain Severity and Description of Pain Patient Has Paino No Site Locations Pain Management and Medication Current Pain Management: Electronic Signature(s) Signed: 12/28/2015 12:59:18 PM By: Regan Lemming BSN, RN Entered By: Regan Lemming on 12/28/2015 10:51:22 [name] ([id number]) -------------------------------------------------------------------------------- Patient/Caregiver Education Details Patient Name: ARGELIS, FAVREAU. Date of Service: 12/28/2015 10:45 AM Medical Record Number: [id number] Patient Account Number: [id number] Date of Birth/Gender: Feb 05, 1956 (59 y.o. Male) Treating RN: Baruch Gouty, RN, BSN, Velva Harman Primary Care Physician: Juanell Fairly Other Clinician: Referring Physician: Juanell Fairly Treating Physician/Extender: Benjaman Pott in Treatment: 2 Education Assessment Education Provided To: Patient Education Topics  Provided Infection: Methods: Explain/Verbal Responses: State content correctly Smoking and Wound Healing: Methods: Explain/Verbal  Responses: State content correctly Welcome To [facility]: Methods: Explain/Verbal Responses: State content correctly Wound/Skin Impairment: Methods: Explain/Verbal Responses: State content correctly Electronic Signature(s) Signed: 12/28/2015 12:59:18 PM By: Regan Lemming BSN, RN Entered By: Regan Lemming on 12/28/2015 11:01:21 NIKOLA, CAMERON ([id number]) -------------------------------------------------------------------------------- Wound Assessment Details Patient Name: ARIEON, PHILIP. Date of Service: 12/28/2015 10:45 AM Medical Record Number: [id number] Patient Account Number: [id number] Date of Birth/Sex: [birth year] (59 y.o. Male) Treating RN: Baruch Gouty, RN, BSN, [place] Primary Care Physician: Juanell Fairly Other Clinician: Referring Physician: Juanell Fairly Treating Physician/Extender: Benjaman Pott in Treatment: 2 Wound Status Wound Number: 1 Primary Diabetic Wound/Ulcer of the Lower Etiology: Extremity Wound Location: Right Toe Great Wound Open Wounding Event: Gradually Appeared Status: Date Acquired: 07/02/2015 Comorbid Anemia, Asthma, Hypertension, Type Weeks Of Treatment: 2 History: II Diabetes, Neuropathy Clustered Wound: No Photos Photo Uploaded By: Regan Lemming on 12/28/2015 12:57:30 Wound Measurements Length: (cm) 0.8 Width: (cm) 0.7 Depth: (cm) 0.1 Area: (cm) 0.44 Volume: (cm) 0.044 % Reduction in Area: 20% % Reduction in Volume: 20% Epithelialization: None Tunneling: No Undermining: No Wound Description Classification: Grade 1 Wound Margin: Distinct, outline attached Exudate Amount: Small Foul Odor After Cleansing: No Wound Bed Granulation Amount: Medium (34-66%) Exposed Structure Granulation Quality: Pink, Pale Fascia Exposed: No Necrotic Amount: Small (1-33%) Fat Layer Exposed: No Necrotic Quality:  Adherent Slough Tendon Exposed: No Muscle Exposed: No Joint Exposed: No DANNIE, BARRESI ([id number]) Bone Exposed: No Limited to Skin Breakdown Periwound Skin Texture Texture Color No Abnormalities Noted: No No Abnormalities Noted: No Callus: No Atrophie Blanche: No Crepitus: No Cyanosis: No Excoriation: No Ecchymosis: No Fluctuance: No Erythema: No Friable: No Hemosiderin Staining: No Induration: No Mottled: No Localized Edema: Yes Pallor: No Rash: No Rubor: No Scarring: No Temperature / Pain Moisture Temperature: No Abnormality No Abnormalities Noted: No Dry / Scaly: Yes Maceration: No Moist: Yes Wound Preparation Ulcer Cleansing: Rinsed/Irrigated with Saline Topical Anesthetic Applied: Other: lidocaine 4%, Treatment Notes Wound #1 (Right Toe Great) 1. Cleansed with: Cleanse wound with antibacterial soap and water 3. Peri-wound Care: Moisturizing lotion 4. Dressing Applied: Aquacel Ag 5. Secondary Dressing Applied Gauze and Kerlix/Conform 7. Secured with Tape Notes Biochemist, clinical) Signed: 12/28/2015 12:59:18 PM By: Regan Lemming BSN, RN Entered By: Regan Lemming on 12/28/2015 10:57:08 KAEDAN, DIGGLES ([id number]) -------------------------------------------------------------------------------- Vitals Details Patient Name: TORRI, HIRT. Date of Service: 12/28/2015 10:45 AM Medical Record Number: [id number] Patient Account Number: [id number] Date of Birth/Sex: 03/20/1956 (59 y.o. Male) Treating RN: Baruch Gouty, RN, BSN, [place] Primary Care Physician: Juanell Fairly Other Clinician: Referring Physician: Juanell Fairly Treating Physician/Extender: Benjaman Pott in Treatment: 2 Vital Signs Time Taken: 10:51 Temperature (F): 98.1 Height (in): 74 Pulse (bpm): 86 Weight (lbs): 242 Respiratory Rate (breaths/min): 18 Body Mass Index (BMI): 31.1 Blood Pressure (mmHg): 151/88 Reference Range: 80 - 120 mg / dl Electronic Signature(s) Signed:  12/28/2015 12:59:18 PM By: Regan Lemming BSN, RN Entered By: Regan Lemming on 12/28/2015 10:54:12

## 2016-01-11 ENCOUNTER — Encounter: Payer: 59 | Attending: Surgery | Admitting: Surgery

## 2016-01-11 DIAGNOSIS — L97514 Non-pressure chronic ulcer of other part of right foot with necrosis of bone: Secondary | ICD-10-CM | POA: Diagnosis not present

## 2016-01-11 DIAGNOSIS — L84 Corns and callosities: Secondary | ICD-10-CM | POA: Insufficient documentation

## 2016-01-11 DIAGNOSIS — E1140 Type 2 diabetes mellitus with diabetic neuropathy, unspecified: Secondary | ICD-10-CM | POA: Insufficient documentation

## 2016-01-11 DIAGNOSIS — Z720 Tobacco use: Secondary | ICD-10-CM | POA: Diagnosis not present

## 2016-01-11 DIAGNOSIS — F17218 Nicotine dependence, cigarettes, with other nicotine-induced disorders: Secondary | ICD-10-CM | POA: Diagnosis not present

## 2016-01-11 DIAGNOSIS — E11621 Type 2 diabetes mellitus with foot ulcer: Secondary | ICD-10-CM | POA: Diagnosis present

## 2016-01-11 DIAGNOSIS — I10 Essential (primary) hypertension: Secondary | ICD-10-CM | POA: Insufficient documentation

## 2016-01-11 DIAGNOSIS — M86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, right ankle and foot: Secondary | ICD-10-CM | POA: Diagnosis not present

## 2016-01-12 NOTE — Progress Notes (Addendum)
Willie Krueger ([id number]) Visit Report for 01/11/2016 Arrival Information Details Patient Name: Willie Krueger, Willie Krueger. Date of Service: 01/11/2016 10:45 AM Medical Record Number: [id number] Patient Account Number: [id number] Date of Birth/Sex: 1956/04/10 (60 y.o. Male) Treating RN: Baruch Gouty, RN, BSN, Velva Harman Primary Care Physician: Juanell Fairly Other Clinician: Referring Physician: Juanell Fairly Treating Physician/Extender: Frann Rider in Treatment: 4 Visit Information History Since Last Visit Added or deleted any medications: No Patient Arrived: Ambulatory Any new allergies or adverse reactions: No Arrival Time: 11:04 Had a fall or experienced change in No Accompanied By: self activities of daily living that may affect Transfer Assistance: None risk of falls: Patient Identification Verified: Yes Signs or symptoms of abuse/neglect since last No Secondary Verification Process Yes visito Completed: Hospitalized since last visit: No Patient Requires Transmission- No Has Dressing in Place as Prescribed: Yes Based Precautions: Pain Present Now: No Patient Has Alerts: Yes Patient Alerts: Patient on Blood Thinner Aspirin, Dm2 Electronic Signature(s) Signed: 01/11/2016 2:53:54 PM By: Regan Lemming BSN, RN Entered By: Regan Lemming on 01/11/2016 11:04:56 [name] ([id number]) -------------------------------------------------------------------------------- Encounter Discharge Information Details Patient Name: Willie Krueger. Date of Service: 01/11/2016 10:45 AM Medical Record Number: [id number] Patient Account Number: [id number] Date of Birth/Sex: May 25, 1956 (60 y.o. Male) Treating RN: Baruch Gouty, RN, BSN, Velva Harman Primary Care Physician: Juanell Fairly Other Clinician: Referring Physician: Juanell Fairly Treating Physician/Extender: Frann Rider in Treatment: 4 Encounter Discharge Information Items Discharge Pain Level: 0 Discharge Condition: Stable Ambulatory Status:  Ambulatory Discharge Destination: Home Transportation: Private Auto Accompanied By: self Schedule Follow-up Appointment: No Medication Reconciliation completed and provided to Patient/Care No Onnika Siebel: Provided on Clinical Summary of Care: 01/11/2016 Form Type Recipient Paper Patient GM Electronic Signature(s) Signed: 01/11/2016 11:41:29 AM By: Ruthine Dose Entered By: Ruthine Dose on 01/11/2016 11:41:29 [name] ([id number]) -------------------------------------------------------------------------------- Lower Extremity Assessment Details Patient Name: Willie Krueger. Date of Service: 01/11/2016 10:45 AM Medical Record Number: [id number] Patient Account Number: [id number] Date of Birth/Sex: 06/06/1956 (60 y.o. Male) Treating RN: Baruch Gouty, RN, BSN, Velva Harman Primary Care Physician: Juanell Fairly Other Clinician: Referring Physician: Juanell Fairly Treating Physician/Extender: Frann Rider in Treatment: 4 Vascular Assessment Pulses: Posterior Tibial Dorsalis Pedis Palpable: [Right:Yes] Extremity colors, hair growth, and conditions: Extremity Color: [Right:Dusky] Hair Growth on Extremity: [Right:Yes] Temperature of Extremity: [Right:Warm] Capillary Refill: [Right:< 3 seconds] Toe Nail Assessment Left: Right: Thick: Yes Discolored: Yes Deformed: No Improper Length and Hygiene: Yes Electronic Signature(s) Signed: 01/11/2016 2:53:54 PM By: Regan Lemming BSN, RN Entered By: Regan Lemming on 01/11/2016 11:05:39 Willie Krueger ([id number]) -------------------------------------------------------------------------------- Multi Wound Chart Details Patient Name: Willie Krueger. Date of Service: 01/11/2016 10:45 AM Medical Record Number: [id number] Patient Account Number: [id number] Date of Birth/Sex: 1956-01-23 (60 y.o. Male) Treating RN: Baruch Gouty, RN, BSN, Velva Harman Primary Care Physician: Juanell Fairly Other Clinician: Referring Physician: Juanell Fairly Treating Physician/Extender:  Frann Rider in Treatment: 4 Vital Signs Height(in): 74 Pulse(bpm): 83 Weight(lbs): 242 Blood Pressure 142/78 (mmHg): Body Mass Index(BMI): 31 Temperature(F): 98.9 Respiratory Rate 19 (breaths/min): Photos: [1:No Photos] [N/A:N/A] Wound Location: [1:Right Toe Great] [N/A:N/A] Wounding Event: [1:Gradually Appeared] [N/A:N/A] Primary Etiology: [1:Diabetic Wound/Ulcer of the Lower Extremity] [N/A:N/A] Comorbid History: [1:Anemia, Asthma, Hypertension, Type II Diabetes, Neuropathy] [N/A:N/A] Date Acquired: [1:07/02/2015] [N/A:N/A] Weeks of Treatment: [1:4] [N/A:N/A] Wound Status: [1:Open] [N/A:N/A] Measurements L x W x D 0.6x0.6x0.1 [N/A:N/A] (cm) Area (cm) : [1:0.283] [N/A:N/A] Volume (cm) : [1:0.028] [N/A:N/A] % Reduction in Area: [1:48.50%] [N/A:N/A] % Reduction in Volume: 49.10% [N/A:N/A] Classification: [1:Grade 1] [N/A:N/A] Exudate Amount: [  1:None Present] [N/A:N/A] Wound Margin: [1:Distinct, outline attached] [N/A:N/A] Granulation Amount: [1:Medium (34-66%)] [N/A:N/A] Granulation Quality: [1:Pink, Pale] [N/A:N/A] Necrotic Amount: [1:Small (1-33%)] [N/A:N/A] Necrotic Tissue: [1:Eschar] [N/A:N/A] Exposed Structures: [1:Fascia: No Fat: No Tendon: No Muscle: No Joint: No Bone: No] [N/A:N/A] Limited to Skin Breakdown Epithelialization: None N/A N/A Periwound Skin Texture: Edema: Yes N/A N/A Callus: Yes Excoriation: No Induration: No Crepitus: No Fluctuance: No Friable: No Rash: No Scarring: No Periwound Skin Dry/Scaly: Yes N/A N/A Moisture: Maceration: No Moist: No Periwound Skin Color: Atrophie Blanche: No N/A N/A Cyanosis: No Ecchymosis: No Erythema: No Hemosiderin Staining: No Mottled: No Pallor: No Rubor: No Temperature: No Abnormality N/A N/A Tenderness on No N/A N/A Palpation: Wound Preparation: Ulcer Cleansing: N/A N/A Rinsed/Irrigated with Saline Topical Anesthetic Applied: Other: lidocaine 4% Treatment Notes Electronic  Signature(s) Signed: 01/11/2016 2:53:54 PM By: Regan Lemming BSN, RN Entered By: Regan Lemming on 01/11/2016 11:14:25 Willie Krueger ([id number]) -------------------------------------------------------------------------------- Monument Details Patient Name: JASIRE, SANDFORD. Date of Service: 01/11/2016 10:45 AM Medical Record Number: [id number] Patient Account Number: [id number] Date of Birth/Sex: 15-Jan-1956 (60 y.o. Male) Treating RN: Baruch Gouty, RN, BSN, Velva Harman Primary Care Physician: Juanell Fairly Other Clinician: Referring Physician: Juanell Fairly Treating Physician/Extender: Frann Rider in Treatment: 4 Active Inactive Electronic Signature(s) Signed: 02/06/2016 12:52:55 PM By: Regan Lemming BSN, RN Previous Signature: 02/06/2016 12:00:09 PM Version By: Sharon Mt Previous Signature: 01/11/2016 2:53:54 PM Version By: Regan Lemming BSN, RN Entered By: Regan Lemming on 02/06/2016 12:52:55 HERBERTH, MANUELE ([id number]) -------------------------------------------------------------------------------- Pain Assessment Details Patient Name: JOB, SAVARD. Date of Service: 01/11/2016 10:45 AM Medical Record Number: [id number] Patient Account Number: [id number] Date of Birth/Sex: March 22, 1956 (60 y.o. Male) Treating RN: Baruch Gouty, RN, BSN, Velva Harman Primary Care Physician: Juanell Fairly Other Clinician: Referring Physician: Juanell Fairly Treating Physician/Extender: Frann Rider in Treatment: 4 Active Problems Location of Pain Severity and Description of Pain Patient Has Paino No Site Locations Rate the pain. Current Pain Level: 0 Pain Management and Medication Current Pain Management: Electronic Signature(s) Signed: 01/11/2016 2:53:54 PM By: Regan Lemming BSN, RN Entered By: Regan Lemming on 01/11/2016 11:05:08 [name] ([id number]) -------------------------------------------------------------------------------- Patient/Caregiver Education Details Patient Name: JACOREY, SPACE. Date  of Service: 01/11/2016 10:45 AM Medical Record Number: [id number] Patient Account Number: [id number] Date of Birth/Gender: 1956-07-02 (60 y.o. Male) Treating RN: Baruch Gouty, RN, BSN, Velva Harman Primary Care Physician: Juanell Fairly Other Clinician: Referring Physician: Juanell Fairly Treating Physician/Extender: Frann Rider in Treatment: 4 Education Assessment Education Provided To: Patient Education Topics Provided Infection: Methods: Explain/Verbal Responses: State content correctly Smoking and Wound Healing: Methods: Explain/Verbal Responses: State content correctly Welcome To [facility] [place]: Methods: Explain/Verbal Responses: State content correctly Wound/Skin Impairment: Methods: Explain/Verbal Responses: State content correctly Electronic Signature(s) Signed: 01/11/2016 2:53:54 PM By: Regan Lemming BSN, RN Entered By: Regan Lemming on 01/11/2016 11:36:07 [name] ([id number]) -------------------------------------------------------------------------------- Wound Assessment Details Patient Name: JAYLIND, ELZINGA. Date of Service: 01/11/2016 10:45 AM Medical Record Number: [id number] Patient Account Number: [id number] Date of Birth/Sex: 09-27-1956 (60 y.o. Male) Treating RN: Baruch Gouty, RN, BSN, Velva Harman Primary Care Physician: Juanell Fairly Other Clinician: Referring Physician: Juanell Fairly Treating Physician/Extender: Frann Rider in Treatment: 4 Wound Status Wound Number: 1 Primary Diabetic Wound/Ulcer of the Lower Etiology: Extremity Wound Location: Right Toe Great Wound Open Wounding Event: Gradually Appeared Status: Date Acquired: 07/02/2015 Comorbid Anemia, Asthma, Hypertension, Type Weeks Of Treatment: 4 History: II Diabetes, Neuropathy Clustered Wound: No Photos Photo Uploaded By: Regan Lemming on 01/11/2016 14:27:27 Wound Measurements  Length: (cm) 0.6 Width: (cm) 0.6 Depth: (cm) 0.1 Area: (cm) 0.283 Volume: (cm) 0.028 % Reduction in Area: 48.5% %  Reduction in Volume: 49.1% Epithelialization: None Tunneling: No Undermining: No Wound Description Classification: Grade 1 Wound Margin: Distinct, outline attached Exudate Amount: None Present Foul Odor After Cleansing: No Wound Bed BRIDGES, LEISING. ([id number]) Granulation Amount: Medium (34-66%) Exposed Structure Granulation Quality: Pink, Pale Fascia Exposed: No Necrotic Amount: Small (1-33%) Fat Layer Exposed: No Necrotic Quality: Eschar Tendon Exposed: No Muscle Exposed: No Joint Exposed: No Bone Exposed: No Limited to Skin Breakdown Periwound Skin Texture Texture Color No Abnormalities Noted: No No Abnormalities Noted: No Callus: Yes Atrophie Blanche: No Crepitus: No Cyanosis: No Excoriation: No Ecchymosis: No Fluctuance: No Erythema: No Friable: No Hemosiderin Staining: No Induration: No Mottled: No Localized Edema: Yes Pallor: No Rash: No Rubor: No Scarring: No Temperature / Pain Moisture Temperature: No Abnormality No Abnormalities Noted: No Dry / Scaly: Yes Maceration: No Moist: No Wound Preparation Ulcer Cleansing: Rinsed/Irrigated with Saline Topical Anesthetic Applied: Other: lidocaine 4%, Electronic Signature(s) Signed: 01/11/2016 2:53:54 PM By: Regan Lemming BSN, RN Entered By: Regan Lemming on 01/11/2016 11:09:06 TANOR, MARTINA ([id number]) -------------------------------------------------------------------------------- Vitals Details Patient Name: KAREY, [place]. Date of Service: 01/11/2016 10:45 AM Medical Record Number: [id number] Patient Account Number: [id number] Date of Birth/Sex: [birth year] (60 y.o. Male) Treating RN: Baruch Gouty, RN, BSN, [place] Primary Care Physician: Juanell Fairly Other Clinician: Referring Physician: Juanell Fairly Treating Physician/Extender: Frann Rider in Treatment: 4 Vital Signs Time Taken: 11:08 Temperature (F): 98.9 Height (in): 74 Pulse (bpm): 83 Weight (lbs): 242 Respiratory Rate (breaths/min):  19 Body Mass Index (BMI): 31.1 Blood Pressure (mmHg): 142/78 Reference Range: 80 - 120 mg / dl Electronic Signature(s) Signed: 01/11/2016 2:53:54 PM By: Regan Lemming BSN, RN Entered By: Regan Lemming on 01/11/2016 11:09:30

## 2016-01-12 NOTE — Progress Notes (Signed)
Willie, Krueger ([id number]) Visit Report for 01/11/2016 Chief Complaint Document Details Patient Name: Willie Krueger, Willie Krueger. Date of Service: 01/11/2016 10:45 AM Medical Record Number: [id number] Patient Account Number: [id number] Date of Birth/Sex: 1956/05/13 (60 y.o. Male) Treating RN: Baruch Gouty, RN, BSN, Velva Harman Primary Care Physician: Juanell Fairly Other Clinician: Referring Physician: Juanell Fairly Treating Physician/Extender: Frann Rider in Treatment: 4 Information Obtained from: Patient Chief Complaint Patients presents for treatment of an open diabetic ulcer to his right big toe which she's had for several months Electronic Signature(s) Signed: 01/11/2016 11:36:56 AM By: Christin Fudge MD, FACS Entered By: Christin Fudge on 01/11/2016 11:36:56 BABAJIDE, RUFENACHT ([id number]) -------------------------------------------------------------------------------- Debridement Details Patient Name: Willie, Krueger. Date of Service: 01/11/2016 10:45 AM Medical Record Number: [id number] Patient Account Number: [id number] Date of Birth/Sex: September 24, 1956 (60 y.o. Male) Treating RN: Baruch Gouty, RN, BSN, [place] Primary Care Physician: Juanell Fairly Other Clinician: Referring Physician: Juanell Fairly Treating Physician/Extender: Frann Rider in Treatment: 4 Debridement Performed for Wound #1 Right Toe Great Assessment: Performed By: Physician Christin Fudge, MD Debridement: Debridement Pre-procedure Yes Verification/Time Out Taken: Start Time: 11:18 Pain Control: Lidocaine 4% Topical Solution Level: Skin/Subcutaneous Tissue Total Area Debrided (L x 0.6 (cm) x 0.6 (cm) = 0.36 (cm) W): Tissue and other Non-Viable, Callus, Fibrin/Slough, Subcutaneous material debrided: Instrument: Curette, Forceps Bleeding: Minimum Hemostasis Achieved: Pressure End Time: 11:29 Procedural Pain: 0 Post Procedural Pain: 0 Response to Treatment: Procedure was tolerated well Post Debridement Measurements of Total  Wound Length: (cm) 0.7 Width: (cm) 0.7 Depth: (cm) 0.2 Volume: (cm) 0.077 Post Procedure Diagnosis Same as Pre-procedure Electronic Signature(s) Signed: 01/11/2016 11:36:48 AM By: Christin Fudge MD, FACS Signed: 01/11/2016 2:53:54 PM By: Regan Lemming BSN, RN Entered By: Christin Fudge on 01/11/2016 11:36:48 CORMICK, SHEFFIELD ([id number]) -------------------------------------------------------------------------------- HPI Details Patient Name: Willie, Krueger. Date of Service: 01/11/2016 10:45 AM Medical Record Number: [id number] Patient Account Number: [id number] Date of Birth/Sex: 04-13-1956 (60 y.o. Male) Treating RN: Baruch Gouty, RN, BSN, Velva Harman Primary Care Physician: Juanell Fairly Other Clinician: Referring Physician: Juanell Fairly Treating Physician/Extender: Frann Rider in Treatment: 4 History of Present Illness Location: ulcerated area on his right big toe Quality: Patient reports No Pain. Severity: Patient states wound are getting worse. Duration: Patient has had the wound for > 3 months prior to seeking treatment at the wound center Timing: Pain in wound is Intermittent (comes and goes Context: The wound appeared gradually over time Modifying Factors: Consults to this date include: infectious disease who has put him on IV antibiotics with a PICC line Associated Signs and Symptoms: Patient reports having difficulty standing for long periods. HPI Description: 60 year old gentleman with multiple medical problems with a past medical history of diabetes mellitus, hypertension, neuropathy, chronic pain and nicotine abuse. he was recently seen by Dr. Vincenza Hews of infectious disease at [facility] for a osteomyelitis of the distal phalanx of his right great toe. He also had diffuse cellulitis of the right lower leg. He has been advised to have a PICC line and will receive IV vancomycin and oral ciprofloxacin plus oral metronidazole. he had an MRI which was showing edema of the distal  phalanx with probably represent osteomyelitis. He smokes a pack and a half of cigarettes a day. his last hemoglobin A1c was 8% A venous duplex of the right lower extremity was done recently on 11/30/2015 and this showed no evidence of DVT, superficial thrombophlebitis or incompetence. 01/11/2016 -- the patient only comes here once every 2 weeks and says he has several appointments  and other issues which prevented him from coming for wound care. He has declined hyperbaric oxygen therapy, declined to stop smoking and has declined to see a surgeon regarding options for his toe. Electronic Signature(s) Signed: 01/11/2016 11:38:11 AM By: Christin Fudge MD, FACS Entered By: Christin Fudge on 01/11/2016 11:38:10 OCTAVIO, MILTON ([id number]) -------------------------------------------------------------------------------- Physical Exam Details Patient Name: Willie, Krueger. Date of Service: 01/11/2016 10:45 AM Medical Record Number: [id number] Patient Account Number: [id number] Date of Birth/Sex: [birth year] (60 y.o. Male) Treating RN: Baruch Gouty, RN, BSN, Velva Harman Primary Care Physician: Juanell Fairly Other Clinician: Referring Physician: Juanell Fairly Treating Physician/Extender: Frann Rider in Treatment: 4 Constitutional . Pulse regular. Respirations normal and unlabored. Afebrile. . Eyes Nonicteric. Reactive to light. Ears, Nose, Mouth, and Throat Lips, teeth, and gums WNL.Marland Kitchen Moist mucosa without lesions. Neck supple and nontender. No palpable supraclavicular or cervical adenopathy. Normal sized without goiter. Respiratory WNL. No retractions.. Cardiovascular Pedal Pulses WNL. No clubbing, cyanosis or edema. Lymphatic No adneopathy. No adenopathy. No adenopathy. Musculoskeletal Adexa without tenderness or enlargement.. Digits and nails w/o clubbing, cyanosis, infection, petechiae, ischemia, or inflammatory conditions.. Integumentary (Hair, Skin) No suspicious lesions. No crepitus or  fluctuance. No peri-wound warmth or erythema. No masses.Marland Kitchen Psychiatric Judgement and insight Intact.. No evidence of depression, anxiety, or agitation.. Notes the patient has a lot of callus buildup and I have sharply debrided the entire callus down to the subcutaneous tissue and once his subcutaneous tissue was opened out it is probing down to bone. Electronic Signature(s) Signed: 01/11/2016 11:39:09 AM By: Christin Fudge MD, FACS Entered By: Christin Fudge on 01/11/2016 11:39:09 KALDEN, DORIN ([id number]) -------------------------------------------------------------------------------- Physician Orders Details Patient Name: NAASIR, NEW[place]. Date of Service: 01/11/2016 10:45 AM Medical Record Number: [id number] Patient Account Number: [id number] Date of Birth/Sex: [birth year] (60 y.o. Male) Treating RN: Baruch Gouty, RN, BSN, Velva Harman Primary Care Physician: Juanell Fairly Other Clinician: Referring Physician: Juanell Fairly Treating Physician/Extender: Frann Rider in Treatment: 4 Verbal / Phone Orders: Yes Clinician: Afful, RN, BSN, Rita Read Back and Verified: Yes Diagnosis Coding Wound Cleansing Wound #1 Right Toe Great o Cleanse wound with mild soap and water o May Shower, gently pat wound dry prior to applying new dressing. o May shower with protection. Anesthetic Wound #1 Right Toe Great o Topical Lidocaine 4% cream applied to wound bed prior to debridement Primary Wound Dressing Wound #1 Right Toe Great o Aquacel Ag Secondary Dressing Wound #1 Right Toe Great o Gauze and Kerlix/Conform Dressing Change Frequency Wound #1 Right Toe Great o Change dressing every other day. Follow-up Appointments Wound #1 Right Toe Great o Return Appointment in 1 week. Off-Loading Wound #1 Right Toe Great o Open toe surgical shoe with peg assist. o Other: - felt Electronic Signature(s) Signed: 01/11/2016 2:53:54 PM By: Regan Lemming BSN, RN Signed: 01/11/2016 3:13:05 PM By:  Christin Fudge MD, FACS DOCK, SHAMBURG ([id number]) Entered By: Regan Lemming on 01/11/2016 11:22:45 TERELL, STITELER ([id number]) -------------------------------------------------------------------------------- Problem List Details Patient Name: JACQUES, POPKE. Date of Service: 01/11/2016 10:45 AM Medical Record Number: [id number] Patient Account Number: [id number] Date of Birth/Sex: [birth year] (60 y.o. 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 Date of Service: 01/11/2016 10:45 AM Medical Record Number: [id number] Patient Account Number: [id number] Date of Birth/Sex: April 19, 1956 (60 y.o. Male) Treating RN: Baruch Gouty, RN, BSN, Velva Harman Primary Care Physician: Juanell Fairly Other Clinician: Referring Physician: Juanell Fairly Treating Physician/Extender: Frann Rider in Treatment: 4 Subjective Chief Complaint Information obtained from Patient Patients presents for treatment of an open diabetic ulcer to his right big toe which she's had for several months History of  Present Illness (HPI) The following HPI elements were documented for the patient's wound: Location: ulcerated area on his right big toe Quality: Patient reports No Pain. Severity: Patient states wound are getting worse. Duration: Patient has had the wound for > 3 months prior to seeking treatment at the wound center Timing: Pain in wound is Intermittent (comes and goes Context: The wound appeared gradually over time Modifying Factors: Consults to this date include: infectious disease who has put him on IV antibiotics with a PICC line Associated Signs and Symptoms: Patient reports having difficulty standing for long periods. 60 year old gentleman with multiple medical problems with a past medical history of diabetes mellitus, hypertension, neuropathy, chronic pain and nicotine abuse. he was recently seen by Dr. Vincenza Hews of infectious disease at [facility] for a osteomyelitis of the distal phalanx of his right great toe. He also had diffuse cellulitis of the right lower leg. He has been advised to have a PICC line and will receive IV vancomycin and oral ciprofloxacin plus oral metronidazole. he had an MRI which was showing edema of the distal phalanx with probably represent osteomyelitis. He smokes a pack and a half of cigarettes a day. his last hemoglobin A1c was 8% A venous duplex of the right lower extremity was done recently on 11/30/2015 and this showed no evidence of DVT, superficial thrombophlebitis or incompetence. 01/11/2016 -- the patient only comes here once every 2 weeks and says he has several appointments and other issues which prevented him from coming for wound care. He has declined hyperbaric oxygen therapy, declined to stop smoking and has declined to see a surgeon regarding options for his toe. [address] ARISTOTELIS, GIOVANELLI ([id number]) Constitutional Pulse regular. Respirations normal and unlabored. Afebrile. Vitals Time Taken: 11:08 AM, Height: 74 in, Weight: 242 lbs,  BMI: 31.1, Temperature: 98.9 F, Pulse: 83 bpm, Respiratory Rate: 19 breaths/min, Blood Pressure: 142/78 mmHg. Eyes Nonicteric. Reactive to light. Ears, Nose, Mouth, and Throat Lips, teeth, and gums WNL.Marland Kitchen Moist mucosa without lesions. Neck supple and nontender. No palpable supraclavicular or cervical adenopathy. Normal sized without goiter. Respiratory WNL. No retractions.. Cardiovascular Pedal Pulses WNL. No clubbing, cyanosis or edema. Lymphatic No adneopathy. No adenopathy. No adenopathy. Musculoskeletal Adexa without tenderness or enlargement.. Digits and nails w/o clubbing, cyanosis, infection, petechiae, ischemia, or inflammatory conditions.Marland Kitchen Psychiatric Judgement and insight Intact.. No evidence of depression, anxiety, or agitation.. General Notes: the patient has a lot of callus buildup and I have sharply debrided the entire callus down to the subcutaneous tissue and once his subcutaneous tissue was opened out it is probing down to bone. Integumentary (Hair, Skin) No suspicious lesions. No crepitus or fluctuance. No peri-wound warmth or erythema. No masses.. Wound #1 status is Open. Original cause of  wound was Gradually Appeared. The wound is located on the Right Toe Great. The wound measures 0.6cm length x 0.6cm width x 0.1cm depth; 0.283cm^2 area and 0.028cm^3 volume. The wound is limited to skin breakdown. There is no tunneling or undermining noted. There is a none present amount of drainage noted. The wound margin is distinct with the outline attached to the wound base. There is medium (34-66%) pink, pale granulation within the wound bed. There is a small (1-33%) amount of necrotic tissue within the wound bed including Eschar. The periwound skin appearance exhibited: Callus, Localized Edema, Dry/Scaly. The periwound skin appearance did not exhibit: Crepitus, Excoriation, Fluctuance, Friable, Induration, Rash, Scarring, Maceration, Moist, Atrophie Blanche, Cyanosis,  Ecchymosis, Hemosiderin Staining, Mottled, Pallor, Rubor, Erythema. Periwound temperature was noted as No Abnormality. 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 He also has a nicotine addiction and has several other comorbidities. I have recommended: 1. Aquacel Ag over the open ulcerated area and a bordered foam. 2. Offloading shoes which she already has but has not been using regularly and I have stressed the importance of this 3. Continue with his IV antibiotics and care as per Dr. Vincenza Hews of infectious disease 4. Patient has several excuses but says he will try to be compliant about giving up smoking 5. he declines to be treated with Hyperbaric oxygen therapy with the protocol for osteomyelitis to be given over 8 weeks with 40 sittings. 6. he declines a surgical consultation until he sees his ID specialist the patient has had several questions all answered to his satisfaction but seems rather headstrong and noncompliant and though polite, will not take my recommendations. Procedures Wound #1 Wound #1 is a Diabetic Wound/Ulcer of the Lower Extremity located on the Right Toe Great . There was a Skin/Subcutaneous Tissue Debridement [id number]) debridement with total area of 0.36 sq cm performed by Christin Fudge, MD. with the following instrument(s): Curette and Forceps to remove Non-Viable tissue/material including Fibrin/Slough, Callus, and Subcutaneous after achieving pain control using Lidocaine 4% Topical Solution. A time out was  conducted prior to the start of the procedure. A Minimum amount of bleeding was controlled with Pressure. The procedure was tolerated well with a pain level of 0 throughout and a pain level of 0 following the procedure. Post Debridement Measurements: 0.7cm length x 0.7cm width x 0.2cm depth; 0.077cm^3 volume. Post procedure Diagnosis Wound #1: Same as Pre-Procedure BLADYN, ELKIND ([id number]) Plan Wound Cleansing: Wound #1 Right Toe Great: Cleanse wound with mild soap and water May Shower, gently pat wound dry prior to applying new dressing. May shower with protection. Anesthetic: Wound #1 Right Toe Great: Topical Lidocaine 4% cream applied to wound bed prior to debridement Primary Wound Dressing: Wound #1 Right Toe Great: Aquacel Ag Secondary Dressing: Wound #1 Right Toe Great: Gauze and Kerlix/Conform Dressing Change Frequency: Wound #1 Right Toe Great: Change dressing every other day. Follow-up Appointments: Wound #1 Right Toe Great: Return Appointment in 1 week. Off-Loading: Wound #1 Right Toe Great: Open toe surgical shoe with peg assist. 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 He also has a nicotine addiction and has several other comorbidities. I have recommended: 1. Aquacel Ag over the open ulcerated area and a bordered foam. 2. Offloading shoes which she already has but has not been using regularly and I have stressed the importance of this 3. Continue with his IV antibiotics and care as per Dr. Vincenza Hews of infectious disease 4. Patient has several excuses but says he will try to be compliant about giving up smoking 5. he declines to be treated with Hyperbaric oxygen therapy with the protocol for osteomyelitis to be given over 8 weeks with 40 sittings. 6. he declines a surgical consultation until he sees his ID specialist TAYSOM, ZENTNER ([id number]) the patient has  had several questions all answered to his satisfaction but seems rather headstrong and noncompliant and though polite, will not take my recommendations. Electronic Signature(s) Signed: 01/11/2016 11:41:13 AM By: Christin Fudge MD, FACS Entered By: Christin Fudge on 01/11/2016 11:41:13 TYLLER, SARTINI ([id number]) -------------------------------------------------------------------------------- SuperBill Details Patient Name: IZAHA, OROSCO. Date of Service: 01/11/2016 Medical Record Number: [id number] Patient Account Number: [id number] Date of Birth/Sex: 1956/01/19 (60 y.o. Male) Treating RN: Baruch Gouty, RN, BSN, [place] Primary Care Physician: Juanell Fairly Other Clinician: Referring Physician: Juanell Fairly Treating Physician/Extender: Frann Rider in Treatment: 4 Diagnosis Coding ICD-10 Codes Code Description [phone] Type 2 diabetes mellitus with foot ulcer L97.514 Non-pressure chronic ulcer of other part of right foot with necrosis of bone M86.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, right ankle and foot F17.218 Nicotine dependence, cigarettes, with other nicotine-induced disorders L84 Corns and callosities Facility Procedures CPT4 Code Description: [id number] - DEB SUBQ TISSUE 20 SQ CM/< ICD-10 Description Diagnosis E11.621 Type 2 diabetes mellitus with foot ulcer L97.514 Non-pressure chronic ulcer of other part of right fo M86.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, right  ankle and fo F17.218 Nicotine dependence, cigarettes, with other nicotine Modifier: ot with necros ot -induced disor Quantity: 1 is of bone ders Physician Procedures CPT4 Code Description: [id number] - WC PHYS SUBQ TISS 20 SQ CM ICD-10 Description Diagnosis E11.621 Type 2 diabetes mellitus with foot ulcer L97.514 Non-pressure chronic ulcer of other part of right fo M86.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, right  ankle and fo F17.218 Nicotine dependence, cigarettes, with other nicotine Modifier: ot with necros ot  -induced disor Quantity: 1 is of bone ders Electronic Signature(s) Signed: 01/11/2016 11:41:24 AM By: Christin Fudge MD, FACS Entered By: Christin Fudge on 01/11/2016 11:41:24

## 2016-01-23 ENCOUNTER — Encounter: Payer: Self-pay | Admitting: Internal Medicine

## 2016-01-23 ENCOUNTER — Ambulatory Visit (INDEPENDENT_AMBULATORY_CARE_PROVIDER_SITE_OTHER): Payer: 59 | Admitting: Internal Medicine

## 2016-01-23 ENCOUNTER — Telehealth: Payer: Self-pay | Admitting: *Deleted

## 2016-01-23 VITALS — BP 137/76 | HR 94 | Temp 98.6°F | Ht 74.0 in | Wt 244.0 lb

## 2016-01-23 DIAGNOSIS — E1169 Type 2 diabetes mellitus with other specified complication: Secondary | ICD-10-CM

## 2016-01-23 DIAGNOSIS — M869 Osteomyelitis, unspecified: Secondary | ICD-10-CM | POA: Diagnosis not present

## 2016-01-23 DIAGNOSIS — E11621 Type 2 diabetes mellitus with foot ulcer: Secondary | ICD-10-CM | POA: Insufficient documentation

## 2016-01-23 NOTE — Assessment & Plan Note (Signed)
I'm hopeful that his right great toe osteomyelitis has now been cured. He will stop his antibiotics and I will have the PICC removed. He will follow-up here in 6 weeks.

## 2016-01-23 NOTE — Progress Notes (Signed)
[facility]  Patient Active Problem List   Diagnosis Date Noted  . Diabetic osteomyelitis ([place]) 12/05/2015    Priority: High  . Obesity 12/05/2015    Priority: Medium  . Chronic pain 10/31/2015    Priority: Medium  . HLD (hyperlipidemia) 10/20/2012    Priority: Medium  . Type 2 diabetes mellitus ([place]) 09/08/2012    Priority: Medium  . Current smoker 10/02/2011    Priority: Medium  . Neuropathy ([place]) 12/05/2015  . Encounter for chronic pain management 12/03/2015  . Elevated C-reactive protein (CRP) 12/03/2015  . Long term current use of opiate analgesic 10/31/2015  . Long term prescription opiate use 10/31/2015  . Opiate use 10/31/2015  . Opiate dependence ([place]) 10/31/2015  . Encounter for therapeutic drug level monitoring 10/31/2015  . Chronic neck pain (Right side) 10/31/2015  . Cervical spondylosis 10/31/2015  . Cervical post-laminectomy syndrome 10/31/2015  . Chronic low back pain (Midline pain) (R>L) (Primary Pain) 10/31/2015  . Bilateral lower extremity pain 10/31/2015  . Chronic cervical radicular pain (Bilateral) (R>L) 10/31/2015  . Chronic lumbar radicular pain (Bilateral) (R>L) 10/31/2015  . Substance use disorder Risk: LOW 10/31/2015  . 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 Epidural fibrosis 10/31/2015  . Chronic bilateral lower extremity pain 10/31/2015  . History of carpal tunnel release of both wrists 10/31/2015  . Grade I anterolisthesis of C7 on T1 10/31/2015  . History of spinal stenosis (Before surgery) 10/31/2015    Class: History of  . Toe ulcer ([place]) 06/24/2015  . Chronic pain associated with significant psychosocial dysfunction 05/04/2015  . Cervical spinal cord compression ([place]) 02/07/2015  . Nerve root pain 12/07/2014  . MI (mitral incompetence) 11/11/2014  . Cardiac murmur 11/09/2014  . Degenerative arthritis of hip 07/26/2014  . Leg weakness 07/07/2014  . Nerve root inflammation 06/26/2014  .  History of decompression of median nerve 05/19/2014  . Benign hypertension 10/20/2012  . 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 Chronic hepatitis C virus infection ([place]) 10/02/2011  . Adiposity 10/02/2011    Patient's Medications  New Prescriptions   No medications on file  Previous Medications   ACETAMINOPHEN (TYLENOL) 500 MG TABLET    Take 500 mg by mouth every 4 (four) hours. Scheduled (5 times daily)   ASPIRIN EC 81 MG TABLET    Take 81 mg by mouth daily.   CHOLECALCIFEROL (VITAMIN D3) 2000 UNITS TABS    Take 2,000 Units by mouth daily.    DIPHENHYDRAMINE-ACETAMINOPHEN (TYLENOL PM) 25-500 MG TABS TABLET    Take 2 tablets by mouth at bedtime.   FUROSEMIDE (LASIX) 20 MG TABLET    Take 40 mg by mouth 2 (two) times daily.    GABAPENTIN (NEURONTIN) 600 MG TABLET    Take 1,200 mg by mouth every 4 (four) hours. Scheduled (5 times daily)   GLIMEPIRIDE (AMARYL) 2 MG TABLET    Take 2 mg by mouth daily with breakfast.    LISINOPRIL (PRINIVIL,ZESTRIL) 20 MG TABLET    Take 20 mg by mouth at bedtime.    METFORMIN (GLUCOPHAGE-XR) 500 MG 24 HR TABLET    Take 500 mg by mouth 2 (two) times daily.   OXYCODONE (ROXICODONE) 15 MG IMMEDIATE RELEASE TABLET    Take 1 tablet (15 mg total) by mouth 5 (five) times daily as needed for pain.   PRAVASTATIN (PRAVACHOL) 20 MG TABLET    Take 20 mg by mouth at bedtime.  SILDENAFIL (VIAGRA) 100 MG TABLET    Take 100 mg by mouth daily as needed for erectile dysfunction.  Modified Medications   No medications on file  Discontinued Medications   CIPROFLOXACIN (CIPRO) 500 MG TABLET    Take 1 tablet (500 mg total) by mouth 2 (two) times daily.   METRONIDAZOLE (FLAGYL) 500 MG TABLET    Take 1 tablet (500 mg total) by mouth 3 (three) times daily.   OXYCODONE (ROXICODONE) 15 MG IMMEDIATE RELEASE TABLET    Take 1 tablet (15 mg total) by mouth 5 (five) times daily as needed for pain.   VANCOMYCIN 1,000 MG IN SODIUM CHLORIDE 0.9 % 250 ML    Inject 1,000 mg  into the vein daily.    Subjective: [name] is in for his routine follow-up visit. He is now completed 45 days of empiric antibiotic therapy with IV vancomycin, oral ciprofloxacin and oral metronidazole for his right great toe osteomyelitis. He feels like he is doing much better. He states the swelling in his foot and leg has gone down to half the size it was before starting his current antibiotics.  Review of Systems: Review of Systems  Constitutional: Positive for malaise/fatigue. Negative for fever, chills, weight loss and diaphoresis.  Respiratory: Negative for cough and sputum production.   Cardiovascular: Negative for chest pain.  Gastrointestinal: Negative for nausea, vomiting, abdominal pain, diarrhea and constipation.  Musculoskeletal: Positive for joint pain.       His right great toe pain is well-controlled with his chronic pain regimen of Tylenol, oxycodone and gabapentin.  Skin: Negative for rash.  Psychiatric/Behavioral: Negative for depression.    Past Medical History  Diagnosis Date  . Diabetes mellitus without complication ([name])   . Hypertension   . Chronic pain   . Hyperlipidemia   . Osteoarthritis   . Heart murmur   . Mitral valve regurgitation   . History of spinal stenosis 10/31/2015    C3-4 with severe central canal stenosis with small amount of myelomalacia present. Moderate to severe canal stenosis is also present at C4-5 and C5-6. Moderate to severe neuroforaminal narrowing between the levels of C2-C7    Social History  Substance Use Topics  . Smoking status: Current Every Day Smoker -- 1.50 packs/day    Types: Cigarettes  . Smokeless tobacco: None  . Alcohol Use: No    Family History  Problem Relation Age of Onset  . Cancer Mother   . Diabetes Mother   . Heart disease Father     Allergies  Allergen Reactions  . Cleocin [Clindamycin] Rash  . Sulfa Antibiotics Rash    Objective: Filed Vitals:   01/23/16 1414  BP: 137/76  Pulse: 94    Temp: 98.6 F (37 C)  Height: 6\' 2"  (1.88 m)  Weight: 244 lb (110.678 kg)   Body mass index is 31.31 kg/(m^2).  Physical Exam  Constitutional: He is oriented to person, place, and time.  He is in good spirits.  HENT:  Mouth/Throat: No oropharyngeal exudate.  Eyes: Conjunctivae are normal.  Cardiovascular: Normal rate and regular rhythm.   No murmur heard. Pulmonary/Chest: Breath sounds normal.  Abdominal: Soft. He exhibits no mass. There is no tenderness.  Musculoskeletal: Normal range of motion.  He continues to have pitting edema of his right lower leg and foot but it has improved significantly. The erythema has improved. He now has a dry a superficial callus over the right great toe tip.  Neurological: He is alert and oriented to  person, place, and time.  Skin: No rash noted.  Right arm PICC site looks good.  Psychiatric: Mood and affect normal.    Lab Results SED RATE (mm/hr)  Date Value  11/16/2015 6   CRP (mg/dL)  Date Value  11/16/2015 1.8*     Problem List Items Addressed This Visit      High   Diabetic osteomyelitis ([place]) - Primary    I'm hopeful that his right great toe osteomyelitis has now been cured. He will stop his antibiotics and I will have the PICC removed. He will follow-up here in 6 weeks.          Michel Bickers, MD [facility] [place] [facility] [phone] pager   [phone] cell 01/23/2016, 2:40 PM

## 2016-01-23 NOTE — Telephone Encounter (Signed)
Per Dr. Megan Salon, verbal order to pull PICC at end of therapy 1/26 relayed to [facility], pharmacist [facility]. Landis [name], RN

## 2016-01-25 ENCOUNTER — Ambulatory Visit: Payer: 59 | Admitting: Surgery

## 2016-01-29 ENCOUNTER — Encounter: Payer: Self-pay | Admitting: Internal Medicine

## 2016-02-11 ENCOUNTER — Ambulatory Visit: Payer: 59 | Admitting: Surgery

## 2016-02-18 ENCOUNTER — Encounter: Payer: 59 | Attending: Surgery | Admitting: Surgery

## 2016-02-18 DIAGNOSIS — M86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, right ankle and foot: Secondary | ICD-10-CM | POA: Insufficient documentation

## 2016-02-18 DIAGNOSIS — L84 Corns and callosities: Secondary | ICD-10-CM | POA: Insufficient documentation

## 2016-02-18 DIAGNOSIS — E1140 Type 2 diabetes mellitus with diabetic neuropathy, unspecified: Secondary | ICD-10-CM | POA: Diagnosis not present

## 2016-02-18 DIAGNOSIS — E11621 Type 2 diabetes mellitus with foot ulcer: Secondary | ICD-10-CM | POA: Diagnosis not present

## 2016-02-18 DIAGNOSIS — F17218 Nicotine dependence, cigarettes, with other nicotine-induced disorders: Secondary | ICD-10-CM | POA: Insufficient documentation

## 2016-02-18 DIAGNOSIS — L97514 Non-pressure chronic ulcer of other part of right foot with necrosis of bone: Secondary | ICD-10-CM | POA: Diagnosis not present

## 2016-02-18 DIAGNOSIS — I10 Essential (primary) hypertension: Secondary | ICD-10-CM | POA: Insufficient documentation

## 2016-02-18 NOTE — Progress Notes (Addendum)
Willie Krueger ([id number]) Visit Report for 02/18/2016 Chief Complaint Document Details Patient Name: Willie Krueger, Willie Krueger. Date of Service: 02/18/2016 12:45 PM Medical Record Number: [id number] Patient Account Number: [id number] Date of Birth/Sex: 1956/01/06 (60 y.o. Male) Treating RN: Macarthur Critchley Primary Care Physician: Juanell Fairly Other Clinician: Referring Physician: Juanell Fairly Treating Physician/Extender: Frann Rider in Treatment: 9 Information Obtained from: Patient Chief Complaint Patients presents for treatment of an open diabetic ulcer to his right big toe which she's had for several months Electronic Signature(s) Signed: 02/18/2016 1:25:26 PM By: Christin Fudge MD, FACS Entered By: Christin Fudge on 02/18/2016 13:25:26 KEREK, VARGHESE ([id number]) -------------------------------------------------------------------------------- Debridement Details Patient Name: Willie Krueger. Date of Service: 02/18/2016 12:45 PM Medical Record Number: [id number] Patient Account Number: [id number] Date of Birth/Sex: May 19, 1956 (60 y.o. Male) Treating RN: Macarthur Critchley Primary Care Physician: Juanell Fairly Other Clinician: Referring Physician: Juanell Fairly Treating Physician/Extender: Frann Rider in Treatment: 9 Debridement Performed for Wound #1 Right Toe Great Assessment: Performed By: Physician Christin Fudge, MD Debridement: Debridement Pre-procedure Yes Verification/Time Out Taken: Start Time: 01:15 Pain Control: Lidocaine 5% topical ointment Level: Skin/Subcutaneous Tissue/Muscle Total Area Debrided (L x 1.5 (cm) x 1.5 (cm) = 2.25 (cm) W): Tissue and other Viable, Non-Viable, Cartilage, Fibrin/Slough, Muscle, Subcutaneous material debrided: Instrument: Curette, Forceps Bleeding: Moderate Hemostasis Achieved: Pressure End Time: 01:23 Procedural Pain: 2 Post Procedural Pain: 0 Response to Treatment: Procedure was tolerated well Post Debridement Measurements  of Total Wound Length: (cm) 0.5 Width: (cm) 0.5 Depth: (cm) 0.4 Volume: (cm) 0.079 Post Procedure Diagnosis Same as Pre-procedure Electronic Signature(s) Signed: 02/18/2016 1:25:16 PM By: Christin Fudge MD, FACS Signed: 02/18/2016 4:54:11 PM By: [name] RN, [name] Entered By: Christin Fudge on 02/18/2016 13:25:16 NESS, VANESSEN ([id number]) -------------------------------------------------------------------------------- Debridement Details Patient Name: Willie Krueger. Date of Service: 02/18/2016 12:45 PM Medical Record Number: [id number] Patient Account Number: [id number] Date of Birth/Sex: 09-20-1956 (60 y.o. Male) Treating RN: Macarthur Critchley Primary Care Physician: Juanell Fairly Other Clinician: Referring Physician: Juanell Fairly Treating Physician/Extender: Frann Rider in Treatment: 9 Debridement Performed for Wound #1 Right Toe Great Assessment: Performed By: Physician Christin Fudge, MD Debridement: Debridement Pre-procedure Yes Verification/Time Out Taken: Start Time: 13:04 Pain Control: Lidocaine 4% Topical Solution Total Area Debrided (L x 1.5 (cm) x 1.5 (cm) = 2.25 (cm) W): Instrument: Curette Bleeding: Minimum Hemostasis Achieved: Pressure End Time: 13:10 Procedural Pain: 0 Post Procedural Pain: 0 Response to Treatment: Procedure was tolerated well Post Debridement Measurements of Total Wound Length: (cm) 0.5 Width: (cm) 0.6 Depth: (cm) 0.4 Volume: (cm) 0.094 Post Procedure Diagnosis Same as Pre-procedure Electronic Signature(s) Signed: 02/18/2016 2:58:24 PM By: Christin Fudge MD, FACS Signed: 02/18/2016 4:54:11 PM By: [name] RN, [name] Entered By: [name] RN, [name] on 02/18/2016 13:29:32 TI, PELKEY ([id number]) -------------------------------------------------------------------------------- HPI Details Patient Name: Willie Krueger. Date of Service: 02/18/2016 12:45 PM Medical Record Number: [id number] Patient Account Number:  [id number] Date of Birth/Sex: [birth year] (60 y.o. Male) Treating RN: Macarthur Critchley Primary Care Physician: Juanell Fairly Other Clinician: Referring Physician: Juanell Fairly Treating Physician/Extender: Frann Rider in Treatment: 9 History of Present Illness Location: ulcerated area on his right big toe Quality: Patient reports No Pain. Severity: Patient states wound are getting worse. Duration: Patient has had the wound for > 3 months prior to seeking treatment at the wound center Timing: Pain in wound is Intermittent (comes and goes Context: The wound appeared gradually over time Modifying Factors: Consults to this date include: infectious disease who has put him  on IV antibiotics with a PICC line Associated Signs and Symptoms: Patient reports having difficulty standing for long periods. HPI Description: 60 year old gentleman with multiple medical problems with a past medical history of diabetes mellitus, hypertension, neuropathy, chronic pain and nicotine abuse. he was recently seen by Dr. Vincenza Hews of infectious disease at [facility] for a osteomyelitis of the distal phalanx of his right great toe. He also had diffuse cellulitis of the right lower leg. He has been advised to have a PICC line and will receive IV vancomycin and oral ciprofloxacin plus oral metronidazole. he had an MRI which was showing edema of the distal phalanx with probably represent osteomyelitis. He smokes a pack and a half of cigarettes a day. his last hemoglobin A1c was 8% A venous duplex of the right lower extremity was done recently on 11/30/2015 and this showed no evidence of DVT, superficial thrombophlebitis or incompetence. 01/11/2016 -- the patient only comes here once every 2 weeks and says he has several appointments and other issues which prevented him from coming for wound care. He has declined hyperbaric oxygen therapy, declined to stop smoking and has declined to see a surgeon regarding  options for his toe. 02/18/2016 -- the patient has not seen [place] for the last 5 weeks and has several excuses for not being here. In the meanwhile he says his PICC line was removed and he is on no antibiotics and his ID specialist Dr. Vincenza Hews told him that his infection was all gone. The patient continues to smoke, says his diabetes is under good control and he has not been doing any specific dressing to his right big toe. I have reviewed Dr. Hale Bogus note from 01/23/2016 -- he had completed 45 days of IV antibiotic with vancomycin, oral ciprofloxacin and oral metronidazole for the right great toe osteomyelitis. He thought that clinically the osteomyelitis has been cured and he stopped his IV antibiotics and had the PICC line removed. Follow-up in 6 weeks was recommended. I have just spoken to Dr. Velva Harman just now and he will call for the patient to review the cultures from today and put him on appropriate antibiotic. Electronic Signature(s) Signed: 02/18/2016 4:52:18 PM By: Christin Fudge MD, FACS Previous Signature: 02/18/2016 2:13:48 PM Version By: Christin Fudge MD, FACS Previous Signature: 02/18/2016 2:09:23 PM Version By: Christin Fudge MD, FACS QUASHAWN, FIKES ([id number]) Previous Signature: 02/18/2016 1:26:27 PM Version By: Christin Fudge MD, FACS Entered By: Christin Fudge on 02/18/2016 16:52:18 HAMISH, TRAYER ([id number]) -------------------------------------------------------------------------------- Physical Exam Details Patient Name: FINNLY, BRIETZKE. Date of Service: 02/18/2016 12:45 PM Medical Record Number: [id number] Patient Account Number: [id number] Date of Birth/Sex: 02/14/1956 (60 y.o. Male) Treating RN: Macarthur Critchley Primary Care Physician: Juanell Fairly Other Clinician: Referring Physician: Juanell Fairly Treating Physician/Extender: Frann Rider in Treatment: 9 Constitutional . Pulse regular. Respirations normal and unlabored. Afebrile. . Eyes Nonicteric.  Reactive to light. Ears, Nose, Mouth, and Throat Lips, teeth, and gums WNL.Marland Kitchen Moist mucosa without lesions. Neck supple and nontender. No palpable supraclavicular or cervical adenopathy. Normal sized without goiter. Respiratory WNL. No retractions.. Cardiovascular Pedal Pulses WNL. No clubbing, cyanosis or edema. Lymphatic No adneopathy. No adenopathy. No adenopathy. Musculoskeletal Adexa without tenderness or enlargement.. Digits and nails w/o clubbing, cyanosis, infection, petechiae, ischemia, or inflammatory conditions.. Integumentary (Hair, Skin) No suspicious lesions. No crepitus or fluctuance. No peri-wound warmth or erythema. No masses.Marland Kitchen Psychiatric Judgement and insight Intact.. No evidence of depression, anxiety, or agitation.. Notes the patient had a large amount of callus  built up and sharp debridement was done with a curette and once this came down to the subcutaneous tissue there was a open ulcer which probes down to bone. I have taken deep tissue culture and sent it for culture and sensitivity. I was able to probe down to bone but no bone biopsy was taken and no sequestered bone was easily obtained. Electronic Signature(s) Signed: 02/18/2016 1:27:34 PM By: Christin Fudge MD, FACS Entered By: Christin Fudge on 02/18/2016 13:27:34 CHANTRY, TORCHIO ([id number]) -------------------------------------------------------------------------------- Physician Orders Details Patient Name: FENNER, PROBUS. Date of Service: 02/18/2016 12:45 PM Medical Record Number: [id number] Patient Account Number: [id number] Date of Birth/Sex: Oct 06, 1956 (60 y.o. Male) Treating RN: Macarthur Critchley Primary Care Physician: Juanell Fairly Other Clinician: Referring Physician: Juanell Fairly Treating Physician/Extender: Frann Rider in Treatment: 9 Verbal / Phone Orders: Yes Clinician: Macarthur Critchley Read Back and Verified: Yes Diagnosis Coding ICD-10 Coding Code Description E11.621 Type 2  diabetes mellitus with foot ulcer L97.514 Non-pressure chronic ulcer of other part of right foot with necrosis of bone M86.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, right ankle and foot F17.218 Nicotine dependence, cigarettes, with other nicotine-induced disorders L84 Corns and callosities Wound Cleansing Wound #1 Right Toe Great o Cleanse wound with mild soap and water o May Shower, gently pat wound dry prior to applying new dressing. o May shower with protection. Primary Wound Dressing Wound #1 Right Toe Great o Aquacel Ag Secondary Dressing Wound #1 Right Toe Great o Gauze and Kerlix/Conform Dressing Change Frequency Wound #1 Right Toe Great o Change dressing every other day. Follow-up Appointments Wound #1 Right Toe Great o Return Appointment in 1 week. Off-Loading Wound #1 Right Toe Great o Open toe surgical shoe with peg assist. o Other: - felt ERYX, FARRUGGIA ([id number]) Laboratory o Bacteria identified in Tissue by Biopsy culture (MICRO) - right great toe tissue culture oooo LOINC Code: [id number] oooo Convenience Name: Biospy specimen culture Electronic Signature(s) Signed: 02/18/2016 2:58:24 PM By: Christin Fudge MD, FACS Signed: 02/18/2016 4:54:11 PM By: [name] RN, [name] Entered By: [name] RN, [name] on 02/18/2016 13:31:26 TAKAO, GRANADOS ([id number]) -------------------------------------------------------------------------------- Problem List Details Patient Name: JAIMEE, BOSHER. Date of Service: 02/18/2016 12:45 PM Medical Record Number: [id number] Patient Account Number: [id number] Date of Birth/Sex: 1956/11/23 (60 y.o. Male) Treating RN: Macarthur Critchley Primary Care Physician: Juanell Fairly Other Clinician: Referring Physician: Juanell Fairly Treating Physician/Extender: Frann Rider in Treatment: 9 Active Problems ICD-10 Encounter Code Description Active Date Diagnosis E11.621 Type 2 diabetes mellitus with foot ulcer 12/14/2015  Yes L97.514 Non-pressure chronic ulcer of other part of right foot with 12/14/2015 Yes necrosis of bone M86.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, right ankle and foot 12/14/2015 Yes F17.218 Nicotine dependence, cigarettes, with other nicotine- 12/14/2015 Yes induced disorders L84 Corns and callosities 12/14/2015 Yes Inactive Problems Resolved Problems Electronic Signature(s) Signed: 02/18/2016 1:23:11 PM By: Christin Fudge MD, FACS Previous Signature: 02/18/2016 1:22:57 PM Version By: Christin Fudge MD, FACS Entered By: Christin Fudge on 02/18/2016 13:23:11 GIOVANNY, SCARCELLA ([id number]) -------------------------------------------------------------------------------- Progress Note Details Patient Name: LAYTON, PODOLSKI. Date of Service: 02/18/2016 12:45 PM Medical Record Number: [id number] Patient Account Number: [id number] Date of Birth/Sex: 08-11-1956 (60 y.o. Male) Treating RN: Macarthur Critchley Primary Care Physician: Juanell Fairly Other Clinician: Referring Physician: Juanell Fairly Treating Physician/Extender: Frann Rider in Treatment: 9 Subjective Chief Complaint Information obtained from Patient Patients presents for treatment of an open diabetic ulcer to his right big toe which she's had for several months History of Present Illness (HPI) The following  HPI elements were documented for the patient's wound: Location: ulcerated area on his right big toe Quality: Patient reports No Pain. Severity: Patient states wound are getting worse. Duration: Patient has had the wound for > 3 months prior to seeking treatment at the wound center Timing: Pain in wound is Intermittent (comes and goes Context: The wound appeared gradually over time Modifying Factors: Consults to this date include: infectious disease who has put him on IV antibiotics with a PICC line Associated Signs and Symptoms: Patient reports having difficulty standing for long periods. 60 year old gentleman with multiple  medical problems with a past medical history of diabetes mellitus, hypertension, neuropathy, chronic pain and nicotine abuse. he was recently seen by Dr. Vincenza Hews of infectious disease at [facility] for a osteomyelitis of the distal phalanx of his right great toe. He also had diffuse cellulitis of the right lower leg. He has been advised to have a PICC line and will receive IV vancomycin and oral ciprofloxacin plus oral metronidazole. he had an MRI which was showing edema of the distal phalanx with probably represent osteomyelitis. He smokes a pack and a half of cigarettes a day. his last hemoglobin A1c was 8% A venous duplex of the right lower extremity was done recently on 11/30/2015 and this showed no evidence of DVT, superficial thrombophlebitis or incompetence. 01/11/2016 -- the patient only comes here once every 2 weeks and says he has several appointments and other issues which prevented him from coming for wound care. He has declined hyperbaric oxygen therapy, declined to stop smoking and has declined to see a surgeon regarding options for his toe. 02/18/2016 -- the patient has not seen [place] for the last 5 weeks and has several excuses for not being here. In the meanwhile he says his PICC line was removed and he is on no antibiotics and his ID specialist Dr. Vincenza Hews told him that his infection was all gone. The patient continues to smoke, says his diabetes is under good control and he has not been doing any specific dressing to his right big toe. I have reviewed Dr. Hale Bogus note from 01/23/2016 -- he had completed 45 days of IV antibiotic with vancomycin, oral ciprofloxacin and oral metronidazole for the right great toe osteomyelitis. DONYELL, CARITHERS [place]. ([id number]) He thought that clinically the osteomyelitis has been cured and he stopped his IV antibiotics and had the PICC line removed. Follow-up in 6 weeks was recommended. I have just spoken to Dr. Velva Harman just now and he  will call for the patient to review the cultures from today and put him on appropriate antibiotic. Objective Constitutional Pulse regular. Respirations normal and unlabored. Afebrile. Vitals Time Taken: 12:48 PM, Height: 74 in, Weight: 242 lbs, BMI: 31.1, Temperature: 98.3 F, Pulse: 78 bpm, Blood Pressure: 133/83 mmHg. Eyes Nonicteric. Reactive to light. Ears, Nose, Mouth, and Throat Lips, teeth, and gums WNL.Marland Kitchen Moist mucosa without lesions. Neck supple and nontender. No palpable supraclavicular or cervical adenopathy. Normal sized without goiter. Respiratory WNL. No retractions.. Cardiovascular Pedal Pulses WNL. No clubbing, cyanosis or edema. Lymphatic No adneopathy. No adenopathy. No adenopathy. Musculoskeletal Adexa without tenderness or enlargement.. Digits and nails w/o clubbing, cyanosis, infection, petechiae, ischemia, or inflammatory conditions.Marland Kitchen Psychiatric Judgement and insight Intact.. No evidence of depression, anxiety, or agitation.. General Notes: the patient had a large amount of callus built up and sharp debridement was done with a curette and once this came down to the subcutaneous tissue there was a open ulcer which probes down to bone.  I have taken deep tissue culture and sent it for culture and sensitivity. I was able to probe down to bone but no bone biopsy was taken and no sequestered bone was easily obtained. SHAWNMICHAEL, ANDERS [place]. ([id number]) Integumentary (Hair, Skin) No suspicious lesions. No crepitus or fluctuance. No peri-wound warmth or erythema. No masses.. Wound #1 status is Open. Original cause of wound was Gradually Appeared. The wound is located on the Right Toe Great. The wound measures 0.5cm length x 0.6cm width x 0.4cm depth; 0.236cm^2 area and 0.094cm^3 volume. The wound is limited to skin breakdown. There is no tunneling or undermining noted. There is a none present amount of drainage noted. The wound margin is distinct with the outline attached  to the wound base. There is no granulation within the wound bed. There is a large (67-100%) amount of necrotic tissue within the wound bed including Eschar and Adherent Slough. The periwound skin appearance exhibited: Callus, Localized Edema, Dry/Scaly. The periwound skin appearance did not exhibit: Crepitus, Excoriation, Fluctuance, Friable, Induration, Rash, Scarring, Maceration, Moist, Atrophie Blanche, Cyanosis, Ecchymosis, Hemosiderin Staining, Mottled, Pallor, Rubor, Erythema. Periwound temperature was noted as No Abnormality. 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 He also has a nicotine addiction and has several other comorbidities. I have recommended: 1. Aquacel Ag over the open ulcerated area and a bordered foam. 2. Offloading shoes which she already has but has not been using regularly and I have stressed the importance of this 3. Continue to see and book an appointment with Dr. Vincenza Hews of infectious disease 4. Patient has several excuses but says he will try to be compliant about giving up smoking 5. he declines to be treated with Hyperbaric oxygen therapy with the protocol for osteomyelitis to be given over 8 weeks with 40 sittings. 6. he declines a surgical consultation until he sees his ID specialist the patient has had several questions all answered to his satisfaction. this time around he says he will come regularly and will also call his infectious disease consultant for a review. MATTSON, SOUTHERN  ([id number]) Procedures Wound #1 Wound #1 is a Diabetic Wound/Ulcer of the Lower Extremity located on the Right Toe Great . There was a Skin/Subcutaneous Tissue/Muscle Debridement [id number] debridement with total area of 2.25 sq cm performed by Christin Fudge, MD. with the following instrument(s): Curette and Forceps to remove Viable and Non-Viable tissue/material including Cartilage, Fibrin/Slough, Muscle, and Subcutaneous after achieving pain control using Lidocaine 5% topical ointment. A time out was conducted prior to the start of the procedure. A Moderate amount of bleeding was controlled with Pressure. The procedure was tolerated well with a pain level of 2 throughout and a pain level of 0 following the procedure. Post Debridement Measurements: 0.5cm length x 0.5cm width x 0.4cm depth; 0.079cm^3 volume. Post procedure Diagnosis Wound #1: Same as Pre-Procedure Wound #1 is a Diabetic Wound/Ulcer of the Lower Extremity located on the Right Toe Great . There was a Debridement [id number] debridement with total area of 2.25 sq cm performed by Christin Fudge, MD. with the following instrument(s): Curette after achieving pain  control using Lidocaine 4% Topical Solution. A time out was conducted prior to the start of the procedure. A Minimum amount of bleeding was controlled with Pressure. The procedure was tolerated well with a pain level of 0 throughout and a pain level of 0 following the procedure. Post Debridement Measurements: 0.5cm length x 0.6cm width x 0.4cm depth; 0.094cm^3 volume. Post procedure Diagnosis Wound #1: Same as Pre-Procedure Plan Wound Cleansing: Wound #1 Right Toe Great: Cleanse wound with mild soap and water May Shower, gently pat wound dry prior to applying new dressing. May shower with protection. Primary Wound Dressing: Wound #1 Right Toe Great: Aquacel Ag Secondary Dressing: Wound #1 Right Toe Great: Gauze and Kerlix/Conform Dressing Change  Frequency: Wound #1 Right Toe Great: Change dressing every other day. Follow-up Appointments: Wound #1 Right Toe Great: Return Appointment in 1 week. Off-Loading: Wound #1 Right Toe GreatLINO, KLUTZ. ([id number]) Open toe surgical shoe with peg assist. Other: - felt Laboratory ordered were: Biospy specimen culture - right great toe tissue culture This 60 year old gentleman has a Wagner stage III diabetic foot ulcer with osteomyelitis at the tip of his right big toe. He also has a nicotine addiction and has several other comorbidities. I have recommended: 1. Aquacel Ag over the open ulcerated area and a bordered foam. 2. Offloading shoes which she already has but has not been using regularly and I have stressed the importance of this 3. Continue to see and book an appointment with Dr. Vincenza Hews of infectious disease 4. Patient has several excuses but says he will try to be compliant about giving up smoking 5. he declines to be treated with Hyperbaric oxygen therapy with the protocol for osteomyelitis to be given over 8 weeks with 40 sittings. 6. he declines a surgical consultation until he sees his ID specialist the patient has had several questions all answered to his satisfaction. this time around he says he will come regularly and will also call his infectious disease consultant for a review. Electronic Signature(s) Signed: 02/18/2016 4:52:31 PM By: Christin Fudge MD, FACS Previous Signature: 02/18/2016 1:29:28 PM Version By: Christin Fudge MD, FACS Entered By: Christin Fudge on 02/18/2016 16:52:31 REMBRANDT, DAYAN ([id number]) -------------------------------------------------------------------------------- SuperBill Details Patient Name: BAYLER, BOYACK. Date of Service: 02/18/2016 Medical Record Number: [id number] Patient Account Number: [id number] Date of Birth/Sex: 09/27/1956 (60 y.o. Male) Treating RN: Macarthur Critchley Primary Care Physician: Juanell Fairly Other  Clinician: Referring Physician: Juanell Fairly Treating Physician/Extender: Frann Rider in Treatment: 9 Diagnosis Coding ICD-10 Codes Code Description E11.621 Type 2 diabetes mellitus with foot ulcer L97.514 Non-pressure chronic ulcer of other part of right foot with necrosis of bone M86.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, right ankle and foot F17.218 Nicotine dependence, cigarettes, with other nicotine-induced disorders L84 Corns and callosities Facility Procedures CPT4 Code Description: [id number] - DEB MUSC/FASCIA 20 SQ CM/< ICD-10 Description Diagnosis E11.621 Type 2 diabetes mellitus with foot ulcer L97.514 Non-pressure chronic ulcer of other part of right fo M86.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, right  ankle and fo F17.218 Nicotine dependence, cigarettes, with other nicotine Modifier: ot with necros ot -induced disor Quantity: 1 is of bone ders Physician Procedures CPT4 Code Description: [id number] - WC PHYS LEVEL 3 - EST PT ICD-10 Description Diagnosis E11.621 Type 2 diabetes mellitus with foot ulcer 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.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, right  ankle and foo F17.218 Nicotine dependence, cigarettes, with other nicotine- Modifier: 25 t with necros t induced disor Quantity:  1 is of bone ders CPT4 Code Description: [id number] - WC PHYS DEBR MUSCLE/FASCIA 20 SQ CM ICD-10 Description Diagnosis E11.621 Type 2 diabetes mellitus with foot ulcer 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

## 2016-02-19 ENCOUNTER — Other Ambulatory Visit
Admission: RE | Admit: 2016-02-19 | Discharge: 2016-02-19 | Disposition: A | Discharge status: Home / Self Care | Payer: 59 | Source: Ambulatory Visit | Attending: Surgery | Admitting: Surgery

## 2016-02-19 DIAGNOSIS — B999 Unspecified infectious disease: Secondary | ICD-10-CM | POA: Insufficient documentation

## 2016-02-19 NOTE — Progress Notes (Signed)
MEDRICK, DEHAAS ([id number]) Visit Report for 02/18/2016 Arrival Information Details Patient Name: Willie Krueger, Willie Krueger. Date of Service: 02/18/2016 12:45 PM Medical Record Number: [id number] Patient Account Number: [id number] Date of Birth/Sex: 11-25-1956 (60 y.o. Male) Treating RN: Macarthur Critchley Primary Care Physician: Juanell Fairly Other Clinician: Referring Physician: Juanell Fairly Treating Physician/Extender: Frann Rider in Treatment: 9 Visit Information History Since Last Visit All ordered tests and consults were No Patient Arrived: Ambulatory completed: Arrival Time: 12:46 Added or deleted any medications: No Accompanied By: sef Any new allergies or adverse reactions: No Transfer Assistance: None Had a fall or experienced change in No Patient Identification Verified: Yes activities of daily living that may affect Secondary Verification Process Yes risk of falls: Completed: Signs or symptoms of abuse/neglect since No Patient Requires Transmission- No last visito Based Precautions: Hospitalized since last visit: No Patient Has Alerts: Yes Has Dressing in Place as Prescribed: Yes Patient Alerts: Patient on Blood Has Footwear/Offloading in Place as Yes Thinner Prescribed: Aspirin, Dm2 Left: Other:offoading Pain Present Now: No Electronic Signature(s) Signed: 02/18/2016 4:54:11 PM By: [name], RN, [name] Entered By: [name] RN, [name] on 02/18/2016 12:46:55 [name] ([id number]) -------------------------------------------------------------------------------- Encounter Discharge Information Details Patient Name: Willie Krueger, Willie Krueger. Date of Service: 02/18/2016 12:45 PM Medical Record Number: [id number] Patient Account Number: [id number] Date of Birth/Sex: 14-Sep-1956 (60 y.o. Male) Treating RN: Macarthur Critchley Primary Care Physician: Juanell Fairly Other Clinician: Referring Physician: Juanell Fairly Treating Physician/Extender: Frann Rider in Treatment:  9 Encounter Discharge Information Items Discharge Pain Level: 0 Discharge Condition: Stable Ambulatory Status: Ambulatory Discharge Destination: Home Transportation: Private Auto Accompanied By: self Schedule Follow-up Appointment: Yes Medication Reconciliation completed and provided to Patient/Care Yes [name]: Provided on Clinical Summary of Care: 02/18/2016 Form Type Recipient Paper Patient GM Electronic Signature(s) Signed: 02/18/2016 4:54:11 PM By: [name] RN, [name] Previous Signature: 02/18/2016 1:30:41 PM Version By: Ruthine Dose Entered By: [name] RN, [name] on 02/18/2016 13:32:37 Willie Krueger, Willie Krueger ([id number]) -------------------------------------------------------------------------------- Lower Extremity Assessment Details Patient Name: Willie Krueger, Willie Krueger. Date of Service: 02/18/2016 12:45 PM Medical Record Number: [id number] Patient Account Number: [id number] Date of Birth/Sex: Oct 23, 1956 (60 y.o. Male) Treating RN: Macarthur Critchley Primary Care Physician: Juanell Fairly Other Clinician: Referring Physician: Juanell Fairly Treating Physician/Extender: Frann Rider in Treatment: 9 Edema Assessment Assessed: [name]: No] [name]: No] Edema: [Left: Ye] [Right: s] Vascular Assessment Pulses: Posterior Tibial Dorsalis Pedis Palpable: [Right:Yes] Extremity colors, hair growth, and conditions: Extremity Color: [Right:Normal] Hair Growth on Extremity: [Right:Yes] Temperature of Extremity: [Right:Warm] Capillary Refill: [Right:< 3 seconds] Toe Nail Assessment Left: Right: Thick: No Discolored: No Deformed: No Improper Length and Hygiene: Yes Notes great right toe swollen, red, denies pain Electronic Signature(s) Signed: 02/18/2016 4:54:11 PM By: [name], RN, [name] Entered By: [name] RN, [name] on 02/18/2016 12:59:51 [name], [name] ([id number]) -------------------------------------------------------------------------------- Pain Assessment Details Patient  Name: Willie Krueger, Willie Krueger. Date of Service: 02/18/2016 12:45 PM Medical Record Number: [id number] Patient Account Number: [id number] Date of Birth/Sex: 1956-11-21 (60 y.o. Male) Treating RN: Macarthur Critchley Primary Care Physician: Juanell Fairly Other Clinician: Referring Physician: Juanell Fairly Treating Physician/Extender: Frann Rider in Treatment: 9 Active Problems Location of Pain Severity and Description of Pain Patient Has Paino No Site Locations Rate the pain. Current Pain Level: 0 Pain Management and Medication Current Pain Management: Electronic Signature(s) Signed: 02/18/2016 4:54:11 PM By: [name], RN, [name] Entered By: [name] RN, [name] on 02/18/2016 12:47:04 [name] ([id number]) -------------------------------------------------------------------------------- Patient/Caregiver Education Details Patient Name: Willie Krueger, Willie Krueger. Date of Service: 02/18/2016 12:45 PM  Medical Record Number: [id number] Patient Account Number: [id number] Date of Birth/Gender: [birth year] (60 y.o. Male) Treating RN: Macarthur Critchley Primary Care Physician: Juanell Fairly Other Clinician: Referring Physician: Juanell Fairly Treating Physician/Extender: Frann Rider in Treatment: 9 Education Assessment Education Provided To: Patient Education Topics Provided Wound/Skin Impairment: Caring for Your Ulcer, Skin Care Do's and Dont's, Smoking and Wound Healing, Other: Handouts: amputation Methods: Explain/Verbal Responses: State content correctly Electronic Signature(s) Signed: 02/18/2016 4:54:11 PM By: [name], RN, [name] Entered By: [name] RN, [name] on 02/18/2016 13:32:54 Willie Krueger, Willie Krueger ([id number]) -------------------------------------------------------------------------------- Wound Assessment Details Patient Name: Willie Krueger, Willie Krueger. Date of Service: 02/18/2016 12:45 PM Medical Record Number: [id number] Patient Account Number: [id number] Date of Birth/Sex: April 07, 1956 (60 y.o.  Male) Treating RN: Macarthur Critchley Primary Care Physician: Juanell Fairly Other Clinician: Referring Physician: Juanell Fairly Treating Physician/Extender: Frann Rider in Treatment: 9 Wound Status Wound Number: 1 Primary Diabetic Wound/Ulcer of the Lower Etiology: Extremity Wound Location: Right Toe Great Wound Open Wounding Event: Gradually Appeared Status: Date Acquired: 07/02/2015 Comorbid Anemia, Asthma, Hypertension, Type Weeks Of Treatment: 9 History: II Diabetes, Neuropathy Clustered Wound: No Photos Photo Uploaded By: [name], RN, [name] on 02/18/2016 16:51:40 Wound Measurements Length: (cm) 0.5 Width: (cm) 0.6 Depth: (cm) 0.4 Area: (cm) 0.236 Volume: (cm) 0.094 % Reduction in Area: 57.1% % Reduction in Volume: -70.9% Epithelialization: None Tunneling: No Undermining: No Wound Description Classification: Grade 1 Wound Margin: Distinct, outline attached Exudate Amount: None Present Foul Odor After Cleansing: No Wound Bed Granulation Amount: None Present (0%) Exposed Structure Necrotic Amount: Large (67-100%) Fascia Exposed: No Necrotic Quality: Eschar, Adherent Slough Fat Layer Exposed: No Tendon Exposed: No Muscle Exposed: No Joint Exposed: No Willie Krueger, Willie Krueger ([id number]) Bone Exposed: No Limited to Skin Breakdown Periwound Skin Texture Texture Color No Abnormalities Noted: No No Abnormalities Noted: No Callus: Yes Atrophie Blanche: No Crepitus: No Cyanosis: No Excoriation: No Ecchymosis: No Fluctuance: No Erythema: No Friable: No Hemosiderin Staining: No Induration: No Mottled: No Localized Edema: Yes Pallor: No Rash: No Rubor: No Scarring: No Temperature / Pain Moisture Temperature: No Abnormality No Abnormalities Noted: No Dry / Scaly: Yes Maceration: No Moist: No Wound Preparation Ulcer Cleansing: Rinsed/Irrigated with Saline Topical Anesthetic Applied: Other: lidocaine 4%, Assessment Notes wound is calloused  over Treatment Notes Wound #1 (Right Toe Great) 1. Cleansed with: Clean wound with Normal Saline 2. Anesthetic Topical Lidocaine 4% cream to wound bed prior to debridement 4. Dressing Applied: Aquacel Ag 5. Secondary Dressing Applied Gauze and Kerlix/Conform Notes tissue culture sent of right great toe Electronic Signature(s) Signed: 02/18/2016 4:54:11 PM By: [name], RN, [name] Entered By: [name] RN, [name] on 02/18/2016 15:48:46 Willie Krueger, ARRIAZA ([id number]) -------------------------------------------------------------------------------- [place] Details Patient Name: Willie Krueger, Willie Krueger. Date of Service: 02/18/2016 12:45 PM Medical Record Number: [id number] Patient Account Number: [id number] Date of Birth/Sex: 04-28-1956 (60 y.o. Male) Treating RN: Macarthur Critchley Primary Care Physician: Juanell Fairly Other Clinician: Referring Physician: Juanell Fairly Treating Physician/Extender: Frann Rider in Treatment: 9 Vital Signs Time Taken: 12:48 Temperature (F): 98.3 Height (in): 74 Pulse (bpm): 78 Weight (lbs): 242 Blood Pressure (mmHg): 133/83 Body Mass Index (BMI): 31.1 Reference Range: 80 - 120 mg / dl Electronic Signature(s) Signed: 02/18/2016 4:54:11 PM By: [name], RN, [name] Entered By: [name] RN, [name] on 02/18/2016 12:49:11

## 2016-02-20 ENCOUNTER — Encounter: Payer: Self-pay | Admitting: Internal Medicine

## 2016-02-20 ENCOUNTER — Ambulatory Visit (INDEPENDENT_AMBULATORY_CARE_PROVIDER_SITE_OTHER): Payer: 59 | Admitting: Internal Medicine

## 2016-02-20 DIAGNOSIS — E1169 Type 2 diabetes mellitus with other specified complication: Secondary | ICD-10-CM | POA: Diagnosis not present

## 2016-02-20 DIAGNOSIS — M869 Osteomyelitis, unspecified: Secondary | ICD-10-CM

## 2016-02-20 LAB — CBC
HEMATOCRIT: 41.8 % (ref 39.0–52.0)
HEMOGLOBIN: 14.1 g/dL (ref 13.0–17.0)
MCH: 31.4 pg (ref 26.0–34.0)
MCHC: 33.7 g/dL (ref 30.0–36.0)
MCV: 93.1 fL (ref 78.0–100.0)
MPV: 10.4 fL (ref 8.6–12.4)
Platelets: 192 10*3/uL (ref 150–400)
RBC: 4.49 MIL/uL (ref 4.22–5.81)
RDW: 14.2 % (ref 11.5–15.5)
WBC: 7.4 10*3/uL (ref 4.0–10.5)

## 2016-02-20 LAB — C-REACTIVE PROTEIN: CRP: 1.3 mg/dL — ABNORMAL HIGH (ref <0.60)

## 2016-02-20 LAB — BASIC METABOLIC PANEL
BUN: 16 mg/dL (ref 7–25)
CALCIUM: 9.4 mg/dL (ref 8.6–10.3)
CO2: 24 mmol/L (ref 20–31)
CREATININE: 1.02 mg/dL (ref 0.70–1.25)
Chloride: 105 mmol/L (ref 98–110)
Glucose, Bld: 160 mg/dL — ABNORMAL HIGH (ref 65–99)
Potassium: 4.9 mmol/L (ref 3.5–5.3)
Sodium: 136 mmol/L (ref 135–146)

## 2016-02-20 LAB — SEDIMENTATION RATE: Sed Rate: 11 mm/hr (ref 0–20)

## 2016-02-20 NOTE — Assessment & Plan Note (Signed)
Based on Dr. Ardeen Garland recent evaluation it appears that he has persistent infection in his right great toe tracking down to bone. I'll repeat lab work today and await results of his wound cultures before making a final decision about antibiotic therapy. We will also need to consider repeat foot MRI.

## 2016-02-20 NOTE — Progress Notes (Signed)
[facility]  Patient Active Problem List   Diagnosis Date Noted  . Diabetic osteomyelitis ([place]) 12/05/2015    Priority: High  . Obesity 12/05/2015    Priority: Medium  . Chronic pain 10/31/2015    Priority: Medium  . HLD (hyperlipidemia) 10/20/2012    Priority: Medium  . Type 2 diabetes mellitus ([place]) 09/08/2012    Priority: Medium  . Current smoker 10/02/2011    Priority: Medium  . Neuropathy ([place]) 12/05/2015  . Encounter for chronic pain management 12/03/2015  . Elevated C-reactive protein (CRP) 12/03/2015  . Long term current use of opiate analgesic 10/31/2015  . Long term prescription opiate use 10/31/2015  . Opiate use 10/31/2015  . Opiate dependence ([place]) 10/31/2015  . Encounter for therapeutic drug level monitoring 10/31/2015  . Chronic neck pain (Right side) 10/31/2015  . Cervical spondylosis 10/31/2015  . Cervical post-laminectomy syndrome 10/31/2015  . Chronic low back pain (Midline pain) (R>L) (Primary Pain) 10/31/2015  . Bilateral lower extremity pain 10/31/2015  . Chronic cervical radicular pain (Bilateral) (R>L) 10/31/2015  . Chronic lumbar radicular pain (Bilateral) (R>L) 10/31/2015  . Substance use disorder Risk: LOW 10/31/2015  . 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 Epidural fibrosis 10/31/2015  . Chronic bilateral lower extremity pain 10/31/2015  . History of carpal tunnel release of both wrists 10/31/2015  . Grade I anterolisthesis of C7 on T1 10/31/2015  . History of spinal stenosis (Before surgery) 10/31/2015    Class: History of  . Toe ulcer ([place]) 06/24/2015  . Chronic pain associated with significant psychosocial dysfunction 05/04/2015  . Cervical spinal cord compression ([place]) 02/07/2015  . Nerve root pain 12/07/2014  . MI (mitral incompetence) 11/11/2014  . Cardiac murmur 11/09/2014  . Degenerative arthritis of hip 07/26/2014  . Leg weakness 07/07/2014  . Nerve root inflammation 06/26/2014  .  History of decompression of median nerve 05/19/2014  . Benign hypertension 10/20/2012  . 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 Chronic hepatitis C virus infection ([place]) 10/02/2011  . Adiposity 10/02/2011    Patient's Medications  New Prescriptions   No medications on file  Previous Medications   ACETAMINOPHEN (TYLENOL) 500 MG TABLET    Take 500 mg by mouth every 4 (four) hours. Scheduled (5 times daily)   ASPIRIN EC 81 MG TABLET    Take 81 mg by mouth daily.   CHOLECALCIFEROL (VITAMIN D3) 2000 UNITS TABS    Take 2,000 Units by mouth daily.    DIPHENHYDRAMINE-ACETAMINOPHEN (TYLENOL PM) 25-500 MG TABS TABLET    Take 2 tablets by mouth at bedtime.   FUROSEMIDE (LASIX) 20 MG TABLET    Take 40 mg by mouth 2 (two) times daily.    GABAPENTIN (NEURONTIN) 600 MG TABLET    Take 1,200 mg by mouth every 4 (four) hours. Scheduled (5 times daily)   GLIMEPIRIDE (AMARYL) 2 MG TABLET    Take 2 mg by mouth daily with breakfast.    LISINOPRIL (PRINIVIL,ZESTRIL) 20 MG TABLET    Take 20 mg by mouth at bedtime.    METFORMIN (GLUCOPHAGE-XR) 500 MG 24 HR TABLET    Take 500 mg by mouth 2 (two) times daily.   OXYCODONE (ROXICODONE) 15 MG IMMEDIATE RELEASE TABLET    Take 1 tablet (15 mg total) by mouth 5 (five) times daily as needed for pain.   PRAVASTATIN (PRAVACHOL) 20 MG TABLET    Take 20 mg by mouth at bedtime.  SILDENAFIL (VIAGRA) 100 MG TABLET    Take 100 mg by mouth daily as needed for erectile dysfunction.  Modified Medications   No medications on file  Discontinued Medications   No medications on file    Subjective: Willie Krueger is a 60 y.o. male with multiple medical problems including diabetes, dyslipidemia, hypertension, neuropathy, chronic pain and cigarette use. He underwent bilateral carpal tunnel surgery last year and was left with weakness in both hands. He tells me that he had difficulty performing routine foot care. His toenails became long and thickened and caused his  shoes to fit poorly. He started seeing Dr. Jacqualyn Posey for routine nail trimming and foot care. He had hyperkeratosis over the distal right great toe. In June of this year and ulcerated area over the tip of the toe was noted during Dr. Leigh Aurora exam. He was started on cephalexin but he continued to have diffuse swelling of the toe and foot. X-rays in September did not reveal any evidence of osteomyelitis. It appears he took several courses of cephalexin. However in late October he was seen with increased edema of his right foot associated with warmth. Repeat x-rays reveal possible cortical changes. He was started on clindamycin but developed a rash and was switched to doxycycline which it appears he has been on ever since. His MRI in November revealed "edema of the distal phalanx that probably represents osteomyelitis". He was recently changed to ciprofloxacin. He continued to have the open ulcerated area at the tip of his right great toe and diffuse swelling of his great toe, foot and lower leg. He was referred to me in early December. I did not feel that a bone biopsy was warranted given that he had had some much antibiotic exposure. I started him on IV vancomycin, oral ciprofloxacin and oral metronidazole. He completed 6 weeks of therapy on 01/23/2016. He was improved at that point. I was called from the wound center by Dr. Milas Gain 2 days ago. He had done further debridement over the tip of his right great toe and found purulent tissue the tract down to bone. Specimens were sent for stain and culture and are pending. Willie Krueger is somewhat vague on how his foot has been doing over the past month. It seems that he has swelling that comes and goes. It is worse when he is up on his feet more. He denies any fever, chills or sweats. He continues to smoke cigarettes.  Review of Systems: Review of Systems  Constitutional: Negative for fever, chills, weight loss, malaise/fatigue and diaphoresis.  HENT: Negative  for sore throat.   Respiratory: Negative for cough, sputum production and shortness of breath.   Cardiovascular: Negative for chest pain.  Gastrointestinal: Negative for nausea, vomiting, abdominal pain and diarrhea.  Genitourinary: Negative for dysuria.  Musculoskeletal: Negative for myalgias and joint pain.       Chronic dependent edema of his right leg and foot.  Skin: Negative for rash.  Neurological: Positive for sensory change. Negative for focal weakness and headaches.  Psychiatric/Behavioral: Negative for depression.    Past Medical History  Diagnosis Date  . Diabetes mellitus without complication ([place])   . Hypertension   . Chronic pain   . Hyperlipidemia   . Osteoarthritis   . Heart murmur   . Mitral valve regurgitation   . History of spinal stenosis 10/31/2015    C3-4 with severe central canal stenosis with small amount of myelomalacia present. Moderate to severe canal stenosis is also present at  C4-5 and C5-6. Moderate to severe neuroforaminal narrowing between the levels of C2-C7    Social History  Substance Use Topics  . Smoking status: Current Every Day Smoker -- 1.50 packs/day    Types: Cigarettes  . Smokeless tobacco: Never Used  . Alcohol Use: No    Family History  Problem Relation Age of Onset  . Cancer Mother   . Diabetes Mother   . Heart disease Father     Allergies  Allergen Reactions  . Cleocin [Clindamycin] Rash  . Sulfa Antibiotics Rash    Objective: Filed Vitals:   02/20/16 1010  BP: 161/79  Pulse: 71  Temp: 98.3 F (36.8 C)  TempSrc: Oral  Height: 6\' 2"  (1.88 m)  Weight: 240 lb (108.863 kg)  SpO2: 97%   Body mass index is 30.8 kg/(m^2).  Physical Exam  Constitutional: He is oriented to person, place, and time.  He is in good spirits and in no distress.  HENT:  Mouth/Throat: No oropharyngeal exudate.  Few remaining teeth.  Eyes: Conjunctivae are normal.  Cardiovascular: Normal rate and regular rhythm.   No murmur  heard. Pulmonary/Chest: Breath sounds normal.  Distant breath sounds.  Abdominal: Soft.  Musculoskeletal:  He has 1+ pitting edema of his right lower leg and foot. His foot is warm and appears well perfused. He has no erythema or fluctuance. There is ulceration over the tip of his right great toe with red granulation tissue.  Neurological: He is alert and oriented to person, place, and time.  Skin: No rash noted.  Psychiatric: Mood and affect normal.    Lab Results    Problem List Items Addressed This Visit      High   Diabetic osteomyelitis ([place])    Based on Dr. Ardeen Garland recent evaluation it appears that he has persistent infection in his right great toe tracking down to bone. I'll repeat lab work today and await results of his wound cultures before making a final decision about antibiotic therapy. We will also need to consider repeat foot MRI.      Relevant Orders   C-reactive protein   Sedimentation rate   CBC   Basic metabolic panel       Michel Bickers, MD [facility] for [place] [phone] pager   [phone] cell 02/20/2016, 11:22 AM

## 2016-02-21 ENCOUNTER — Encounter: Payer: Self-pay | Admitting: Internal Medicine

## 2016-02-21 ENCOUNTER — Telehealth: Payer: Self-pay | Admitting: Internal Medicine

## 2016-02-21 NOTE — Telephone Encounter (Signed)
BMET    Component Value Date/Time   NA 136 02/20/2016 1217   NA 138 01/01/2014 2238   K 4.9 02/20/2016 1217   K 4.4 01/01/2014 2238   CL 105 02/20/2016 1217   CL 109* 01/01/2014 2238   CO2 24 02/20/2016 1217   CO2 24 01/01/2014 2238   GLUCOSE 160* 02/20/2016 1217   GLUCOSE 140* 01/01/2014 2238   BUN 16 02/20/2016 1217   BUN 15 01/01/2014 2238   CREATININE 1.02 02/20/2016 1217   CREATININE 1.04 01/01/2014 2238   CALCIUM 9.4 02/20/2016 1217   CALCIUM 8.6 01/01/2014 2238   GFRNONAA >60 01/01/2014 2238   GFRAA >60 01/01/2014 2238   Lab Results  Component Value Date   WBC 7.4 02/20/2016   HGB 14.1 02/20/2016   HCT 41.8 02/20/2016   MCV 93.1 02/20/2016   PLT 192 02/20/2016   SED RATE (mm/hr)  Date Value  02/20/2016 11  11/16/2015 6   CRP (mg/dL)  Date Value  02/20/2016 1.3*  11/16/2015 1.8*   Wound culture 02/18/2016: Growing Escherichia coli resistant to ciprofloxacin which he was on recently but sensitive to cephalosporins and trimethoprim sulfamethoxazole. Culture is not final yet and is being held because of concerns for other organisms being present.  I spoke with Dr. Milas Gain again today about the preliminary culture results. I called Willie Krueger told him that I would call him when I have the final culture results tomorrow to discuss antibiotic options.

## 2016-02-23 ENCOUNTER — Other Ambulatory Visit: Payer: Self-pay | Admitting: Internal Medicine

## 2016-02-23 ENCOUNTER — Telehealth: Payer: Self-pay | Admitting: Internal Medicine

## 2016-02-23 DIAGNOSIS — M869 Osteomyelitis, unspecified: Principal | ICD-10-CM

## 2016-02-23 DIAGNOSIS — E1169 Type 2 diabetes mellitus with other specified complication: Secondary | ICD-10-CM

## 2016-02-23 MED ORDER — AMOXICILLIN-POT CLAVULANATE 875-125 MG PO TABS: 1.0000 | ORAL_TABLET | Freq: Two times a day (BID) | ORAL | Status: DC

## 2016-02-23 MED ORDER — CEFADROXIL 500 MG PO CAPS: 500.0000 mg | ORAL_CAPSULE | Freq: Two times a day (BID) | ORAL | Status: DC

## 2016-02-23 NOTE — Telephone Encounter (Signed)
[name]'s foot cultures have grown Escherichia coli and Enterococcus casselflavus. I suspect that anaerobes are present as well. He is allergic to sulfa antibiotics so I will treat him with cefadroxil and ampicillin sulbactam. He will follow-up with me in one month. I spoke to him by phone this evening and he is in agreement with that plan.

## 2016-02-25 ENCOUNTER — Encounter: Payer: 59 | Admitting: Surgery

## 2016-02-25 DIAGNOSIS — E11621 Type 2 diabetes mellitus with foot ulcer: Secondary | ICD-10-CM | POA: Diagnosis not present

## 2016-02-25 LAB — WOUND CULTURE

## 2016-02-25 NOTE — Progress Notes (Addendum)
Willie Krueger ([id number]) Visit Report for 02/25/2016 Chief Complaint Document Details Patient Name: Willie Krueger, Willie Krueger. Date of Service: 02/25/2016 12:45 PM Medical Record Number: [id number] Patient Account Number: [id number] Date of Birth/Sex: 1956/04/22 (60 y.o. Male) Treating RN: Macarthur Critchley Primary Care Physician: Juanell Fairly Other Clinician: Referring Physician: Juanell Fairly Treating Physician/Extender: Frann Rider in Treatment: 10 Information Obtained from: Patient Chief Complaint Patients presents for treatment of an open diabetic ulcer to his right big toe which she's had for several months Electronic Signature(s) Signed: 02/25/2016 1:25:00 PM By: Christin Fudge MD, FACS Entered By: Christin Fudge on 02/25/2016 13:25:00 AMEET, Krueger ([id number]) -------------------------------------------------------------------------------- HPI Details Patient Name: Willie Krueger, Willie Krueger. Date of Service: 02/25/2016 12:45 PM Medical Record Number: [id number] Patient Account Number: [id number] Date of Birth/Sex: 1956-11-10 (60 y.o. Male) Treating RN: Macarthur Critchley Primary Care Physician: Juanell Fairly Other Clinician: Referring Physician: Juanell Fairly Treating Physician/Extender: Frann Rider in Treatment: 10 History of Present Illness Location: ulcerated area on his right big toe Quality: Patient reports No Pain. Severity: Patient states wound are getting worse. Duration: Patient has had the wound for > 3 months prior to seeking treatment at the wound center Timing: Pain in wound is Intermittent (comes and goes Context: The wound appeared gradually over time Modifying Factors: Consults to this date include: infectious disease who has put him on IV antibiotics with a PICC line Associated Signs and Symptoms: Patient reports having difficulty standing for long periods. HPI Description: 59 year old gentleman with multiple medical problems with a past medical history of diabetes  mellitus, hypertension, neuropathy, chronic pain and nicotine abuse. he was recently seen by Dr. Vincenza Hews of infectious disease at [facility] for a osteomyelitis of the distal phalanx of his right great toe. He also had diffuse cellulitis of the right lower leg. He has been advised to have a PICC line and will receive IV vancomycin and oral ciprofloxacin plus oral metronidazole. he had an MRI which was showing edema of the distal phalanx with probably represent osteomyelitis. He smokes a pack and a half of cigarettes a day. his last hemoglobin A1c was 8% A venous duplex of the right lower extremity was done recently on 11/30/2015 and this showed no evidence of DVT, superficial thrombophlebitis or incompetence. 01/11/2016 -- the patient only comes here once every 2 weeks and says he has several appointments and other issues which prevented him from coming for wound care. He has declined hyperbaric oxygen therapy, declined to stop smoking and has declined to see a surgeon regarding options for his toe. 02/18/2016 -- the patient has not seen [place] for the last 5 weeks and has several excuses for not being here. In the meanwhile he says his PICC line was removed and he is on no antibiotics and his ID specialist Dr. Vincenza Hews told him that his infection was all gone. The patient continues to smoke, says his diabetes is under good control and he has not been doing any specific dressing to his right big toe. I have reviewed Dr. Hale Bogus note from 01/23/2016 -- he had completed 45 days of IV antibiotic with vancomycin, oral ciprofloxacin and oral metronidazole for the right great toe osteomyelitis. He thought that clinically the osteomyelitis has been cured and he stopped his IV antibiotics and had the PICC line removed. Follow-up in 6 weeks was recommended. I have just spoken to Dr. Megan Salon just now and he will call for the patient to review the cultures from today and put him on appropriate  antibiotic. 02/25/2016 -- tissue culture report grew heavy growth of Escherichia coli which is sensitive to Kefzol and, cefepime, ceftriaxone, gentamicin, imipenem, Bactrim and Zosyn. I had spoken to Dr. Megan Salon over the phone and discussed the report with him and he is going to call the patient and put him on an appropriate antibiotic. I have reviewed Dr. Hale Bogus note on the electronic medical record -- foot cultures have grown Secor ([id number]) Escherichia coli and Enterococcus casselflavus. He decide to treat him with cefadroxil and ampicillin sulbactam. Electronic Signature(s) Signed: 02/25/2016 1:25:09 PM By: Christin Fudge MD, FACS Previous Signature: 02/25/2016 1:24:45 PM Version By: Christin Fudge MD, FACS Previous Signature: 02/25/2016 1:01:22 PM Version By: Christin Fudge MD, FACS Entered By: Christin Fudge on 02/25/2016 13:25:09 Willie Krueger, Willie Krueger ([id number]) -------------------------------------------------------------------------------- Physical Exam Details Patient Name: Willie Krueger. Date of Service: 02/25/2016 12:45 PM Medical Record Number: [id number] Patient Account Number: [id number] Date of Birth/Sex: [birth year] (60 y.o. Male) Treating RN: Macarthur Critchley Primary Care Physician: Juanell Fairly Other Clinician: Referring Physician: Juanell Fairly Treating Physician/Extender: Frann Rider in Treatment: 10 Constitutional . Pulse regular. Respirations normal and unlabored. Afebrile. . Eyes Nonicteric. Reactive to light. Ears, Nose, Mouth, and Throat Lips, teeth, and gums WNL.Marland Kitchen Moist mucosa without lesions. Neck supple and nontender. No palpable supraclavicular or cervical adenopathy. Normal sized without goiter. Respiratory WNL. No retractions.. Cardiovascular Pedal Pulses WNL. No clubbing, cyanosis or edema. Lymphatic No adneopathy. No adenopathy. No adenopathy. Musculoskeletal Adexa without tenderness or enlargement.. Digits and nails w/o clubbing,  cyanosis, infection, petechiae, ischemia, or inflammatory conditions.. Integumentary (Hair, Skin) No suspicious lesions. No crepitus or fluctuance. No peri-wound warmth or erythema. No masses.Marland Kitchen Psychiatric Judgement and insight Intact.. No evidence of depression, anxiety, or agitation.. Notes the right to did not need any debridement today and the opening on the plantar aspect is about 0.5 cm deep and probes down to bone. There is no evidence of cellulitis or purulent discharge today. Electronic Signature(s) Signed: 02/25/2016 1:25:53 PM By: Christin Fudge MD, FACS Entered By: Christin Fudge on 02/25/2016 13:25:53 Willie Krueger, Willie Krueger ([id number]) -------------------------------------------------------------------------------- Physician Orders Details Patient Name: Willie Krueger, Willie Krueger. Date of Service: 02/25/2016 12:45 PM Medical Record Number: [id number] Patient Account Number: [id number] Date of Birth/Sex: 08-11-1956 (60 y.o. Male) Treating RN: Macarthur Critchley Primary Care Physician: Juanell Fairly Other Clinician: Referring Physician: Juanell Fairly Treating Physician/Extender: Frann Rider in Treatment: 10 Verbal / Phone Orders: Yes Clinician: Macarthur Critchley Read Back and Verified: Yes Diagnosis Coding Wound Cleansing Wound #1 Right Toe Great o Cleanse wound with mild soap and water o May Shower, gently pat wound dry prior to applying new dressing. Primary Wound Dressing o Aquacel Ag Secondary Dressing Wound #1 Right Toe Great o Gauze and Kerlix/Conform Dressing Change Frequency Wound #1 Right Toe Great o Change dressing every other day. Follow-up Appointments Wound #1 Right Toe Great o Return Appointment in 1 week. Off-Loading Wound #1 Right Toe Great o Other: - darco shoe Additional Orders / Instructions Wound #1 Right Toe Great o Stop Smoking Radiology o X-ray, foot Electronic Signature(s) Signed: 02/25/2016 4:13:06 PM By: Ardean Larsen Signed: 02/25/2016 4:45:18 PM By: Christin Fudge MD, FACS Willie Krueger, Willie Krueger ([id number]) Entered By: [name] RN, [name] on 02/25/2016 13:07:07 Willie Krueger, Willie Krueger ([id number]) -------------------------------------------------------------------------------- Problem List Details Patient Name: Willie Krueger, Willie Krueger. Date of Service: 02/25/2016 12:45 PM Medical Record Number: [id number] Patient Account Number: [id number] Date of Birth/Sex: Jan 07, 1956 (60 y.o. 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 Date of Service: 02/25/2016 12:45 PM Medical Record Number: [id number] Patient Account Number: [id number] Date of Birth/Sex: 15-Oct-1956 (60 y.o. Male) Treating RN: Macarthur Critchley Primary Care Physician: Juanell Fairly Other Clinician: Referring Physician: Juanell Fairly Treating Physician/Extender: Frann Rider in Treatment: 10 Subjective Chief Complaint Information obtained from Patient Patients presents for treatment of an open diabetic ulcer to his right big toe which she's  had for several months History of Present Illness (HPI) The following HPI elements were documented for the patient's wound: Location: ulcerated area on his right big toe Quality: Patient reports No Pain. Severity: Patient states wound are getting worse. Duration: Patient has had the wound for > 3 months prior to seeking treatment at the wound center Timing: Pain in wound is Intermittent (comes and goes Context: The wound appeared gradually over time Modifying Factors: Consults to this date include: infectious disease who has put him on IV antibiotics with a PICC line Associated Signs and Symptoms: Patient reports having difficulty standing for long periods. 60 year old gentleman with multiple medical problems with a past medical history of diabetes mellitus, hypertension, neuropathy, chronic pain and nicotine abuse. he was recently seen by Dr. Vincenza Hews of infectious disease at [facility] for a osteomyelitis of the distal phalanx of his right great toe. He also had diffuse cellulitis of the right lower leg. He has been advised to have a PICC line and will receive IV vancomycin and oral ciprofloxacin plus oral metronidazole. he had an MRI which was showing edema of the distal phalanx with probably represent osteomyelitis. He smokes a pack and a half of cigarettes a day. his last hemoglobin A1c was 8% A venous duplex of the right lower extremity was done recently on 11/30/2015 and this showed no evidence of DVT, superficial thrombophlebitis or incompetence. 01/11/2016 -- the patient only comes here once every 2 weeks and says he has several appointments and other issues which prevented him from coming for wound care. He has declined hyperbaric oxygen therapy, declined to stop smoking and has declined to see a surgeon regarding options for his toe. 02/18/2016 -- the patient has not seen [place] for the last 5 weeks and has several excuses for not being here. In the meanwhile he says his PICC  line was removed and he is on no antibiotics and his ID specialist Dr. Vincenza Hews told him that his infection was all gone. The patient continues to smoke, says his diabetes is under good control and he has not been doing any specific dressing to his right big toe. I have reviewed Dr. Hale Bogus note from 01/23/2016 -- he had completed 45 days of IV antibiotic with vancomycin, oral ciprofloxacin and oral metronidazole for the right great toe osteomyelitis. Willie Krueger, Willie Krueger [place]. ([id number]) He thought that clinically the osteomyelitis has been cured and he stopped his IV antibiotics and had the PICC line removed. Follow-up  in 6 weeks was recommended. I have just spoken to Dr. Megan Salon just now and he will call for the patient to review the cultures from today and put him on appropriate antibiotic. 02/25/2016 -- tissue culture report grew heavy growth of Escherichia coli which is sensitive to Kefzol and, cefepime, ceftriaxone, gentamicin, imipenem, Bactrim and Zosyn. I had spoken to Dr. Megan Salon over the phone and discussed the report with him and he is going to call the patient and put him on an appropriate antibiotic. I have reviewed Dr. Hale Bogus note on the electronic medical record -- foot cultures have grown Escherichia coli and Enterococcus casselflavus. He decide to treat him with cefadroxil and ampicillin sulbactam. Objective Constitutional Pulse regular. Respirations normal and unlabored. Afebrile. Vitals Time Taken: 12:50 PM, Height: 74 in, Weight: 242 lbs, BMI: 31.1, Temperature: 98.1 F, Pulse: 84 bpm, Blood Pressure: 141/77 mmHg. Eyes Nonicteric. Reactive to light. Ears, Nose, Mouth, and Throat Lips, teeth, and gums WNL.Marland Kitchen Moist mucosa without lesions. Neck supple and nontender. No palpable supraclavicular or cervical adenopathy. Normal sized without goiter. Respiratory WNL. No retractions.. Cardiovascular Pedal Pulses WNL. No clubbing, cyanosis or edema. Lymphatic No  adneopathy. No adenopathy. No adenopathy. Musculoskeletal Adexa without tenderness or enlargement.. Digits and nails w/o clubbing, cyanosis, infection, petechiae, ischemia, or inflammatory conditions.Marland Kitchen Psychiatric Judgement and insight Intact.. No evidence of depression, anxiety, or agitation.Marland Kitchen Willie Krueger, Willie Krueger [place]. ([id number]) General Notes: the right to did not need any debridement today and the opening on the plantar aspect is about 0.5 cm deep and probes down to bone. There is no evidence of cellulitis or purulent discharge today. Integumentary (Hair, Skin) No suspicious lesions. No crepitus or fluctuance. No peri-wound warmth or erythema. No masses.. Wound #1 status is Open. Original cause of wound was Gradually Appeared. The wound is located on the Right Toe Great. The wound measures 0.7cm length x 0.8cm width x 0.5cm depth; 0.44cm^2 area and 0.22cm^3 volume. 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 I have reviewed Dr. Hale Bogus notes and note the antibiotic he has put them on. Patient may also need an opinion from Dr. Celesta Gentile was seen the patient earlier and at this stage may need to do a operative debridement. The patient continued to smoke and he is not willing to take hyperbaric oxygen therapy. He will come back and see me next week Plan Wound Cleansing: Wound #1 Right Toe Great: Cleanse wound with mild soap and water May Shower, gently pat wound dry prior to applying new dressing. Willie Krueger, Willie Krueger [place] ([id number]) Primary Wound Dressing: Aquacel Ag Secondary  Dressing: Wound #1 Right Toe Great: Gauze and Kerlix/Conform Dressing Change Frequency: Wound #1 Right Toe Great: Change dressing every other day. Follow-up Appointments: Wound #1 Right Toe Great: Return Appointment in 1 week. Off-Loading: Wound #1 Right Toe Great: Other: - darco shoe Additional Orders / Instructions: Wound #1 Right Toe Great: Stop Smoking Radiology ordered were: X-ray, foot I have recommended an x-ray of the right foot to be done and if there is further need, he may need to do an MRI. I have reviewed Dr. Hale Bogus notes and  note the antibiotic he has put them on. Patient may also need an opinion from Dr. Celesta Gentile was seen the patient earlier and at this stage may need to do a operative debridement. The patient continued to smoke and he is not willing to take hyperbaric oxygen therapy. He will come back and see me next week Electronic Signature(s) Signed: 02/25/2016 1:28:17 PM By: Christin Fudge MD, FACS Entered By: Christin Fudge on 02/25/2016 13:28:17 BUREN, GRAHMANN ([id number]) -------------------------------------------------------------------------------- SuperBill Details Patient Name: TAYON, NIBERT. Date of Service: 02/25/2016 Medical Record Number: [id number] Patient Account Number: [id number] Date of Birth/Sex: 12/04/1956 (60 y.o. Male) Treating RN: Macarthur Critchley Primary Care Physician: Juanell Fairly Other Clinician: Referring Physician: Juanell Fairly Treating Physician/Extender: Frann Rider in Treatment: 10 Diagnosis Coding ICD-10 Codes Code Description [id number] Type 2 diabetes mellitus with foot ulcer L97.514 Non-pressure chronic ulcer of other part of right foot with necrosis of bone M86.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, right ankle and foot F17.218 Nicotine dependence, cigarettes, with other nicotine-induced disorders L84 Corns and callosities Facility Procedures CPT4 Code: [id number] Description: 99213 - WOUND CARE  VISIT-LEV 3 EST PT Modifier: Quantity: 1 Physician Procedures CPT4 Code Description: [id number] - WC PHYS LEVEL 3 - EST PT ICD-10 Description Diagnosis E11.621 Type 2 diabetes mellitus with foot ulcer L97.514 Non-pressure chronic ulcer of other part of right f M86.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, right  ankle and f F17.218 Nicotine dependence, cigarettes, with other nicotin Modifier: oot with necros oot e-induced disor Quantity: 1 is of bone ders Electronic Signature(s) Signed: 02/25/2016 4:13:06 PM By: Ardean Larsen Signed: 02/25/2016 4:45:18 PM By: Christin Fudge MD, FACS Previous Signature: 02/25/2016 1:28:32 PM Version By: Christin Fudge MD, FACS Entered By: [name] RN, [name] on 02/25/2016 13:35:32

## 2016-02-25 NOTE — Progress Notes (Signed)
ROMELIO, KLEIN ([id number]) Visit Report for 02/25/2016 Arrival Information Details Patient Name: Willie Krueger, Willie Krueger. Date of Service: 02/25/2016 12:45 PM Medical Record Number: [id number] Patient Account Number: [id number] Date of Birth/Sex: 21-May-1956 (60 y.o. Male) Treating RN: Macarthur Critchley Primary Care Physician: Juanell Fairly Other Clinician: Referring Physician: Juanell Fairly Treating Physician/Extender: Frann Rider in Treatment: 10 Visit Information History Since Last Visit All ordered tests and consults were completed: No Patient Arrived: Ambulatory Added or deleted any medications: No Arrival Time: 12:52 Any new allergies or adverse reactions: No Accompanied By: self Had a fall or experienced change in No Transfer Assistance: None activities of daily living that may affect Patient Identification Verified: Yes risk of falls: Secondary Verification Process Yes Signs or symptoms of abuse/neglect since last No Completed: visito Patient Requires Transmission- No Hospitalized since last visit: No Based Precautions: Has Dressing in Place as Prescribed: Yes Patient Has Alerts: Yes Pain Present Now: No Patient Alerts: Patient on Blood Thinner Aspirin, Dm2 Electronic Signature(s) Signed: 02/25/2016 4:13:06 PM By: [name], RN, [name] Entered By: [name] RN, [name] on 02/25/2016 12:52:57 Willie Krueger ([id number]) -------------------------------------------------------------------------------- Clinic Level of Care Assessment Details Patient Name: Willie Krueger, Willie Krueger. Date of Service: 02/25/2016 12:45 PM Medical Record Number: [id number] Patient Account Number: [id number] Date of Birth/Sex: 09/07/1956 (60 y.o. Male) Treating RN: Macarthur Critchley Primary Care Physician: Juanell Fairly Other Clinician: Referring Physician: Juanell Fairly Treating Physician/Extender: Frann Rider in Treatment: 10 Clinic Level of Care Assessment Items TOOL 4 Quantity Score X - Use when  only an EandM is performed on FOLLOW-UP visit 1 0 ASSESSMENTS - Nursing Assessment / Reassessment X - Reassessment of Co-morbidities (includes updates in patient status) 1 10 X - Reassessment of Adherence to Treatment Plan 1 5 ASSESSMENTS - Wound and Skin Assessment / Reassessment X - Simple Wound Assessment / Reassessment - one wound 1 5 []  - Complex Wound Assessment / Reassessment - multiple wounds 0 []  - Dermatologic / Skin Assessment (not related to wound area) 0 ASSESSMENTS - Focused Assessment []  - Circumferential Edema Measurements - multi extremities 0 []  - Nutritional Assessment / Counseling / Intervention 0 X - Lower Extremity Assessment (monofilament, tuning fork, pulses) 1 5 []  - Peripheral Arterial Disease Assessment (using hand held doppler) 0 ASSESSMENTS - Ostomy and/or Continence Assessment and Care []  - Incontinence Assessment and Management 0 []  - Ostomy Care Assessment and Management (repouching, etc.) 0 PROCESS - Coordination of Care X - Simple Patient / Family Education for ongoing care 1 15 []  - Complex (extensive) Patient / Family Education for ongoing care 0 X - Staff obtains Programmer, systems, Records, Test Results / Process Orders 1 10 []  - Staff telephones HHA, Nursing Homes / Clarify orders / etc 0 []  - Routine Transfer to another Facility (non-emergent condition) 0 Willie Krueger, Willie Krueger ([id number]) []  - Routine Hospital Admission (non-emergent condition) 0 []  - New Admissions / Biomedical engineer / Ordering NPWT, Apligraf, etc. 0 []  - Emergency Hospital Admission (emergent condition) 0 X - Simple Discharge Coordination 1 10 []  - Complex (extensive) Discharge Coordination 0 PROCESS - Special Needs []  - Pediatric / Minor Patient Management 0 []  - Isolation Patient Management 0 []  - Hearing / Language / Visual special needs 0 []  - Assessment of Community assistance (transportation, D/C planning, etc.) 0 []  - Additional assistance / Altered mentation 0 []  - Support  Surface(s) Assessment (bed, cushion, seat, etc.) 0 INTERVENTIONS - Wound Cleansing / Measurement X - Simple Wound Cleansing - one wound 1 5 []  -  Complex Wound Cleansing - multiple wounds 0 X - Wound Imaging (photographs - any number of wounds) 1 5 []  - Wound Tracing (instead of photographs) 0 X - Simple Wound Measurement - one wound 1 5 []  - Complex Wound Measurement - multiple wounds 0 INTERVENTIONS - Wound Dressings X - Small Wound Dressing one or multiple wounds 1 10 []  - Medium Wound Dressing one or multiple wounds 0 []  - Large Wound Dressing one or multiple wounds 0 []  - Application of Medications - topical 0 []  - Application of Medications - injection 0 INTERVENTIONS - Miscellaneous []  - External ear exam 0 Willie Krueger, Willie Krueger ([id number]) []  - Specimen Collection (cultures, biopsies, blood, body fluids, etc.) 0 []  - Specimen(s) / Culture(s) sent or taken to Lab for analysis 0 []  - Patient Transfer (multiple staff / Harrel Lemon Lift / Similar devices) 0 []  - Simple Staple / Suture removal (25 or less) 0 []  - Complex Staple / Suture removal (26 or more) 0 []  - Hypo / Hyperglycemic Management (close monitor of Blood Glucose) 0 []  - Ankle / Brachial Index (ABI) - do not check if billed separately 0 X - Vital Signs 1 5 Has the patient been seen at the hospital within the last three years: Yes Total Score: 90 Level Of Care: New/Established - Level 3 Electronic Signature(s) Signed: 02/25/2016 4:13:06 PM By: [name], RN, [name] Entered By: [name] RN, [name] on 02/25/2016 13:35:17 Willie Krueger ([id number]) -------------------------------------------------------------------------------- Encounter Discharge Information Details Patient Name: Willie Krueger, Willie Krueger. Date of Service: 02/25/2016 12:45 PM Medical Record Number: [id number] Patient Account Number: [id number] Date of Birth/Sex: [birth year] (60 y.o. Male) Treating RN: Macarthur Critchley Primary Care Physician: Juanell Fairly Other  Clinician: Referring Physician: Juanell Fairly Treating Physician/Extender: Frann Rider in Treatment: 10 Encounter Discharge Information Items Discharge Pain Level: 0 Discharge Condition: Stable Ambulatory Status: Ambulatory Discharge Destination: Home Transportation: Other Accompanied By: self Schedule Follow-up Appointment: Yes Medication Reconciliation completed and provided to Patient/Care Yes Tammy Wickliffe: Provided on Clinical Summary of Care: 02/25/2016 Form Type Recipient Paper Patient GM Electronic Signature(s) Signed: 02/25/2016 1:18:11 PM By: Ruthine Dose Entered By: Ruthine Dose on 02/25/2016 13:18:11 Willie Krueger ([id number]) -------------------------------------------------------------------------------- Lower Extremity Assessment Details Patient Name: Willie Krueger, Willie Krueger. Date of Service: 02/25/2016 12:45 PM Medical Record Number: [id number] Patient Account Number: [id number] Date of Birth/Sex: 25-Jan-1956 (60 y.o. Male) Treating RN: Macarthur Critchley Primary Care Physician: Juanell Fairly Other Clinician: Referring Physician: Juanell Fairly Treating Physician/Extender: Frann Rider in Treatment: 10 Edema Assessment Assessed: [name]: No] [Right: No] Edema: [Left: Ye] [Right: s] Vascular Assessment Pulses: Posterior Tibial Dorsalis Pedis Palpable: [Right:Yes] Extremity colors, hair growth, and conditions: Extremity Color: [Right:Normal] Hair Growth on Extremity: [Right:Yes] Temperature of Extremity: [Right:Warm] Capillary Refill: [Right:< 3 seconds] Toe Nail Assessment Left: Right: Thick: No Discolored: No Deformed: No Improper Length and Hygiene: No Electronic Signature(s) Signed: 02/25/2016 4:13:06 PM By: [name], RN, [name] Entered By: [name] RN, [name] on 02/25/2016 12:58:27 FINES, SUHRE ([id number]) -------------------------------------------------------------------------------- Pain Assessment Details Patient Name: Willie Krueger, Willie Krueger. Date of Service: 02/25/2016 12:45 PM Medical Record Number: [id number] Patient Account Number: [id number] Date of Birth/Sex: [birth year] (60 y.o. Male) Treating RN: Macarthur Critchley Primary Care Physician: Juanell Fairly Other Clinician: Referring Physician: Juanell Fairly Treating Physician/Extender: Frann Rider in Treatment: 10 Active Problems Location of Pain Severity and Description of Pain Patient Has Paino Yes Site Locations Pain Location: Pain in Ulcers With Dressing Change: Yes Duration of the Pain. Constant / Intermittento Constant Rate the pain. Current Pain Level:  4 Pain Management and Medication Current Pain Management: Electronic Signature(s) Signed: 02/25/2016 4:13:06 PM By: [name], RN, [name] Entered By: [name] RN, [name] on 02/25/2016 12:53:12 Willie Krueger, Willie Krueger ([id number]) -------------------------------------------------------------------------------- Patient/Caregiver Education Details Patient Name: Willie Krueger, Willie Krueger. Date of Service: 02/25/2016 12:45 PM Medical Record Number: [id number] Patient Account Number: [id number] Date of Birth/Gender: 04/19/1956 (60 y.o. Male) Treating RN: Macarthur Critchley Primary Care Physician: Juanell Fairly Other Clinician: Referring Physician: Juanell Fairly Treating Physician/Extender: Frann Rider in Treatment: 10 Education Assessment Education Provided To: Patient Education Topics Provided Wound/Skin Impairment: Handouts: Caring for Your Ulcer, Skin Care Do's and Dont's Methods: Explain/Verbal Responses: State content correctly Electronic Signature(s) Signed: 02/25/2016 4:13:06 PM By: [name], RN, [name] Entered By: [name] RN, [name] on 02/25/2016 13:08:04 Willie Krueger, Willie Krueger ([id number]) -------------------------------------------------------------------------------- Wound Assessment Details Patient Name: Willie Krueger, STANTZ. Date of Service: 02/25/2016 12:45 PM Medical Record Number: [id number] Patient Account  Number: [id number] Date of Birth/Sex: 12/12/1956 (60 y.o. Male) Treating RN: Macarthur Critchley Primary Care Physician: Juanell Fairly Other Clinician: Referring Physician: Juanell Fairly Treating Physician/Extender: Frann Rider in Treatment: 10 Wound Status Wound Number: 1 Primary Diabetic Wound/Ulcer of the Lower Etiology: Extremity Wound Location: Right Toe Great Wound Status: Open Wounding Event: Gradually Appeared Date Acquired: 07/02/2015 Weeks Of Treatment: 10 Clustered Wound: No Photos Photo Uploaded By: [name], RN, [name] on 02/25/2016 15:51:25 Wound Measurements Length: (cm) 0.7 Width: (cm) 0.8 Depth: (cm) 0.5 Area: (cm) 0.44 Volume: (cm) 0.22 % Reduction in Area: 20% % Reduction in Volume: -300% Wound Description Classification: Grade 1 Periwound Skin Texture Texture Color No Abnormalities Noted: No No Abnormalities Noted: No Moisture No Abnormalities Noted: No Treatment Notes Wound #1 (Right Toe Great) 1. Cleansed with: KENG, ROMANOS ([id number]) Clean wound with Normal Saline 2. Anesthetic Topical Lidocaine 4% cream to wound bed prior to debridement 4. Dressing Applied: Aquacel Ag 5. Secondary Dressing Applied Gauze and Kerlix/Conform Notes xray right foot Electronic Signature(s) Signed: 02/25/2016 4:13:06 PM By: [name], RN, [name] Entered By: [name] RN, [name] on 02/25/2016 12:58:50 JUSTON, RAIMONDO ([id number]) -------------------------------------------------------------------------------- Venturia Details Patient Name: TRANQUILINO, CIMINO. Date of Service: 02/25/2016 12:45 PM Medical Record Number: [id number] Patient Account Number: [id number] Date of Birth/Sex: November 08, 1956 (60 y.o. Male) Treating RN: Macarthur Critchley Primary Care Physician: Juanell Fairly Other Clinician: Referring Physician: Juanell Fairly Treating Physician/Extender: Frann Rider in Treatment: 10 Vital Signs Time Taken: 12:50 Temperature (F): 98.1 Height (in):  74 Pulse (bpm): 84 Weight (lbs): 242 Blood Pressure (mmHg): 141/77 Body Mass Index (BMI): 31.1 Reference Range: 80 - 120 mg / dl Electronic Signature(s) Signed: 02/25/2016 4:13:06 PM By: [name] RN, [name] Entered By: [name] RN, [name] on 02/25/2016 13:20:53

## 2016-02-27 ENCOUNTER — Encounter: Payer: Self-pay | Admitting: Pain Medicine

## 2016-02-27 ENCOUNTER — Ambulatory Visit
Admission: RE | Admit: 2016-02-27 | Discharge: 2016-02-27 | Disposition: A | Discharge status: Home / Self Care | Payer: 59 | Source: Ambulatory Visit | Attending: Surgery | Admitting: Surgery

## 2016-02-27 ENCOUNTER — Ambulatory Visit (HOSPITAL_BASED_OUTPATIENT_CLINIC_OR_DEPARTMENT_OTHER): Payer: 59 | Admitting: Pain Medicine

## 2016-02-27 ENCOUNTER — Other Ambulatory Visit: Payer: Self-pay | Admitting: Surgery

## 2016-02-27 VITALS — BP 112/63 | HR 84 | Temp 98.4°F | Resp 18 | Ht 74.0 in | Wt 242.0 lb

## 2016-02-27 DIAGNOSIS — N529 Male erectile dysfunction, unspecified: Secondary | ICD-10-CM | POA: Insufficient documentation

## 2016-02-27 DIAGNOSIS — G8929 Other chronic pain: Secondary | ICD-10-CM | POA: Insufficient documentation

## 2016-02-27 DIAGNOSIS — E1140 Type 2 diabetes mellitus with diabetic neuropathy, unspecified: Secondary | ICD-10-CM | POA: Diagnosis not present

## 2016-02-27 DIAGNOSIS — I10 Essential (primary) hypertension: Secondary | ICD-10-CM | POA: Insufficient documentation

## 2016-02-27 DIAGNOSIS — M792 Neuralgia and neuritis, unspecified: Secondary | ICD-10-CM

## 2016-02-27 DIAGNOSIS — E785 Hyperlipidemia, unspecified: Secondary | ICD-10-CM | POA: Diagnosis not present

## 2016-02-27 DIAGNOSIS — M5416 Radiculopathy, lumbar region: Secondary | ICD-10-CM | POA: Insufficient documentation

## 2016-02-27 DIAGNOSIS — Z5181 Encounter for therapeutic drug level monitoring: Secondary | ICD-10-CM | POA: Diagnosis not present

## 2016-02-27 DIAGNOSIS — M25512 Pain in left shoulder: Secondary | ICD-10-CM | POA: Diagnosis not present

## 2016-02-27 DIAGNOSIS — M539 Dorsopathy, unspecified: Secondary | ICD-10-CM

## 2016-02-27 DIAGNOSIS — E1169 Type 2 diabetes mellitus with other specified complication: Secondary | ICD-10-CM | POA: Insufficient documentation

## 2016-02-27 DIAGNOSIS — L97519 Non-pressure chronic ulcer of other part of right foot with unspecified severity: Secondary | ICD-10-CM | POA: Diagnosis present

## 2016-02-27 DIAGNOSIS — B182 Chronic viral hepatitis C: Secondary | ICD-10-CM | POA: Insufficient documentation

## 2016-02-27 DIAGNOSIS — L98499 Non-pressure chronic ulcer of skin of other sites with unspecified severity: Secondary | ICD-10-CM

## 2016-02-27 DIAGNOSIS — M47812 Spondylosis without myelopathy or radiculopathy, cervical region: Secondary | ICD-10-CM | POA: Insufficient documentation

## 2016-02-27 DIAGNOSIS — Z79891 Long term (current) use of opiate analgesic: Secondary | ICD-10-CM | POA: Insufficient documentation

## 2016-02-27 DIAGNOSIS — E11622 Type 2 diabetes mellitus with other skin ulcer: Secondary | ICD-10-CM

## 2016-02-27 DIAGNOSIS — M961 Postlaminectomy syndrome, not elsewhere classified: Secondary | ICD-10-CM

## 2016-02-27 DIAGNOSIS — M25511 Pain in right shoulder: Secondary | ICD-10-CM

## 2016-02-27 DIAGNOSIS — R609 Edema, unspecified: Secondary | ICD-10-CM

## 2016-02-27 DIAGNOSIS — E1142 Type 2 diabetes mellitus with diabetic polyneuropathy: Secondary | ICD-10-CM

## 2016-02-27 DIAGNOSIS — F1190 Opioid use, unspecified, uncomplicated: Secondary | ICD-10-CM

## 2016-02-27 DIAGNOSIS — F17210 Nicotine dependence, cigarettes, uncomplicated: Secondary | ICD-10-CM | POA: Insufficient documentation

## 2016-02-27 LAB — COMPREHENSIVE METABOLIC PANEL
ALBUMIN: 3.9 g/dL (ref 3.5–5.0)
ALK PHOS: 85 U/L (ref 38–126)
ALT: 37 U/L (ref 17–63)
ANION GAP: 8 (ref 5–15)
AST: 30 U/L (ref 15–41)
BILIRUBIN TOTAL: 0.7 mg/dL (ref 0.3–1.2)
BUN: 20 mg/dL (ref 6–20)
CHLORIDE: 104 mmol/L (ref 101–111)
CO2: 24 mmol/L (ref 22–32)
Calcium: 9.5 mg/dL (ref 8.9–10.3)
Creatinine, Ser: 0.93 mg/dL (ref 0.61–1.24)
GFR calc non Af Amer: >60 mL/min (ref >60)
Glucose, Bld: 141 mg/dL — ABNORMAL HIGH (ref 65–99)
Potassium: 4.4 mmol/L (ref 3.5–5.1)
Sodium: 136 mmol/L (ref 135–145)
Total Protein: 7.9 g/dL (ref 6.5–8.1)

## 2016-02-27 LAB — SEDIMENTATION RATE: SED RATE: 9 mm/h (ref 0–20)

## 2016-02-27 LAB — C-REACTIVE PROTEIN: CRP: 1.8 mg/dL — ABNORMAL HIGH (ref <1.0)

## 2016-02-27 LAB — MAGNESIUM: MAGNESIUM: 1.9 mg/dL (ref 1.7–2.4)

## 2016-02-27 MED ORDER — GABAPENTIN 600 MG PO TABS: 1200.0000 mg | ORAL_TABLET | Freq: Four times a day (QID) | ORAL | Status: DC | PRN

## 2016-02-27 MED ORDER — OXYCODONE HCL 15 MG PO TABS
15.0000 mg | ORAL_TABLET | Freq: Every day | ORAL | Status: DC | PRN
Start: 2016-02-27 — End: 2016-03-04

## 2016-02-27 MED ORDER — OXYCODONE HCL 15 MG PO TABS: 15.0000 mg | ORAL_TABLET | Freq: Every day | ORAL | Status: DC | PRN

## 2016-02-27 MED ORDER — NALOXONE HCL 4 MG/0.1ML NA LIQD: 1.0000 | NASAL | Status: DC | PRN

## 2016-02-27 NOTE — Progress Notes (Signed)
Safety precautions to be maintained throughout the outpatient stay will include: orient to surroundings, keep bed in low position, maintain call bell within reach at all times, provide assistance with transfer out of bed and ambulation. Oxycodone pill coount # 28/150  Filled 01-29-16

## 2016-02-27 NOTE — Progress Notes (Signed)
Patient's Name: Willie Krueger MRN: [id number] DOB: 08/25/1956 DOS: 02/27/2016  Primary Reason(s) for Visit: Encounter for Medication Management CC: Leg Pain; Back Pain; Shoulder Pain; and Hand Pain   HPI  Willie Krueger is a 60 y.o. year old, male patient, who returns today as an established patient. He has Toe ulcer ([place]); Cervical spinal cord compression ([place]); Cardiac murmur; Chronic hepatitis C virus infection ([place]); HLD (hyperlipidemia); Benign hypertension; ED (erectile dysfunction) of organic origin; MI (mitral incompetence); Adiposity; Current smoker; History of decompression of median nerve; Chronic pain; Long term current use of opiate analgesic; Long term prescription opiate use; Opiate use (112.5 MME/Day); Opiate dependence ([place]); Encounter for therapeutic drug level monitoring; Chronic neck pain (Right side); Cervical spondylosis; Cervical post-laminectomy syndrome; Chronic low back pain (Location of Primary Source of Pain) (Midline pain) (R>L); Chronic cervical radicular pain (Bilateral) (R>L); Chronic lumbar radicular pain (Bilateral) (R>L); Substance use disorder Risk: LOW; Failed back surgical syndrome; Chronic lower extremity pain (Location of Secondary source of pain) (Bilateral) (R>L); History of carpal tunnel release of both wrists; Grade I anterolisthesis of C7 on T1; Encounter for chronic pain management; Elevated C-reactive protein (CRP); Diabetic osteomyelitis ([place]); Neuropathy ([place]); Obesity; Neurogenic pain; Type 2 diabetes mellitus with ulcer ([place]); Diabetic peripheral neuropathy ([facility]); and Chronic shoulder pain (Location of Tertiary source of pain) (Bilateral) (R>L) on his problem list.. His primarily concern today is the Leg Pain; Back Pain; Shoulder Pain; and Hand Pain   The patient comes in today clinics today for pharmacological management of his chronic pain. The patient describes the primary pain is being that of the lower back with most of the pain on the right side. He  indicates having had a cyst removed in that area by Dr. Wynn Banker at [facility] neurosurgery. This second source of pain is that of the lower extremities with the right side being worst on the left. In the case of the right lower extremity the pain goes through the anterior aspect of his thigh to the knee. He indicates feeling weakness in this area. Following this is the pain in the left lower extremity where the pain actually goes all the way down into his foot to the top and inner portion of the foot in what appears to be an L4/L5 dermatomal distribution. The patient denies any numbness on the left lower extremity. Next is the shoulder pain where he indicates is worse on the right side. Following this he indicates having pain in both hands were he had the carpal tunnel release. He indicates that the right hand is worse than the left.  Today we provided the patient with a copy of our medication policy and we took over his gabapentin, which he asked [place] to do. We have made it very clear to him that once we start prescribing the pain medication (opioids) then he should not get up from anybody else.  Pain Assessment: Self-Reported Pain Score: 7 , clinically he looks like a 2-3/10. Reported level is compatible with observation Pain Type: Chronic pain Pain Location: Leg Pain Orientation: Right, Left (thighs) Pain Descriptors / Indicators: Aching Pain Frequency: Constant  Date of Last Visit: 12/03/15 Service Provided on Last Visit: Med Refill  Controlled Substance Pharmacotherapy Assessment  Analgesic: Oxycodone IR 15 mg 5 times a day (75 mg/day) MME/day: 112.5 mg/day Pharmacokinetics: Onset of action (Liberation/Absorption): Within expected pharmacological parameters. (30 minutes) Time to Peak effect (Distribution): Timing and results are as within normal expected parameters. (2 hours) Duration of action (Metabolism/Excretion): Within normal limits  for medication. (4 hours) Pharmacodynamics: Analgesic  Effect: 75% Activity Facilitation: Medication(s) allow patient to sit, stand, walk, and do the basic ADLs Perceived Effectiveness: Described as relatively effective, allowing for increase in activities of daily living (ADL) Side-effects or Adverse reactions: None reported Monitoring: [place] PMP: Compliant with practice rules and regulations UDS Results/interpretation: Last UDS done on 12/03/2015 came back within normal limits with no unexpected results. Medication Assessment Form: Reviewed. Patient indicates being compliant with therapy Treatment compliance: Compliant Risk Assessment: Aberrant Behavior: None observed today. Substance Use Disorder (SUD) Risk Level: Low Opioid Risk Tool (ORT) Score: Total Score: 0 Low Risk for SUD (Score <3) Depression Scale Score:    Pharmacologic Plan: Continue therapy as is  Lab Work: Illicit Drugs No results found for: THCU, COCAINSCRNUR, PCPSCRNUR, MDMA, AMPHETMU, METHADONE, ETOH  Inflammation Markers Lab Results  Component Value Date   ESRSEDRATE 9 02/27/2016   CRP 1.8* 02/27/2016    Renal Function Lab Results  Component Value Date   BUN 20 02/27/2016   CREATININE 0.93 02/27/2016   GFRAA >60 02/27/2016   GFRNONAA >60 02/27/2016    Hepatic Function Lab Results  Component Value Date   AST 30 02/27/2016   ALT 37 02/27/2016   ALBUMIN 3.9 02/27/2016    Electrolytes Lab Results  Component Value Date   NA 136 02/27/2016   K 4.4 02/27/2016   CL 104 02/27/2016   CALCIUM 9.5 02/27/2016   MG 1.9 02/27/2016    Allergies  Willie Krueger is allergic to cleocin and sulfa antibiotics.  Meds  The patient has a current medication list which includes the following prescription(s): acetaminophen, amoxicillin-clavulanate, aspirin ec, cefadroxil, vitamin d3, vitamin d3, cvs d3, diphenhydramine-acetaminophen, furosemide, gabapentin, glimepiride, ketoconazole, lisinopril, metformin, metformin, metronidazole, oxycodone, pravastatin, sildenafil, naloxone  hcl, oxycodone, and oxycodone.  Current Outpatient Prescriptions on File Prior to Visit  Medication Sig  . acetaminophen (TYLENOL) 500 MG tablet Take 500 mg by mouth every 4 (four) hours. Scheduled (5 times daily)  . amoxicillin-clavulanate (AUGMENTIN) 875-125 MG tablet Take 1 tablet by mouth 2 (two) times daily.  Marland Kitchen aspirin EC 81 MG tablet Take 81 mg by mouth daily.  . cefadroxil (DURICEF) 500 MG capsule Take 1 capsule (500 mg total) by mouth 2 (two) times daily.  . Cholecalciferol (VITAMIN D3) 2000 UNITS TABS Take 2,000 Units by mouth daily.   . diphenhydramine-acetaminophen (TYLENOL PM) 25-500 MG TABS tablet Take 2 tablets by mouth at bedtime.  . furosemide (LASIX) 20 MG tablet Take 40 mg by mouth 2 (two) times daily.   Marland Kitchen glimepiride (AMARYL) 2 MG tablet Take 2 mg by mouth daily with breakfast.   . lisinopril (PRINIVIL,ZESTRIL) 20 MG tablet Take 20 mg by mouth at bedtime.   . metFORMIN (GLUCOPHAGE-XR) 500 MG 24 hr tablet Take 500 mg by mouth 2 (two) times daily.  . pravastatin (PRAVACHOL) 20 MG tablet Take 20 mg by mouth at bedtime.   . sildenafil (VIAGRA) 100 MG tablet Take 100 mg by mouth daily as needed for erectile dysfunction.   No current facility-administered medications on file prior to visit.    ROS  Constitutional: Afebrile, no chills, well hydrated and well nourished Gastrointestinal: negative Musculoskeletal:negative Neurological: negative Behavioral/Psych: negative  PFSH  Medical:  Willie Krueger  has a past medical history of Diabetes mellitus without complication ([place]); Hypertension; Chronic pain; Hyperlipidemia; Osteoarthritis; Heart murmur; Mitral valve regurgitation; History of spinal stenosis (10/31/2015); Leg weakness (07/07/2014); Nerve root inflammation (06/26/2014); and Chronic pain associated with significant psychosocial dysfunction (05/04/2015). Family:  family history includes Cancer in his mother; Diabetes in his mother; Heart disease in his father. Surgical:  has  past surgical history that includes Carpal tunnel release and Spine surgery. Tobacco:  reports that he has been smoking Cigarettes.  He has been smoking about 1.50 packs per day. He has never used smokeless tobacco. Alcohol:  reports that he does not drink alcohol. Drug:  reports that he does not use illicit drugs.  Physical Exam  Vitals:  Today's Vitals   02/27/16 1024 02/27/16 1026  BP:  112/63  Pulse: 84   Temp: 98.4 F (36.9 C)   Resp: 18   Height: 6\' 2"  (1.88 m)   Weight: 242 lb (109.77 kg)   SpO2: 98%   PainSc: 7  7   PainLoc: Leg     Calculated BMI: Body mass index is 31.06 kg/(m^2).  General appearance: alert, cooperative, appears stated age and no distress Eyes: PERLA Respiratory: No evidence respiratory distress, no audible rales or ronchi and no use of accessory muscles of respiration  Cervical Spine Inspection: Normal anatomy Alignment: Symetrical ROM: Adequate  Upper Extremities Inspection: No gross anomalies detected ROM: Adequate Sensory: Normal Motor: Unremarkable  Thoracic Spine Inspection: No gross anomalies detected Alignment: Symetrical ROM: Adequate  Lumbar Spine Inspection: No gross anomalies detected Alignment: Symetrical ROM: Adequate  Gait: WNL  Lower Extremities Inspection: No gross anomalies detected ROM: Adequate Sensory:  Normal Motor: Unremarkable  Assessment & Plan  Primary Diagnosis & Pertinent Problem List: The primary encounter diagnosis was Chronic pain. Diagnoses of Encounter for therapeutic drug level monitoring, Long term current use of opiate analgesic, Chronic lumbar radicular pain (Bilateral) (R>L), Failed back surgical syndrome, Opiate use (112.5 MME/Day), Neurogenic pain, Type 2 diabetes mellitus with ulcer ([place]), Diabetic peripheral neuropathy ([place]), and Chronic shoulder pain (Location of Tertiary source of pain) (Bilateral) (R>L) were also pertinent to this visit.  Visit Diagnosis: 1. Chronic pain   2.  Encounter for therapeutic drug level monitoring   3. Long term current use of opiate analgesic   4. Chronic lumbar radicular pain (Bilateral) (R>L)   5. Failed back surgical syndrome   6. Opiate use (112.5 MME/Day)   7. Neurogenic pain   8. 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 Diabetic peripheral neuropathy ([place])   10. Chronic shoulder pain (Location of Tertiary source of pain) (Bilateral) (R>L)     Problem-specific Plan(s): No problem-specific assessment & plan notes found for this encounter.   Plan of Care  Pharmacotherapy (Medications Ordered): Meds ordered this encounter  Medications  . oxyCODONE (ROXICODONE) 15 MG immediate release tablet    Sig: Take 1 tablet (15 mg total) by mouth 5 (five) times daily as needed for pain.    Dispense:  150 tablet    Refill:  0    Do not place this medication, or any other prescription from our practice, on "Automatic Refill". Patient may have prescription filled one day early if pharmacy is closed on scheduled refill date. Do not fill until: 03/03/16 To last until: 04/02/16  . oxyCODONE (ROXICODONE) 15 MG immediate release tablet    Sig: Take 1 tablet (15 mg total) by mouth 5 (five) times daily as needed for pain.    Dispense:  150 tablet    Refill:  0    Do not place this medication, or any other prescription from our practice, on "Automatic Refill". Patient may have prescription filled one day early if pharmacy is closed on scheduled refill date. Do  not fill until: 04/02/16 To last until: 05/02/16  . Naloxone HCl (NARCAN) 4 MG/0.1ML LIQD    Sig: Place 1 spray into the nose every 5 (five) minutes as needed (for pain medication overdose.). Spray half into each nostril, then call 911    Dispense:  2 each    Refill:  0    Please instruct the patient in proper use of medication.  Marland Kitchen oxyCODONE (ROXICODONE) 15 MG immediate release tablet    Sig: Take 1 tablet (15 mg total) by mouth 5 (five) times daily as needed for pain.     Dispense:  120 tablet    Refill:  0    Do not place this medication, or any other prescription from our practice, on "Automatic Refill". Patient may have prescription filled one day early if pharmacy is closed on scheduled refill date. Do not fill until: 05/02/16 To last until: 06/01/16  . gabapentin (NEURONTIN) 600 MG tablet    Sig: Take 2 tablets (1,200 mg total) by mouth every 6 (six) hours as needed.    Dispense:  240 tablet    Refill:  2    Do not place this medication, or any other prescription from our practice, on "Automatic Refill". Patient may have prescription filled one day early if pharmacy is closed on scheduled refill date.    Lab-work & Procedure Ordered: Orders Placed This Encounter  Procedures  . ToxASSURE Select 13 (MW), Urine    Volume: 30 ml(s). Minimum 3 ml of urine is needed. Document temperature of fresh sample. Indications: Long term (current) use of opiate analgesic (Z79.891)  . Comprehensive metabolic panel    Order Specific Question:  Has the patient fasted?    Answer:  No  . C-reactive protein  . Magnesium  . Sedimentation rate  . Vitamin B12    Indication: Bone Pain (M89.9)  . Vitamin D pnl(25-hydrxy+1,25-dihy)-bld    Imaging Ordered: None  Interventional Therapies: Scheduled: None at this time. PRN Procedures: None at this time. However, we are considering the possibility of diagnostic bilateral lumbar facet blocks under fluoroscopic guidance and IV sedation.    Referral(s) or Consult(s): None at this time.  Medications administered during this visit: Willie Krueger had no medications administered during this visit.  Future Appointments Date Time Provider [place]  03/03/2016 1:30 PM Christin Fudge, MD [facility] None  03/19/2016 2:30 PM Michel Bickers, MD [facility]  05/28/2016 10:00 AM Milinda Pointer, MD [facility] None    Primary Care Physician: Juanell Fairly, MD Location: [facility] Facility Note by: Kathlen Brunswick. Dossie Arbour, M.D, DABA, DABAPM, DABPM, DABIPP, FIPP  Pain Score Disclaimer: We use the NRS-11 scale. This is a self-reported, subjective measurement of pain severity with only modest accuracy. It is used primarily to identify changes within a particular patient. It must be understood that outpatient pain scales are significantly less accurate that those used for research, where they can be applied under ideal controlled circumstances with minimal exposure to variables. In reality, the score is likely to be a combination of pain intensity and pain affect, where pain affect describes the degree of emotional arousal or changes in action readiness caused by the sensory experience of pain. Factors such as social and work situation, setting, emotional state, anxiety levels, expectation, and prior pain experience may influence pain perception and show large inter-individual differences that may also be affected by time variables.

## 2016-02-27 NOTE — Patient Instructions (Addendum)
Smoking Cessation, Tips for Success If you are ready to quit smoking, congratulations! You have chosen to help yourself be healthier. Cigarettes bring nicotine, tar, carbon monoxide, and other irritants into your body. Your lungs, heart, and blood vessels will be able to work better without these poisons. There are many different ways to quit smoking. Nicotine gum, nicotine patches, a nicotine inhaler, or nicotine nasal spray can help with physical craving. Hypnosis, support groups, and medicines help break the habit of smoking. WHAT THINGS CAN I DO TO MAKE QUITTING EASIER?  Here are some tips to help you quit for good:  Pick a date when you will quit smoking completely. Tell all of your friends and family about your plan to quit on that date.  Do not try to slowly cut down on the number of cigarettes you are smoking. Pick a quit date and quit smoking completely starting on that day.  Throw away all cigarettes.   Clean and remove all ashtrays from your home, work, and car.  On a card, write down your reasons for quitting. Carry the card with you and read it when you get the urge to smoke.  Cleanse your body of nicotine. Drink enough water and fluids to keep your urine clear or pale yellow. Do this after quitting to flush the nicotine from your body.  Learn to predict your moods. Do not let a bad situation be your excuse to have a cigarette. Some situations in your life might tempt you into wanting a cigarette.  Never have "just one" cigarette. It leads to wanting another and another. Remind yourself of your decision to quit.  Change habits associated with smoking. If you smoked while driving or when feeling stressed, try other activities to replace smoking. Stand up when drinking your coffee. Brush your teeth after eating. Sit in a different chair when you read the paper. Avoid alcohol while trying to quit, and try to drink fewer caffeinated beverages. Alcohol and caffeine may urge you to  smoke.  Avoid foods and drinks that can trigger a desire to smoke, such as sugary or spicy foods and alcohol.  Ask people who smoke not to smoke around you.  Have something planned to do right after eating or having a cup of coffee. For example, plan to take a walk or exercise.  Try a relaxation exercise to calm you down and decrease your stress. Remember, you may be tense and nervous for the first 2 weeks after you quit, but this will pass.  Find new activities to keep your hands busy. Play with a pen, coin, or rubber band. Doodle or draw things on paper.  Brush your teeth right after eating. This will help cut down on the craving for the taste of tobacco after meals. You can also try mouthwash.   Use oral substitutes in place of cigarettes. Try using lemon drops, carrots, cinnamon sticks, or chewing gum. Keep them handy so they are available when you have the urge to smoke.  When you have the urge to smoke, try deep breathing.  Designate your home as a nonsmoking area.  If you are a heavy smoker, ask your health care provider about a prescription for nicotine chewing gum. It can ease your withdrawal from nicotine.  Reward yourself. Set aside the cigarette money you save and buy yourself something nice.  Look for support from others. Join a support group or smoking cessation program. Ask someone at home or at work to help you with your plan   to quit smoking.  Always ask yourself, "Do I need this cigarette or is this just a reflex?" Tell yourself, "Today, I choose not to smoke," or "I do not want to smoke." You are reminding yourself of your decision to quit.  Do not replace cigarette smoking with electronic cigarettes (commonly called e-cigarettes). The safety of e-cigarettes is unknown, and some may contain harmful chemicals.  If you relapse, do not give up! Plan ahead and think about what you will do the next time you get the urge to smoke. HOW WILL I FEEL WHEN I QUIT SMOKING? You  may have symptoms of withdrawal because your body is used to nicotine (the addictive substance in cigarettes). You may crave cigarettes, be irritable, feel very hungry, cough often, get headaches, or have difficulty concentrating. The withdrawal symptoms are only temporary. They are strongest when you first quit but will go away within 10-14 days. When withdrawal symptoms occur, stay in control. Think about your reasons for quitting. Remind yourself that these are signs that your body is healing and getting used to being without cigarettes. Remember that withdrawal symptoms are easier to treat than the major diseases that smoking can cause.  Even after the withdrawal is over, expect periodic urges to smoke. However, these cravings are generally short lived and will go away whether you smoke or not. Do not smoke! WHAT RESOURCES ARE AVAILABLE TO HELP ME QUIT SMOKING? Your health care provider can direct you to community resources or hospitals for support, which may include:  Group support.  Education.  Hypnosis.  Therapy.   This information is not intended to replace advice given to you by your health care provider. Make sure you discuss any questions you have with your health care provider.   Document Released: 09/12/2004 Document Revised: 01/05/2015 Document Reviewed: 06/02/2013 Elsevier Interactive Patient Education 2016 [place] to have blood work done today at Pre admit testing.

## 2016-03-03 ENCOUNTER — Encounter: Payer: 59 | Attending: Surgery | Admitting: Surgery

## 2016-03-03 DIAGNOSIS — L97514 Non-pressure chronic ulcer of other part of right foot with necrosis of bone: Secondary | ICD-10-CM | POA: Diagnosis not present

## 2016-03-03 DIAGNOSIS — E11621 Type 2 diabetes mellitus with foot ulcer: Secondary | ICD-10-CM | POA: Diagnosis not present

## 2016-03-03 DIAGNOSIS — I10 Essential (primary) hypertension: Secondary | ICD-10-CM | POA: Diagnosis not present

## 2016-03-03 DIAGNOSIS — L84 Corns and callosities: Secondary | ICD-10-CM | POA: Insufficient documentation

## 2016-03-03 DIAGNOSIS — M86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, right ankle and foot: Secondary | ICD-10-CM | POA: Insufficient documentation

## 2016-03-03 DIAGNOSIS — F17218 Nicotine dependence, cigarettes, with other nicotine-induced disorders: Secondary | ICD-10-CM | POA: Diagnosis not present

## 2016-03-03 DIAGNOSIS — E1140 Type 2 diabetes mellitus with diabetic neuropathy, unspecified: Secondary | ICD-10-CM | POA: Diagnosis not present

## 2016-03-03 NOTE — Progress Notes (Signed)
KRISTOF, ZECHMAN ([id number]) Visit Report for 03/03/2016 Arrival Information Details Patient Name: Willie Krueger, Willie Krueger. Date of Service: 03/03/2016 1:30 PM Medical Record Number: [id number] Patient Account Number: [id number] Date of Birth/Sex: [birth year] (60 y.o. Male) Treating RN: Macarthur Critchley Primary Care Physician: Juanell Fairly Other Clinician: Referring Physician: Juanell Fairly Treating Physician/Extender: Frann Rider in Treatment: 11 Visit Information History Since Last Visit All ordered tests and consults were completed: No Patient Arrived: Ambulatory Added or deleted any medications: No Arrival Time: 13:34 Any new allergies or adverse reactions: No Accompanied By: self Had a fall or experienced change in No Transfer Assistance: None activities of daily living that may affect Patient Identification Verified: Yes risk of falls: Secondary Verification Process Yes Signs or symptoms of abuse/neglect since last No Completed: visito Patient Requires Transmission- No Hospitalized since last visit: No Based Precautions: Has Dressing in Place as Prescribed: Yes Patient Has Alerts: Yes Has Footwear/Offloading in Place as Yes Patient Alerts: Patient on Blood Prescribed: Thinner Pain Present Now: No Aspirin, Dm2 Electronic Signature(s) Signed: 03/03/2016 3:39:32 PM By: [name], RN, [name] Entered By: [name] RN, [name] on 03/03/2016 13:35:12 [name] ([id number]) -------------------------------------------------------------------------------- Encounter Discharge Information Details Patient Name: Willie Krueger, Willie Krueger. Date of Service: 03/03/2016 1:30 PM Medical Record Number: [id number] Patient Account Number: [id number] Date of Birth/Sex: 09/15/1956 (60 y.o. Male) Treating RN: Macarthur Critchley Primary Care Physician: Juanell Fairly Other Clinician: Referring Physician: Juanell Fairly Treating Physician/Extender: Frann Rider in Treatment: 92 Encounter Discharge  Information Items Discharge Pain Level: 0 Discharge Condition: Stable Ambulatory Status: Ambulatory Emergency Discharge Destination: Room Transportation: Private Auto Accompanied By: self Schedule Follow-up Appointment: Yes Medication Reconciliation completed and provided to Patient/Care Yes Dannis Deroche: Provided on Clinical Summary of Care: 03/03/2016 Form Type Recipient Paper Patient GM Electronic Signature(s) Signed: 03/03/2016 3:39:32 PM By: [name] RN, [name] Previous Signature: 03/03/2016 2:28:07 PM Version By: Ruthine Dose Entered By: [name] RN, [name] on 03/03/2016 15:20:50 Willie Krueger, Willie Krueger ([id number]) -------------------------------------------------------------------------------- Lower Extremity Assessment Details Patient Name: Willie Krueger, Willie Krueger. Date of Service: 03/03/2016 1:30 PM Medical Record Number: [id number] Patient Account Number: [id number] Date of Birth/Sex: June 09, 1956 (60 y.o. Male) Treating RN: Macarthur Critchley Primary Care Physician: Juanell Fairly Other Clinician: Referring Physician: Juanell Fairly Treating Physician/Extender: Frann Rider in Treatment: 11 Edema Assessment Assessed: [Left: No] [Right: No] Edema: [Left: N] [Right: o] Vascular Assessment Pulses: Posterior Tibial Dorsalis Pedis Palpable: [Right:Yes] Extremity colors, hair growth, and conditions: Extremity Color: [Right:Mottled] Hair Growth on Extremity: [Right:Yes] Temperature of Extremity: [Right:Warm] Capillary Refill: [Right:< 3 seconds] Toe Nail Assessment Left: Right: Thick: No Discolored: No Deformed: No Improper Length and Hygiene: No Electronic Signature(s) Signed: 03/03/2016 3:39:32 PM By: [name], RN, [name] Entered By: [name] RN, [name] on 03/03/2016 14:05:36 [name] ([id number]) -------------------------------------------------------------------------------- Pain Assessment Details Patient Name: Willie Krueger, Willie Krueger. Date of Service: 03/03/2016 1:30 PM Medical  Record Number: [id number] Patient Account Number: [id number] Date of Birth/Sex: February 22, 1956 (60 y.o. Male) Treating RN: Macarthur Critchley Primary Care Physician: Juanell Fairly Other Clinician: Referring Physician: Juanell Fairly Treating Physician/Extender: Frann Rider in Treatment: 11 Active Problems Location of Pain Severity and Description of Pain Patient Has Paino Yes Site Locations Pain Location: Pain in Ulcers With Dressing Change: Yes Duration of the Pain. Constant / Intermittento Intermittent Rate the pain. Current Pain Level: 3 Character of Pain Describe the Pain: Aching Pain Management and Medication Current Pain Management: Electronic Signature(s) Signed: 03/03/2016 3:39:32 PM By: [name], RN, [name] Entered By: [name] RN, [name] on 03/03/2016 13:35:30 [name] ([id number]) --------------------------------------------------------------------------------  Patient/Caregiver Education Details Patient Name: Willie Krueger, Willie Krueger. Date of Service: 03/03/2016 1:30 PM Medical Record Number: [id number] Patient Account Number: [id number] Date of Birth/Gender: February 27, 1956 (60 y.o. Male) Treating RN: Macarthur Critchley Primary Care Physician: Juanell Fairly Other Clinician: Referring Physician: Juanell Fairly Treating Physician/Extender: Frann Rider in Treatment: 11 Education Assessment Education Provided To: Patient Education Topics Provided Wound/Skin Impairment: Handouts: Caring for Your Ulcer, Skin Care Do's and Dont's Methods: Explain/Verbal Responses: State content correctly Electronic Signature(s) Signed: 03/03/2016 3:39:32 PM By: [name], RN, [name] Entered By: [name] RN, [name] on 03/03/2016 14:27:45 Willie Krueger, Willie Krueger ([id number]) -------------------------------------------------------------------------------- Wound Assessment Details Patient Name: Willie Krueger, Willie Krueger. Date of Service: 03/03/2016 1:30 PM Medical Record Number: [id number] Patient Account Number:  [id number] Date of Birth/Sex: [birth year] (60 y.o. Male) Treating RN: Macarthur Critchley Primary Care Physician: Juanell Fairly Other Clinician: Referring Physician: Juanell Fairly Treating Physician/Extender: Frann Rider in Treatment: 11 Wound Status Wound Number: 1 Primary Diabetic Wound/Ulcer of the Lower Etiology: Extremity Wound Location: Right Toe Great Wound Open Wounding Event: Gradually Appeared Status: Date Acquired: 07/02/2015 Comorbid Anemia, Asthma, Hypertension, Type Weeks Of Treatment: 11 History: II Diabetes, Neuropathy Clustered Wound: No Photos Photo Uploaded By: [name], RN, [name] on 03/03/2016 15:34:50 Wound Measurements Length: (cm) 1 Width: (cm) 1.4 Depth: (cm) 1.1 Area: (cm) 1.1 Volume: (cm) 1.21 % Reduction in Area: -100% % Reduction in Volume: -2100% Tunneling: No Undermining: No Wound Description Classification: Grade 1 Wound Margin: Thickened Exudate Amount: Medium Exudate Type: Serosanguineous Exudate Color: red, brown Foul Odor After Cleansing: No Periwound Skin Texture Texture Color No Abnormalities Noted: No No Abnormalities Noted: No Callus: Yes Temperature / Pain Moisture Tenderness on Palpation: Yes Willie Krueger, Willie Krueger ([id number]) No Abnormalities Noted: No Dry / Scaly: Yes Wound Preparation Ulcer Cleansing: Rinsed/Irrigated with Saline Topical Anesthetic Applied: Other: lidocaine 4%, Treatment Notes Wound #1 (Right Toe Great) 1. Cleansed with: Clean wound with Normal Saline 2. Anesthetic Topical Lidocaine 4% cream to wound bed prior to debridement 4. Dressing Applied: Aquacel Ag 5. Secondary Dressing Applied Gauze and Kerlix/Conform Notes refer to Dr Megan Salon with possible hospital admission Electronic Signature(s) Signed: 03/03/2016 3:39:32 PM By: [name] RN, [name] Entered By: [name] RN, [name] on 03/03/2016 14:21:55 Willie Krueger, Willie Krueger  ([id number]) -------------------------------------------------------------------------------- [place] Details Patient Name: Willie Krueger, Willie Krueger. Date of Service: 03/03/2016 1:30 PM Medical Record Number: [id number] Patient Account Number: [id number] Date of Birth/Sex: 11-04-1956 (60 y.o. Male) Treating RN: Macarthur Critchley Primary Care Physician: Juanell Fairly Other Clinician: Referring Physician: Juanell Fairly Treating Physician/Extender: Frann Rider in Treatment: 11 Vital Signs Time Taken: 13:10 Temperature (F): 97.9 Height (in): 74 Pulse (bpm): 81 Weight (lbs): 242 Blood Pressure (mmHg): 125/73 Body Mass Index (BMI): 31.1 Reference Range: 80 - 120 mg / dl Electronic Signature(s) Signed: 03/03/2016 3:39:32 PM By: [name] RN, [name] Entered By: [name] RN, [name] on 03/03/2016 15:20:13

## 2016-03-03 NOTE — Progress Notes (Signed)
Willie Krueger ([id number]) Visit Report for 03/03/2016 Chief Complaint Document Details Patient Name: Willie Krueger, Willie Krueger. Date of Service: 03/03/2016 1:30 PM Medical Record Number: [id number] Patient Account Number: [id number] Date of Birth/Sex: Apr 04, 1956 (60 y.o. Male) Treating RN: Macarthur Critchley Primary Care Physician: Juanell Fairly Other Clinician: Referring Physician: Juanell Fairly Treating Physician/Extender: Frann Rider in Treatment: 11 Information Obtained from: Patient Chief Complaint Patients presents for treatment of an open diabetic ulcer to his right big toe which she's had for several months Electronic Signature(s) Signed: 03/03/2016 2:41:08 PM By: Christin Fudge MD, FACS Entered By: Christin Fudge on 03/03/2016 14:41:07 Willie Krueger ([id number]) -------------------------------------------------------------------------------- Debridement Details Patient Name: Willie Krueger. Date of Service: 03/03/2016 1:30 PM Medical Record Number: [id number] Patient Account Number: [id number] Date of Birth/Sex: Jul 27, 1956 (60 y.o. Male) Treating RN: Macarthur Critchley Primary Care Physician: Juanell Fairly Other Clinician: Referring Physician: Juanell Fairly Treating Physician/Extender: Frann Rider in Treatment: 11 Debridement Performed for Wound #1 Right Toe Great Assessment: Performed By: Physician Christin Fudge, MD Debridement: Debridement Pre-procedure Yes Verification/Time Out Taken: Start Time: 14:16 Pain Control: Lidocaine 4% Topical Solution Level: Skin/Subcutaneous Tissue Total Area Debrided (L x 0.5 (cm) x 0.5 (cm) = 0.25 (cm) W): Tissue and other Callus, Fibrin/Slough, Skin, Subcutaneous material debrided: Instrument: Blade, Forceps Bleeding: Minimum End Time: 14:17 Procedural Pain: 2 Post Procedural Pain: 2 Response to Treatment: Procedure was tolerated well Post Debridement Measurements of Total Wound Length: (cm) 1 Width: (cm) 1.4 Depth: (cm)  1.1 Volume: (cm) 1.21 Post Procedure Diagnosis Same as Pre-procedure Electronic Signature(s) Signed: 03/03/2016 2:41:00 PM By: Christin Fudge MD, FACS Signed: 03/03/2016 3:39:32 PM By: [name] RN, [name] Entered By: Christin Fudge on 03/03/2016 14:41:00 Willie Krueger ([id number]) -------------------------------------------------------------------------------- HPI Details Patient Name: Willie Krueger, Willie Krueger. Date of Service: 03/03/2016 1:30 PM Medical Record Number: [id number] Patient Account Number: [id number] Date of Birth/Sex: 01/11/1956 (60 y.o. Male) Treating RN: Macarthur Critchley Primary Care Physician: Juanell Fairly Other Clinician: Referring Physician: Juanell Fairly Treating Physician/Extender: Frann Rider in Treatment: 11 History of Present Illness Location: ulcerated area on his right big toe Quality: Patient reports No Pain. Severity: Patient states wound are getting worse. Duration: Patient has had the wound for > 3 months prior to seeking treatment at the wound center Timing: Pain in wound is Intermittent (comes and goes Context: The wound appeared gradually over time Modifying Factors: Consults to this date include: infectious disease who has put him on IV antibiotics with a PICC line Associated Signs and Symptoms: Patient reports having difficulty standing for long periods. HPI Description: 60 year old gentleman with multiple medical problems with a past medical history of diabetes mellitus, hypertension, neuropathy, chronic pain and nicotine abuse. he was recently seen by Dr. Vincenza Hews of infectious disease at [facility] for a osteomyelitis of the distal phalanx of his right great toe. He also had diffuse cellulitis of the right lower leg. He has been advised to have a PICC line and will receive IV vancomycin and oral ciprofloxacin plus oral metronidazole. he had an MRI which was showing edema of the distal phalanx with probably represent osteomyelitis. He smokes a  pack and a half of cigarettes a day. his last hemoglobin A1c was 8% A venous duplex of the right lower extremity was done recently on 11/30/2015 and this showed no evidence of DVT, superficial thrombophlebitis or incompetence. 01/11/2016 -- the patient only comes here once every 2 weeks and says he has several appointments and other issues which prevented him from coming for wound  care. He has declined hyperbaric oxygen therapy, declined to stop smoking and has declined to see a surgeon regarding options for his toe. 02/18/2016 -- the patient has not seen [place] for the last 5 weeks and has several excuses for not being here. In the meanwhile he says his PICC line was removed and he is on no antibiotics and his ID specialist Dr. Vincenza Hews told him that his infection was all gone. The patient continues to smoke, says his diabetes is under good control and he has not been doing any specific dressing to his right big toe. I have reviewed Dr. Hale Bogus note from 01/23/2016 -- he had completed 45 days of IV antibiotic with vancomycin, oral ciprofloxacin and oral metronidazole for the right great toe osteomyelitis. He thought that clinically the osteomyelitis has been cured and he stopped his IV antibiotics and had the PICC line removed. Follow-up in 6 weeks was recommended. I have just spoken to Dr. Megan Salon just now and he will call for the patient to review the cultures from today and put him on appropriate antibiotic. 02/25/2016 -- tissue culture report grew heavy growth of Escherichia coli which is sensitive to Kefzol and, cefepime, ceftriaxone, gentamicin, imipenem, Bactrim and Zosyn. I had spoken to Dr. Megan Salon over the phone and discussed the report with him and he is going to call the patient and put him on an appropriate antibiotic. I have reviewed Dr. Hale Bogus note on the electronic medical record -- foot cultures have grown [place] ([id number]) Escherichia coli and  Enterococcus casselflavus. He decide to treat him with cefadroxil and ampicillin sulbactam. 03/03/2016 -- over the last few days his leg has swollen up and his foot has turned red and his toe is discharging purulent material. He hasn't been on double oral antibiotic coverage for the last 8 days. Recent Right foot x-ray compared to a previous MRI shows soft tissue ulceration distal aspect of the right great toe with no acute erosive bony abnormalities or foreign body. After completing his examination and noting that he has significant cellulitis, purulent discharge and ulceration down probing to bone I spoke to his infectious disease specialist Dr. Vincenza Hews who agreed with me that the patient needs admission to the hospital, appropriate IV antibiotics, debridement by Dr. Celesta Gentile and to see me in follow-up. I have made arrangements for the patient to be admitted by the ER Electronic Signature(s) Signed: 03/03/2016 2:44:29 PM By: Christin Fudge MD, FACS Previous Signature: 03/03/2016 2:41:20 PM Version By: Christin Fudge MD, FACS Entered By: Christin Fudge on 03/03/2016 14:44:28 Willie Krueger, Willie Krueger ([id number]) -------------------------------------------------------------------------------- Physical Exam Details Patient Name: Willie Krueger, Willie Krueger. Date of Service: 03/03/2016 1:30 PM Medical Record Number: [id number] Patient Account Number: [id number] Date of Birth/Sex: [birth year] (60 y.o. Male) Treating RN: Macarthur Critchley Primary Care Physician: Juanell Fairly Other Clinician: Referring Physician: Juanell Fairly Treating Physician/Extender: Frann Rider in Treatment: 11 Constitutional . Pulse regular. Respirations normal and unlabored. Afebrile. . Eyes Nonicteric. Reactive to light. Ears, Nose, Mouth, and Throat Lips, teeth, and gums WNL.Marland Kitchen Moist mucosa without lesions. Neck supple and nontender. No palpable supraclavicular or cervical adenopathy. Normal sized without  goiter. Respiratory WNL. No retractions.. Cardiovascular Pedal Pulses WNL. No clubbing, cyanosis or edema. Lymphatic No adneopathy. No adenopathy. No adenopathy. Musculoskeletal Adexa without tenderness or enlargement.. Digits and nails w/o clubbing, cyanosis, infection, petechiae, ischemia, or inflammatory conditions.. Integumentary (Hair, Skin) No suspicious lesions. No crepitus or fluctuance. No peri-wound warmth or erythema. No masses.Marland Kitchen Psychiatric Judgement and insight  Intact.. No evidence of depression, anxiety, or agitation.. Notes the right toe and forefoot shows florid cellulitis and that is purulent discharge from his ulcerated wound on the plantar aspect of his right big toe. A lot of necrotic debris, callus and subcutaneous tissue was sharply debrided with a forcep and 15 number blade and this probes down to bone. brisk bleeding controlled with pressure. Electronic Signature(s) Signed: 03/03/2016 2:46:06 PM By: Christin Fudge MD, FACS Entered By: Christin Fudge on 03/03/2016 14:46:05 Willie Krueger, Willie Krueger ([id number]) -------------------------------------------------------------------------------- Physician Orders Details Patient Name: Willie Krueger, Willie Krueger. Date of Service: 03/03/2016 1:30 PM Medical Record Number: [id number] Patient Account Number: [id number] Date of Birth/Sex: 12/24/1956 (60 y.o. Male) Treating RN: Macarthur Critchley Primary Care Physician: Juanell Fairly Other Clinician: Referring Physician: Juanell Fairly Treating Physician/Extender: Frann Rider in Treatment: 60 Verbal / Phone Orders: Yes Clinician: Macarthur Critchley Read Back and Verified: Yes Diagnosis Coding Wound Cleansing Wound #1 Right Toe Great o Cleanse wound with mild soap and water o May Shower, gently pat wound dry prior to applying new dressing. Primary Wound Dressing o Aquacel Ag Secondary Dressing Wound #1 Right Toe Great o Gauze and Kerlix/Conform Dressing Change Frequency Wound #1  Right Toe Great o Change dressing every other day. Follow-up Appointments Wound #1 Right Toe Great o Return Appointment in 1 week. Off-Loading Wound #1 Right Toe Great o Other: - darco shoe Additional Orders / Instructions Wound #1 Right Toe Great o Stop Smoking o Other: - refer to ED, Dr Megan Salon for possible admission to hospital for eval, right foot swollen, red, tender will purulent drainage Electronic Signature(s) Signed: 03/03/2016 3:39:32 PM By: Ardean Larsen Signed: 03/03/2016 3:57:47 PM By: Christin Fudge MD, FACS Entered By: [name] RN, [name] on 03/03/2016 14:17:01 [place], WOLAVER ([id number]) Willie Krueger, Willie Krueger ([id number]) -------------------------------------------------------------------------------- Problem List Details Patient Name: Willie Krueger, Willie Krueger. Date of Service: 03/03/2016 1:30 PM Medical Record Number: [id number] Patient Account Number: [id number] Date of Birth/Sex: August 14, 1956 (60 y.o. 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 Date  of Service: 03/03/2016 1:30 PM Medical Record Number: [id number] Patient Account Number: [id number] Date of Birth/Sex: [birth year] (60 y.o. Male) Treating RN: Macarthur Critchley Primary Care Physician: Juanell Fairly Other Clinician: Referring Physician: Juanell Fairly Treating Physician/Extender: Frann Rider in Treatment: 11 Subjective Chief Complaint Information obtained from Patient Patients presents for treatment of an open diabetic ulcer to his right big toe which she's had for several months History of Present Illness (HPI) The following HPI elements were documented for the patient's wound: Location: ulcerated area on his right big toe Quality: Patient reports No Pain. Severity: Patient states wound are getting worse. Duration: Patient has had the wound for > 3 months prior to seeking treatment at the wound center Timing: Pain in wound is Intermittent (comes and goes Context: The wound appeared gradually over time Modifying  Factors: Consults to this date include: infectious disease who has put him on IV antibiotics with a PICC line Associated Signs and Symptoms: Patient reports having difficulty standing for long periods. 60 year old gentleman with multiple medical problems with a past medical history of diabetes mellitus, hypertension, neuropathy, chronic pain and nicotine abuse. he was recently seen by Dr. Vincenza Hews of infectious disease at [facility] for a osteomyelitis of the distal phalanx of his right great toe. He also had diffuse cellulitis of the right lower leg. He has been advised to have a PICC line and will receive IV vancomycin and oral ciprofloxacin plus oral metronidazole. he had an MRI which was showing edema of the distal phalanx with probably represent osteomyelitis. He smokes a pack and a half of cigarettes a day. his last hemoglobin A1c was 8% A venous duplex of the right lower extremity was done recently on 11/30/2015 and this showed no evidence of DVT,  superficial thrombophlebitis or incompetence. 01/11/2016 -- the patient only comes here once every 2 weeks and says he has several appointments and other issues which prevented him from coming for wound care. He has declined hyperbaric oxygen therapy, declined to stop smoking and has declined to see a surgeon regarding options for his toe. 02/18/2016 -- the patient has not seen [place] for the last 5 weeks and has several excuses for not being here. In the meanwhile he says his PICC line was removed and he is on no antibiotics and his ID specialist Dr. Vincenza Hews told him that his infection was all gone. The patient continues to smoke, says his diabetes is under good control and he has not been doing any specific dressing to his right big toe. I have reviewed Dr. Hale Bogus note from 01/23/2016 -- he had completed 45 days of IV antibiotic with vancomycin, oral ciprofloxacin and oral metronidazole for the right great toe osteomyelitis. Willie Krueger, Willie Krueger Willie Krueger. ([id number]) He thought that clinically the osteomyelitis has been cured and he stopped his IV antibiotics and had the PICC line removed. Follow-up in 6 weeks was recommended. I have just spoken to Dr. Megan Salon just now and he will call for the patient to review the cultures from today and put him on appropriate antibiotic. 02/25/2016 -- tissue culture report grew heavy growth of Escherichia coli which is sensitive to Kefzol and, cefepime, ceftriaxone, gentamicin, imipenem, Bactrim and Zosyn. I had spoken to Dr. Megan Salon over the phone and discussed the report with him and he is going to call the patient and put him on an appropriate antibiotic. I have reviewed Dr. Hale Bogus note on the electronic medical record -- foot cultures have grown Escherichia coli and Enterococcus casselflavus. He decide to treat him with cefadroxil and ampicillin sulbactam. 03/03/2016 -- over the last few days his leg has swollen up and his foot has turned red and his toe  is discharging purulent material. He hasn't been on double oral antibiotic coverage for the last 8 days. Recent Right foot x-ray compared to a previous MRI shows soft tissue ulceration distal aspect of the right great toe with no acute erosive bony abnormalities or foreign body. After completing his examination and noting that he has significant cellulitis, purulent discharge and ulceration down probing to bone I spoke to his infectious disease specialist Dr. Vincenza Hews who agreed with me that the patient needs admission to the hospital, appropriate IV antibiotics, debridement by Dr. Celesta Gentile and to see me in follow-up. I have made arrangements for the patient to be admitted  by the ER Objective Constitutional Pulse regular. Respirations normal and unlabored. Afebrile. Eyes Nonicteric. Reactive to light. Ears, Nose, Mouth, and Throat Lips, teeth, and gums WNL.Marland Kitchen Moist mucosa without lesions. Neck supple and nontender. No palpable supraclavicular or cervical adenopathy. Normal sized without goiter. Respiratory WNL. No retractions.. Cardiovascular Pedal Pulses WNL. No clubbing, cyanosis or edema. Lymphatic No adneopathy. No adenopathy. No adenopathy. DAMEER, LAMON Honor ([id number]) Musculoskeletal Adexa without tenderness or enlargement.. Digits and nails w/o clubbing, cyanosis, infection, petechiae, ischemia, or inflammatory conditions.Marland Kitchen Psychiatric Judgement and insight Intact.. No evidence of depression, anxiety, or agitation.. General Notes: the right toe and forefoot shows florid cellulitis and that is purulent discharge from his ulcerated wound on the plantar aspect of his right big toe. A lot of necrotic debris, callus and subcutaneous tissue was sharply debrided with a forcep and 15 number blade and this probes down to bone. brisk bleeding controlled with pressure. Integumentary (Hair, Skin) No suspicious lesions. No crepitus or fluctuance. No peri-wound warmth or  erythema. No masses.. Wound #1 status is Open. Original cause of wound was Gradually Appeared. The wound is located on the Right Toe Great. The wound measures 1cm length x 1.4cm width x 1.1cm depth; 1.1cm^2 area and 1.21cm^3 volume. There is no tunneling or undermining noted. There is a medium amount of serosanguineous drainage noted. The wound margin is thickened. The periwound skin appearance exhibited: Callus, Dry/Scaly. The periwound has tenderness on palpation. 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 He has been on oral antibiotics with double coverage and in spite of this has had progression of his cellulitis. I have recommended packing the wound with Aquacel Ag and putting an appropriate dressing over this and offloading it as before. TAVEN, ORBIN ([id number]) After completing his examination and noting that he has significant cellulitis, purulent discharge and ulceration down probing to bone I spoke to his infectious disease specialist Dr. Vincenza Hews who agreed with me that the patient needs admission to the hospital, appropriate IV antibiotics, debridement by Dr. Celesta Gentile and to see me in follow-up. I have made arrangements for the patient to be admitted by the ER. After much discussion he tells me he cannot go there today due to social obligations and will try and get the first thing in the morning. in the past the patient has refused hyperbaric oxygen therapy as he has many social economic reasons not to be  compliant. This time around he says he is going to try very hard to do this and I was asked him to get back to see me once he is discharged from the hospital. Procedures Wound #1 Wound #1 is a Diabetic Wound/Ulcer of the Lower Extremity located on the Right Toe Great . There was a Skin/Subcutaneous Tissue Debridement [id number]) debridement with total area of 0.25 sq cm performed by Christin Fudge, MD. with the following instrument(s): Blade and Forceps including Fibrin/Slough, Skin, Callus, and Subcutaneous after achieving pain control using Lidocaine 4% Topical Solution. A time out was conducted prior to the start of the procedure. A Minimum amount of bleeding was controlled with N/A. The procedure  was tolerated well with a pain level of 2 throughout and a pain level of 2 following the procedure. Post Debridement Measurements: 1cm length x 1.4cm width x 1.1cm depth; 1.21cm^3 volume. Post procedure Diagnosis Wound #1: Same as Pre-Procedure Plan Wound Cleansing: Wound #1 Right Toe Great: Cleanse wound with mild soap and water May Shower, gently pat wound dry prior to applying new dressing. Primary Wound Dressing: Aquacel Ag Secondary Dressing: Wound #1 Right Toe Great: Gauze and Kerlix/Conform Dressing Change Frequency: Wound #1 Right Toe Great: Change dressing every other day. Follow-up Appointments: Wound #1 Right Toe Great: Return Appointment in 1 week. Off-Loading: Wound #1 Right Toe GreatLYNNWOOD, KOHOUT ([id number]) Other: - darco shoe Additional Orders / Instructions: Wound #1 Right Toe Great: Stop Smoking Other: - refer to ED, Dr Megan Salon for possible admission to hospital for eval, right foot swollen, red, tender will purulent drainage This 60 year old diabetic who has a Wagner grade 3 diabetic foot ulcer with osteomyelitis has a florid infection and cellulitis of his right forefoot. He has been on oral antibiotics with double coverage and in spite of this has had  progression of his cellulitis. I have recommended packing the wound with Aquacel Ag and putting an appropriate dressing over this and offloading it as before. After completing his examination and noting that he has significant cellulitis, purulent discharge and ulceration down probing to bone I spoke to his infectious disease specialist Dr. Vincenza Hews who agreed with me that the patient needs admission to the hospital, appropriate IV antibiotics, debridement by Dr. Celesta Gentile and to see me in follow-up. I have made arrangements for the patient to be admitted by the ER. After much discussion he tells me he cannot go there today due to social obligations and will try and get the first thing in the morning. in the past the patient has refused hyperbaric oxygen therapy as he has many social economic reasons not to be compliant. This time around he says he is going to try very hard to do this and I was asked him to get back to see me once he is discharged from the hospital. Electronic Signature(s) Signed: 03/03/2016 3:20:52 PM By: Christin Fudge MD, FACS Previous Signature: 03/03/2016 2:47:13 PM Version By: Christin Fudge MD, FACS Entered By: Christin Fudge on 03/03/2016 15:20:52 LATHON, BRAGANZA ([id number]) -------------------------------------------------------------------------------- SuperBill Details Patient Name: EDDRICK, BOLEK. Date of Service: 03/03/2016 Medical Record Number: [id number] Patient Account Number: [id number] Date of Birth/Sex: Jul 13, 1956 (60 y.o. Male) Treating RN: Macarthur Critchley Primary Care Physician: Juanell Fairly Other Clinician: Referring Physician: Juanell Fairly Treating Physician/Extender: Frann Rider in Treatment: 11 Diagnosis Coding ICD-10 Codes Code Description E11.621 Type 2 diabetes mellitus with foot ulcer L97.514 Non-pressure chronic ulcer of other part of right foot with necrosis of bone M86.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, right ankle and  foot F17.218 Nicotine dependence, cigarettes, with other nicotine-induced disorders L84 Corns and callosities Facility Procedures CPT4 Code Description: [id number] - DEB SUBQ TISSUE 20 SQ CM/< ICD-10 Description Diagnosis E11.621 Type 2 diabetes mellitus with foot ulcer L97.514 Non-pressure chronic ulcer of other part of right fo M86.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, right  ankle and fo L84 Corns and callosities Modifier: ot with necros ot Quantity: 1 is of bone Physician Procedures CPT4 Code Description: [id number] - WC PHYS LEVEL 4 - EST PT ICD-10 Description Diagnosis E11.621 Type 2 diabetes mellitus with foot ulcer L97.514 Non-pressure chronic ulcer of other part of right fo M86.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, right  ankle  and fo L84 Corns and callosities Modifier: 25 ot with necros ot Quantity: 1 is of bone CPT4 Code Description: [id number] - WC PHYS SUBQ TISS 20 SQ CM ICD-10 Description Diagnosis E11.621 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

## 2016-03-04 ENCOUNTER — Encounter (HOSPITAL_COMMUNITY): Payer: Self-pay | Admitting: Emergency Medicine

## 2016-03-04 ENCOUNTER — Emergency Department (HOSPITAL_COMMUNITY)
Admission: EM | Admit: 2016-03-04 | Discharge: 2016-03-04 | Discharge status: Against Medical Advice | Payer: 59 | Attending: Emergency Medicine | Admitting: Emergency Medicine

## 2016-03-04 ENCOUNTER — Telehealth: Payer: Self-pay | Admitting: Internal Medicine

## 2016-03-04 ENCOUNTER — Telehealth: Payer: Self-pay | Admitting: *Deleted

## 2016-03-04 DIAGNOSIS — F17210 Nicotine dependence, cigarettes, uncomplicated: Secondary | ICD-10-CM | POA: Diagnosis not present

## 2016-03-04 DIAGNOSIS — I10 Essential (primary) hypertension: Secondary | ICD-10-CM | POA: Insufficient documentation

## 2016-03-04 DIAGNOSIS — L89619 Pressure ulcer of right heel, unspecified stage: Secondary | ICD-10-CM | POA: Diagnosis not present

## 2016-03-04 DIAGNOSIS — G8929 Other chronic pain: Secondary | ICD-10-CM | POA: Diagnosis not present

## 2016-03-04 DIAGNOSIS — L97419 Non-pressure chronic ulcer of right heel and midfoot with unspecified severity: Secondary | ICD-10-CM | POA: Insufficient documentation

## 2016-03-04 DIAGNOSIS — L089 Local infection of the skin and subcutaneous tissue, unspecified: Secondary | ICD-10-CM | POA: Diagnosis present

## 2016-03-04 DIAGNOSIS — Z7982 Long term (current) use of aspirin: Secondary | ICD-10-CM | POA: Insufficient documentation

## 2016-03-04 DIAGNOSIS — Z79899 Other long term (current) drug therapy: Secondary | ICD-10-CM | POA: Insufficient documentation

## 2016-03-04 DIAGNOSIS — E08621 Diabetes mellitus due to underlying condition with foot ulcer: Secondary | ICD-10-CM | POA: Insufficient documentation

## 2016-03-04 DIAGNOSIS — E785 Hyperlipidemia, unspecified: Secondary | ICD-10-CM | POA: Diagnosis not present

## 2016-03-04 DIAGNOSIS — R011 Cardiac murmur, unspecified: Secondary | ICD-10-CM | POA: Insufficient documentation

## 2016-03-04 DIAGNOSIS — Z7984 Long term (current) use of oral hypoglycemic drugs: Secondary | ICD-10-CM | POA: Diagnosis not present

## 2016-03-04 DIAGNOSIS — Z8739 Personal history of other diseases of the musculoskeletal system and connective tissue: Secondary | ICD-10-CM | POA: Diagnosis not present

## 2016-03-04 DIAGNOSIS — L97519 Non-pressure chronic ulcer of other part of right foot with unspecified severity: Secondary | ICD-10-CM

## 2016-03-04 DIAGNOSIS — M1990 Unspecified osteoarthritis, unspecified site: Secondary | ICD-10-CM | POA: Diagnosis not present

## 2016-03-04 LAB — COMPREHENSIVE METABOLIC PANEL
ALT: 23 U/L (ref 17–63)
AST: 23 U/L (ref 15–41)
Albumin: 3.4 g/dL — ABNORMAL LOW (ref 3.5–5.0)
Alkaline Phosphatase: 71 U/L (ref 38–126)
Anion gap: 7 (ref 5–15)
BUN: 16 mg/dL (ref 6–20)
CHLORIDE: 107 mmol/L (ref 101–111)
CO2: 24 mmol/L (ref 22–32)
CREATININE: 0.98 mg/dL (ref 0.61–1.24)
Calcium: 9.7 mg/dL (ref 8.9–10.3)
GFR calc non Af Amer: >60 mL/min (ref >60)
Glucose, Bld: 191 mg/dL — ABNORMAL HIGH (ref 65–99)
Potassium: 4.8 mmol/L (ref 3.5–5.1)
SODIUM: 138 mmol/L (ref 135–145)
Total Bilirubin: 0.8 mg/dL (ref 0.3–1.2)
Total Protein: 7.6 g/dL (ref 6.5–8.1)

## 2016-03-04 LAB — I-STAT CG4 LACTIC ACID, ED: Lactic Acid, Venous: 0.87 mmol/L (ref 0.5–2.0)

## 2016-03-04 LAB — CBC WITH DIFFERENTIAL/PLATELET
Basophils Absolute: 0 10*3/uL (ref 0.0–0.1)
Basophils Relative: 1 %
EOS PCT: 2 %
Eosinophils Absolute: 0.1 10*3/uL (ref 0.0–0.7)
HEMATOCRIT: 41.6 % (ref 39.0–52.0)
Hemoglobin: 13.9 g/dL (ref 13.0–17.0)
LYMPHS ABS: 2 10*3/uL (ref 0.7–4.0)
LYMPHS PCT: 28 %
MCH: 31.1 pg (ref 26.0–34.0)
MCHC: 33.4 g/dL (ref 30.0–36.0)
MCV: 93.1 fL (ref 78.0–100.0)
Monocytes Absolute: 0.4 10*3/uL (ref 0.1–1.0)
Monocytes Relative: 5 %
NEUTROS ABS: 4.8 10*3/uL (ref 1.7–7.7)
Neutrophils Relative %: 64 %
PLATELETS: 177 10*3/uL (ref 150–400)
RBC: 4.47 MIL/uL (ref 4.22–5.81)
RDW: 13.9 % (ref 11.5–15.5)
WBC: 7.4 10*3/uL (ref 4.0–10.5)

## 2016-03-04 MED ORDER — PIPERACILLIN-TAZOBACTAM 3.375 G IVPB 30 MIN
3.3750 g | Freq: Once | INTRAVENOUS | Status: AC
  Administered 2016-03-04: 3.375 g via INTRAVENOUS
  Filled 2016-03-04: qty 50

## 2016-03-04 NOTE — Telephone Encounter (Signed)
I received a phone call from Dr. Milas Gain yesterday. He saw Willie Krueger again yesterday and felt like his toe infection was getting worse despite being on his oral antibiotics. He did some more debridement but did not submit any specimens for culture since he was already on antibiotics. He recommended that Willie Krueger be admitted for surgical evaluation and so a PICC could be placed and restart him on IV antibiotics. I spoke with Willie Krueger this morning and he said that he would come to the emergency department at [facility] today. His most recent wound cultures grew Escherichia coli and enterococcus.

## 2016-03-04 NOTE — ED Notes (Signed)
Pt has infection disease doctor and wound doctor who took sample of tissue in R big toe, pt foot is very red and swollen with streaks. Pt toe has ulcer on tip.

## 2016-03-04 NOTE — Telephone Encounter (Signed)
Wondering about the plan of care.  Is he supposed to go to the ED?  Can he receive antibiotics at home with a PICC.  Does he need to be hospitalized?  His wife has the same questions.  MD please advise.

## 2016-03-04 NOTE — ED Provider Notes (Signed)
CSN: [id number]     Arrival date & time 03/04/16  1500 History   First MD Initiated Contact with Patient 03/04/16 2056     Chief Complaint  Patient presents with  . Nail Problem  . Wound Infection   Patient is a 60 y.o. male presenting with wound check. The history is provided by the patient and medical records. No language interpreter was used.  Wound Check This is a recurrent problem. The current episode started 1 to 4 weeks ago. The problem occurs constantly. The problem has been gradually worsening. Pertinent negatives include no abdominal pain, anorexia, chest pain, chills, congestion, coughing, fever, headaches, joint swelling, nausea, neck pain, numbness, rash, sore throat, urinary symptoms, vomiting or weakness. Nothing aggravates the symptoms. He has tried nothing for the symptoms. The treatment provided mild relief.    Past Medical History  Diagnosis Date  . Diabetes mellitus without complication ([place])   . Hypertension   . Chronic pain   . Hyperlipidemia   . Osteoarthritis   . Heart murmur   . Mitral valve regurgitation   . History of spinal stenosis 10/31/2015    C3-4 with severe central canal stenosis with small amount of myelomalacia present. Moderate to severe canal stenosis is also present at C4-5 and C5-6. Moderate to severe neuroforaminal narrowing between the levels of C2-C7  . Leg weakness 07/07/2014  . Nerve root inflammation 06/26/2014    Last Assessment & Plan:  Status is unchanged with ongoing pain and limited mobility. Continue with physical therapy and pain management.   . Chronic pain associated with significant psychosocial dysfunction 05/04/2015    Last Assessment & Plan:  He is having worsening problems with breakthrough pain. Gabapentin is increased from 800 mg 3 times daily to 1200 mg 3 times daily. Oxycodone is also renewed. He continues to through evaluation at [facility] pain clinic.    Past Surgical History  Procedure Laterality Date  . Carpal tunnel release     . Spine surgery     Family History  Problem Relation Age of Onset  . Cancer Mother   . Diabetes Mother   . Heart disease Father    Social History  Substance Use Topics  . Smoking status: Current Every Day Smoker -- 1.50 packs/day    Types: Cigarettes  . Smokeless tobacco: Never Used  . Alcohol Use: No    Review of Systems  Constitutional: Negative for fever, chills, activity change and appetite change.  HENT: Negative for congestion, dental problem, ear pain, facial swelling, hearing loss, rhinorrhea, sneezing, sore throat, trouble swallowing and voice change.   Eyes: Negative for photophobia, pain, redness and visual disturbance.  Respiratory: Negative for apnea, cough, chest tightness, shortness of breath, wheezing and stridor.   Cardiovascular: Negative for chest pain, palpitations and leg swelling.  Gastrointestinal: Negative for nausea, vomiting, abdominal pain, diarrhea, constipation, blood in stool, abdominal distention and anorexia.  Endocrine: Negative for polydipsia and polyuria.  Genitourinary: Negative for frequency, hematuria, flank pain, decreased urine volume and difficulty urinating.  Musculoskeletal: Negative for back pain, joint swelling, gait problem, neck pain and neck stiffness.  Skin: Positive for wound. Negative for rash.  Allergic/Immunologic: Negative for immunocompromised state.  Neurological: Negative for dizziness, syncope, facial asymmetry, speech difficulty, weakness, light-headedness, numbness and headaches.  Hematological: Negative for adenopathy.  Psychiatric/Behavioral: Negative for suicidal ideas, behavioral problems, confusion, sleep disturbance and agitation. The patient is not nervous/anxious.   All other systems reviewed and are negative.     Allergies  Cleocin  and Sulfa antibiotics  Home Medications   Prior to Admission medications   Medication Sig Start Date End Date Taking? Authorizing Provider  acetaminophen (TYLENOL) 500 MG  tablet Take 500 mg by mouth every 4 (four) hours. Scheduled (5 times daily)   Yes Historical Provider, MD  amoxicillin-clavulanate (AUGMENTIN) 875-125 MG tablet Take 1 tablet by mouth 2 (two) times daily. 02/23/16  Yes Michel Bickers, MD  aspirin EC 81 MG tablet Take 81 mg by mouth daily.   Yes Historical Provider, MD  cefadroxil (DURICEF) 500 MG capsule Take 1 capsule (500 mg total) by mouth 2 (two) times daily. 02/23/16  Yes Michel Bickers, MD  Cholecalciferol (VITAMIN D3) 2000 units capsule Take 2,000 Units by mouth daily.  01/23/16  Yes Historical Provider, MD  diphenhydramine-acetaminophen (TYLENOL PM) 25-500 MG TABS tablet Take 2 tablets by mouth at bedtime.   Yes Historical Provider, MD  furosemide (LASIX) 20 MG tablet Take 40 mg by mouth 2 (two) times daily.    Yes Historical Provider, MD  gabapentin (NEURONTIN) 600 MG tablet Take 2 tablets (1,200 mg total) by mouth every 6 (six) hours as needed. 02/27/16  Yes Milinda Pointer, MD  glimepiride (AMARYL) 2 MG tablet Take 2 mg by mouth daily with breakfast.    Yes Historical Provider, MD  lisinopril (PRINIVIL,ZESTRIL) 20 MG tablet Take 20 mg by mouth at bedtime.  10/10/15  Yes Historical Provider, MD  metformin (FORTAMET) 1000 MG (OSM) 24 hr tablet Take 1,000 mg by mouth 2 (two) times daily.  10/02/11  Yes Historical Provider, MD  metFORMIN (GLUCOPHAGE-XR) 500 MG 24 hr tablet Take 500 mg by mouth 2 (two) times daily. 12/03/15  Yes Historical Provider, MD  Naloxone HCl (NARCAN) 4 MG/0.1ML LIQD Place 1 spray into the nose every 5 (five) minutes as needed (for pain medication overdose.). Spray half into each nostril, then call 911 02/27/16  Yes Milinda Pointer, MD  oxyCODONE (ROXICODONE) 15 MG immediate release tablet Take 1 tablet (15 mg total) by mouth 5 (five) times daily as needed for pain. 02/27/16  Yes Milinda Pointer, MD  pravastatin (PRAVACHOL) 20 MG tablet Take 20 mg by mouth at bedtime.  01/30/12  Yes Historical Provider, MD  sildenafil (VIAGRA) 100  MG tablet Take 100 mg by mouth daily as needed for erectile dysfunction.   Yes Historical Provider, MD   BP 111/61 mmHg  Pulse 67  Temp(Src) 98 F (36.7 C) (Oral)  Resp 18  Ht 6\' 2"  (1.88 m)  Wt 105.643 kg  BMI 29.89 kg/m2  SpO2 96% Physical Exam  Constitutional: He is oriented to person, place, and time. He appears well-developed and well-nourished. No distress.  HENT:  Head: Normocephalic and atraumatic.  Right Ear: External ear normal.  Left Ear: External ear normal.  Eyes: Pupils are equal, round, and reactive to light. Right eye exhibits no discharge. Left eye exhibits no discharge.  Neck: Normal range of motion. No JVD present. No tracheal deviation present.  Cardiovascular: Normal rate, regular rhythm and normal heart sounds.  Exam reveals no friction rub.   No murmur heard. Pulmonary/Chest: Effort normal and breath sounds normal. No stridor. No respiratory distress. He has no wheezes.  Abdominal: Soft. Bowel sounds are normal. He exhibits no distension. There is no rebound and no guarding.  Musculoskeletal: Normal range of motion. He exhibits no edema or tenderness.  Lymphadenopathy:    He has no cervical adenopathy.  Neurological: He is alert and oriented to person, place, and time. No cranial nerve deficit.  Coordination normal.  Skin: Skin is warm and dry. No rash noted. No pallor.     Psychiatric: He has a normal mood and affect. His behavior is normal. Judgment and thought content normal.  Nursing note and vitals reviewed.   ED Course  Procedures (including critical care time) Labs Review Labs Reviewed  COMPREHENSIVE METABOLIC PANEL - Abnormal; Notable for the following:    Glucose, Bld 191 (*)    Albumin 3.4 (*)    All other components within normal limits  CULTURE, BLOOD (ROUTINE X 2)  CULTURE, BLOOD (ROUTINE X 2)  CBC WITH DIFFERENTIAL/PLATELET  I-STAT CG4 LACTIC ACID, ED  I-STAT CG4 LACTIC ACID, ED    Imaging Review No results found. I have  personally reviewed and evaluated these images and lab results as part of my medical decision-making.   EKG Interpretation None      MDM   Final diagnoses:  Diabetic ulcer of right foot associated with diabetes mellitus due to underlying condition [facility])    Patient is a gentleman with a known diabetic foot ulcer. He's been on outpatient antibiotics for the past 8 days without improvement in his symptoms. He was sent to the ED for admission by his infectious disease and wound doctor.  Upon arrival patient afebrile. He has an ulcer to his right great toe. There is surrounding erythema. He has no fevers, chills, systemic symptoms.  CBC, CMP, lactate unremarkable. Blood cultures pending. I reviewed with our pharmacist culture results, past antibiotics, allergies and we decided on Zosyn as the appropriate antibiotic.  I tried to admit patient however he declined to stay in the hospital overnight. He states that he was under the impression that he was coming to the ED to have a PICC placed and a outpatient antibiotics set up tonight. He states he is unable to stay tonight as he has a family member who relies on him to get to dialysis. Patient without systemic signs of illness however he has clearly failed outpatient treatment of his diabetic foot ulcer. We discussed risks of worse infection. He states that he will go home and come back to ED tomorrow for admission and PICC line. I encouraged him to come back sooner if he feels worse. Patient signed out AMA.  He understands risks of deterioration or death.    I discussed case with my attending, Dr. Roxanne Mins.     Vira Blanco, MD [id number]  Delora Fuel, MD [id number]

## 2016-03-04 NOTE — ED Notes (Signed)
Pt states "my doctor is concerned about my toe infection. I have a toe infection on my R toe, was told to come here and be evaluated" Pt states "they might want me to have a PICC for antibiotics". Pt taking oral antibiotics for 8 days. Hx of diabetes.

## 2016-03-04 NOTE — Discharge Instructions (Signed)
Please return to the ED or seek further outpatient care if you change your mind about hospitalization  Diabetes and Foot Care Diabetes may cause you to have problems because of poor blood supply (circulation) to your feet and legs. This may cause the skin on your feet to become thinner, break easier, and heal more slowly. Your skin may become dry, and the skin may peel and crack. You may also have nerve damage in your legs and feet causing decreased feeling in them. You may not notice minor injuries to your feet that could lead to infections or more serious problems. Taking care of your feet is one of the most important things you can do for yourself.  HOME CARE INSTRUCTIONS  Wear shoes at all times, even in the house. Do not go barefoot. Bare feet are easily injured.  Check your feet daily for blisters, cuts, and redness. If you cannot see the bottom of your feet, use a mirror or ask someone for help.  Wash your feet with warm water (do not use hot water) and mild soap. Then pat your feet and the areas between your toes until they are completely dry. Do not soak your feet as this can dry your skin.  Apply a moisturizing lotion or petroleum jelly (that does not contain alcohol and is unscented) to the skin on your feet and to dry, brittle toenails. Do not apply lotion between your toes.  Trim your toenails straight across. Do not dig under them or around the cuticle. File the edges of your nails with an emery board or nail file.  Do not cut corns or calluses or try to remove them with medicine.  Wear clean socks or stockings every day. Make sure they are not too tight. Do not wear knee-high stockings since they may decrease blood flow to your legs.  Wear shoes that fit properly and have enough cushioning. To break in new shoes, wear them for just a few hours a day. This prevents you from injuring your feet. Always look in your shoes before you put them on to be sure there are no objects  inside.  Do not cross your legs. This may decrease the blood flow to your feet.  If you find a minor scrape, cut, or break in the skin on your feet, keep it and the skin around it clean and dry. These areas may be cleansed with mild soap and water. Do not cleanse the area with peroxide, alcohol, or iodine.  When you remove an adhesive bandage, be sure not to damage the skin around it.  If you have a wound, look at it several times a day to make sure it is healing.  Do not use heating pads or hot water bottles. They may burn your skin. If you have lost feeling in your feet or legs, you may not know it is happening until it is too late.  Make sure your health care provider performs a complete foot exam at least annually or more often if you have foot problems. Report any cuts, sores, or bruises to your health care provider immediately. SEEK MEDICAL CARE IF:   You have an injury that is not healing.  You have cuts or breaks in the skin.  You have an ingrown nail.  You notice redness on your legs or feet.  You feel burning or tingling in your legs or feet.  You have pain or cramps in your legs and feet.  Your legs or feet are  numb.  Your feet always feel cold. SEEK IMMEDIATE MEDICAL CARE IF:   There is increasing redness, swelling, or pain in or around a wound.  There is a red line that goes up your leg.  Pus is coming from a wound.  You develop a fever or as directed by your health care provider.  You notice a bad smell coming from an ulcer or wound.   This information is not intended to replace advice given to you by your health care provider. Make sure you discuss any questions you have with your health care provider.   Document Released: 12/12/2000 Document Revised: 08/17/2013 Document Reviewed: 05/24/2013 Elsevier Interactive Patient Education [employer].

## 2016-03-05 ENCOUNTER — Telehealth: Payer: Self-pay | Admitting: *Deleted

## 2016-03-05 DIAGNOSIS — M869 Osteomyelitis, unspecified: Principal | ICD-10-CM

## 2016-03-05 DIAGNOSIS — E1169 Type 2 diabetes mellitus with other specified complication: Secondary | ICD-10-CM

## 2016-03-05 NOTE — Addendum Note (Signed)
Addended by: Lorne Skeens D on: 03/05/2016 02:43 PM   Modules accepted: Orders

## 2016-03-05 NOTE — Telephone Encounter (Signed)
Please put in an order for IR to place a single-lumen PICC. I will put in antibiotic orders within the next 24 hours. Also please ask Mr. Mcmanigal to schedule a follow-up visit with his podiatrist Dr. Celesta Gentile.

## 2016-03-05 NOTE — Telephone Encounter (Addendum)
Pt shared that he will call his PCP to set up an appt.  Pt stated the he "had an issue" with Dr. Jacqualyn Posey.  Left message for [facility] scheduling, please schedule PICC for early AM on M/W/F per pt request.

## 2016-03-05 NOTE — Telephone Encounter (Signed)
Unable to stay at ED last night.  Requesting OP PICC placement.  He was at the ED yesterday waiting.  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  He would appreciate having the PICC placed OP and then receive IV medication at home as before.

## 2016-03-06 LAB — TOXASSURE SELECT 13 (MW), URINE: PDF: 0

## 2016-03-06 NOTE — Progress Notes (Signed)

## 2016-03-06 NOTE — Progress Notes (Signed)
Quick Note:   Normal fasting (NPO x 8 hours) glucose levels are between 65-99 mg/dl, with 2 hour fasting, levels are usually less than 140 mg/dl. Any random blood glucose level greater than 200 mg/dl is considered to be Diabetes.  ______ 

## 2016-03-06 NOTE — Progress Notes (Signed)
Quick Note:  Lab results reviewed and found to be within normal limits. ______ 

## 2016-03-07 NOTE — Telephone Encounter (Signed)
Left message for IR scheduler about the pt's preferences for scheduling PICC placement.  Looks like it will be next week before PICC can be placed.

## 2016-03-09 LAB — CULTURE, BLOOD (ROUTINE X 2)
CULTURE: NO GROWTH
Culture: NO GROWTH

## 2016-03-10 ENCOUNTER — Ambulatory Visit (HOSPITAL_COMMUNITY)
Admission: RE | Admit: 2016-03-10 | Discharge: 2016-03-10 | Disposition: A | Discharge status: Home / Self Care | Payer: 59 | Source: Ambulatory Visit | Attending: Internal Medicine | Admitting: Internal Medicine

## 2016-03-10 ENCOUNTER — Other Ambulatory Visit: Payer: Self-pay | Admitting: Internal Medicine

## 2016-03-10 DIAGNOSIS — E1169 Type 2 diabetes mellitus with other specified complication: Secondary | ICD-10-CM

## 2016-03-10 DIAGNOSIS — M869 Osteomyelitis, unspecified: Principal | ICD-10-CM

## 2016-03-10 MED ORDER — VANCOMYCIN HCL IN DEXTROSE 1-5 GM/200ML-% IV SOLN: 1000.0000 mg | INTRAVENOUS | Status: DC

## 2016-03-10 MED ORDER — LIDOCAINE HCL 1 % IJ SOLN
INTRAMUSCULAR | Status: AC
  Filled 2016-03-10: qty 20

## 2016-03-10 MED ORDER — HEPARIN SOD (PORK) LOCK FLUSH 100 UNIT/ML IV SOLN
INTRAVENOUS | Status: AC
Start: 2016-03-10 — End: 2016-03-10
  Filled 2016-03-10: qty 5

## 2016-03-10 MED ORDER — SODIUM CHLORIDE 0.9 % IV SOLN: 1.0000 g | INTRAVENOUS | Status: DC

## 2016-03-11 ENCOUNTER — Ambulatory Visit: Payer: 59 | Admitting: Internal Medicine

## 2016-03-11 ENCOUNTER — Telehealth: Payer: Self-pay | Admitting: *Deleted

## 2016-03-11 ENCOUNTER — Other Ambulatory Visit: Payer: Self-pay | Admitting: *Deleted

## 2016-03-11 NOTE — Telephone Encounter (Signed)
Needing order for "Anaphylactic Kit" due to pt has not received the ertapenem through their agency in the past.  RN will fax order for this kit.  [facility] will make every effort to start care today.

## 2016-03-11 NOTE — Progress Notes (Signed)
IV antibiotic order and appropriate support documents faxed to [facility] - [phone].  Spoke with them this AM to let them know that this needed to start ASAP.  [phone]  Corrected medication list per verbal order Dr. Megan Salon.   Notified the pt that the infusion company would be contacting him about setting up an appointment for "start of care."  Patient verbalized understanding.  RN reminded pt of his appointment with Dr. Megan Salon for 03/19/16.

## 2016-03-17 ENCOUNTER — Encounter: Payer: 59 | Admitting: Surgery

## 2016-03-17 ENCOUNTER — Encounter: Payer: Self-pay | Admitting: Internal Medicine

## 2016-03-17 ENCOUNTER — Ambulatory Visit (INDEPENDENT_AMBULATORY_CARE_PROVIDER_SITE_OTHER): Payer: 59 | Admitting: Internal Medicine

## 2016-03-17 DIAGNOSIS — M869 Osteomyelitis, unspecified: Secondary | ICD-10-CM

## 2016-03-17 DIAGNOSIS — E1169 Type 2 diabetes mellitus with other specified complication: Secondary | ICD-10-CM | POA: Diagnosis not present

## 2016-03-17 DIAGNOSIS — E11621 Type 2 diabetes mellitus with foot ulcer: Secondary | ICD-10-CM | POA: Diagnosis not present

## 2016-03-17 LAB — C-REACTIVE PROTEIN: CRP: 1.2 mg/dL — AB (ref <0.60)

## 2016-03-17 NOTE — Progress Notes (Signed)
[facility]  Patient Active Problem List   Diagnosis Date Noted  . Diabetic osteomyelitis ([place]) 12/05/2015    Priority: High  . Obesity 12/05/2015    Priority: Medium  . Chronic pain 10/31/2015    Priority: Medium  . HLD (hyperlipidemia) 10/20/2012    Priority: Medium  . Current smoker 10/02/2011    Priority: Medium  . Neurogenic pain 02/27/2016  . Diabetic peripheral neuropathy ([place]) 02/27/2016  . Chronic shoulder pain (Location of Tertiary source of pain) (Bilateral) (R>L) 02/27/2016  . Type 2 diabetes mellitus with ulcer ([place]) 01/23/2016  . Neuropathy ([place]) 12/05/2015  . Encounter for chronic pain management 12/03/2015  . Elevated C-reactive protein (CRP) 12/03/2015  . Long term current use of opiate analgesic 10/31/2015  . Long term prescription opiate use 10/31/2015  . Opiate use (112.5 MME/Day) 10/31/2015  . Opiate dependence ([place]) 10/31/2015  . Encounter for therapeutic drug level monitoring 10/31/2015  . Chronic neck pain (Right side) 10/31/2015  . Cervical spondylosis 10/31/2015  . Cervical post-laminectomy syndrome 10/31/2015  . Chronic low back pain (Location of Primary Source of Pain) (Midline pain) (R>L) 10/31/2015  . Chronic cervical radicular pain (Bilateral) (R>L) 10/31/2015  . Chronic lumbar radicular pain (Bilateral) (R>L) 10/31/2015  . Substance use disorder Risk: LOW 10/31/2015  . 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 Chronic lower extremity pain (Location of Secondary source of pain) (Bilateral) (R>L) 10/31/2015  . History of carpal tunnel release of both wrists 10/31/2015  . Grade I anterolisthesis of C7 on T1 10/31/2015  . Toe ulcer ([place]) 06/24/2015  . Cervical spinal cord compression ([place]) 02/07/2015  . MI (mitral incompetence) 11/11/2014  . Cardiac murmur 11/09/2014  . History of decompression of median nerve 05/19/2014  . Benign hypertension 10/20/2012  . ED (erectile dysfunction) of organic origin  10/20/2012  . Chronic hepatitis C virus infection ([place]) 10/02/2011  . Adiposity 10/02/2011    Patient's Medications  New Prescriptions   No medications on file  Previous Medications   ACETAMINOPHEN (TYLENOL) 500 MG TABLET    Take 500 mg by mouth every 4 (four) hours. Scheduled (5 times daily)   ASPIRIN EC 81 MG TABLET    Take 81 mg by mouth daily.   CHOLECALCIFEROL (VITAMIN D3) 2000 UNITS CAPSULE    Take 2,000 Units by mouth daily.    DIPHENHYDRAMINE-ACETAMINOPHEN (TYLENOL PM) 25-500 MG TABS TABLET    Take 2 tablets by mouth at bedtime. Reported on 03/17/2016   ERTAPENEM 1 G IN SODIUM CHLORIDE 0.9 % 50 ML    Inject 1 g into the vein daily.   FUROSEMIDE (LASIX) 20 MG TABLET    Take 40 mg by mouth 2 (two) times daily.    GABAPENTIN (NEURONTIN) 600 MG TABLET    Take 2 tablets (1,200 mg total) by mouth every 6 (six) hours as needed.   GLIMEPIRIDE (AMARYL) 2 MG TABLET    Take 2 mg by mouth daily with breakfast.    LISINOPRIL (PRINIVIL,ZESTRIL) 40 MG TABLET    Take 40 mg by mouth daily.   METFORMIN (GLUCOPHAGE-XR) 500 MG 24 HR TABLET    Take 500 mg by mouth 2 (two) times daily.   NALOXONE HCL (NARCAN) 4 MG/0.1ML LIQD    Place 1 spray into the nose every 5 (five) minutes as needed (for pain medication overdose.). Spray half into each nostril, then call 911   OXYCODONE (ROXICODONE) 15 MG IMMEDIATE RELEASE TABLET    Take  1 tablet (15 mg total) by mouth 5 (five) times daily as needed for pain.   PRAVASTATIN (PRAVACHOL) 20 MG TABLET    Take 20 mg by mouth at bedtime.    SILDENAFIL (VIAGRA) 100 MG TABLET    Take 100 mg by mouth daily as needed for erectile dysfunction.   VANCOMYCIN (VANCOCIN) 1 GM/200ML SOLN    Inject 200 mLs (1,000 mg total) into the vein daily.  Modified Medications   No medications on file  Discontinued Medications   LISINOPRIL (PRINIVIL,ZESTRIL) 20 MG TABLET    Take 20 mg by mouth at bedtime.    METFORMIN (FORTAMET) 1000 MG (OSM) 24 HR TABLET    Take 1,000 mg by mouth 2 (two)  times daily.     Subjective: Willie Krueger is in for his routine follow-up visit. He is now been on IV vancomycin and ertapenem for one week before his right great toe osteomyelitis. He's had no problems tolerating his PICC or antibiotics. He feels like the swelling in his right foot is going down. He states that he has not been back to see his podiatrist, Dr. Celesta Gentile, because Dr. Jacqualyn Posey never called him back. He tells me that his primary care physician is working on a referral to a Chiropodist.  Review of Systems: Review of Systems  Constitutional: Negative for fever, chills, weight loss, malaise/fatigue and diaphoresis.  HENT: Negative for sore throat.   Respiratory: Negative for cough, sputum production and shortness of breath.   Cardiovascular: Negative for chest pain.  Gastrointestinal: Negative for nausea, vomiting and diarrhea.  Skin: Negative for rash.  Neurological: Negative for headaches.    Past Medical History  Diagnosis Date  . Diabetes mellitus without complication ([place])   . Hypertension   . Chronic pain   . Hyperlipidemia   . Osteoarthritis   . Heart murmur   . Mitral valve regurgitation   . History of spinal stenosis 10/31/2015    C3-4 with severe central canal stenosis with small amount of myelomalacia present. Moderate to severe canal stenosis is also present at C4-5 and C5-6. Moderate to severe neuroforaminal narrowing between the levels of C2-C7  . Leg weakness 07/07/2014  . Nerve root inflammation 06/26/2014    Last Assessment & Plan:  Status is unchanged with ongoing pain and limited mobility. Continue with physical therapy and pain management.   . Chronic pain associated with significant psychosocial dysfunction 05/04/2015    Last Assessment & Plan:  He is having worsening problems with breakthrough pain. Gabapentin is increased from 800 mg 3 times daily to 1200 mg 3 times daily. Oxycodone is also renewed. He continues to through evaluation at [facility] pain  clinic.     Social History  Substance Use Topics  . Smoking status: Current Every Day Smoker -- 1.50 packs/day    Types: Cigarettes  . Smokeless tobacco: Never Used  . Alcohol Use: No    Family History  Problem Relation Age of Onset  . Cancer Mother   . Diabetes Mother   . Heart disease Father     Allergies  Allergen Reactions  . Cleocin [Clindamycin] Rash  . Sulfa Antibiotics Rash    Objective: Filed Vitals:   03/17/16 1444  BP: 112/69  Pulse: 80  Temp: 98.4 F (36.9 C)  TempSrc: Oral  Height: 6\' 2"  (1.88 m)  Weight: 238 lb (107.956 kg)   Body mass index is 30.54 kg/(m^2).  Physical Exam  Constitutional:  He is talkative and in good spirits as  usual.  Eyes: Conjunctivae are normal.  Cardiovascular: Normal rate and regular rhythm.   No murmur heard. Pulmonary/Chest: Breath sounds normal.  Abdominal: Soft.  Musculoskeletal:  His right great toe, foot and leg are less swollen than previously. The redness has faded. He has a thick callus with a central ulceration over the tip of his right great toe. There is a cotton wick in the center. There is no drainage or odor.  Skin: No rash noted.  Right arm PICC site looks good.  Psychiatric: Mood and affect normal.    Lab Results SED RATE (mm/hr)  Date Value  02/27/2016 9  02/20/2016 11  11/16/2015 6   CRP (mg/dL)  Date Value  02/27/2016 1.8*  02/20/2016 1.3*  11/16/2015 1.8*     Problem List Items Addressed This Visit      High   Diabetic osteomyelitis ([place])    His foot is looking a little bit better since switching back to broad IV antibiotic therapy for his diabetic foot infection. I am targeting the enterococcus and Escherichia coli isolated from his wound culture one month ago as well as anaerobes. I will plan on at least 6 weeks of antibiotic therapy. I will repeat his inflammatory markers today and see him back in 4 weeks.      Relevant Medications   lisinopril (PRINIVIL,ZESTRIL) 40 MG tablet    Other Relevant Orders   C-reactive protein   Sedimentation rate       Willie Bickers, MD [facility] [place] [phone] pager   [phone] cell 03/17/2016, 3:14 PM

## 2016-03-17 NOTE — Assessment & Plan Note (Signed)
His foot is looking a little bit better since switching back to broad IV antibiotic therapy for his diabetic foot infection. I am targeting the enterococcus and Escherichia coli isolated from his wound culture one month ago as well as anaerobes. I will plan on at least 6 weeks of antibiotic therapy. I will repeat his inflammatory markers today and see him back in 4 weeks.

## 2016-03-18 LAB — SEDIMENTATION RATE: Sed Rate: 6 mm/h (ref 0–20)

## 2016-03-18 NOTE — Progress Notes (Signed)
Willie, Krueger ([id number]) Visit Report for 03/17/2016 Arrival Information Details Patient Name: Willie Krueger, Willie Krueger. Date of Service: 03/17/2016 12:45 PM Medical Record Number: [id number] Patient Account Number: [id number] Date of Birth/Sex: 10/27/1956 (60 y.o. Male) Treating RN: Macarthur Critchley Primary Care Physician: Juanell Fairly Other Clinician: Referring Physician: Juanell Fairly Treating Physician/Extender: Frann Rider in Treatment: 13 Visit Information History Since Last Visit All ordered tests and consults were No Patient Arrived: Ambulatory completed: Arrival Time: 12:46 Added or deleted any medications: No Accompanied By: self Any new allergies or adverse reactions: No Transfer Assistance: None Had a fall or experienced change in No Patient Identification Verified: Yes activities of daily living that may affect Secondary Verification Process Yes risk of falls: Completed: Signs or symptoms of abuse/neglect since No Patient Requires Transmission- No last visito Based Precautions: Has Dressing in Place as Prescribed: Yes Patient Has Alerts: Yes Has Footwear/Offloading in Place as Yes Patient Alerts: Patient on Blood Prescribed: Thinner Left: Other:frontloader Aspirin, Dm2 Pain Present Now: No Electronic Signature(s) Signed: 03/17/2016 4:16:25 PM By: [name], RN, [name] Entered By: [name] RN, [name] on 03/17/2016 12:47:18 [name] ([id number]) -------------------------------------------------------------------------------- Clinic Level of Care Assessment Details Patient Name: [name], Krueger. Date of Service: 03/17/2016 12:45 PM Medical Record Number: [id number] Patient Account Number: [id number] Date of Birth/Sex: [birth year] (60 y.o. Male) Treating RN: Macarthur Critchley Primary Care Physician: Juanell Fairly Other Clinician: Referring Physician: Juanell Fairly Treating Physician/Extender: Frann Rider in Treatment: 13 Clinic Level of Care  Assessment Items TOOL 4 Quantity Score X - Use when only an EandM is performed on FOLLOW-UP visit 1 0 ASSESSMENTS - Nursing Assessment / Reassessment X - Reassessment of Co-morbidities (includes updates in patient status) 1 10 X - Reassessment of Adherence to Treatment Plan 1 5 ASSESSMENTS - Wound and Skin Assessment / Reassessment X - Simple Wound Assessment / Reassessment - one wound 1 5 []  - Complex Wound Assessment / Reassessment - multiple wounds 0 []  - Dermatologic / Skin Assessment (not related to wound area) 0 ASSESSMENTS - Focused Assessment []  - Circumferential Edema Measurements - multi extremities 0 []  - Nutritional Assessment / Counseling / Intervention 0 X - Lower Extremity Assessment (monofilament, tuning fork, pulses) 1 5 []  - Peripheral Arterial Disease Assessment (using hand held doppler) 0 ASSESSMENTS - Ostomy and/or Continence Assessment and Care []  - Incontinence Assessment and Management 0 []  - Ostomy Care Assessment and Management (repouching, etc.) 0 PROCESS - Coordination of Care X - Simple Patient / Family Education for ongoing care 1 15 []  - Complex (extensive) Patient / Family Education for ongoing care 0 X - Staff obtains Programmer, systems, Records, Test Results / Process Orders 1 10 []  - Staff telephones HHA, Nursing Homes / Clarify orders / etc 0 []  - Routine Transfer to another Facility (non-emergent condition) 0 Willie, Krueger ([id number]) []  - Routine Hospital Admission (non-emergent condition) 0 []  - New Admissions / Biomedical engineer / Ordering NPWT, Apligraf, etc. 0 []  - Emergency Hospital Admission (emergent condition) 0 X - Simple Discharge Coordination 1 10 []  - Complex (extensive) Discharge Coordination 0 PROCESS - Special Needs []  - Pediatric / Minor Patient Management 0 []  - Isolation Patient Management 0 []  - Hearing / Language / Visual special needs 0 []  - Assessment of Community assistance (transportation, D/C planning, etc.) 0 []  -  Additional assistance / Altered mentation 0 []  - Support Surface(s) Assessment (bed, cushion, seat, etc.) 0 INTERVENTIONS - Wound Cleansing / Measurement X - Simple Wound Cleansing - one wound  1 5 []  - Complex Wound Cleansing - multiple wounds 0 X - Wound Imaging (photographs - any number of wounds) 1 5 []  - Wound Tracing (instead of photographs) 0 X - Simple Wound Measurement - one wound 1 5 []  - Complex Wound Measurement - multiple wounds 0 INTERVENTIONS - Wound Dressings X - Small Wound Dressing one or multiple wounds 1 10 []  - Medium Wound Dressing one or multiple wounds 0 []  - Large Wound Dressing one or multiple wounds 0 []  - Application of Medications - topical 0 []  - Application of Medications - injection 0 INTERVENTIONS - Miscellaneous []  - External ear exam 0 Willie, Krueger ([id number]) []  - Specimen Collection (cultures, biopsies, blood, body fluids, etc.) 0 []  - Specimen(s) / Culture(s) sent or taken to Lab for analysis 0 []  - Patient Transfer (multiple staff / Harrel Lemon Lift / Similar devices) 0 []  - Simple Staple / Suture removal (25 or less) 0 []  - Complex Staple / Suture removal (26 or more) 0 []  - Hypo / Hyperglycemic Management (close monitor of Blood Glucose) 0 []  - Ankle / Brachial Index (ABI) - do not check if billed separately 0 X - Vital Signs 1 5 Has the patient been seen at the hospital within the last three years: Yes Total Score: 90 Level Of Care: New/Established - Level 3 Electronic Signature(s) Signed: 03/17/2016 4:16:25 PM By: [name], RN, [name] Entered By: [name] RN, [name] on 03/17/2016 13:08:01 [name] ([id number]) -------------------------------------------------------------------------------- Complex / Palliative Patient Assessment Details Patient Name: Willie, Krueger. Date of Service: 03/17/2016 12:45 PM Medical Record Number: [id number] Patient Account Number: [id number] Date of Birth/Sex: Apr 10, 1956 (60 y.o. Male) Treating RN:  Macarthur Critchley Primary Care Physician: Juanell Fairly Other Clinician: Referring Physician: Juanell Fairly Treating Physician/Extender: Frann Rider in Treatment: 13 Palliative Management Criteria Complex Wound Management Criteria Patient is unable to adhere to an advanced wound management plan. o Patient Care Contract has been implemented. Patient demonstrates continued inability to adhere to an advanced wound management plan, the patient has been presented with the option of continuing the advanced plan or implementing the Complex Wound Management Plan. o Patient or healthcare surrogate must agree to Complex Wound Management Plan. Care Approach Wound Care Plan: Complex Wound Management Notes patient is non-compliant, misses visits, does not follow through with recommended therapies and referrals. Patient has been educated on importance of wound care and the importance of. Refusing to consider HBO at this time. Electronic Signature(s) Signed: 03/17/2016 4:16:25 PM By: Ardean Larsen Signed: 03/17/2016 4:23:25 PM By: Christin Fudge MD, FACS Entered By: [name] RN, [name] on 03/17/2016 14:03:09 DREVION, PINALES ([id number]) -------------------------------------------------------------------------------- Encounter Discharge Information Details Patient Name: ANJEL, GRAUL. Date of Service: 03/17/2016 12:45 PM Medical Record Number: [id number] Patient Account Number: [id number] Date of Birth/Sex: [birth year] (60 y.o. Male) Treating RN: Macarthur Critchley Primary Care Physician: Juanell Fairly Other Clinician: Referring Physician: Juanell Fairly Treating Physician/Extender: Frann Rider in Treatment: 37 Encounter Discharge Information Items Discharge Pain Level: 0 Discharge Condition: Stable Ambulatory Status: Ambulatory Discharge Destination: Home Transportation: Private Auto Accompanied By: self Schedule Follow-up Appointment: Yes Medication Reconciliation  completed and provided to Patient/Care Yes Minnetta Sandora: Provided on Clinical Summary of Care: 03/17/2016 Form Type Recipient Paper Patient GM Electronic Signature(s) Signed: 03/17/2016 1:14:17 PM By: Ruthine Dose Entered By: Ruthine Dose on 03/17/2016 13:14:17 [name] ([id number]) -------------------------------------------------------------------------------- General Visit Notes Details Patient Name: MALAHKI, STJUSTE. Date of Service: 03/17/2016 12:45 PM Medical Record Number: [id number] Patient Account Number: [id number]  Date of Birth/Sex: 05/15/1956 (60 y.o. Male) Treating RN: Macarthur Critchley Primary Care Physician: Juanell Fairly Other Clinician: Referring Physician: Juanell Fairly Treating Physician/Extender: Frann Rider in Treatment: 28 Notes Spoke with patient at length about proper foot care and wound care instructions. Patient also educated on the importance of pursing other therapies in a full effort of saving his toe. 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 Did present to clinic today with PICC line to right arm and is administering self IV antibiotics. Clarification of doctors was also discussed by Dr. Con Memos, patient had seen a Dr Earleen Newport, vascular while at hospital getting PICC but needs to follow up with Dr Celesta Gentile, podiatry, which patient agrees to make appointment with. Electronic Signature(s) Signed: 03/17/2016 4:16:25 PM By: [name] RN, [name] Entered By: [name] RN, [name] on 03/17/2016 13:24:49 GLENNIE, SENICK ([id number]) -------------------------------------------------------------------------------- Lower Extremity Assessment Details Patient Name: AMEDEE, AMOAH. Date of Service: 03/17/2016 12:45 PM Medical Record Number: [id number] Patient Account Number: [id number] Date of Birth/Sex: 1956/10/19 (60 y.o. Male) Treating RN: Macarthur Critchley Primary Care Physician: Juanell Fairly Other Clinician: Referring Physician: Juanell Fairly Treating Physician/Extender: Frann Rider in Treatment: 13 Edema Assessment Assessed: [Left: No] [Right: No] Edema: [Left: Ye] [Right: s] Vascular Assessment Pulses: Posterior Tibial Dorsalis Pedis Palpable: [Right:Yes] Extremity colors, hair growth, and conditions: Extremity Color: [Right:Normal] Hair Growth on Extremity: [Right:Yes] Temperature of Extremity: [Right:Warm] Capillary Refill: [Right:< 3 seconds] Toe Nail Assessment Left: Right: Thick: Yes Discolored: No Deformed: No Improper Length and Hygiene: No Electronic Signature(s) Signed: 03/17/2016 4:16:25 PM By: [name], RN, [name] Entered By: [name] RN, [name] on 03/17/2016 12:49:46 JABES, BROTHERSON ([id number]) -------------------------------------------------------------------------------- Pain Assessment Details Patient Name: ULYSSES, TANSIL. Date of Service: 03/17/2016 12:45 PM Medical Record Number: [id number] Patient Account Number: [id number] Date of Birth/Sex: 05-01-1956 (60 y.o. Male) Treating RN: Macarthur Critchley Primary Care Physician: Juanell Fairly Other Clinician: Referring Physician: Juanell Fairly Treating Physician/Extender: Frann Rider in Treatment: 13 Active Problems Location of Pain Severity and Description of Pain Patient Has Paino No Site Locations Rate the pain. Current Pain Level: 0 Pain Management and Medication Current Pain Management: Electronic Signature(s) Signed: 03/17/2016 4:16:25 PM By: [name], RN, [name] Entered By: [name] RN, [name] on 03/17/2016 12:49:03 RONNAL, STOOPS ([id number]) -------------------------------------------------------------------------------- Patient/Caregiver Education Details Patient Name: KAYVION, SAVIN. Date of Service: 03/17/2016 12:45 PM Medical Record Number: [id number] Patient Account Number: [id number] Date of Birth/Gender: 25-Oct-1956  (60 y.o. Male) Treating RN: Macarthur Critchley Primary Care Physician: Juanell Fairly Other Clinician: Referring Physician: Juanell Fairly Treating Physician/Extender: Frann Rider in Treatment: 13 Education Assessment Education Provided To: Patient Education Topics Provided Infection: Handouts: Infection Prevention and Management, Other: IV antibiotic therapy Methods: Explain/Verbal Responses: State content correctly Wound/Skin Impairment: Handouts: Caring for Your Ulcer, Skin Care Do's and Dont's Methods: Explain/Verbal Responses: State content correctly Electronic Signature(s) Signed: 03/17/2016 4:16:25 PM By: [name], RN, [name] Entered By: [name] RN, [name] on 03/17/2016 12:58:16 MARQUARIUS, OBIER ([id number]) -------------------------------------------------------------------------------- Wound Assessment Details Patient Name: GILLIAN, HALPERT. Date of Service: 03/17/2016 12:45 PM Medical Record Number: [id number] Patient Account Number: [id number] Date of Birth/Sex: 01/23/1956 (60 y.o. Male) Treating RN: Macarthur Critchley Primary Care Physician: Juanell Fairly Other Clinician: Referring Physician: Juanell Fairly Treating Physician/Extender: Frann Rider in Treatment: 13 Wound Status Wound Number: 1 Primary Diabetic Wound/Ulcer of the Lower Etiology: Extremity Wound Location: Right Toe Great Wound Open  Wounding Event: Gradually Appeared Status: Date Acquired: 07/02/2015 Comorbid Anemia, Asthma, Hypertension, Type Weeks Of Treatment: 13 History: II Diabetes, Neuropathy Clustered Wound: No Photos Photo Uploaded By: [name] RN, [name] on 03/17/2016 13:50:33 Wound Measurements Length: (cm) 0.3 Width: (cm) 0.3 Depth: (cm) 0.6 Area: (cm) 0.071 Volume: (cm) 0.042 % Reduction in Area: 87.1% % Reduction in Volume: 23.6% Wound Description Classification: Grade 2 Wound Margin: Thickened Exudate Amount: Medium Exudate Type: Serosanguineous Exudate Color: red,  brown Foul Odor After Cleansing: No Wound Bed Exposed Structure Bone Exposed: Yes Periwound Skin Texture Texture Color QUINDARIUS, PLOURDE ([id number]) No Abnormalities Noted: No No Abnormalities Noted: No Callus: Yes Temperature / Pain Moisture Tenderness on Palpation: Yes No Abnormalities Noted: No Dry / Scaly: Yes Wound Preparation Ulcer Cleansing: Rinsed/Irrigated with Saline Topical Anesthetic Applied: Other: lidocaine 4%, Assessment Notes probes down to bone Treatment Notes Wound #1 (Right Toe Great) 1. Cleansed with: Clean wound with Normal Saline 2. Anesthetic Topical Lidocaine 4% cream to wound bed prior to debridement 4. Dressing Applied: Aquacel Ag 5. Secondary Dressing Applied Gauze and Kerlix/Conform Notes patient to make appointment to follow up with dr Celesta Gentile, podiatry Electronic Signature(s) Signed: 03/17/2016 4:16:25 PM By: [name] RN, [name] Entered By: [name] RN, [name] on 03/17/2016 13:15:03 BRAYDIN, FLAMAND ([id number]) -------------------------------------------------------------------------------- [place] Details Patient Name: QUENTYN, CHEVALIER. Date of Service: 03/17/2016 12:45 PM Medical Record Number: [id number] Patient Account Number: [id number] Date of Birth/Sex: 03/07/1956 (60 y.o. Male) Treating RN: Macarthur Critchley Primary Care Physician: Juanell Fairly Other Clinician: Referring Physician: Juanell Fairly Treating Physician/Extender: Frann Rider in Treatment: 13 Vital Signs Time Taken: 12:46 Pulse (bpm): 82 Height (in): 74 Blood Pressure (mmHg): 152/78 Weight (lbs): 242 Reference Range: 80 - 120 mg / dl Body Mass Index (BMI): 31.1 Electronic Signature(s) Signed: 03/17/2016 4:16:25 PM By: [name] RN, [name] Entered By: [name] RN, [name] on 03/17/2016 12:54:37

## 2016-03-18 NOTE — Progress Notes (Signed)
Willie Krueger ([id number]) Visit Report for 03/17/2016 Chief Complaint Document Details Patient Name: Willie Krueger, Willie Krueger. Date of Service: 03/17/2016 12:45 PM Medical Record Number: [id number] Patient Account Number: [id number] Date of Birth/Sex: 02/14/1956 (60 y.o. Male) Treating RN: Macarthur Critchley Primary Care Physician: Juanell Fairly Other Clinician: Referring Physician: Juanell Fairly Treating Physician/Extender: Frann Rider in Treatment: 13 Information Obtained from: Patient Chief Complaint Patients presents for treatment of an open diabetic ulcer to his right big toe which she's had for several months Electronic Signature(s) Signed: 03/17/2016 1:36:54 PM By: Christin Fudge MD, FACS Entered By: Christin Fudge on 03/17/2016 13:36:54 Willie Krueger ([id number]) -------------------------------------------------------------------------------- HPI Details Patient Name: Willie Krueger, Willie Krueger. Date of Service: 03/17/2016 12:45 PM Medical Record Number: [id number] Patient Account Number: [id number] Date of Birth/Sex: [birth year] (60 y.o. Male) Treating RN: Macarthur Critchley Primary Care Physician: Juanell Fairly Other Clinician: Referring Physician: Juanell Fairly Treating Physician/Extender: Frann Rider in Treatment: 13 History of Present Illness Location: ulcerated area on his right big toe Quality: Patient reports No Pain. Severity: Patient states wound are getting worse. Duration: Patient has had the wound for > 3 months prior to seeking treatment at the wound center Timing: Pain in wound is Intermittent (comes and goes Context: The wound appeared gradually over time Modifying Factors: Consults to this date include: infectious disease who has put him on IV antibiotics with a PICC line Associated Signs and Symptoms: Patient reports having difficulty standing for long periods. HPI Description: 60 year old gentleman with multiple medical problems with a past medical history of diabetes  mellitus, hypertension, neuropathy, chronic pain and nicotine abuse. he was recently seen by Dr. Vincenza Hews of infectious disease at [facility] for a osteomyelitis of the distal phalanx of his right great toe. He also had diffuse cellulitis of the right lower leg. He has been advised to have a PICC line and will receive IV vancomycin and oral ciprofloxacin plus oral metronidazole. he had an MRI which was showing edema of the distal phalanx with probably represent osteomyelitis. He smokes a pack and a half of cigarettes a day. his last hemoglobin A1c was 8% A venous duplex of the right lower extremity was done recently on 11/30/2015 and this showed no evidence of DVT, superficial thrombophlebitis or incompetence. 01/11/2016 -- the patient only comes here once every 2 weeks and says he has several appointments and other issues which prevented him from coming for wound care. He has declined hyperbaric oxygen therapy, declined to stop smoking and has declined to see a surgeon regarding options for his toe. 02/18/2016 -- the patient has not seen [place] for the last 5 weeks and has several excuses for not being here. In the meanwhile he says his PICC line was removed and he is on no antibiotics and his ID specialist Dr. Vincenza Hews told him that his infection was all gone. The patient continues to smoke, says his diabetes is under good control and he has not been doing any specific dressing to his right big toe. I have reviewed Dr. Hale Bogus note from 01/23/2016 -- he had completed 45 days of IV antibiotic with vancomycin, oral ciprofloxacin and oral metronidazole for the right great toe osteomyelitis. He thought that clinically the osteomyelitis has been cured and he stopped his IV antibiotics and had the PICC line removed. Follow-up in 6 weeks was recommended. I have just spoken to Dr. Megan Salon just now and he will call for the patient to review the cultures from today and put him on appropriate  antibiotic. 02/25/2016 -- tissue culture report grew heavy growth of Escherichia coli which is sensitive to Kefzol and, cefepime, ceftriaxone, gentamicin, imipenem, Bactrim and Zosyn. I had spoken to Dr. Megan Salon over the phone and discussed the report with him and he is going to call the patient and put him on an appropriate antibiotic. I have reviewed Dr. Hale Bogus note on the electronic medical record -- foot cultures have grown [place] ([id number]) Escherichia coli and Enterococcus casselflavus. He decide to treat him with cefadroxil and ampicillin sulbactam. 03/03/2016 -- over the last few days his leg has swollen up and his foot has turned red and his toe is discharging purulent material. He hasn't been on double oral antibiotic coverage for the last 8 days. Recent Right foot x-ray compared to a previous MRI shows soft tissue ulceration distal aspect of the right great toe with no acute erosive bony abnormalities or foreign body. After completing his examination and noting that he has significant cellulitis, purulent discharge and ulceration down probing to bone I spoke to his infectious disease specialist Dr. Vincenza Hews who agreed with me that the patient needs admission to the hospital, appropriate IV antibiotics, debridement by Dr. Celesta Gentile and to see me in follow-up. I have made arrangements for the patient to be admitted by the ER 03/17/2016 -- he did report to the ER and they tried to admit but he declined overnight hospital stay. Dr. Megan Salon gave instructions for a PICC line to be placed and the patient signed out [place]. He was given a dose of IV Zosyn in the ER prior to discharge. Dr. Megan Salon also asked him to follow-up with his podiatrist Dr. Celesta Gentile. Reviewing the electronic medical records I note that on 03/10/2016 he had a right arm PICC line placed without a problem. Dr. Vincenza Hews has placed him on appropriate antibiotics o Ertapenem 1 g daily.  He has not yet seen Dr. Celesta Gentile. Electronic Signature(s) Signed: 03/17/2016 1:37:41 PM By: Christin Fudge MD, FACS Entered By: Christin Fudge on 03/17/2016 13:37:41 Willie Krueger, Willie Krueger ([id number]) -------------------------------------------------------------------------------- Physical Exam Details Patient Name: Willie Krueger, Willie Krueger. Date of Service: 03/17/2016 12:45 PM Medical Record Number: [id number] Patient Account Number: [id number] Date of Birth/Sex: Mar 17, 1956 (60 y.o. Male) Treating RN: Macarthur Critchley Primary Care Physician: Juanell Fairly Other Clinician: Referring Physician: Juanell Fairly Treating Physician/Extender: Frann Rider in Treatment: 13 Constitutional . Pulse regular. Respirations normal and unlabored. Afebrile. . Eyes Nonicteric. Reactive to light. Ears, Nose, Mouth, and Throat Lips, teeth, and gums WNL.Marland Kitchen Moist mucosa without lesions. Neck supple and nontender. No palpable supraclavicular or cervical adenopathy. Normal sized without goiter. Respiratory WNL. No retractions.. Cardiovascular Pedal Pulses WNL. No clubbing, cyanosis or edema. Lymphatic No adneopathy. No adenopathy. No adenopathy. Musculoskeletal Adexa without tenderness or enlargement.. Digits and nails w/o clubbing, cyanosis, infection, petechiae, ischemia, or inflammatory conditions.. Integumentary (Hair, Skin) No suspicious lesions. No crepitus or fluctuance. No peri-wound warmth or erythema. No masses.Marland Kitchen Psychiatric Judgement and insight Intact.. No evidence of depression, anxiety, or agitation.. Notes the cellulitis has gone down significantly but he continues to have purulent discharge from the wound and has again formed a lot of callus and the wound probes down to bone with brisk bleeding. No debridement was done today. Electronic Signature(s) Signed: 03/17/2016 1:38:23 PM By: Christin Fudge MD, FACS Entered By: Christin Fudge on 03/17/2016 13:38:23 Willie Krueger, Willie Krueger  ([id number]) -------------------------------------------------------------------------------- Physician Orders Details Patient Name: Willie Krueger, Willie Krueger. Date of Service: 03/17/2016 12:45 PM Medical Record Number: [id number] Patient  Account Number: [id number] Date of Birth/Sex: February 28, 1956 (60 y.o. Male) Treating RN: Macarthur Critchley Primary Care Physician: Juanell Fairly Other Clinician: Referring Physician: Juanell Fairly Treating Physician/Extender: Frann Rider in Treatment: 68 Verbal / Phone Orders: Yes Clinician: Macarthur Critchley Read Back and Verified: Yes Diagnosis Coding Wound Cleansing Wound #1 Right Toe Great o Cleanse wound with mild soap and water o May Shower, gently pat wound dry prior to applying new dressing. Primary Wound Dressing o Aquacel Ag Secondary Dressing Wound #1 Right Toe Great o Gauze and Kerlix/Conform Dressing Change Frequency Wound #1 Right Toe Great o Change dressing every other day. Follow-up Appointments Wound #1 Right Toe Great o Return Appointment in 1 week. Off-Loading Wound #1 Right Toe Great o Other: - darco shoe Additional Orders / Instructions Wound #1 Right Toe Great o Stop Smoking o Other: - follow up with Dr Celesta Gentile, podiatry Electronic Signature(s) Signed: 03/17/2016 4:16:25 PM By: Ardean Larsen Signed: 03/17/2016 4:23:25 PM By: Christin Fudge MD, FACS Entered By: [name] RN, [name] on 03/17/2016 13:25:36 SIDAK, GERE ([id number]) MYERS, BOTZ ([id number]) -------------------------------------------------------------------------------- Problem List Details Patient Name: Willie Krueger, Willie Krueger. Date of Service: 03/17/2016 12:45 PM Medical Record Number: [id number] Patient Account Number: [id number] Date of Birth/Sex: 05/16/1956 (60 y.o. 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 Date of Service: 03/17/2016 12:45 PM Medical Record Number: [id number] Patient Account Number: [id number] Date of Birth/Sex: 29-Dec-1956 (60 y.o. Male) Treating RN: Macarthur Critchley Primary Care Physician: Juanell Fairly Other Clinician: Referring Physician: Juanell Fairly Treating Physician/Extender: Frann Rider in Treatment: 13 Subjective Chief Complaint Information obtained from Patient Patients presents for treatment of an open diabetic ulcer to his right big toe which she's had for several months History of Present Illness (HPI) The following HPI elements were documented for the patient's wound: Location: ulcerated area on his right big toe Quality: Patient reports No Pain. Severity: Patient states wound are getting worse. Duration: Patient has had the wound for > 3 months prior to seeking treatment at the wound center Timing: Pain in wound is Intermittent (comes and goes Context: The wound appeared gradually over time Modifying Factors: Consults to this date include: infectious disease who has put him on IV antibiotics with a PICC line Associated Signs and Symptoms: Patient  reports having difficulty standing for long periods. 60 year old gentleman with multiple medical problems with a past medical history of diabetes mellitus, hypertension, neuropathy, chronic pain and nicotine abuse. he was recently seen by Dr. Vincenza Hews of infectious disease at [facility] for a osteomyelitis of the distal phalanx of his right great toe. He also had diffuse cellulitis of the right lower leg. He has been advised to have a PICC line and will receive IV vancomycin and oral ciprofloxacin plus oral metronidazole. he had an MRI which was showing edema of the distal phalanx with  probably represent osteomyelitis. He smokes a pack and a half of cigarettes a day. his last hemoglobin A1c was 8% A venous duplex of the right lower extremity was done recently on 11/30/2015 and this showed no evidence of DVT, superficial thrombophlebitis or incompetence. 01/11/2016 -- the patient only comes here once every 2 weeks and says he has several appointments and other issues which prevented him from coming for wound care. He has declined hyperbaric oxygen therapy, declined to stop smoking and has declined to see a surgeon regarding options for his toe. 02/18/2016 -- the patient has not seen [place] for the last 5 weeks and has several excuses for not being here. In the meanwhile he says his PICC line was removed and he is on no antibiotics and his ID specialist Dr. Vincenza Hews told him that his infection was all gone. The patient continues to smoke, says his diabetes is under good control and he has not been doing any specific dressing to his right big toe. I have reviewed Dr. Hale Bogus note from 01/23/2016 -- he had completed 45 days of IV antibiotic with vancomycin, oral ciprofloxacin and oral metronidazole for the right great toe osteomyelitis. Willie Krueger, Willie Krueger [place]. ([id number]) He thought that clinically the osteomyelitis has been cured and he stopped his IV antibiotics and had the PICC line removed.  Follow-up in 6 weeks was recommended. I have just spoken to Dr. Megan Salon just now and he will call for the patient to review the cultures from today and put him on appropriate antibiotic. 02/25/2016 -- tissue culture report grew heavy growth of Escherichia coli which is sensitive to Kefzol and, cefepime, ceftriaxone, gentamicin, imipenem, Bactrim and Zosyn. I had spoken to Dr. Megan Salon over the phone and discussed the report with him and he is going to call the patient and put him on an appropriate antibiotic. I have reviewed Dr. Hale Bogus note on the electronic medical record -- foot cultures have grown Escherichia coli and Enterococcus casselflavus. He decide to treat him with cefadroxil and ampicillin sulbactam. 03/03/2016 -- over the last few days his leg has swollen up and his foot has turned red and his toe is discharging purulent material. He hasn't been on double oral antibiotic coverage for the last 8 days. Recent Right foot x-ray compared to a previous MRI shows soft tissue ulceration distal aspect of the right great toe with no acute erosive bony abnormalities or foreign body. After completing his examination and noting that he has significant cellulitis, purulent discharge and ulceration down probing to bone I spoke to his infectious disease specialist Dr. Vincenza Hews who agreed with me that the patient needs admission to the hospital, appropriate IV antibiotics, debridement by Dr. Celesta Gentile and to see me in follow-up. I have made arrangements for the patient to be admitted by the ER 03/17/2016 -- he did report to the ER and they tried to admit but he declined overnight hospital stay. Dr. Megan Salon gave instructions for a PICC line to be placed and the patient signed out [place]. He was given a dose of IV Zosyn in the ER prior to discharge. Dr. Megan Salon also asked him to follow-up with his podiatrist Dr. Celesta Gentile. Reviewing the electronic medical records I note that on  03/10/2016 he had a right arm PICC line placed without a problem. Dr. Vincenza Hews has placed him on appropriate antibiotics Ertapenem 1 g daily. He has not yet seen Dr. Celesta Gentile. Objective Constitutional Pulse regular. Respirations normal and unlabored. Afebrile. Vitals Time Taken:  12:46 PM, Height: 74 in, Weight: 242 lbs, BMI: 31.1, Pulse: 82 bpm, Blood Pressure: 152/78 mmHg. Eyes Nonicteric. Reactive to light. Ears, Nose, Mouth, and Throat Lips, teeth, and gums WNL.Marland Kitchen Moist mucosa without lesions. Willie Krueger, Willie Krueger [place]. ([id number]) Neck supple and nontender. No palpable supraclavicular or cervical adenopathy. Normal sized without goiter. Respiratory WNL. No retractions.. Cardiovascular Pedal Pulses WNL. No clubbing, cyanosis or edema. Lymphatic No adneopathy. No adenopathy. No adenopathy. Musculoskeletal Adexa without tenderness or enlargement.. Digits and nails w/o clubbing, cyanosis, infection, petechiae, ischemia, or inflammatory conditions.Marland Kitchen Psychiatric Judgement and insight Intact.. No evidence of depression, anxiety, or agitation.. General Notes: the cellulitis has gone down significantly but he continues to have purulent discharge from the wound and has again formed a lot of callus and the wound probes down to bone with brisk bleeding. No debridement was done today. Integumentary (Hair, Skin) No suspicious lesions. No crepitus or fluctuance. No peri-wound warmth or erythema. No masses.. Wound #1 status is Open. Original cause of wound was Gradually Appeared. The wound is located on the Right Toe Great. The wound measures 0.3cm length x 0.3cm width x 0.6cm depth; 0.071cm^2 area and 0.042cm^3 volume. There is bone exposed. There is a medium amount of serosanguineous drainage noted. The wound margin is thickened. The periwound skin appearance exhibited: Callus, Dry/Scaly. The periwound has tenderness on palpation. 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 in the meanwhile I have recommended dressing the wound and packing it with silver alginate and offloading it with a front offloading shoe He has a PICC line placed and will follow-up with Dr. Megan Salon this afternoon. I have reiterated in no uncertain terms the fact that he needs debridement by Dr. Earleen Newport and also would benefit immensely from hyperbaric oxygen therapy and have given him all the options. Says he will think about it and let us know. Plan Wound Cleansing: Wound #1 Right Toe Great: Cleanse wound with mild soap and water May Shower, gently pat wound dry prior to applying new dressing. Primary Wound Dressing: Aquacel Ag Secondary Dressing: Wound #1 Right Toe Great: Gauze and Kerlix/Conform Dressing Change Frequency: Wound #1 Right Toe Great: Change dressing every other day. Follow-up Appointments: Wound #1 Right Toe Great: Return Appointment in 1 week. Off-Loading: Wound #1 Right Toe Great: Other: - darco shoe Additional Orders / Instructions: Wound #1 Right Toe Great: Stop Smoking Other: - follow up with Dr Celesta Gentile, podiatry This gentleman who has a Earleen Newport 3 diabetic foot ulceration with osteomyelitis of his bone has been rather noncompliant and will not agree to hyperbaric oxygen therapy or a surgical debridement. in the meanwhile I have recommended dressing the wound and packing it with silver alginate and  offloading it with a front offloading shoe EHITAN, GRIEGO. ([id number]) He has a PICC line placed and will follow-up with Dr. Megan Salon this afternoon. I have reiterated in no uncertain terms the fact that he needs debridement by Dr. Earleen Newport and also would benefit immensely from hyperbaric oxygen therapy and have given him  all the options. Says he will think about it and let us know. Electronic Signature(s) Signed: 03/17/2016 1:40:27 PM By: Christin Fudge MD, FACS Entered By: Christin Fudge on 03/17/2016 13:40:27 KEVONTAE, SILVERIA ([id number]) -------------------------------------------------------------------------------- SuperBill Details Patient Name: KYLYN, WANTZ. Date of Service: 03/17/2016 Medical Record Number: [id number] Patient Account Number: [id number] Date of Birth/Sex: [birth year] (60 y.o. Male) Treating RN: Macarthur Critchley Primary Care Physician: Juanell Fairly Other Clinician: Referring Physician: Juanell Fairly Treating Physician/Extender: Frann Rider in Treatment: 13 Diagnosis Coding ICD-10 Codes Code Description E11.621 Type 2 diabetes mellitus with foot ulcer L97.514 Non-pressure chronic ulcer of other part of right foot with necrosis of bone M86.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, right ankle and foot F17.218 Nicotine dependence, cigarettes, with other nicotine-induced disorders L84 Corns and callosities Facility Procedures CPT4 Code: [id number] Description: 99213 - WOUND CARE VISIT-LEV 3 EST PT Modifier: Quantity: 1 Physician Procedures CPT4 Code Description: [id number] - WC PHYS LEVEL 3 - EST PT ICD-10 Description Diagnosis E11.621 Type 2 diabetes mellitus with foot ulcer L97.514 Non-pressure chronic ulcer of other part of right f M86.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, right  ankle and f F17.218 Nicotine dependence, cigarettes, with other nicotin Modifier: oot with necros oot e-induced disor Quantity: 1 is of bone ders Electronic Signature(s) Signed:  03/17/2016 1:40:54 PM By: Christin Fudge MD, FACS Entered By: Christin Fudge on 03/17/2016 13:40:54

## 2016-03-19 ENCOUNTER — Ambulatory Visit: Payer: 59 | Admitting: Internal Medicine

## 2016-03-24 ENCOUNTER — Encounter: Payer: 59 | Admitting: Surgery

## 2016-03-24 DIAGNOSIS — E11621 Type 2 diabetes mellitus with foot ulcer: Secondary | ICD-10-CM | POA: Diagnosis not present

## 2016-03-25 NOTE — Progress Notes (Signed)
CAN, DETTMAN ([id number]) Visit Report for 03/24/2016 Chief Complaint Document Details Patient Name: Willie Krueger, Willie Krueger. Date of Service: 03/24/2016 1:30 PM Medical Record Number: [id number] Patient Account Number: [id number] Date of Birth/Sex: 1956-07-05 (60 y.o. Male) Treating RN: Macarthur Critchley Primary Care Physician: Juanell Fairly Other Clinician: Referring Physician: Juanell Fairly Treating Physician/Extender: Frann Rider in Treatment: 14 Information Obtained from: Patient Chief Complaint Patients presents for treatment of an open diabetic ulcer to his right big toe which she's had for several months Electronic Signature(s) Signed: 03/24/2016 2:40:25 PM By: Christin Fudge MD, FACS Entered By: Christin Fudge on 03/24/2016 14:40:25 COLBERT, BUREK ([id number]) -------------------------------------------------------------------------------- Debridement Details Patient Name: Willie Krueger. Date of Service: 03/24/2016 1:30 PM Medical Record Number: [id number] Patient Account Number: [id number] Date of Birth/Sex: [birth year] (60 y.o. Male) Treating RN: Macarthur Critchley Primary Care Physician: Juanell Fairly Other Clinician: Referring Physician: Juanell Fairly Treating Physician/Extender: Frann Rider in Treatment: 14 Debridement Performed for Wound #1 Right Toe Great Assessment: Performed By: Physician Christin Fudge, MD Debridement: Debridement Pre-procedure Yes Verification/Time Out Taken: Start Time: 14:25 Pain Control: Lidocaine 5% topical ointment Level: Skin/Subcutaneous Tissue Total Area Debrided (L x 0.3 (cm) x 0.3 (cm) = 0.09 (cm) W): Tissue and other Viable, Non-Viable, Callus, Fibrin/Slough, Subcutaneous material debrided: Instrument: Curette Bleeding: Minimum End Time: 14:33 Procedural Pain: 0 Post Procedural Pain: 0 Response to Treatment: Procedure was tolerated well Post Debridement Measurements of Total Wound Length: (cm) 1 Width: (cm) 1 Depth: (cm)  0.5 Volume: (cm) 0.393 Post Procedure Diagnosis Same as Pre-procedure Electronic Signature(s) Signed: 03/24/2016 2:40:19 PM By: Christin Fudge MD, FACS Signed: 03/24/2016 5:13:11 PM By: [name] RN, [name] Entered By: Christin Fudge on 03/24/2016 14:40:19 GIVANNI, BEHRINGER ([id number]) -------------------------------------------------------------------------------- HPI Details Patient Name: Willie Krueger. Date of Service: 03/24/2016 1:30 PM Medical Record Number: [id number] Patient Account Number: [id number] Date of Birth/Sex: [birth year] (60 y.o. Male) Treating RN: Macarthur Critchley Primary Care Physician: Juanell Fairly Other Clinician: Referring Physician: Juanell Fairly Treating Physician/Extender: Frann Rider in Treatment: 14 History of Present Illness Location: ulcerated area on his right big toe Quality: Patient reports No Pain. Severity: Patient states wound are getting worse. Duration: Patient has had the wound for > 3 months prior to seeking treatment at the wound center Timing: Pain in wound is Intermittent (comes and goes Context: The wound appeared gradually over time Modifying Factors: Consults to this date include: infectious disease who has put him on IV antibiotics with a PICC line Associated Signs and Symptoms: Patient reports having difficulty standing for long periods. HPI Description: 60 year old gentleman with multiple medical problems with a past medical history of diabetes mellitus, hypertension, neuropathy, chronic pain and nicotine abuse. he was recently seen by Dr. Vincenza Hews of infectious disease at [facility] for a osteomyelitis of the distal phalanx of his right great toe. He also had diffuse cellulitis of the right lower leg. He has been advised to have a PICC line and will receive IV vancomycin and oral ciprofloxacin plus oral metronidazole. he had an MRI which was showing edema of the distal phalanx with probably represent osteomyelitis. He smokes a  pack and a half of cigarettes a day. his last hemoglobin A1c was 8% A venous duplex of the right lower extremity was done recently on 11/30/2015 and this showed no evidence of DVT, superficial thrombophlebitis or incompetence. 01/11/2016 -- the patient only comes here once every 2 weeks and says he has several appointments and other issues which prevented him from coming for wound  care. He has declined hyperbaric oxygen therapy, declined to stop smoking and has declined to see a surgeon regarding options for his toe. 02/18/2016 -- the patient has not seen [place] for the last 5 weeks and has several excuses for not being here. In the meanwhile he says his PICC line was removed and he is on no antibiotics and his ID specialist Dr. Vincenza Hews told him that his infection was all gone. The patient continues to smoke, says his diabetes is under good control and he has not been doing any specific dressing to his right big toe. I have reviewed Dr. Hale Bogus note from 01/23/2016 -- he had completed 45 days of IV antibiotic with vancomycin, oral ciprofloxacin and oral metronidazole for the right great toe osteomyelitis. He thought that clinically the osteomyelitis has been cured and he stopped his IV antibiotics and had the PICC line removed. Follow-up in 6 weeks was recommended. I have just spoken to Dr. Megan Salon just now and he will call for the patient to review the cultures from today and put him on appropriate antibiotic. 02/25/2016 -- tissue culture report grew heavy growth of Escherichia coli which is sensitive to Kefzol and, cefepime, ceftriaxone, gentamicin, imipenem, Bactrim and Zosyn. I had spoken to Dr. Megan Salon over the phone and discussed the report with him and he is going to call the patient and put him on an appropriate antibiotic. I have reviewed Dr. Hale Bogus note on the electronic medical record -- foot cultures have grown [place] ([id number]) Escherichia coli and  Enterococcus casselflavus. He decide to treat him with cefadroxil and ampicillin sulbactam. 03/03/2016 -- over the last few days his leg has swollen up and his foot has turned red and his toe is discharging purulent material. He hasn't been on double oral antibiotic coverage for the last 8 days. Recent Right foot x-ray compared to a previous MRI shows soft tissue ulceration distal aspect of the right great toe with no acute erosive bony abnormalities or foreign body. After completing his examination and noting that he has significant cellulitis, purulent discharge and ulceration down probing to bone I spoke to his infectious disease specialist Dr. Vincenza Hews who agreed with me that the patient needs admission to the hospital, appropriate IV antibiotics, debridement by Dr. Celesta Gentile and to see me in follow-up. I have made arrangements for the patient to be admitted by the ER 03/17/2016 -- he did report to the ER and they tried to admit but he declined overnight hospital stay. Dr. Megan Salon gave instructions for a PICC line to be placed and the patient signed out [place]. He was given a dose of IV Zosyn in the ER prior to discharge. Dr. Megan Salon also asked him to follow-up with his podiatrist Dr. Celesta Gentile. Reviewing the electronic medical records I note that on 03/10/2016 he had a right arm PICC line placed without a problem. Dr. Vincenza Hews has placed him on appropriate antibiotics o Ertapenem 1 g daily. He has not yet seen Dr. Celesta Gentile. 03/24/2016 -- he continues with his IV antibiotics through his PICC line and has an appointment to see Dr. Samara Deist for a podiatry opinion. Electronic Signature(s) Signed: 03/24/2016 2:40:56 PM By: Christin Fudge MD, FACS Entered By: Christin Fudge on 03/24/2016 14:40:56 ROCKNEY, BALTHAZOR ([id number]) -------------------------------------------------------------------------------- Physical Exam Details Patient Name: JULYAN, MCKEAG. Date  of Service: 03/24/2016 1:30 PM Medical Record Number: [id number] Patient Account Number: [id number] Date of Birth/Sex: Jun 29, 1956 (60 y.o. Male) Treating RN: Macarthur Critchley Primary  Care Physician: Juanell Fairly Other Clinician: Referring Physician: Juanell Fairly Treating Physician/Extender: Frann Rider in Treatment: 14 Constitutional . Pulse regular. Respirations normal and unlabored. Afebrile. . Eyes Nonicteric. Reactive to light. Ears, Nose, Mouth, and Throat Lips, teeth, and gums WNL.Marland Kitchen Moist mucosa without lesions. Neck supple and nontender. No palpable supraclavicular or cervical adenopathy. Normal sized without goiter. Respiratory WNL. No retractions.. Cardiovascular Pedal Pulses WNL. No clubbing, cyanosis or edema. Lymphatic No adneopathy. No adenopathy. No adenopathy. Musculoskeletal Adexa without tenderness or enlargement.. Digits and nails w/o clubbing, cyanosis, infection, petechiae, ischemia, or inflammatory conditions.. Integumentary (Hair, Skin) No suspicious lesions. No crepitus or fluctuance. No peri-wound warmth or erythema. No masses.Marland Kitchen Psychiatric Judgement and insight Intact.. No evidence of depression, anxiety, or agitation.. Notes the discharge from the wound has gotten less because it is totally covered with callus and after sharp debridement down to the subcutaneous tissue I have not been able to probe down to bone Electronic Signature(s) Signed: 03/24/2016 2:41:38 PM By: Christin Fudge MD, FACS Entered By: Christin Fudge on 03/24/2016 14:41:37 TYRAN, CORDES ([id number]) -------------------------------------------------------------------------------- Physician Orders Details Patient Name: RAYDYN, HEWEY. Date of Service: 03/24/2016 1:30 PM Medical Record Number: [id number] Patient Account Number: [id number] Date of Birth/Sex: 1956-10-05 (60 y.o. Male) Treating RN: Macarthur Critchley Primary Care Physician: Juanell Fairly Other Clinician: Referring  Physician: Juanell Fairly Treating Physician/Extender: Frann Rider in Treatment: 34 Verbal / Phone Orders: Yes Clinician: Macarthur Critchley Read Back and Verified: Yes Diagnosis Coding Wound Cleansing Wound #1 Right Toe Great o Cleanse wound with mild soap and water o May Shower, gently pat wound dry prior to applying new dressing. Primary Wound Dressing o Aquacel Ag Secondary Dressing Wound #1 Right Toe Great o Gauze and Kerlix/Conform Dressing Change Frequency Wound #1 Right Toe Great o Change dressing every other day. Follow-up Appointments Wound #1 Right Toe Great o Return Appointment in 1 week. Off-Loading Wound #1 Right Toe Great o Other: - darco shoe Additional Orders / Instructions Wound #1 Right Toe Great o Stop Smoking o Other: - follow up with Dr Celesta Gentile, podiatry Electronic Signature(s) Signed: 03/24/2016 4:52:55 PM By: Christin Fudge MD, FACS Signed: 03/24/2016 5:13:11 PM By: [name] RN, [name] Entered By: [name] RN, [name] on 03/24/2016 14:39:19 KIARI, DENLEY ([id number]) LISANDRO, CURTISS ([id number]) -------------------------------------------------------------------------------- Progress Note Details Patient Name: CORBY, DEMIAN. Date of Service: 03/24/2016 1:30 PM Medical Record Number: [id number] Patient Account Number: [id number] Date of Birth/Sex: 03/03/1956 (60 y.o. Male) Treating RN: Macarthur Critchley Primary Care Physician: Juanell Fairly Other Clinician: Referring Physician: Juanell Fairly Treating Physician/Extender: Frann Rider in Treatment: 14 Subjective Chief Complaint Information obtained from Patient Patients presents for treatment of an open diabetic ulcer to his right big toe which she's had for several months History of Present Illness (HPI) The following HPI elements were documented for the patient's wound: Location: ulcerated area on his right big toe Quality: Patient reports No Pain. Severity:  Patient states wound are getting worse. Duration: Patient has had the wound for > 3 months prior to seeking treatment at the wound center Timing: Pain in wound is Intermittent (comes and goes Context: The wound appeared gradually over time Modifying Factors: Consults to this date include: infectious disease who has put him on IV antibiotics with a PICC line Associated Signs and Symptoms: Patient reports having difficulty standing for long periods. 60 year old gentleman with multiple medical problems with a past medical history of diabetes mellitus, hypertension, neuropathy, chronic pain and nicotine abuse. he was recently seen  by Dr. Vincenza Hews of infectious disease at [facility] for a osteomyelitis of the distal phalanx of his right great toe. He also had diffuse cellulitis of the right lower leg. He has been advised to have a PICC line and will receive IV vancomycin and oral ciprofloxacin plus oral metronidazole. he had an MRI which was showing edema of the distal phalanx with probably represent osteomyelitis. He smokes a pack and a half of cigarettes a day. his last hemoglobin A1c was 8% A venous duplex of the right lower extremity was done recently on 11/30/2015 and this showed no evidence of DVT, superficial thrombophlebitis or incompetence. 01/11/2016 -- the patient only comes here once every 2 weeks and says he has several appointments and other issues which prevented him from coming for wound care. He has declined hyperbaric oxygen therapy, declined to stop smoking and has declined to see a surgeon regarding options for his toe. 02/18/2016 -- the patient has not seen [place] for the last 5 weeks and has several excuses for not being here. In the meanwhile he says his PICC line was removed and he is on no antibiotics and his ID specialist Dr. Vincenza Hews told him that his infection was all gone. The patient continues to smoke, says his diabetes is under good control and he has not been  doing any specific dressing to his right big toe. I have reviewed Dr. Hale Bogus note from 01/23/2016 -- he had completed 45 days of IV antibiotic with vancomycin, oral ciprofloxacin and oral metronidazole for the right great toe osteomyelitis. MICHAELTHOMAS, STANSBERY [place]. ([id number]) He thought that clinically the osteomyelitis has been cured and he stopped his IV antibiotics and had the PICC line removed. Follow-up in 6 weeks was recommended. I have just spoken to Dr. Megan Salon just now and he will call for the patient to review the cultures from today and put him on appropriate antibiotic. 02/25/2016 -- tissue culture report grew heavy growth of Escherichia coli which is sensitive to Kefzol and, cefepime, ceftriaxone, gentamicin, imipenem, Bactrim and Zosyn. I had spoken to Dr. Megan Salon over the phone and discussed the report with him and he is going to call the patient and put him on an appropriate antibiotic. I have reviewed Dr. Hale Bogus note on the electronic medical record -- foot cultures have grown Escherichia coli and Enterococcus casselflavus. He decide to treat him with cefadroxil and ampicillin sulbactam. 03/03/2016 -- over the last few days his leg has swollen up and his foot has turned red and his toe is discharging purulent material. He hasn't been on double oral antibiotic coverage for the last 8 days. Recent Right foot x-ray compared to a previous MRI shows soft tissue ulceration distal aspect of the right great toe with no acute erosive bony abnormalities or foreign body. After completing his examination and noting that he has significant cellulitis, purulent discharge and ulceration down probing to bone I spoke to his infectious disease specialist Dr. Vincenza Hews who agreed with me that the patient needs admission to the hospital, appropriate IV antibiotics, debridement by Dr. Celesta Gentile and to see me in follow-up. I have made arrangements for the patient to be admitted by the  ER 03/17/2016 -- he did report to the ER and they tried to admit but he declined overnight hospital stay. Dr. Megan Salon gave instructions for a PICC line to be placed and the patient signed out [place]. He was given a dose of IV Zosyn in the ER prior to discharge. Dr. Megan Salon also  asked him to follow-up with his podiatrist Dr. Celesta Gentile. Reviewing the electronic medical records I note that on 03/10/2016 he had a right arm PICC line placed without a problem. Dr. Vincenza Hews has placed him on appropriate antibiotics Ertapenem 1 g daily. He has not yet seen Dr. Celesta Gentile. 03/24/2016 -- he continues with his IV antibiotics through his PICC line and has an appointment to see Dr. Samara Deist for a podiatry opinion. Objective Constitutional Pulse regular. Respirations normal and unlabored. Afebrile. Vitals Time Taken: 2:05 PM, Height: 74 in, Weight: 242 lbs, BMI: 31.1, Temperature: 98.2 F, Pulse: 75 bpm, Blood Pressure: 135/70 mmHg. Eyes Nonicteric. Reactive to light. ERIQUE, CHRISTISON. ([id number]) Ears, Nose, Mouth, and Throat Lips, teeth, and gums WNL.Marland Kitchen Moist mucosa without lesions. Neck supple and nontender. No palpable supraclavicular or cervical adenopathy. Normal sized without goiter. Respiratory WNL. No retractions.. Cardiovascular Pedal Pulses WNL. No clubbing, cyanosis or edema. Lymphatic No adneopathy. No adenopathy. No adenopathy. Musculoskeletal Adexa without tenderness or enlargement.. Digits and nails w/o clubbing, cyanosis, infection, petechiae, ischemia, or inflammatory conditions.Marland Kitchen Psychiatric Judgement and insight Intact.. No evidence of depression, anxiety, or agitation.. General Notes: the discharge from the wound has gotten less because it is totally covered with callus and after sharp debridement down to the subcutaneous tissue I have not been able to probe down to bone Integumentary (Hair, Skin) No suspicious lesions. No crepitus or fluctuance. No  peri-wound warmth or erythema. No masses.. Wound #1 status is Open. Original cause of wound was Gradually Appeared. The wound is located on the Right Toe Great. The wound measures 0.3cm length x 0.3cm width x 0.4cm depth; 0.071cm^2 area and 0.028cm^3 volume. There is bone exposed. There is no tunneling or undermining noted. There is a medium amount of serosanguineous drainage noted. The wound margin is thickened. There is no granulation within the wound bed. There is no necrotic tissue within the wound bed. The periwound skin appearance exhibited: Callus, Dry/Scaly. General Notes: unable to visualize wound base Assessment Reviewing the electronic medical records I note that on 03/10/2016 he had a right arm PICC line placed without a problem. Dr. Vincenza Hews has placed him on appropriate antibiotics Ertapenem 1 g daily. Procedures JAVIEON, ENCISO ([id number]) Wound #1 Wound #1 is a Diabetic Wound/Ulcer of the Lower Extremity located on the Right Toe Great . There was a Skin/Subcutaneous Tissue Debridement [id number]) debridement with total area of 0.09 sq cm performed by Christin Fudge, MD. with the following instrument(s): Curette to remove Viable and Non-Viable tissue/material including Fibrin/Slough, Callus, and Subcutaneous after achieving pain control using Lidocaine 5% topical ointment. A time out was conducted prior to the start of the procedure. A Minimum amount of bleeding was controlled with N/A. The procedure was tolerated well with a pain level of 0 throughout and a pain level of 0 following the procedure. Post Debridement Measurements: 1cm length x 1cm width x 0.5cm depth; 0.393cm^3 volume. Post procedure Diagnosis Wound #1: Same as Pre-Procedure Plan Wound Cleansing: Wound #1 Right Toe Great: Cleanse wound with mild soap and water May Shower, gently pat wound dry prior to applying new dressing. Primary Wound Dressing: Aquacel Ag Secondary Dressing: Wound #1 Right Toe  Great: Gauze and Kerlix/Conform Dressing Change Frequency: Wound #1 Right Toe Great: Change dressing every other day. Follow-up Appointments: Wound #1 Right Toe Great: Return Appointment in 1 week. Off-Loading: Wound #1 Right Toe Great: Other: - darco shoe Additional Orders / Instructions: Wound #1 Right Toe Great: Stop Smoking Other: -  follow up with Dr Celesta Gentile, podiatry he will continue with: AIDYN, CALKIN. ([id number]) 1. Silver alginate packing and offloading with a healing sandal 2. IV antibiotics as per Dr. Megan Salon 3. See Dr. Samara Deist in follow-up regarding his podiatry options 4. see me back next week in follow-up -- possibly Friday as he has an appointment on Monday to see the podiatrist Electronic Signature(s) Signed: 03/24/2016 2:44:08 PM By: Christin Fudge MD, FACS Entered By: Christin Fudge on 03/24/2016 14:44:08 MONDO, ARAKAKI ([id number]) -------------------------------------------------------------------------------- [place] Details Patient Name: LUIZ, ANGELOS. Date of Service: 03/24/2016 Medical Record Number: [id number] Patient Account Number: [id number] Date of Birth/Sex: December 09, 1956 (60 y.o. Male) Treating RN: Macarthur Critchley Primary Care Physician: Juanell Fairly Other Clinician: Referring Physician: Juanell Fairly Treating Physician/Extender: Frann Rider in Treatment: 14 Diagnosis Coding ICD-10 Codes Code Description E11.621 Type 2 diabetes mellitus with foot ulcer L97.514 Non-pressure chronic ulcer of other part of right foot with necrosis of bone M86.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, right ankle and foot F17.218 Nicotine dependence, cigarettes, with other nicotine-induced disorders L84 Corns and callosities Facility Procedures CPT4 Code Description: [id number] - DEB SUBQ TISSUE 20 SQ CM/< ICD-10 Description Diagnosis E11.621 Type 2 diabetes mellitus with foot ulcer L97.514 Non-pressure chronic ulcer of other part of right  fo M86.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, right  ankle and fo F17.218 Nicotine dependence, cigarettes, with other nicotine Modifier: ot with necros ot -induced disor Quantity: 1 is of bone ders Physician Procedures CPT4 Code Description: [id number] 11042 - WC PHYS SUBQ TISS 20 SQ CM ICD-10 Description Diagnosis E11.621 Type 2 diabetes mellitus with foot ulcer L97.514 Non-pressure chronic ulcer of other part of right fo M86.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, right  ankle and fo F17.218 Nicotine dependence, cigarettes, with other nicotine Modifier: ot with necros ot -induced disor Quantity: 1 is of bone ders Electronic Signature(s) Signed: 03/24/2016 2:44:25 PM By: Christin Fudge MD, FACS Entered By: Christin Fudge on 03/24/2016 14:44:24

## 2016-03-25 NOTE — Progress Notes (Signed)
[name], [name] ([id number]) Visit Report for 03/24/2016 Arrival Information Details Patient Name: Willie, Krueger. Date of Service: 03/24/2016 1:30 PM Medical Record Number: [id number] Patient Account Number: [id number] Date of Birth/Sex: January 17, 1956 (60 y.o. Male) Treating RN: Macarthur Critchley Primary Care Physician: Juanell Fairly Other Clinician: Referring Physician: Juanell Fairly Treating Physician/Extender: Frann Rider in Treatment: 14 Visit Information History Since Last Visit All ordered tests and consults were No Patient Arrived: Ambulatory completed: Arrival Time: 14:03 Added or deleted any medications: No Accompanied By: self Any new allergies or adverse reactions: No Transfer Assistance: None Had a fall or experienced change in No Patient Identification Verified: Yes activities of daily living that may affect Secondary Verification Process Yes risk of falls: Completed: Signs or symptoms of abuse/neglect No Patient Requires Transmission- No since last visito Based Precautions: Hospitalized since last visit: No Patient Has Alerts: Yes Has Dressing in Place as Prescribed: Yes Patient Alerts: Patient on Blood Has Footwear/Offloading in Place as Yes Thinner Prescribed: Left: Surgical Shoe with Pressure Relief Insole Pain Present Now: No Electronic Signature(s) Signed: 03/24/2016 5:13:11 PM By: [name], RN, [name] Entered By: [name] RN, [name] on 03/24/2016 14:03:56 [name] ([id number]) -------------------------------------------------------------------------------- Encounter Discharge Information Details Patient Name: Willie, Krueger. Date of Service: 03/24/2016 1:30 PM Medical Record Number: [id number] Patient Account Number: [id number] Date of Birth/Sex: November 16, 1956 (60 y.o. Male) Treating RN: Macarthur Critchley Primary Care Physician: Juanell Fairly Other Clinician: Referring Physician: Juanell Fairly Treating Physician/Extender: Frann Rider in  Treatment: 33 Encounter Discharge Information Items Discharge Pain Level: 0 Discharge Condition: Stable Ambulatory Status: Ambulatory Discharge Destination: Home Transportation: Private Auto Schedule Follow-up Appointment: Yes Medication Reconciliation completed and provided to Patient/Care No Sherrill Mckamie: Provided on Clinical Summary of Care: 03/24/2016 Form Type Recipient Paper Patient GM Electronic Signature(s) Signed: 03/24/2016 5:13:11 PM By: [name] RN, [name] Previous Signature: 03/24/2016 2:41:16 PM Version By: Ruthine Dose Entered By: [name] RN, [name] on 03/24/2016 16:49:56 HIRO, DUFOUR ([id number]) -------------------------------------------------------------------------------- Lower Extremity Assessment Details Patient Name: Willie, Krueger. Date of Service: 03/24/2016 1:30 PM Medical Record Number: [id number] Patient Account Number: [id number] Date of Birth/Sex: 1956-08-20 (60 y.o. Male) Treating RN: Macarthur Critchley Primary Care Physician: Juanell Fairly Other Clinician: Referring Physician: Juanell Fairly Treating Physician/Extender: Frann Rider in Treatment: 14 Edema Assessment Assessed: [Left: No] [Right: No] Edema: [Left: Ye] [Right: s] Vascular Assessment Pulses: Posterior Tibial Dorsalis Pedis Palpable: [Right:Yes] Extremity colors, hair growth, and conditions: Extremity Color: [Right:Mottled] Hair Growth on Extremity: [Right:No] Temperature of Extremity: [Right:Warm] Capillary Refill: [Right:< 3 seconds] Dependent Rubor: [Right:No] Blanched when Elevated: [Right:No] Lipodermatosclerosis: [Right:No] Toe Nail Assessment Left: Right: Thick: Yes Discolored: No Deformed: No Improper Length and Hygiene: No Electronic Signature(s) Signed: 03/24/2016 5:13:11 PM By: [name], RN, [name] Entered By: [name] RN, [name] on 03/24/2016 14:12:22 RIDHAAN, TINDOL  ([id number]) -------------------------------------------------------------------------------- Pain Assessment Details Patient Name: Willie, Krueger. Date of Service: 03/24/2016 1:30 PM Medical Record Number: [id number] Patient Account Number: [id number] Date of Birth/Sex: 05/24/1956 (60 y.o. Male) Treating RN: Macarthur Critchley Primary Care Physician: Juanell Fairly Other Clinician: Referring Physician: Juanell Fairly Treating Physician/Extender: Frann Rider in Treatment: 14 Active Problems Location of Pain Severity and Description of Pain Patient Has Paino No Site Locations Rate the pain. Current Pain Level: 0 Pain Management and Medication Current Pain Management: Electronic Signature(s) Signed: 03/24/2016 5:13:11 PM By: [name], RN, [name] Entered By: [name] RN, [name] on 03/24/2016 14:04:04 [name] ([id number]) -------------------------------------------------------------------------------- Patient/Caregiver Education Details Patient Name: Willie, Krueger. Date of Service: 03/24/2016 1:30  PM Medical Record Number: [id number] Patient Account Number: [id number] Date of Birth/Gender: [birth year] (60 y.o. Male) Treating RN: Macarthur Critchley Primary Care Physician: Juanell Fairly Other Clinician: Referring Physician: Juanell Fairly Treating Physician/Extender: Frann Rider in Treatment: 14 Education Assessment Education Provided To: Patient Education Topics Provided Wound/Skin Impairment: Handouts: Caring for Your Ulcer, Skin Care Do's and Dont's Methods: Explain/Verbal Responses: State content correctly Electronic Signature(s) Signed: 03/24/2016 5:13:11 PM By: [name], RN, [name] Entered By: [name] RN, [name] on 03/24/2016 16:50:09 CAYNE, KLESS ([id number]) -------------------------------------------------------------------------------- Wound Assessment Details Patient Name: [name], [name]. Date of Service: 03/24/2016 1:30 PM Medical Record Number:  [id number] Patient Account Number: [id number] Date of Birth/Sex: 12-16-1956 (60 y.o. Male) Treating RN: Macarthur Critchley Primary Care Physician: Juanell Fairly Other Clinician: Referring Physician: Juanell Fairly Treating Physician/Extender: Frann Rider in Treatment: 14 Wound Status Wound Number: 1 Primary Diabetic Wound/Ulcer of the Lower Etiology: Extremity Wound Location: Right Toe Great Wound Open Wounding Event: Gradually Appeared Status: Date Acquired: 07/02/2015 Comorbid Anemia, Asthma, Hypertension, Type Weeks Of Treatment: 14 History: II Diabetes, Neuropathy Clustered Wound: No Photos Photo Uploaded By: [name], RN, [name] on 03/24/2016 17:09:48 Wound Measurements Length: (cm) 0.3 Width: (cm) 0.3 Depth: (cm) 0.4 Area: (cm) 0.071 Volume: (cm) 0.028 % Reduction in Area: 87.1% % Reduction in Volume: 49.1% Epithelialization: None Tunneling: No Undermining: No Wound Description Classification: Grade 2 Wound Margin: Thickened Exudate Amount: Medium Exudate Type: Serosanguineous Exudate Color: red, brown Foul Odor After Cleansing: No Wound Bed Granulation Amount: None Present (0%) Exposed Structure Necrotic Amount: None Present (0%) Bone Exposed: Yes Periwound Skin Texture Texture Color FIELDER, GENGLER. ([id number]) No Abnormalities Noted: No No Abnormalities Noted: No Callus: Yes Moisture No Abnormalities Noted: No Dry / Scaly: Yes Wound Preparation Ulcer Cleansing: Rinsed/Irrigated with Saline Topical Anesthetic Applied: Other: lidocaine 4%, Assessment Notes unable to visualize wound base Treatment Notes Wound #1 (Right Toe Great) 1. Cleansed with: Clean wound with Normal Saline 4. Dressing Applied: Aquacel Ag 5. Secondary Dressing Applied Gauze and Kerlix/Conform Electronic Signature(s) Signed: 03/24/2016 5:13:11 PM By: [name], RN, [name] Entered By: [name] RN, [name] on 03/24/2016 14:27:10 NEVEN, FICARA  ([id number]) -------------------------------------------------------------------------------- [place] Details Patient Name: QUINSHAWN, SHONG. Date of Service: 03/24/2016 1:30 PM Medical Record Number: [id number] Patient Account Number: [id number] Date of Birth/Sex: Nov 26, 1956 (60 y.o. Male) Treating RN: Macarthur Critchley Primary Care Physician: Juanell Fairly Other Clinician: Referring Physician: Juanell Fairly Treating Physician/Extender: Frann Rider in Treatment: 14 Vital Signs Time Taken: 14:05 Temperature (F): 98.2 Height (in): 74 Pulse (bpm): 75 Weight (lbs): 242 Blood Pressure (mmHg): 135/70 Body Mass Index (BMI): 31.1 Reference Range: 80 - 120 mg / dl Electronic Signature(s) Signed: 03/24/2016 5:13:11 PM By: [name], RN, [name] Entered By: [name] RN, [name] on 03/24/2016 14:05:14

## 2016-04-02 ENCOUNTER — Ambulatory Visit: Payer: 59 | Admitting: Internal Medicine

## 2016-04-07 ENCOUNTER — Encounter: Payer: 59 | Attending: Surgery | Admitting: Surgery

## 2016-04-07 DIAGNOSIS — F17218 Nicotine dependence, cigarettes, with other nicotine-induced disorders: Secondary | ICD-10-CM | POA: Diagnosis not present

## 2016-04-07 DIAGNOSIS — M86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, right ankle and foot: Secondary | ICD-10-CM | POA: Diagnosis not present

## 2016-04-07 DIAGNOSIS — L97514 Non-pressure chronic ulcer of other part of right foot with necrosis of bone: Secondary | ICD-10-CM | POA: Diagnosis not present

## 2016-04-07 DIAGNOSIS — L84 Corns and callosities: Secondary | ICD-10-CM | POA: Insufficient documentation

## 2016-04-07 DIAGNOSIS — I10 Essential (primary) hypertension: Secondary | ICD-10-CM | POA: Diagnosis not present

## 2016-04-07 DIAGNOSIS — E11621 Type 2 diabetes mellitus with foot ulcer: Secondary | ICD-10-CM | POA: Insufficient documentation

## 2016-04-07 DIAGNOSIS — E1140 Type 2 diabetes mellitus with diabetic neuropathy, unspecified: Secondary | ICD-10-CM | POA: Insufficient documentation

## 2016-04-07 NOTE — Progress Notes (Signed)
Willie Krueger, Willie Krueger ([id number]) Visit Report for 04/07/2016 Arrival Information Details Patient Name: Willie Krueger, Willie Krueger. Date of Service: 04/07/2016 10:00 AM Medical Record Number: [id number] Patient Account Number: [id number] Date of Birth/Sex: 08/29/1956 (60 y.o. Male) Treating RN: Macarthur Critchley Primary Care Physician: Juanell Fairly Other Clinician: Referring Physician: Juanell Fairly Treating Physician/Extender: Frann Rider in Treatment: 16 Visit Information History Since Last Visit All ordered tests and consults were No Patient Arrived: Ambulatory completed: Arrival Time: 10:02 Added or deleted any medications: No Accompanied By: self Any new allergies or adverse reactions: No Transfer Assistance: None Had a fall or experienced change in No Patient Identification Verified: Yes activities of daily living that may affect Secondary Verification Process Yes risk of falls: Completed: Signs or symptoms of abuse/neglect since No Patient Requires Transmission- No last visito Based Precautions: Hospitalized since last visit: No Patient Has Alerts: Yes Has Dressing in Place as Prescribed: Yes Patient Alerts: Patient on Blood Has Footwear/Offloading in Place as Yes Thinner Prescribed: Left: Other:darco Pain Present Now: No Electronic Signature(s) Signed: 04/07/2016 4:17:15 PM By: [name], RN, [name] Entered By: [name] RN, [name] on 04/07/2016 10:04:48 [name] ([id number]) -------------------------------------------------------------------------------- Clinic Level of Care Assessment Details Patient Name: Willie Krueger, Willie Krueger. Date of Service: 04/07/2016 10:00 AM Medical Record Number: [id number] Patient Account Number: [id number] Date of Birth/Sex: February 04, 1956 (60 y.o. Male) Treating RN: Macarthur Critchley Primary Care Physician: Juanell Fairly Other Clinician: Referring Physician: Juanell Fairly Treating Physician/Extender: Frann Rider in Treatment: 16 Clinic Level  of Care Assessment Items TOOL 4 Quantity Score X - Use when only an EandM is performed on FOLLOW-UP visit 1 0 ASSESSMENTS - Nursing Assessment / Reassessment X - Reassessment of Co-morbidities (includes updates in patient status) 1 10 X - Reassessment of Adherence to Treatment Plan 1 5 ASSESSMENTS - Wound and Skin Assessment / Reassessment X - Simple Wound Assessment / Reassessment - one wound 1 5 []  - Complex Wound Assessment / Reassessment - multiple wounds 0 []  - Dermatologic / Skin Assessment (not related to wound area) 0 ASSESSMENTS - Focused Assessment []  - Circumferential Edema Measurements - multi extremities 0 []  - Nutritional Assessment / Counseling / Intervention 0 X - Lower Extremity Assessment (monofilament, tuning fork, pulses) 1 5 []  - Peripheral Arterial Disease Assessment (using hand held doppler) 0 ASSESSMENTS - Ostomy and/or Continence Assessment and Care []  - Incontinence Assessment and Management 0 []  - Ostomy Care Assessment and Management (repouching, etc.) 0 PROCESS - Coordination of Care X - Simple Patient / Family Education for ongoing care 1 15 []  - Complex (extensive) Patient / Family Education for ongoing care 0 X - Staff obtains Programmer, systems, Records, Test Results / Process Orders 1 10 []  - Staff telephones HHA, Nursing Homes / Clarify orders / etc 0 []  - Routine Transfer to another Facility (non-emergent condition) 0 Willie Krueger, Willie Krueger ([id number]) []  - Routine Hospital Admission (non-emergent condition) 0 []  - New Admissions / Biomedical engineer / Ordering NPWT, Apligraf, etc. 0 []  - Emergency Hospital Admission (emergent condition) 0 X - Simple Discharge Coordination 1 10 []  - Complex (extensive) Discharge Coordination 0 PROCESS - Special Needs []  - Pediatric / Minor Patient Management 0 []  - Isolation Patient Management 0 []  - Hearing / Language / Visual special needs 0 []  - Assessment of Community assistance (transportation, D/C planning, etc.)  0 []  - Additional assistance / Altered mentation 0 []  - Support Surface(s) Assessment (bed, cushion, seat, etc.) 0 INTERVENTIONS - Wound Cleansing / Measurement X - Simple Wound Cleansing -  one wound 1 5 []  - Complex Wound Cleansing - multiple wounds 0 X - Wound Imaging (photographs - any number of wounds) 1 5 []  - Wound Tracing (instead of photographs) 0 X - Simple Wound Measurement - one wound 1 5 []  - Complex Wound Measurement - multiple wounds 0 INTERVENTIONS - Wound Dressings []  - Small Wound Dressing one or multiple wounds 0 []  - Medium Wound Dressing one or multiple wounds 0 []  - Large Wound Dressing one or multiple wounds 0 []  - Application of Medications - topical 0 []  - Application of Medications - injection 0 INTERVENTIONS - Miscellaneous []  - External ear exam 0 Willie Krueger, Willie Krueger ([id number]) []  - Specimen Collection (cultures, biopsies, blood, body fluids, etc.) 0 []  - Specimen(s) / Culture(s) sent or taken to Lab for analysis 0 []  - Patient Transfer (multiple staff / Harrel Lemon Lift / Similar devices) 0 []  - Simple Staple / Suture removal (25 or less) 0 []  - Complex Staple / Suture removal (26 or more) 0 []  - Hypo / Hyperglycemic Management (close monitor of Blood Glucose) 0 []  - Ankle / Brachial Index (ABI) - do not check if billed separately 0 X - Vital Signs 1 5 Has the patient been seen at the hospital within the last three years: Yes Total Score: 80 Level Of Care: New/Established - Level 3 Electronic Signature(s) Signed: 04/07/2016 4:17:15 PM By: [name], RN, [name] Entered By: [name] RN, [name] on 04/07/2016 10:22:53 Willie Krueger, Willie Krueger ([id number]) -------------------------------------------------------------------------------- Encounter Discharge Information Details Patient Name: Willie Krueger, Willie Krueger. Date of Service: 04/07/2016 10:00 AM Medical Record Number: [id number] Patient Account Number: [id number] Date of Birth/Sex: October 16, 1956 (60 y.o. Male) Treating RN:  Macarthur Critchley Primary Care Physician: Juanell Fairly Other Clinician: Referring Physician: Juanell Fairly Treating Physician/Extender: Frann Rider in Treatment: 55 Encounter Discharge Information Items Discharge Pain Level: 0 Discharge Condition: Stable Ambulatory Status: Ambulatory Discharge Destination: Home Transportation: Private Auto Accompanied By: self Schedule Follow-up Appointment: Yes Medication Reconciliation completed and provided to Patient/Care Yes Barabara Motz: Provided on Clinical Summary of Care: 04/07/2016 Form Type Recipient Paper Patient GM Electronic Signature(s) Signed: 04/07/2016 10:21:38 AM By: Ruthine Dose Entered By: Ruthine Dose on 04/07/2016 10:21:37 [name] ([id number]) -------------------------------------------------------------------------------- Lower Extremity Assessment Details Patient Name: Willie Krueger, Willie Krueger. Date of Service: 04/07/2016 10:00 AM Medical Record Number: [id number] Patient Account Number: [id number] Date of Birth/Sex: Apr 13, 1956 (60 y.o. Male) Treating RN: Macarthur Critchley Primary Care Physician: Juanell Fairly Other Clinician: Referring Physician: Juanell Fairly Treating Physician/Extender: Frann Rider in Treatment: 16 Vascular Assessment Pulses: Posterior Tibial Dorsalis Pedis Palpable: [Right:Yes] Extremity colors, hair growth, and conditions: Extremity Color: [Right:Mottled] Hair Growth on Extremity: [Right:No] Temperature of Extremity: [Right:Warm] Capillary Refill: [Right:< 3 seconds] Toe Nail Assessment Left: Right: Thick: Yes Discolored: No Deformed: No Improper Length and Hygiene: No Electronic Signature(s) Signed: 04/07/2016 4:17:15 PM By: [name], RN, [name] Entered By: [name] RN, [name] on 04/07/2016 10:09:59 Willie Krueger, Willie Krueger ([id number]) -------------------------------------------------------------------------------- Pain Assessment Details Patient Name: Willie Krueger, Willie Krueger. Date of  Service: 04/07/2016 10:00 AM Medical Record Number: [id number] Patient Account Number: [id number] Date of Birth/Sex: 04/03/1956 (60 y.o. Male) Treating RN: Macarthur Critchley Primary Care Physician: Juanell Fairly Other Clinician: Referring Physician: Juanell Fairly Treating Physician/Extender: Frann Rider in Treatment: 16 Active Problems Location of Pain Severity and Description of Pain Patient Has Paino No Site Locations Rate the pain. Current Pain Level: 0 Pain Management and Medication Current Pain Management: Electronic Signature(s) Signed: 04/07/2016 4:17:15 PM By: [name], RN, [name] Entered By: [name] RN, [name]  on 04/07/2016 10:05:15 Willie Krueger, Willie Krueger ([id number]) -------------------------------------------------------------------------------- Patient/Caregiver Education Details Patient Name: Willie Krueger, Willie Krueger. Date of Service: 04/07/2016 10:00 AM Medical Record Number: [id number] Patient Account Number: [id number] Date of Birth/Gender: Feb 27, 1956 (60 y.o. Male) Treating RN: Macarthur Critchley Primary Care Physician: Juanell Fairly Other Clinician: Referring Physician: Juanell Fairly Treating Physician/Extender: Frann Rider in Treatment: 16 Education Assessment Education Provided To: Patient Education Topics Provided Wound/Skin Impairment: Handouts: Caring for Your Ulcer, Skin Care Do's and Dont's Methods: Explain/Verbal Responses: State content correctly Electronic Signature(s) Signed: 04/07/2016 4:17:15 PM By: [name], RN, [name] Entered By: [name] RN, [name] on 04/07/2016 10:10:30 Willie Krueger, ABATE ([id number]) -------------------------------------------------------------------------------- Wound Assessment Details Patient Name: CRAY, PREW. Date of Service: 04/07/2016 10:00 AM Medical Record Number: [id number] Patient Account Number: [id number] Date of Birth/Sex: 04-Feb-1956 (60 y.o. Male) Treating RN: Macarthur Critchley Primary Care Physician: Juanell Fairly Other Clinician: Referring Physician: Juanell Fairly Treating Physician/Extender: Frann Rider in Treatment: 16 Wound Status Wound Number: 1 Primary Diabetic Wound/Ulcer of the Lower Etiology: Extremity Wound Location: Right Toe Great Wound Open Wounding Event: Gradually Appeared Status: Date Acquired: 07/02/2015 Comorbid Anemia, Asthma, Hypertension, Type Weeks Of Treatment: 16 History: II Diabetes, Neuropathy Clustered Wound: No Wound Measurements Length: (cm) 0 Width: (cm) 0 Depth: (cm) 0 Area: (cm) 0 Volume: (cm) 0 % Reduction in Area: 100% % Reduction in Volume: 100% Epithelialization: None Wound Description Classification: Grade 2 Wound Margin: Thickened Exudate Amount: Medium Exudate Type: Serosanguineous Exudate Color: red, brown Foul Odor After Cleansing: No Wound Bed Granulation Amount: None Present (0%) Exposed Structure Necrotic Amount: None Present (0%) Bone Exposed: Yes Periwound Skin Texture Texture Color No Abnormalities Noted: No No Abnormalities Noted: No Callus: Yes Moisture No Abnormalities Noted: No Dry / Scaly: Yes Wound Preparation Ulcer Cleansing: Rinsed/Irrigated with Saline Assessment Notes wound has no open area, no drainage noted TRENIDAD, GRIZZEL ([id number]) Electronic Signature(s) Signed: 04/07/2016 4:17:15 PM By: [name], RN, [name] Entered By: [name] RN, [name] on 04/07/2016 10:16:21 KYRIS, MARSCHKE ([id number]) -------------------------------------------------------------------------------- Vitals Details Patient Name: MAHONRI, MILHOUSE. Date of Service: 04/07/2016 10:00 AM Medical Record Number: [id number] Patient Account Number: [id number] Date of Birth/Sex: 30-Oct-1956 (60 y.o. Male) Treating RN: Macarthur Critchley Primary Care Physician: Juanell Fairly Other Clinician: Referring Physician: Juanell Fairly Treating Physician/Extender: Frann Rider in Treatment: 16 Vital Signs Time Taken: 10:05 Temperature  (F): 98.4 Height (in): 74 Pulse (bpm): 76 Weight (lbs): 242 Blood Pressure (mmHg): 158/79 Body Mass Index (BMI): 31.1 Reference Range: 80 - 120 mg / dl Electronic Signature(s) Signed: 04/07/2016 4:17:15 PM By: [name], RN, [name] Entered By: [name] RN, [name] on 04/07/2016 10:05:35

## 2016-04-07 NOTE — Progress Notes (Addendum)
Willie, Krueger ([id number]) Visit Report for 04/07/2016 Chief Complaint Document Details Patient Name: Willie Krueger, Willie Krueger. Date of Service: 04/07/2016 10:00 AM Medical Record Number: [id number] Patient Account Number: [id number] Date of Birth/Sex: June 18, 1956 (60 y.o. Male) Treating RN: Macarthur Critchley Primary Care Physician: Juanell Fairly Other Clinician: Referring Physician: Juanell Fairly Treating Physician/Extender: Frann Rider in Treatment: 16 Information Obtained from: Patient Chief Complaint Patients presents for treatment of an open diabetic ulcer to his right big toe which she's had for several months Electronic Signature(s) Signed: 04/07/2016 10:49:06 AM By: Christin Fudge MD, FACS Entered By: Christin Fudge on 04/07/2016 10:49:06 Willie, [name] ([id number]) -------------------------------------------------------------------------------- HPI Details Patient Name: Willie Krueger, Willie Krueger. Date of Service: 04/07/2016 10:00 AM Medical Record Number: [id number] Patient Account Number: [id number] Date of Birth/Sex: Dec 26, 1956 (60 y.o. Male) Treating RN: Macarthur Critchley Primary Care Physician: Juanell Fairly Other Clinician: Referring Physician: Juanell Fairly Treating Physician/Extender: Frann Rider in Treatment: 16 History of Present Illness Location: ulcerated area on his right big toe Quality: Patient reports No Pain. Severity: Patient states wound are getting worse. Duration: Patient has had the wound for > 3 months prior to seeking treatment at the wound center Timing: Pain in wound is Intermittent (comes and goes Context: The wound appeared gradually over time Modifying Factors: Consults to this date include: infectious disease who has put him on IV antibiotics with a PICC line Associated Signs and Symptoms: Patient reports having difficulty standing for long periods. HPI Description: 60 year old gentleman with multiple medical problems with a past medical history  of diabetes mellitus, hypertension, neuropathy, chronic pain and nicotine abuse. he was recently seen by Dr. Vincenza Hews of infectious disease at [facility] for a osteomyelitis of the distal phalanx of his right great toe. He also had diffuse cellulitis of the right lower leg. He has been advised to have a PICC line and will receive IV vancomycin and oral ciprofloxacin plus oral metronidazole. he had an MRI which was showing edema of the distal phalanx with probably represent osteomyelitis. He smokes a pack and a half of cigarettes a day. his last hemoglobin A1c was 8% A venous duplex of the right lower extremity was done recently on 11/30/2015 and this showed no evidence of DVT, superficial thrombophlebitis or incompetence. 01/11/2016 -- the patient only comes here once every 2 weeks and says he has several appointments and other issues which prevented him from coming for wound care. He has declined hyperbaric oxygen therapy, declined to stop smoking and has declined to see a surgeon regarding options for his toe. 02/18/2016 -- the patient has not seen [place] for the last 5 weeks and has several excuses for not being here. In the meanwhile he says his PICC line was removed and he is on no antibiotics and his ID specialist Dr. Vincenza Hews told him that his infection was all gone. The patient continues to smoke, says his diabetes is under good control and he has not been doing any specific dressing to his right big toe. I have reviewed Dr. Hale Bogus note from 01/23/2016 -- he had completed 45 days of IV antibiotic with vancomycin, oral ciprofloxacin and oral metronidazole for the right great toe osteomyelitis. He thought that clinically the osteomyelitis has been cured and he stopped his IV antibiotics and had the PICC line removed. Follow-up in 6 weeks was recommended. I have just spoken to Dr. Megan Salon just now and he will call for the patient to review the cultures from today and put him on  appropriate  antibiotic. 02/25/2016 -- tissue culture report grew heavy growth of Escherichia coli which is sensitive to Kefzol and, cefepime, ceftriaxone, gentamicin, imipenem, Bactrim and Zosyn. I had spoken to Dr. Megan Salon over the phone and discussed the report with him and he is going to call the patient and put him on an appropriate antibiotic. I have reviewed Dr. Hale Bogus note on the electronic medical record -- foot cultures have grown [place] ([id number]) Escherichia coli and Enterococcus casselflavus. He decide to treat him with cefadroxil and ampicillin sulbactam. 03/03/2016 -- over the last few days his leg has swollen up and his foot has turned red and his toe is discharging purulent material. He hasn't been on double oral antibiotic coverage for the last 8 days. Recent Right foot x-ray compared to a previous MRI shows soft tissue ulceration distal aspect of the right great toe with no acute erosive bony abnormalities or foreign body. After completing his examination and noting that he has significant cellulitis, purulent discharge and ulceration down probing to bone I spoke to his infectious disease specialist Dr. Vincenza Hews who agreed with me that the patient needs admission to the hospital, appropriate IV antibiotics, debridement by Dr. Celesta Gentile and to see me in follow-up. I have made arrangements for the patient to be admitted by the ER 03/17/2016 -- he did report to the ER and they tried to admit but he declined overnight hospital stay. Dr. Megan Salon gave instructions for a PICC line to be placed and the patient signed out [place]. He was given a dose of IV Zosyn in the ER prior to discharge. Dr. Megan Salon also asked him to follow-up with his podiatrist Dr. Celesta Gentile. Reviewing the electronic medical records I note that on 03/10/2016 he had a right arm PICC line placed without a problem. Dr. Vincenza Hews has placed him on appropriate antibiotics o Ertapenem  1 g daily. He has not yet seen Dr. Celesta Gentile. 03/24/2016 -- he continues with his IV antibiotics through his PICC line and has an appointment to see Dr. Samara Deist for a podiatry opinion. 04/07/2016 -- he was seen by Dr. Samara Deist on 03/31/2016 -- his assessment was that of diabetes mellitus with polyneuropathy and ulcer of the foot limited to breakdown of skin. He had debrided the wound and nails and asked him to come back in 6 weeks. no x-rays were taken during the podiatry visit. Electronic Signature(s) Signed: 04/07/2016 10:49:17 AM By: Christin Fudge MD, FACS Previous Signature: 04/07/2016 10:21:32 AM Version By: Christin Fudge MD, FACS Previous Signature: 04/07/2016 10:12:31 AM Version By: Christin Fudge MD, FACS Entered By: Christin Fudge on 04/07/2016 10:49:17 Willie Krueger, Willie Krueger ([id number]) -------------------------------------------------------------------------------- Physical Exam Details Patient Name: Willie Krueger, Willie Krueger. Date of Service: 04/07/2016 10:00 AM Medical Record Number: [id number] Patient Account Number: [id number] Date of Birth/Sex: 05-29-1956 (60 y.o. Male) Treating RN: Macarthur Critchley Primary Care Physician: Juanell Fairly Other Clinician: Referring Physician: Juanell Fairly Treating Physician/Extender: Frann Rider in Treatment: 16 Constitutional . Pulse regular. Respirations normal and unlabored. Afebrile. . Eyes Nonicteric. Reactive to light. Ears, Nose, Mouth, and Throat Lips, teeth, and gums WNL.Marland Kitchen Moist mucosa without lesions. Neck supple and nontender. No palpable supraclavicular or cervical adenopathy. Normal sized without goiter. Respiratory WNL. No retractions.. Cardiovascular Pedal Pulses WNL. No clubbing, cyanosis or edema. Lymphatic No adneopathy. No adenopathy. No adenopathy. Musculoskeletal Adexa without tenderness or enlargement.. Digits and nails w/o clubbing, cyanosis, infection, petechiae, ischemia, or inflammatory  conditions.. Integumentary (Hair, Skin) No suspicious lesions. No crepitus or  fluctuance. No peri-wound warmth or erythema. No masses.Marland Kitchen Psychiatric Judgement and insight Intact.. No evidence of depression, anxiety, or agitation.. Notes the wound is completely closed and there is good supple scar tissue with no evidence of cellulitis or any drainage from this area. Electronic Signature(s) Signed: 04/07/2016 10:49:47 AM By: Christin Fudge MD, FACS Entered By: Christin Fudge on 04/07/2016 10:49:47 Willie Krueger, Willie Krueger ([id number]) -------------------------------------------------------------------------------- Physician Orders Details Patient Name: LYNN, WAGAMAN. Date of Service: 04/07/2016 10:00 AM Medical Record Number: [id number] Patient Account Number: [id number] Date of Birth/Sex: [birth year] (60 y.o. Male) Treating RN: Macarthur Critchley Primary Care Physician: Juanell Fairly Other Clinician: Referring Physician: Juanell Fairly Treating Physician/Extender: Frann Rider in Treatment: 4 Verbal / Phone Orders: Yes Clinician: Macarthur Critchley Read Back and Verified: Yes Diagnosis Coding Wound Cleansing Wound #1 Right Toe Great o Cleanse wound with mild soap and water o May Shower, gently pat wound dry prior to applying new dressing. Primary Wound Dressing o Other: - may apply a band-aid over area to ensure there no drainage coming from wound site, If any drainage noted return to clinic Follow-up Appointments o Other: - If any drainage noted return to clinic Off-Loading Wound #1 Right Toe Great o Other: - continue with darco shoe Additional Orders / Instructions Wound #1 Right Toe Great o Stop Smoking o Other: - follow up with Dr Celesta Gentile, podiatry Discharge From [place] #1 Right Toe Great o Discharge from [place] - If any drainage noted return to clinic. Follow up with Dr Megan Salon and Dr Vickki Muff Electronic Signature(s) Signed: 04/07/2016  3:21:43 PM By: Christin Fudge MD, FACS Signed: 04/07/2016 4:17:15 PM By: [name] RN, [name] Entered By: [name] RN, [name] on 04/07/2016 10:18:50 Willie Krueger, Willie Krueger ([id number]) -------------------------------------------------------------------------------- Problem List Details Patient Name: Willie Krueger, Willie Krueger. Date of Service: 04/07/2016 10:00 AM Medical Record Number: [id number] Patient Account Number: [id number] Date of Birth/Sex: 12/17/1956 (60 y.o. 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 Date of Service: 04/07/2016 10:00 AM Medical Record Number: [id number] Patient Account Number: [id number] Date of Birth/Sex: 1956-02-24 (60 y.o. Male) Treating RN: Macarthur Critchley Primary Care Physician: Juanell Fairly Other Clinician: Referring Physician: Juanell Fairly Treating Physician/Extender: Frann Rider in Treatment: 16 Subjective Chief Complaint Information obtained from Patient Patients presents for treatment of an open diabetic ulcer to his right big toe which she's  had for several months History of Present Illness (HPI) The following HPI elements were documented for the patient's wound: Location: ulcerated area on his right big toe Quality: Patient reports No Pain. Severity: Patient states wound are getting worse. Duration: Patient has had the wound for > 3 months prior to seeking treatment at the wound center Timing: Pain in wound is Intermittent (comes and goes Context: The wound appeared gradually over time Modifying Factors: Consults to this date include: infectious disease who  has put him on IV antibiotics with a PICC line Associated Signs and Symptoms: Patient reports having difficulty standing for long periods. 60 year old gentleman with multiple medical problems with a past medical history of diabetes mellitus, hypertension, neuropathy, chronic pain and nicotine abuse. he was recently seen by Dr. Vincenza Hews of infectious disease at [facility] for a osteomyelitis of the distal phalanx of his right great toe. He also had diffuse cellulitis of the right lower leg. He has been advised to have a PICC line and will receive IV vancomycin and oral ciprofloxacin plus oral metronidazole. he had an MRI which was showing edema of the distal phalanx with probably represent osteomyelitis. He smokes a pack and a half of cigarettes a day. his last hemoglobin A1c was 8% A venous duplex of the right lower extremity was done recently on 11/30/2015 and this showed no evidence of DVT, superficial thrombophlebitis or incompetence. 01/11/2016 -- the patient only comes here once every 2 weeks and says he has several appointments and other issues which prevented him from coming for wound care. He has declined hyperbaric oxygen therapy, declined to stop smoking and has declined to see a surgeon regarding options for his toe. 02/18/2016 -- the patient has not seen [place] for the last 5 weeks and has several excuses for not being here. In the meanwhile he says his PICC  line was removed and he is on no antibiotics and his ID specialist Dr. Vincenza Hews told him that his infection was all gone. The patient continues to smoke, says his diabetes is under good control and he has not been doing any specific dressing to his right big toe. I have reviewed Dr. Hale Bogus note from 01/23/2016 -- he had completed 45 days of IV antibiotic with vancomycin, oral ciprofloxacin and oral metronidazole for the right great toe osteomyelitis. Willie Krueger, Willie Krueger [place]. ([id number]) He thought that clinically the osteomyelitis has been cured and he stopped his IV antibiotics and had the PICC line removed. Follow-up in 6 weeks was recommended. I have just spoken to Dr. Megan Salon just now and he will call for the patient to review the cultures from today and put him on appropriate antibiotic. 02/25/2016 -- tissue culture report grew heavy growth of Escherichia coli which is sensitive to Kefzol and, cefepime, ceftriaxone, gentamicin, imipenem, Bactrim and Zosyn. I had spoken to Dr. Megan Salon over the phone and discussed the report with him and he is going to call the patient and put him on an appropriate antibiotic. I have reviewed Dr. Hale Bogus note on the electronic medical record -- foot cultures have grown Escherichia coli and Enterococcus casselflavus. He decide to treat him with cefadroxil and ampicillin sulbactam. 03/03/2016 -- over the last few days his leg has swollen up and his foot has turned red and his toe is discharging purulent material. He hasn't been on double oral antibiotic coverage for the last 8 days. Recent Right foot x-ray compared to a previous MRI shows soft tissue ulceration distal aspect of the right great toe with no acute erosive bony abnormalities or foreign body. After completing his examination and noting that he has significant cellulitis, purulent discharge and ulceration down probing to bone I spoke to his infectious disease specialist Dr. Vincenza Hews who  agreed with me that the patient needs admission to the hospital, appropriate IV antibiotics, debridement by Dr. Celesta Gentile and to see me in follow-up. I have made arrangements for the patient to be admitted by the ER 03/17/2016 -- he did report to  the ER and they tried to admit but he declined overnight hospital stay. Dr. Megan Salon gave instructions for a PICC line to be placed and the patient signed out [place]. He was given a dose of IV Zosyn in the ER prior to discharge. Dr. Megan Salon also asked him to follow-up with his podiatrist Dr. Celesta Gentile. Reviewing the electronic medical records I note that on 03/10/2016 he had a right arm PICC line placed without a problem. Dr. Vincenza Hews has placed him on appropriate antibiotics Ertapenem 1 g daily. He has not yet seen Dr. Celesta Gentile. 03/24/2016 -- he continues with his IV antibiotics through his PICC line and has an appointment to see Dr. Samara Deist for a podiatry opinion. 04/07/2016 -- he was seen by Dr. Samara Deist on 03/31/2016 -- his assessment was that of diabetes mellitus with polyneuropathy and ulcer of the foot limited to breakdown of skin. He had debrided the wound and nails and asked him to come back in 6 weeks. no x-rays were taken during the podiatry visit. Objective Constitutional Pulse regular. Respirations normal and unlabored. Afebrile. Vitals Time Taken: 10:05 AM, Height: 74 in, Weight: 242 lbs, BMI: 31.1, Temperature: 98.4 F, Pulse: 76 bpm, Blood Pressure: 158/79 mmHg. Willie Krueger, Willie Krueger [place]. ([id number]) Eyes Nonicteric. Reactive to light. Ears, Nose, Mouth, and Throat Lips, teeth, and gums WNL.Marland Kitchen Moist mucosa without lesions. Neck supple and nontender. No palpable supraclavicular or cervical adenopathy. Normal sized without goiter. Respiratory WNL. No retractions.. Cardiovascular Pedal Pulses WNL. No clubbing, cyanosis or edema. Lymphatic No adneopathy. No adenopathy. No adenopathy. Musculoskeletal Adexa  without tenderness or enlargement.. Digits and nails w/o clubbing, cyanosis, infection, petechiae, ischemia, or inflammatory conditions.Marland Kitchen Psychiatric Judgement and insight Intact.. No evidence of depression, anxiety, or agitation.. General Notes: the wound is completely closed and there is good supple scar tissue with no evidence of cellulitis or any drainage from this area. Integumentary (Hair, Skin) No suspicious lesions. No crepitus or fluctuance. No peri-wound warmth or erythema. No masses.. Wound #1 status is Open. Original cause of wound was Gradually Appeared. The wound is located on the Right Toe Great. The wound measures 0cm length x 0cm width x 0cm depth; 0cm^2 area and 0cm^3 volume. There is bone exposed. There is a medium amount of serosanguineous drainage noted. The wound margin is thickened. There is no granulation within the wound bed. There is no necrotic tissue within the wound bed. The periwound skin appearance exhibited: Callus, Dry/Scaly. 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 offloading with a healing sandal 2. IV antibiotics as per Dr. Megan Salon 3. See Dr. Samara Deist in follow-up regarding his podiatry options 4. see me back next week in follow-up --on a when necessary basis as he has been discharged from the wound care center. Plan Wound Cleansing: Wound #1 Right Toe Great: Cleanse wound with  mild soap and water May Shower, gently pat wound dry prior to applying new dressing. Primary Wound Dressing: Other: - may apply a band-aid  over area to ensure there no drainage coming from wound site, If any drainage noted return to clinic Follow-up Appointments: Other: - If any drainage noted return to clinic Off-Loading: Wound #1 Right Toe Great: Other: - continue with darco shoe Additional Orders / Instructions: Wound #1 Right Toe Great: Stop Smoking Other: - follow up with Dr Celesta Gentile, podiatry Discharge From [facility] Services: Wound #1 Right Toe Great: Discharge from [place] - If any drainage noted return to clinic. Follow up with Dr Megan Salon and Dr Vickki Muff having completely healed his wound and being under the care of podiatry and infectious disease, he does not have any ulcerated area on his right big toe and he will continue with: 1. offloading with a healing sandal Willie Krueger, Willie Krueger. ([id number]) 2. IV antibiotics as per Dr. Megan Salon 3. See Dr. Samara Deist in follow-up regarding his podiatry options 4. see me back next week in follow-up --on a when necessary basis as he has been discharged from the wound care center. Electronic Signature(s) Signed: 04/07/2016 10:51:10 AM By: Christin Fudge MD, FACS Entered By: Christin Fudge on 04/07/2016 10:51:10 WULFRIC, FLASHER ([id number]) -------------------------------------------------------------------------------- SuperBill Details Patient Name: ZEKHI, NOGGLE. Date of Service: 04/07/2016 Medical Record Number: [id number] Patient Account Number: [id number] Date of Birth/Sex: 02-21-1956 (60 y.o. Male) Treating RN: Macarthur Critchley Primary Care Physician: Juanell Fairly Other Clinician: Referring Physician: Juanell Fairly Treating Physician/Extender: Frann Rider in Treatment: 16 Diagnosis Coding ICD-10 Codes Code Description E11.621 Type 2 diabetes mellitus with foot ulcer L97.514 Non-pressure chronic ulcer of other  part of right foot with necrosis of bone M86.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, right ankle and foot F17.218 Nicotine dependence, cigarettes, with other nicotine-induced disorders L84 Corns and callosities Facility Procedures CPT4 Code: [id number] Description: 99213 - WOUND CARE VISIT-LEV 3 EST PT Modifier: Quantity: 1 Physician Procedures CPT4 Code Description: [id number] - WC PHYS LEVEL 3 - EST PT ICD-10 Description Diagnosis E11.621 Type 2 diabetes mellitus with foot ulcer L97.514 Non-pressure chronic ulcer of other part of right f M86.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, right  ankle and f F17.218 Nicotine dependence, cigarettes, with other nicotin Modifier: oot with necros oot e-induced disor Quantity: 1 is of bone ders Electronic Signature(s) Signed: 04/07/2016 10:51:45 AM By: Christin Fudge MD, FACS Entered By: Christin Fudge on 04/07/2016 10:51:45

## 2016-04-14 ENCOUNTER — Encounter: Payer: Self-pay | Admitting: Internal Medicine

## 2016-04-14 ENCOUNTER — Ambulatory Visit (INDEPENDENT_AMBULATORY_CARE_PROVIDER_SITE_OTHER): Payer: 59 | Admitting: Internal Medicine

## 2016-04-14 ENCOUNTER — Telehealth: Payer: Self-pay | Admitting: *Deleted

## 2016-04-14 VITALS — BP 152/81 | HR 85 | Temp 97.8°F | Wt 229.0 lb

## 2016-04-14 DIAGNOSIS — E1169 Type 2 diabetes mellitus with other specified complication: Secondary | ICD-10-CM

## 2016-04-14 DIAGNOSIS — M869 Osteomyelitis, unspecified: Secondary | ICD-10-CM | POA: Diagnosis not present

## 2016-04-14 MED ORDER — VANCOMYCIN HCL IN DEXTROSE 1-5 GM/200ML-% IV SOLN: 1000.0000 mg | INTRAVENOUS | Status: AC

## 2016-04-14 MED ORDER — SODIUM CHLORIDE 0.9 % IV SOLN: 1.0000 g | INTRAVENOUS | Status: AC

## 2016-04-14 NOTE — Progress Notes (Signed)
[facility]  Patient Active Problem List   Diagnosis Date Noted  . Diabetic osteomyelitis ([place]) 12/05/2015    Priority: High  . Obesity 12/05/2015    Priority: Medium  . Chronic pain 10/31/2015    Priority: Medium  . HLD (hyperlipidemia) 10/20/2012    Priority: Medium  . Current smoker 10/02/2011    Priority: Medium  . Neurogenic pain 02/27/2016  . Diabetic peripheral neuropathy ([place]) 02/27/2016  . Chronic shoulder pain (Location of Tertiary source of pain) (Bilateral) (R>L) 02/27/2016  . Type 2 diabetes mellitus with ulcer ([place]) 01/23/2016  . Neuropathy ([place]) 12/05/2015  . Encounter for chronic pain management 12/03/2015  . Elevated C-reactive protein (CRP) 12/03/2015  . Long term current use of opiate analgesic 10/31/2015  . Long term prescription opiate use 10/31/2015  . Opiate use (112.5 MME/Day) 10/31/2015  . Opiate dependence ([place]) 10/31/2015  . Encounter for therapeutic drug level monitoring 10/31/2015  . Chronic neck pain (Right side) 10/31/2015  . Cervical spondylosis 10/31/2015  . Cervical post-laminectomy syndrome 10/31/2015  . Chronic low back pain (Location of Primary Source of Pain) (Midline pain) (R>L) 10/31/2015  . Chronic cervical radicular pain (Bilateral) (R>L) 10/31/2015  . Chronic lumbar radicular pain (Bilateral) (R>L) 10/31/2015  . Substance use disorder Risk: LOW 10/31/2015  . 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 Chronic lower extremity pain (Location of Secondary source of pain) (Bilateral) (R>L) 10/31/2015  . History of carpal tunnel release of both wrists 10/31/2015  . Grade I anterolisthesis of C7 on T1 10/31/2015  . Toe ulcer ([place]) 06/24/2015  . Cervical spinal cord compression ([place]) 02/07/2015  . MI (mitral incompetence) 11/11/2014  . Cardiac murmur 11/09/2014  . History of decompression of median nerve 05/19/2014  . Benign hypertension 10/20/2012  . ED (erectile dysfunction) of organic origin  10/20/2012  . Chronic hepatitis C virus infection ([place]) 10/02/2011  . Adiposity 10/02/2011    Patient's Medications  New Prescriptions   No medications on file  Previous Medications   ACETAMINOPHEN (TYLENOL) 500 MG TABLET    Take 500 mg by mouth every 4 (four) hours. Scheduled (5 times daily)   ASPIRIN EC 81 MG TABLET    Take 81 mg by mouth daily.   CHOLECALCIFEROL (VITAMIN D3) 2000 UNITS CAPSULE    Take 2,000 Units by mouth daily.    DIPHENHYDRAMINE-ACETAMINOPHEN (TYLENOL PM) 25-500 MG TABS TABLET    Take 2 tablets by mouth at bedtime. Reported on 03/17/2016   FUROSEMIDE (LASIX) 20 MG TABLET    Take 40 mg by mouth 2 (two) times daily.    GABAPENTIN (NEURONTIN) 600 MG TABLET    Take 2 tablets (1,200 mg total) by mouth every 6 (six) hours as needed.   GLIMEPIRIDE (AMARYL) 2 MG TABLET    Take 2 mg by mouth daily with breakfast.    LISINOPRIL (PRINIVIL,ZESTRIL) 40 MG TABLET    Take 40 mg by mouth daily.   METFORMIN (GLUCOPHAGE-XR) 500 MG 24 HR TABLET    Take 500 mg by mouth 2 (two) times daily.   NALOXONE HCL (NARCAN) 4 MG/0.1ML LIQD    Place 1 spray into the nose every 5 (five) minutes as needed (for pain medication overdose.). Spray half into each nostril, then call 911   OXYCODONE (ROXICODONE) 15 MG IMMEDIATE RELEASE TABLET    Take 1 tablet (15 mg total) by mouth 5 (five) times daily as needed for pain.   PRAVASTATIN (PRAVACHOL) 20 MG TABLET  Take 20 mg by mouth at bedtime.    SILDENAFIL (VIAGRA) 100 MG TABLET    Take 100 mg by mouth daily as needed for erectile dysfunction.  Modified Medications   Modified Medication Previous Medication   ERTAPENEM 1 G IN SODIUM CHLORIDE 0.9 % 50 ML ertapenem 1 g in sodium chloride 0.9 % 50 mL      Inject 1 g into the vein daily.    Inject 1 g into the vein daily.   VANCOMYCIN (VANCOCIN) 1 GM/200ML SOLN vancomycin (VANCOCIN) 1 GM/200ML SOLN      Inject 200 mLs (1,000 mg total) into the vein daily.    Inject 200 mLs (1,000 mg total) into the vein daily.    Discontinued Medications   No medications on file    Subjective: Mchale is in for his routine follow-up visit. He is now completed 35 days of IV vancomycin and ertapenem for his persistent right great toe osteomyelitis. He has had no problems tolerating his antibiotics or PICC. His foot is feeling better and is less swollen. He is now able to wear his regular shoes. He saw Dr. Holley Raring, a podiatrist at [facility] clinic recently. Dr. Vickki Muff trimmed his toenails and said that it appeared that he was doing much better.  Review of Systems: Review of Systems  Constitutional: Negative for fever and chills.  Respiratory: Negative for cough and shortness of breath.   Cardiovascular: Negative for chest pain.  Gastrointestinal: Negative for nausea, vomiting, abdominal pain and diarrhea.  Musculoskeletal: Negative for joint pain.    Past Medical History  Diagnosis Date  . Diabetes mellitus without complication ([place])   . Hypertension   . Chronic pain   . Hyperlipidemia   . Osteoarthritis   . Heart murmur   . Mitral valve regurgitation   . History of spinal stenosis 10/31/2015    C3-4 with severe central canal stenosis with small amount of myelomalacia present. Moderate to severe canal stenosis is also present at C4-5 and C5-6. Moderate to severe neuroforaminal narrowing between the levels of C2-C7  . Leg weakness 07/07/2014  . Nerve root inflammation 06/26/2014    Last Assessment & Plan:  Status is unchanged with ongoing pain and limited mobility. Continue with physical therapy and pain management.   . Chronic pain associated with significant psychosocial dysfunction 05/04/2015    Last Assessment & Plan:  He is having worsening problems with breakthrough pain. Gabapentin is increased from 800 mg 3 times daily to 1200 mg 3 times daily. Oxycodone is also renewed. He continues to through evaluation at [facility] pain clinic.     Social History  Substance Use Topics  . Smoking status: Current Every  Day Smoker -- 1.50 packs/day    Types: Cigarettes  . Smokeless tobacco: Never Used  . Alcohol Use: No    Family History  Problem Relation Age of Onset  . Cancer Mother   . Diabetes Mother   . Heart disease Father     Allergies  Allergen Reactions  . Cleocin [Clindamycin] Rash  . Sulfa Antibiotics Rash    Objective: Filed Vitals:   04/14/16 1353  BP: 152/81  Pulse: 85  Temp: 97.8 F (36.6 C)  TempSrc: Oral  Weight: 229 lb (103.874 kg)   Body mass index is 29.39 kg/(m^2).  Physical Exam  Constitutional:  He is in good spirits as usual.  Musculoskeletal:  Swelling and redness of his right foot and specifically his right great toe is markedly improved. He still has some  superficial callus over the distal toe but no open, draining areas.  Skin:  Right arm PICC looks good.    Lab Results SED RATE (mm/hr)  Date Value  03/17/2016 6  02/27/2016 9  02/20/2016 11   CRP (mg/dL)  Date Value  03/17/2016 1.2*  02/27/2016 1.8*  02/20/2016 1.3*     Problem List Items Addressed This Visit      High   Diabetic osteomyelitis ([place]) - Primary    He is improving on his second round of IV antibiotics for right great toe osteomyelitis. He continues to smoke cigarettes and never agreed to starting hyperbaric oxygen. Nonetheless he is getting better. I will have him complete 1 more week of therapy. He will follow-up here in 6 weeks.      Relevant Medications   ertapenem 1 g in sodium chloride 0.9 % 50 mL   vancomycin (VANCOCIN) 1 GM/200ML SOLN       Michel Bickers, MD [facility] [phone] pager   [phone] cell 04/14/2016, 2:09 PM

## 2016-04-14 NOTE — Assessment & Plan Note (Signed)
He is improving on his second round of IV antibiotics for right great toe osteomyelitis. He continues to smoke cigarettes and never agreed to starting hyperbaric oxygen. Nonetheless he is getting better. I will have him complete 1 more week of therapy. He will follow-up here in 6 weeks.

## 2016-04-14 NOTE — Telephone Encounter (Signed)
Per Dr Megan Salon called patient nurse at [facility] [phone] to advise that at visit 04/21/16 she can D/C his PICC and had to leave a message. Advised her to call the office if she needs a written order.

## 2016-05-28 ENCOUNTER — Encounter: Payer: Self-pay | Admitting: Pain Medicine

## 2016-05-28 ENCOUNTER — Ambulatory Visit: Payer: 59 | Attending: Pain Medicine | Admitting: Pain Medicine

## 2016-05-28 VITALS — BP 132/74 | HR 65 | Temp 98.2°F | Resp 16 | Ht 74.0 in | Wt 227.0 lb

## 2016-05-28 DIAGNOSIS — G9520 Unspecified cord compression: Secondary | ICD-10-CM | POA: Insufficient documentation

## 2016-05-28 DIAGNOSIS — Z7984 Long term (current) use of oral hypoglycemic drugs: Secondary | ICD-10-CM | POA: Insufficient documentation

## 2016-05-28 DIAGNOSIS — M25512 Pain in left shoulder: Secondary | ICD-10-CM | POA: Diagnosis not present

## 2016-05-28 DIAGNOSIS — R011 Cardiac murmur, unspecified: Secondary | ICD-10-CM | POA: Diagnosis not present

## 2016-05-28 DIAGNOSIS — E669 Obesity, unspecified: Secondary | ICD-10-CM | POA: Diagnosis not present

## 2016-05-28 DIAGNOSIS — M542 Cervicalgia: Secondary | ICD-10-CM | POA: Insufficient documentation

## 2016-05-28 DIAGNOSIS — G8929 Other chronic pain: Secondary | ICD-10-CM | POA: Insufficient documentation

## 2016-05-28 DIAGNOSIS — M79606 Pain in leg, unspecified: Secondary | ICD-10-CM | POA: Insufficient documentation

## 2016-05-28 DIAGNOSIS — N529 Male erectile dysfunction, unspecified: Secondary | ICD-10-CM | POA: Diagnosis not present

## 2016-05-28 DIAGNOSIS — I10 Essential (primary) hypertension: Secondary | ICD-10-CM | POA: Diagnosis not present

## 2016-05-28 DIAGNOSIS — E1140 Type 2 diabetes mellitus with diabetic neuropathy, unspecified: Secondary | ICD-10-CM | POA: Diagnosis not present

## 2016-05-28 DIAGNOSIS — L97509 Non-pressure chronic ulcer of other part of unspecified foot with unspecified severity: Secondary | ICD-10-CM | POA: Insufficient documentation

## 2016-05-28 DIAGNOSIS — Z5181 Encounter for therapeutic drug level monitoring: Secondary | ICD-10-CM | POA: Diagnosis not present

## 2016-05-28 DIAGNOSIS — M792 Neuralgia and neuritis, unspecified: Secondary | ICD-10-CM

## 2016-05-28 DIAGNOSIS — E785 Hyperlipidemia, unspecified: Secondary | ICD-10-CM | POA: Diagnosis not present

## 2016-05-28 DIAGNOSIS — M5412 Radiculopathy, cervical region: Secondary | ICD-10-CM

## 2016-05-28 DIAGNOSIS — M545 Low back pain: Secondary | ICD-10-CM | POA: Insufficient documentation

## 2016-05-28 DIAGNOSIS — Z79891 Long term (current) use of opiate analgesic: Secondary | ICD-10-CM | POA: Insufficient documentation

## 2016-05-28 DIAGNOSIS — F1190 Opioid use, unspecified, uncomplicated: Secondary | ICD-10-CM

## 2016-05-28 DIAGNOSIS — F17210 Nicotine dependence, cigarettes, uncomplicated: Secondary | ICD-10-CM | POA: Diagnosis not present

## 2016-05-28 DIAGNOSIS — M961 Postlaminectomy syndrome, not elsewhere classified: Secondary | ICD-10-CM | POA: Diagnosis not present

## 2016-05-28 DIAGNOSIS — M25511 Pain in right shoulder: Secondary | ICD-10-CM

## 2016-05-28 DIAGNOSIS — I340 Nonrheumatic mitral (valve) insufficiency: Secondary | ICD-10-CM | POA: Diagnosis not present

## 2016-05-28 DIAGNOSIS — B1920 Unspecified viral hepatitis C without hepatic coma: Secondary | ICD-10-CM | POA: Insufficient documentation

## 2016-05-28 DIAGNOSIS — M539 Dorsopathy, unspecified: Secondary | ICD-10-CM

## 2016-05-28 DIAGNOSIS — M47892 Other spondylosis, cervical region: Secondary | ICD-10-CM | POA: Diagnosis not present

## 2016-05-28 DIAGNOSIS — M5416 Radiculopathy, lumbar region: Secondary | ICD-10-CM | POA: Insufficient documentation

## 2016-05-28 DIAGNOSIS — M549 Dorsalgia, unspecified: Secondary | ICD-10-CM | POA: Diagnosis present

## 2016-05-28 DIAGNOSIS — M47816 Spondylosis without myelopathy or radiculopathy, lumbar region: Secondary | ICD-10-CM | POA: Insufficient documentation

## 2016-05-28 DIAGNOSIS — R937 Abnormal findings on diagnostic imaging of other parts of musculoskeletal system: Secondary | ICD-10-CM | POA: Insufficient documentation

## 2016-05-28 MED ORDER — GABAPENTIN 600 MG PO TABS: 1200.0000 mg | ORAL_TABLET | Freq: Four times a day (QID) | ORAL | Status: DC | PRN

## 2016-05-28 MED ORDER — NALOXONE HCL 2 MG/2ML IJ SOSY: PREFILLED_SYRINGE | INTRAMUSCULAR | Status: DC

## 2016-05-28 MED ORDER — OXYCODONE HCL 15 MG PO TABS: 15.0000 mg | ORAL_TABLET | Freq: Every day | ORAL | Status: DC | PRN

## 2016-05-28 NOTE — Progress Notes (Signed)
Patient's Name: Willie Krueger  Patient type: Established  MRN: [id number]  Service setting: Ambulatory outpatient  DOB: Dec 11, 1956  Location: [facility]  DOS: 05/28/2016  Primary Care Physician: No primary care provider on file.  Note by: Meiya Wisler A. Dossie Arbour, M.D, DABA, DABAPM, DABPM, Milagros Evener, [place]  Referring Physician: Juanell Fairly, MD  Specialty: Board-Certified Interventional Pain Management  Last Visit to Pain Management: 02/27/2016   Primary Reason(s) for Visit: Encounter for prescription drug management (Level of risk: moderate) CC: Back Pain; Leg Pain; Arm Pain; Hand Pain; Neck Pain; and Foot Pain   HPI  Mr. Umansky is a 60 y.o. year old, male patient, who returns today as an established patient. He has Toe ulcer ([place]); Cervical spinal cord compression ([place]); Cardiac murmur; Chronic hepatitis C virus infection ([place]); HLD (hyperlipidemia); Benign hypertension; ED (erectile dysfunction) of organic origin; MI (mitral incompetence); Adiposity; Current smoker; History of decompression of median nerve; Chronic pain; Long term current use of opiate analgesic; Long term prescription opiate use; Opiate use (112.5 MME/Day); Opiate dependence ([place]); Encounter for therapeutic drug level monitoring; Chronic neck pain (Right side); Cervical spondylosis; Cervical post-laminectomy syndrome; Chronic low back pain (Location of Primary Source of Pain) (Midline pain) (R>L); Chronic cervical radicular pain (Bilateral) (R>L); Chronic lumbar radicular pain (Bilateral) (R>L); Substance use disorder Risk: LOW; Failed back surgical syndrome; Chronic lower extremity pain (Location of Secondary source of pain) (Bilateral) (R>L); History of carpal tunnel release of both wrists; Grade I anterolisthesis of C7 on T1; Encounter for chronic pain management; Elevated C-reactive protein (CRP); Diabetic osteomyelitis ([place]); Neuropathy ([place]); Obesity; Neurogenic pain; 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 His primarily concern today is the Back Pain; Leg Pain; Arm Pain; Hand Pain; Neck Pain; and Foot Pain   Pain Assessment: Self-Reported Pain Score: 5 , clinically he looks like a 2/10. Reported level is inconsistent with clinical obrservations Pain Type: Chronic pain Pain Location: Neck Pain Orientation: Lower Pain Descriptors / Indicators: Aching, Burning (steady) Pain Frequency: Constant  The patient comes into the clinics today for pharmacological management of his chronic pain. I last saw this patient on 02/27/2016. The patient  reports that he does not use illicit drugs. His body mass index is 29.13 kg/(m^2).  Date of Last Visit: 02/27/16 Service Provided on Last Visit: Med Refill  Controlled Substance Pharmacotherapy Assessment & REMS (Risk Evaluation and Mitigation Strategy)  Analgesic: Oxycodone IR 15 mg 5 times a day (75 mg/day) Pill Count: #12 out of 120 Oxycodone 15 mg remaining. Filled 05-06-16. MME/day: 112.5 mg/day Pharmacokinetics: Onset of action (Liberation/Absorption): Within expected pharmacological parameters Time to Peak effect (Distribution): Timing and results are as within normal expected parameters Duration of action (Metabolism/Excretion): Within normal limits for medication Pharmacodynamics: Analgesic Effect: More than 50% Activity Facilitation: Medication(s) allow patient to sit, stand, walk, and do the basic ADLs Perceived Effectiveness: Described as relatively effective, allowing for increase in activities of daily living (ADL) Side-effects or Adverse reactions: None reported Monitoring: [place] PMP: Online review of the past 19-month period conducted. Compliant with practice rules and regulations Last UDS on  record: TOXASSURE SELECT 13  Date Value Ref Range Status  02/27/2016 FINAL  Final    Comment:    ==================================================================== TOXASSURE SELECT 13 (MW) ==================================================================== Test  Result       Flag       Units Drug Present and Declared for Prescription Verification   Oxycodone                      2581         EXPECTED   ng/mg creat   Oxymorphone                    872          EXPECTED   ng/mg creat   Noroxycodone                   3949         EXPECTED   ng/mg creat   Noroxymorphone                 390          EXPECTED   ng/mg creat    Sources of oxycodone are scheduled prescription medications.    Oxymorphone, noroxycodone, and noroxymorphone are expected    metabolites of oxycodone. Oxymorphone is also available as a    scheduled prescription medication. ==================================================================== Test                      Result    Flag   Units      Ref Range   Creatinine              185              mg/dL      >=20 ==================================================================== Declared Medications:  The flagging and interpretation on this report are based on the  following declared medications.  Unexpected results may arise from  inaccuracies in the declared medications.  **Note: The testing scope of this panel includes these medications:  Oxycodone (Roxicodone)  **Note: The testing scope of this panel does not include following  reported medications:  Acetaminophen (Tylenol)  Acetaminophen (Tylenol PM)  Amoxicillin (Augmentin)  Aspirin (Aspirin 81)  Cefadroxil (Duricef)  Ciprofloxacin (Cipro)  Clavulinate (Augmentin)  Diphenhydramine (Tylenol PM)  Furosemide (Lasix)  Gabapentin (Neurontin)  Glimepiride (Amaryl)  Ketoconazole (Nizoral)  Lisinopril  Metformin  Metformin (Fortamet)  Metronidazole (Flagyl)  Pravastatin  (Pravachol)  Sildenafil (Viagra)  Sulfamethoxazole (Bactrim)  Trimethoprim (Bactrim)  Vitamin D3 ==================================================================== For clinical consultation, please call [phone]. ====================================================================    UDS interpretation: Compliant Medication Assessment Form: Reviewed. Patient indicates being compliant with therapy Treatment compliance: Compliant. Did not get his Narcan filled because it was too expensive. Risk Assessment: Aberrant Behavior: None observed today Substance Use Disorder (SUD) Risk Level: Low-to-moderate Risk of opioid abuse or dependence: 0.7-3.0% with doses ? 36 MME/day and 6.1-26% with doses ? 120 MME/day. Opioid Risk Tool (ORT) Score: Total Score: 0 Low Risk for SUD (Score <3) Depression Scale Score: PHQ-2: PHQ-2 Total Score: 0 No depression (0) PHQ-9: PHQ-9 Total Score: 0 No depression (0-4)  Pharmacologic Plan: No change in therapy, at this time  Previous Illicit Drug Screen Labs(s): No results found for: MDMA, COCAINSCRNUR, PCPSCRNUR, [facility]  Laboratory Chemistry  Inflammation Markers Lab Results  Component Value Date   ESRSEDRATE 6 03/17/2016   CRP 1.2* 03/17/2016    Renal Function Lab Results  Component Value Date   BUN 16 03/04/2016   CREATININE 0.98 03/04/2016   GFRAA >60 03/04/2016   GFRNONAA >60 03/04/2016    Hepatic Function Lab Results  Component Value Date   AST 23  03/04/2016   ALT 23 03/04/2016   ALBUMIN 3.4* 03/04/2016    Electrolytes Lab Results  Component Value Date   NA 138 03/04/2016   K 4.8 03/04/2016   CL 107 03/04/2016   CALCIUM 9.7 03/04/2016   MG 1.9 02/27/2016    Pain Modulating Vitamins No results found for: VD25OH, VD125OH2TOT, H157544, [id number], VITAMINB12  Coagulation Parameters Lab Results  Component Value Date   PLT 177 03/04/2016    Note: Labs reviewed. and Results made available to patient  Recent  Diagnostic Imaging  Ir Fluoro Guide Cv Line Right  03/10/2016  INDICATION: Osteomyelitis of the right foot. Request PICC line placement for prolonged IV antibiotics. EXAM: PICC LINE PLACEMENT WITH ULTRASOUND AND FLUOROSCOPIC GUIDANCE MEDICATIONS: None; ANESTHESIA/SEDATION: Moderate Sedation Time:  None The patient was continuously monitored during the procedure by the interventional radiology nurse under my direct supervision. FLUOROSCOPY TIME:  Fluoroscopy Time: 12 seconds COMPLICATIONS: None immediate. PROCEDURE: The patient was advised of the possible risks and complications and agreed to undergo the procedure. The patient was then brought to the angiographic suite for the procedure. The right arm was prepped with chlorhexidine, draped in the usual sterile fashion using maximum barrier technique (cap and mask, sterile gown, sterile gloves, large sterile sheet, hand hygiene and cutaneous antiseptic). Local anesthesia was attained by infiltration with 1% lidocaine. Ultrasound demonstrated patency of the brachial vein, and this was documented with an image. Under real-time ultrasound guidance, this vein was accessed with a 21 gauge micropuncture needle and image documentation was performed. The needle was exchanged over a guidewire for a peel-away sheath through which a 43 cm 5 [place] single lumen power injectable PICC was advanced, and positioned with its tip at the lower SVC/right atrial junction. Fluoroscopy during the procedure and fluoro spot radiograph confirms appropriate catheter position. The catheter was flushed, secured to the skin with Prolene sutures, and covered with a sterile dressing. IMPRESSION: Successful placement of a right arm PICC with sonographic and fluoroscopic guidance. The catheter is ready for use. Read by: Ascencion Dike PA-C Electronically Signed   By: Corrie Mckusick D.O.   On: 03/10/2016 10:19   Ir US Guide Vasc Access Right  03/10/2016  INDICATION: Osteomyelitis of the right foot.  Request PICC line placement for prolonged IV antibiotics. EXAM: PICC LINE PLACEMENT WITH ULTRASOUND AND FLUOROSCOPIC GUIDANCE MEDICATIONS: None; ANESTHESIA/SEDATION: Moderate Sedation Time:  None The patient was continuously monitored during the procedure by the interventional radiology nurse under my direct supervision. FLUOROSCOPY TIME:  Fluoroscopy Time: 12 seconds COMPLICATIONS: None immediate. PROCEDURE: The patient was advised of the possible risks and complications and agreed to undergo the procedure. The patient was then brought to the angiographic suite for the procedure. The right arm was prepped with chlorhexidine, draped in the usual sterile fashion using maximum barrier technique (cap and mask, sterile gown, sterile gloves, large sterile sheet, hand hygiene and cutaneous antiseptic). Local anesthesia was attained by infiltration with 1% lidocaine. Ultrasound demonstrated patency of the brachial vein, and this was documented with an image. Under real-time ultrasound guidance, this vein was accessed with a 21 gauge micropuncture needle and image documentation was performed. The needle was exchanged over a guidewire for a peel-away sheath through which a 43 cm 5 [place] single lumen power injectable PICC was advanced, and positioned with its tip at the lower SVC/right atrial junction. Fluoroscopy during the procedure and fluoro spot radiograph confirms appropriate catheter position. The catheter was flushed, secured to the skin with Prolene sutures, and covered  with a sterile dressing. IMPRESSION: Successful placement of a right arm PICC with sonographic and fluoroscopic guidance. The catheter is ready for use. Read by: Ascencion Dike PA-C Electronically Signed   By: Corrie Mckusick D.O.   On: 03/10/2016 10:19    Meds  The patient has a current medication list which includes the following prescription(s): acetaminophen, aspirin ec, vitamin d3, diphenhydramine-acetaminophen, furosemide, gabapentin,  glimepiride, lisinopril, metformin, oxycodone, oxycodone, oxycodone, pravastatin, sildenafil, and naloxone.  Current Outpatient Prescriptions on File Prior to Visit  Medication Sig  . acetaminophen (TYLENOL) 500 MG tablet Take 500 mg by mouth every 4 (four) hours. Scheduled (5 times daily)  . aspirin EC 81 MG tablet Take 81 mg by mouth daily.  . Cholecalciferol (VITAMIN D3) 2000 units capsule Take 2,000 Units by mouth daily.   . diphenhydramine-acetaminophen (TYLENOL PM) 25-500 MG TABS tablet Take 2 tablets by mouth at bedtime. Reported on 03/17/2016  . furosemide (LASIX) 20 MG tablet Take 40 mg by mouth 2 (two) times daily.   Marland Kitchen glimepiride (AMARYL) 2 MG tablet Take 2 mg by mouth daily with breakfast.   . lisinopril (PRINIVIL,ZESTRIL) 40 MG tablet Take 40 mg by mouth daily.  . metFORMIN (GLUCOPHAGE-XR) 500 MG 24 hr tablet Take 500 mg by mouth 2 (two) times daily.  . pravastatin (PRAVACHOL) 20 MG tablet Take 20 mg by mouth at bedtime.   . sildenafil (VIAGRA) 100 MG tablet Take 100 mg by mouth daily as needed for erectile dysfunction.   No current facility-administered medications on file prior to visit.    ROS  Constitutional: Denies any fever or chills Gastrointestinal: No reported hemesis, hematochezia, vomiting, or acute GI distress Musculoskeletal: Denies any acute onset joint swelling, redness, loss of ROM, or weakness Neurological: No reported episodes of acute onset apraxia, aphasia, dysarthria, agnosia, amnesia, paralysis, loss of coordination, or loss of consciousness  Allergies  Mr. Kooiker is allergic to cleocin and sulfa antibiotics.  [place]  Medical:  Mr. Granillo  has a past medical history of Diabetes mellitus without complication ([place]); Hypertension; Chronic pain; Hyperlipidemia; Osteoarthritis; Heart murmur; Mitral valve regurgitation; History of spinal stenosis (10/31/2015); Leg weakness (07/07/2014); Nerve root inflammation (06/26/2014); and Chronic pain associated with  significant psychosocial dysfunction (05/04/2015). Family: family history includes Cancer in his mother; Diabetes in his mother; Heart disease in his father. Surgical:  has past surgical history that includes Carpal tunnel release and Spine surgery. Tobacco:  reports that he has been smoking Cigarettes.  He has been smoking about 1.50 packs per day. He has never used smokeless tobacco. Alcohol:  reports that he does not drink alcohol. Drug:  reports that he does not use illicit drugs.  Constitutional Exam  Vitals: Blood pressure 132/74, pulse 65, temperature 98.2 F (36.8 C), temperature source Oral, resp. rate 16, height 6\' 2"  (1.88 m), weight 227 lb (102.967 kg), SpO2 98 %. General appearance: Well nourished, well developed, and well hydrated. In no acute distress Calculated BMI/Body habitus: Body mass index is 29.13 kg/(m^2). (25-29.9 kg/m2) Overweight - 20% higher incidence of chronic pain Psych/Mental status: Alert and oriented x 3 (person, place, & time) Eyes: PERLA Respiratory: No evidence of acute respiratory distress  Cervical Spine Exam  Inspection: No masses, redness, or swelling Alignment: Symmetrical ROM: Functional: ROM is within functional limits [facility]) Stability: No instability detected Muscle strength & Tone: Functionally intact Sensory: Unimpaired Palpation: No complaints of tenderness  Upper Extremity (UE) Exam    Side: Right upper extremity  Side: Left upper extremity  Inspection:  No masses, redness, swelling, or asymmetry  Inspection: No masses, redness, swelling, or asymmetry  ROM:  ROM:  Functional: ROM is within functional limits [facility])  Functional: ROM is within functional limits [facility])  Muscle strength & Tone: Functionally intact  Muscle strength & Tone: Functionally intact  Sensory: Unimpaired  Sensory: Unimpaired  Palpation: Non-contributory  Palpation: Non-contributory   Thoracic Spine Exam  Inspection: No masses, redness, or swelling Alignment:  Symmetrical ROM: Functional: ROM is within functional limits [facility]) Stability: No instability detected Sensory: Unimpaired Muscle strength & Tone: Functionally intact Palpation: No complaints of tenderness  Lumbar Spine Exam  Inspection: No masses, redness, or swelling Alignment: Symmetrical ROM: Functional: ROM is within functional limits [facility]) Stability: No instability detected Muscle strength & Tone: Functionally intact Sensory: Unimpaired Palpation: No complaints of tenderness Provocative Tests: Lumbar Hyperextension and rotation test: deferred Patrick's Maneuver: deferred  Gait & Posture Assessment  Ambulation: Unassisted Gait: Unaffected Posture: WNL  Lower Extremity Exam    Side: Right lower extremity  Side: Left lower extremity  Inspection: No masses, redness, swelling, or asymmetry ROM:  Inspection: No masses, redness, swelling, or asymmetry ROM:  Functional: ROM is within functional limits [facility])  Functional: ROM is within functional limits [facility])  Muscle strength & Tone: Functionally intact  Muscle strength & Tone: Functionally intact  Sensory: Unimpaired  Sensory: Unimpaired  Palpation: Non-contributory  Palpation: Non-contributory   Assessment & Plan  Primary Diagnosis & Pertinent Problem List: The primary encounter diagnosis was Chronic pain. Diagnoses of Encounter for therapeutic drug level monitoring, Chronic low back pain (Location of Primary Source of Pain) (Midline pain) (R>L), Chronic pain of lower extremity, unspecified laterality, Chronic lumbar radicular pain (Bilateral) (R>L), Chronic neck pain (Right side), Chronic shoulder pain (Location of Tertiary source of pain) (Bilateral) (R>L), Failed back surgical syndrome, Chronic cervical radicular pain (Bilateral) (R>L), Cervical post-laminectomy syndrome, Opiate use (112.5 MME/Day), Long term current use of opiate analgesic, Neurogenic pain, Facet syndrome, lumbar, and Abnormal MRI, lumbar spine (05/19/2016)  were also pertinent to this visit.  Visit Diagnosis: 1. Chronic pain   2. Encounter for therapeutic drug level monitoring   3. Chronic low back pain (Location of Primary Source of Pain) (Midline pain) (R>L)   4. Chronic pain of lower extremity, unspecified laterality   5. Chronic lumbar radicular pain (Bilateral) (R>L)   6. Chronic neck pain (Right side)   7. Chronic shoulder pain (Location of Tertiary source of pain) (Bilateral) (R>L)   8. Failed back surgical syndrome   9. Chronic cervical radicular pain (Bilateral) (R>L)   10. Cervical post-laminectomy syndrome   11. Opiate use (112.5 MME/Day)   12. Long term current use of opiate analgesic   13. Neurogenic pain   14. Facet syndrome, lumbar   15. Abnormal MRI, lumbar spine (05/19/2016)     Problems updated and reviewed during this visit: Problem  Lumbar facet syndrome (Location of Primary Source of Pain) (Bilateral) (R>L)  Abnormal MRI, lumbar spine (05/19/2016)   EXAM: Magnetic resonance imaging, lumbar spine, without contrast material. DATE: 05/19/2016 10:29 AM ACCESSION: [id number] UN DICTATED: 05/19/2016 10:51 AM INTERPRETATION LOCATION: [facility] CLINICAL INDICATION: 60 years old Male with LUMBAR RADICULOPATHY. COMPARISON: 07/05/2014. TECHNIQUE: Multiplanar MRI was performed through the lumbar spine without intravenous contrast. FINDINGS: There has been L5-S1 laminectomy with resolution of previously seen left-sided synovial cyst. The vertebral bodies are normally aligned. Small hemangioma present in L2. Bone marrow signal intensity is normal.  Multilevel disc dessication is present. The visualized cord is unremarkable and the  conus medullaris ends at a normal level. At L2-3, there is a broad-based disc bulge and right subarticular/foraminal disc protrusion with thickening of the ligamentum flavum results in mild central canal stenosis and neural foraminal narrowing. At L3-4, there is a broad-based disc bulge and thickening of  the ligamentum flavum results in mild central canal stenosis and neural foraminal narrowing. At L4-5, there is foraminal disc bulge and facet arthrosis results in mild bilateral neural foraminal narrowing. At L5-S1, diffuse disc bulge results in mild bilateral neural foraminal narrowing. Previously noted synovial cyst no longer identified. There is increased edema about the L5-S1 and to a lesser extent L4-5 facet joints bilaterally compared to 07/05/2014. For the purposes of this dictation, the lowest well formed intervertebral disc space is assumed to be the L5-S1 level, and there are presumed to be five lumbar-type vertebral bodies. IMPRESSION: Postsurgical changes at L5-S1 with resolution of previous left-sided synovial cyst. Multilevel degenerative changes without with increased edema about the L4-5 and L5-S1 facet joints possibly degenerative and due to irritation.   Adiposity    Problem-specific Plan(s): No problem-specific assessment & plan notes found for this encounter.  No new assessment & plan notes have been filed under this hospital service since the last note was generated. Service: Pain Management   Plan of Care   Problem List Items Addressed This Visit      High   Abnormal MRI, lumbar spine (05/19/2016)   Cervical post-laminectomy syndrome (Chronic)   Chronic cervical radicular pain (Bilateral) (R>L) (Chronic)   Relevant Medications   gabapentin (NEURONTIN) 600 MG tablet   Chronic low back pain (Location of Primary Source of Pain) (Midline pain) (R>L) (Chronic)   Relevant Medications   oxyCODONE (ROXICODONE) 15 MG immediate release tablet   oxyCODONE (ROXICODONE) 15 MG immediate release tablet   oxyCODONE (ROXICODONE) 15 MG immediate release tablet   Chronic lower extremity pain (Location of Secondary source of pain) (Bilateral) (R>L) (Chronic)   Chronic lumbar radicular pain (Bilateral) (R>L) (Chronic)   Chronic neck pain (Right side) (Chronic)   Relevant  Medications   gabapentin (NEURONTIN) 600 MG tablet   oxyCODONE (ROXICODONE) 15 MG immediate release tablet   oxyCODONE (ROXICODONE) 15 MG immediate release tablet   oxyCODONE (ROXICODONE) 15 MG immediate release tablet   Chronic pain - Primary (Chronic)   Relevant Medications   gabapentin (NEURONTIN) 600 MG tablet   oxyCODONE (ROXICODONE) 15 MG immediate release tablet   oxyCODONE (ROXICODONE) 15 MG immediate release tablet   oxyCODONE (ROXICODONE) 15 MG immediate release tablet   Chronic shoulder pain (Location of Tertiary source of pain) (Bilateral) (R>L) (Chronic)   Failed back surgical syndrome (Chronic)   Relevant Medications   oxyCODONE (ROXICODONE) 15 MG immediate release tablet   oxyCODONE (ROXICODONE) 15 MG immediate release tablet   oxyCODONE (ROXICODONE) 15 MG immediate release tablet   Lumbar facet syndrome (Location of Primary Source of Pain) (Bilateral) (R>L) (Chronic)   Relevant Medications   oxyCODONE (ROXICODONE) 15 MG immediate release tablet   oxyCODONE (ROXICODONE) 15 MG immediate release tablet   oxyCODONE (ROXICODONE) 15 MG immediate release tablet   Other Relevant Orders   Radiofrequency,Lumbar   Neurogenic pain (Chronic)   Relevant Medications   gabapentin (NEURONTIN) 600 MG tablet     Medium   Encounter for therapeutic drug level monitoring   Long term current use of opiate analgesic (Chronic)   Relevant Orders   ToxASSURE Select 13 (MW), Urine   Opiate use (112.5 MME/Day) (Chronic)   Relevant Medications  naloxone (NARCAN) 2 MG/2ML injection       Pharmacotherapy (Medications Ordered): Meds ordered this encounter  Medications  . naloxone (NARCAN) 2 MG/2ML injection    Sig: Inject content of syringe into thigh muscle. Call 911.    Dispense:  2 Syringe    Refill:  1    NDC # [id number]. Please teach proper use of device.  . gabapentin (NEURONTIN) 600 MG tablet    Sig: Take 2 tablets (1,200 mg total) by mouth every 6 (six) hours as needed.     Dispense:  240 tablet    Refill:  2    Do not place this medication, or any other prescription from our practice, on "Automatic Refill". Patient may have prescription filled one day early if pharmacy is closed on scheduled refill date.  Marland Kitchen oxyCODONE (ROXICODONE) 15 MG immediate release tablet    Sig: Take 1 tablet (15 mg total) by mouth 5 (five) times daily as needed for pain.    Dispense:  150 tablet    Refill:  0    Do not place this medication, or any other prescription from our practice, on "Automatic Refill". Patient may have prescription filled one day early if pharmacy is closed on scheduled refill date. Do not fill until: 06/01/16 To last until: 07/01/16  . oxyCODONE (ROXICODONE) 15 MG immediate release tablet    Sig: Take 1 tablet (15 mg total) by mouth 5 (five) times daily as needed for pain.    Dispense:  150 tablet    Refill:  0    Do not place this medication, or any other prescription from our practice, on "Automatic Refill". Patient may have prescription filled one day early if pharmacy is closed on scheduled refill date. Do not fill until: 07/01/16 To last until: 07/31/16  . oxyCODONE (ROXICODONE) 15 MG immediate release tablet    Sig: Take 1 tablet (15 mg total) by mouth 5 (five) times daily as needed for pain.    Dispense:  150 tablet    Refill:  0    Do not place this medication, or any other prescription from our practice, on "Automatic Refill". Patient may have prescription filled one day early if pharmacy is closed on scheduled refill date. Do not fill until: 07/31/16 To last until: 08/30/16    [facility] & Procedure Ordered: Orders Placed This Encounter  Procedures  . Radiofrequency,Lumbar  . ToxASSURE Select 13 (MW), Urine    Imaging Ordered: None  Interventional Therapies: Scheduled:  Diagnostic bilateral lumbar facet block #1 under fluoroscopic guidance and IV sedation.    Considering:  Possible lumbar facet radiofrequency ablation.    PRN Procedures:   None at this time.    Referral(s) or Consult(s): None at this time.  New Prescriptions   NALOXONE (NARCAN) 2 MG/2ML INJECTION    Inject content of syringe into thigh muscle. Call 911.    Medications administered during this visit: Mr. Wernicke had no medications administered during this visit.  Requested PM Follow-up: Return in about 2 months (around 08/11/2016) for Medication Management, (3-Mo), Procedure (Scheduled).  Future Appointments Date Time Provider [place]  08/11/2016 9:40 AM Milinda Pointer, MD [facility] None    Primary Care Physician: No primary care provider on file. Location: [facility] Note by: Cammie Faulstich A. Dossie Arbour, M.D, DABA, DABAPM, DABPM, DABIPP, FIPP  Pain Score Disclaimer: We use the NRS-11 scale. This is a self-reported, subjective measurement of pain severity with only modest accuracy. It is used primarily to identify  changes within a particular patient. It must be understood that outpatient pain scales are significantly less accurate that those used for research, where they can be applied under ideal controlled circumstances with minimal exposure to variables. In reality, the score is likely to be a combination of pain intensity and pain affect, where pain affect describes the degree of emotional arousal or changes in action readiness caused by the sensory experience of pain. Factors such as social and work situation, setting, emotional state, anxiety levels, expectation, and prior pain experience may influence pain perception and show large inter-individual differences that may also be affected by time variables.  Patient instructions provided during this appointment: Patient Instructions  You were given 3 prescriptions for Oxycodone today. Facet Blocks Patient Information  Description: The facets are joints in the spine between the vertebrae.  Like any joints in the body, facets can become irritated and painful.  Arthritis  can also effect the facets.  By injecting steroids and local anesthetic in and around these joints, we can temporarily block the nerve supply to them.  Steroids act directly on irritated nerves and tissues to reduce selling and inflammation which often leads to decreased pain.  Facet blocks may be done anywhere along the spine from the neck to the low back depending upon the location of your pain.   After numbing the skin with local anesthetic (like Novocaine), a small needle is passed onto the facet joints under x-ray guidance.  You may experience a sensation of pressure while this is being done.  The entire block usually lasts about 15-25 minutes.   Conditions which may be treated by facet blocks:   Low back/buttock pain  Neck/shoulder pain  Certain types of headaches  Preparation for the injection:  1. Do not eat any solid food or dairy products within 8 hours of your appointment. 2. You may drink clear liquid up to 3 hours before appointment.  Clear liquids include water, black coffee, juice or soda.  No milk or cream please. 3. You may take your regular medication, including pain medications, with a sip of water before your appointment.  Diabetics should hold regular insulin (if taken separately) and take 1/2 normal NPH dose the morning of the procedure.  Carry some sugar containing items with you to your appointment. 4. A driver must accompany you and be prepared to drive you home after your procedure. 5. Bring all your current medications with you. 6. An IV may be inserted and sedation may be given at the discretion of the physician. 7. A blood pressure cuff, EKG and other monitors will often be applied during the procedure.  Some patients may need to have extra oxygen administered for a short period. 8. You will be asked to provide medical information, including your allergies and medications, prior to the procedure.  We must know immediately if you are taking blood thinners (like  Coumadin/Warfarin) or if you are allergic to IV iodine contrast (dye).  We must know if you could possible be pregnant.  Possible side-effects:   Bleeding from needle site  Infection (rare, may require surgery)  Nerve injury (rare)  Numbness & tingling (temporary)  Difficulty urinating (rare, temporary)  Spinal headache (a headache worse with upright posture)  Light-headedness (temporary)  Pain at injection site (serveral days)  Decreased blood pressure (rare, temporary)  Weakness in arm/leg (temporary)  Pressure sensation in back/neck (temporary)   Call if you experience:   Fever/chills associated with headache or increased back/neck pain  Headache  worsened by an upright position  New onset, weakness or numbness of an extremity below the injection site  Hives or difficulty breathing (go to the emergency room)  Inflammation or drainage at the injection site(s)  Severe back/neck pain greater than usual  New symptoms which are concerning to you  Please note:  Although the local anesthetic injected can often make your back or neck feel good for several hours after the injection, the pain will likely return. It takes 3-7 days for steroids to work.  You may not notice any pain relief for at least one week.  If effective, we will often do a series of 2-3 injections spaced 3-6 weeks apart to maximally decrease your pain.  After the initial series, you may be a candidate for a more permanent nerve block of the facets.  If you have any questions, please call #[phone]) [facility]

## 2016-05-28 NOTE — Progress Notes (Signed)
Safety precautions to be maintained throughout the outpatient stay will include: orient to surroundings, keep bed in low position, maintain call bell within reach at all times, provide assistance with transfer out of bed and ambulation.   Did not get Narcan filled, too expensive.  #12 out of 120 Oxycodone 15 mg remaining. Filled 05-06-16.

## 2016-05-28 NOTE — Patient Instructions (Signed)
You were given 3 prescriptions for Oxycodone today. Facet Blocks Patient Information  Description: The facets are joints in the spine between the vertebrae.  Like any joints in the body, facets can become irritated and painful.  Arthritis can also effect the facets.  By injecting steroids and local anesthetic in and around these joints, we can temporarily block the nerve supply to them.  Steroids act directly on irritated nerves and tissues to reduce selling and inflammation which often leads to decreased pain.  Facet blocks may be done anywhere along the spine from the neck to the low back depending upon the location of your pain.   After numbing the skin with local anesthetic (like Novocaine), a small needle is passed onto the facet joints under x-ray guidance.  You may experience a sensation of pressure while this is being done.  The entire block usually lasts about 15-25 minutes.   Conditions which may be treated by facet blocks:   Low back/buttock pain  Neck/shoulder pain  Certain types of headaches  Preparation for the injection:  1. Do not eat any solid food or dairy products within 8 hours of your appointment. 2. You may drink clear liquid up to 3 hours before appointment.  Clear liquids include water, black coffee, juice or soda.  No milk or cream please. 3. You may take your regular medication, including pain medications, with a sip of water before your appointment.  Diabetics should hold regular insulin (if taken separately) and take 1/2 normal NPH dose the morning of the procedure.  Carry some sugar containing items with you to your appointment. 4. A driver must accompany you and be prepared to drive you home after your procedure. 5. Bring all your current medications with you. 6. An IV may be inserted and sedation may be given at the discretion of the physician. 7. A blood pressure cuff, EKG and other monitors will often be applied during the procedure.  Some patients may need to  have extra oxygen administered for a short period. 8. You will be asked to provide medical information, including your allergies and medications, prior to the procedure.  We must know immediately if you are taking blood thinners (like Coumadin/Warfarin) or if you are allergic to IV iodine contrast (dye).  We must know if you could possible be pregnant.  Possible side-effects:   Bleeding from needle site  Infection (rare, may require surgery)  Nerve injury (rare)  Numbness & tingling (temporary)  Difficulty urinating (rare, temporary)  Spinal headache (a headache worse with upright posture)  Light-headedness (temporary)  Pain at injection site (serveral days)  Decreased blood pressure (rare, temporary)  Weakness in arm/leg (temporary)  Pressure sensation in back/neck (temporary)   Call if you experience:   Fever/chills associated with headache or increased back/neck pain  Headache worsened by an upright position  New onset, weakness or numbness of an extremity below the injection site  Hives or difficulty breathing (go to the emergency room)  Inflammation or drainage at the injection site(s)  Severe back/neck pain greater than usual  New symptoms which are concerning to you  Please note:  Although the local anesthetic injected can often make your back or neck feel good for several hours after the injection, the pain will likely return. It takes 3-7 days for steroids to work.  You may not notice any pain relief for at least one week.  If effective, we will often do a series of 2-3 injections spaced 3-6 weeks apart to  maximally decrease your pain.  After the initial series, you may be a candidate for a more permanent nerve block of the facets.  If you have any questions, please call #[phone]) [facility]

## 2016-06-03 ENCOUNTER — Encounter: Payer: Self-pay | Admitting: Pain Medicine

## 2016-06-03 ENCOUNTER — Ambulatory Visit: Payer: 59 | Attending: Pain Medicine | Admitting: Pain Medicine

## 2016-06-03 VITALS — BP 164/100 | HR 80 | Temp 97.4°F | Resp 22 | Ht 74.0 in | Wt 227.0 lb

## 2016-06-03 DIAGNOSIS — E785 Hyperlipidemia, unspecified: Secondary | ICD-10-CM | POA: Diagnosis not present

## 2016-06-03 DIAGNOSIS — E669 Obesity, unspecified: Secondary | ICD-10-CM | POA: Insufficient documentation

## 2016-06-03 DIAGNOSIS — M25511 Pain in right shoulder: Secondary | ICD-10-CM | POA: Insufficient documentation

## 2016-06-03 DIAGNOSIS — G9529 Other cord compression: Secondary | ICD-10-CM | POA: Insufficient documentation

## 2016-06-03 DIAGNOSIS — I340 Nonrheumatic mitral (valve) insufficiency: Secondary | ICD-10-CM | POA: Insufficient documentation

## 2016-06-03 DIAGNOSIS — M47816 Spondylosis without myelopathy or radiculopathy, lumbar region: Secondary | ICD-10-CM

## 2016-06-03 DIAGNOSIS — R011 Cardiac murmur, unspecified: Secondary | ICD-10-CM | POA: Diagnosis not present

## 2016-06-03 DIAGNOSIS — M549 Dorsalgia, unspecified: Secondary | ICD-10-CM | POA: Diagnosis present

## 2016-06-03 DIAGNOSIS — F1190 Opioid use, unspecified, uncomplicated: Secondary | ICD-10-CM | POA: Insufficient documentation

## 2016-06-03 DIAGNOSIS — L98499 Non-pressure chronic ulcer of skin of other sites with unspecified severity: Secondary | ICD-10-CM | POA: Insufficient documentation

## 2016-06-03 DIAGNOSIS — Z79891 Long term (current) use of opiate analgesic: Secondary | ICD-10-CM | POA: Diagnosis not present

## 2016-06-03 DIAGNOSIS — R7982 Elevated C-reactive protein (CRP): Secondary | ICD-10-CM | POA: Insufficient documentation

## 2016-06-03 DIAGNOSIS — F17210 Nicotine dependence, cigarettes, uncomplicated: Secondary | ICD-10-CM | POA: Diagnosis not present

## 2016-06-03 DIAGNOSIS — G8929 Other chronic pain: Secondary | ICD-10-CM

## 2016-06-03 DIAGNOSIS — M25512 Pain in left shoulder: Secondary | ICD-10-CM | POA: Diagnosis not present

## 2016-06-03 DIAGNOSIS — M868X9 Other osteomyelitis, unspecified sites: Secondary | ICD-10-CM | POA: Diagnosis not present

## 2016-06-03 DIAGNOSIS — N529 Male erectile dysfunction, unspecified: Secondary | ICD-10-CM | POA: Insufficient documentation

## 2016-06-03 DIAGNOSIS — I10 Essential (primary) hypertension: Secondary | ICD-10-CM | POA: Insufficient documentation

## 2016-06-03 DIAGNOSIS — E1140 Type 2 diabetes mellitus with diabetic neuropathy, unspecified: Secondary | ICD-10-CM | POA: Insufficient documentation

## 2016-06-03 DIAGNOSIS — M545 Low back pain: Secondary | ICD-10-CM

## 2016-06-03 DIAGNOSIS — B1920 Unspecified viral hepatitis C without hepatic coma: Secondary | ICD-10-CM | POA: Insufficient documentation

## 2016-06-03 LAB — TOXASSURE SELECT 13 (MW), URINE: PDF: 0

## 2016-06-03 MED ORDER — MIDAZOLAM HCL 5 MG/5ML IJ SOLN
INTRAMUSCULAR | Status: AC
  Administered 2016-06-03: 2 mg
  Filled 2016-06-03: qty 5

## 2016-06-03 MED ORDER — FENTANYL CITRATE (PF) 100 MCG/2ML IJ SOLN
INTRAMUSCULAR | Status: AC
  Administered 2016-06-03: 50 ug
  Filled 2016-06-03: qty 2

## 2016-06-03 MED ORDER — MIDAZOLAM HCL 5 MG/5ML IJ SOLN: 1.0000 mg | INTRAMUSCULAR | Status: DC | PRN

## 2016-06-03 MED ORDER — LIDOCAINE HCL (PF) 1 % IJ SOLN: 10.0000 mL | Freq: Once | INTRAMUSCULAR | Status: DC

## 2016-06-03 MED ORDER — LACTATED RINGERS IV SOLN: 1000.0000 mL | Freq: Once | INTRAVENOUS | Status: DC

## 2016-06-03 MED ORDER — ROPIVACAINE HCL 2 MG/ML IJ SOLN
INTRAMUSCULAR | Status: AC
  Administered 2016-06-03: 14:00:00
  Filled 2016-06-03: qty 20

## 2016-06-03 MED ORDER — LIDOCAINE HCL (PF) 1 % IJ SOLN
INTRAMUSCULAR | Status: AC
  Administered 2016-06-03: 14:00:00
  Filled 2016-06-03: qty 5

## 2016-06-03 MED ORDER — ROPIVACAINE HCL 2 MG/ML IJ SOLN: 9.0000 mL | Freq: Once | INTRAMUSCULAR | Status: DC

## 2016-06-03 MED ORDER — FENTANYL CITRATE (PF) 100 MCG/2ML IJ SOLN: 25.0000 ug | INTRAMUSCULAR | Status: DC | PRN

## 2016-06-03 MED ORDER — TRIAMCINOLONE ACETONIDE 40 MG/ML IJ SUSP
INTRAMUSCULAR | Status: AC
  Administered 2016-06-03: 14:00:00
  Filled 2016-06-03: qty 2

## 2016-06-03 MED ORDER — TRIAMCINOLONE ACETONIDE 40 MG/ML IJ SUSP: 40.0000 mg | Freq: Once | INTRAMUSCULAR | Status: DC

## 2016-06-03 NOTE — Patient Instructions (Addendum)
Pain Management Discharge Instructions  General Discharge Instructions :  If you need to reach your doctor call: Monday-Friday 8:00 am - 4:00 pm at [phone] or toll free [phone].  After clinic hours [phone] to have operator reach doctor.  Bring all of your medication bottles to all your appointments in the pain clinic.  To cancel or reschedule your appointment with Pain Management please remember to call 24 hours in advance to avoid a fee.  Refer to the educational materials which you have been given on: General Risks, I had my Procedure. Discharge Instructions, Post Sedation.  Post Procedure Instructions:  The drugs you were given will stay in your system until tomorrow, so for the next 24 hours you should not drive, make any legal decisions or drink any alcoholic beverages.  You may eat anything you prefer, but it is better to start with liquids then soups and crackers, and gradually work up to solid foods.  Please notify your doctor immediately if you have any unusual bleeding, trouble breathing or pain that is not related to your normal pain.  Depending on the type of procedure that was done, some parts of your body may feel week and/or numb.  This usually clears up by tonight or the next day.  Walk with the use of an assistive device or accompanied by an adult for the 24 hours.  You may use ice on the affected area for the first 24 hours.  Put ice in a Ziploc bag and cover with a towel and place against area 15 minutes on 15 minutes off.  You may switch to heat after 24 hours.GENERAL RISKS AND COMPLICATIONS  What are the risk, side effects and possible complications? Generally speaking, most procedures are safe.  However, with any procedure there are risks, side effects, and the possibility of complications.  The risks and complications are dependent upon the sites that are lesioned, or the type of nerve block to be performed.  The closer the procedure is to the spine,  the more serious the risks are.  Great care is taken when placing the radio frequency needles, block needles or lesioning probes, but sometimes complications can occur. 1. Infection: Any time there is an injection through the skin, there is a risk of infection.  This is why sterile conditions are used for these blocks.  There are four possible types of infection. 1. Localized skin infection. 2. Central Nervous System Infection-This can be in the form of Meningitis, which can be deadly. 3. Epidural Infections-This can be in the form of an epidural abscess, which can cause pressure inside of the spine, causing compression of the spinal cord with subsequent paralysis. This would require an emergency surgery to decompress, and there are no guarantees that the patient would recover from the paralysis. 4. Discitis-This is an infection of the intervertebral discs.  It occurs in about 1% of discography procedures.  It is difficult to treat and it may lead to surgery.        2. Pain: the needles have to go through skin and soft tissues, will cause soreness.       3. Damage to internal structures:  The nerves to be lesioned may be near blood vessels or    other nerves which can be potentially damaged.       4. Bleeding: Bleeding is more common if the patient is taking blood thinners such as  aspirin, Coumadin, Ticiid, Plavix, etc., or if he/she have some genetic predisposition  such as   hemophilia. Bleeding into the spinal canal can cause compression of the spinal  cord with subsequent paralysis.  This would require an emergency surgery to  decompress and there are no guarantees that the patient would recover from the  paralysis.       5. Pneumothorax:  Puncturing of a lung is a possibility, every time a needle is introduced in  the area of the chest or upper back.  Pneumothorax refers to free air around the  collapsed lung(s), inside of the thoracic cavity (chest cavity).  Another two possible  complications  related to a similar event would include: Hemothorax and Chylothorax.   These are variations of the Pneumothorax, where instead of air around the collapsed  lung(s), you may have blood or chyle, respectively.       6. Spinal headaches: They may occur with any procedures in the area of the spine.       7. Persistent CSF (Cerebro-Spinal Fluid) leakage: This is a rare problem, but may occur  with prolonged intrathecal or epidural catheters either due to the formation of a fistulous  track or a dural tear.       8. Nerve damage: By working so close to the spinal cord, there is always a possibility of  nerve damage, which could be as serious as a permanent spinal cord injury with  paralysis.       9. Death:  Although rare, severe deadly allergic reactions known as "Anaphylactic  reaction" can occur to any of the medications used.      10. Worsening of the symptoms:  We can always make thing worse.  What are the chances of something like this happening? Chances of any of this occuring are extremely low.  By statistics, you have more of a chance of getting killed in a motor vehicle accident: while driving to the hospital than any of the above occurring .  Nevertheless, you should be aware that they are possibilities.  In general, it is similar to taking a shower.  Everybody knows that you can slip, hit your head and get killed.  Does that mean that you should not shower again?  Nevertheless always keep in mind that statistics do not mean anything if you happen to be on the wrong side of them.  Even if a procedure has a 1 (one) in a 1,000,000 (million) chance of going wrong, it you happen to be that one..Also, keep in mind that by statistics, you have more of a chance of having something go wrong when taking medications.  Who should not have this procedure? If you are on a blood thinning medication (e.g. Coumadin, Plavix, see list of "Blood Thinners"), or if you have an active infection going on, you should not  have the procedure.  If you are taking any blood thinners, please inform your physician.  How should I prepare for this procedure?  Do not eat or drink anything at least six hours prior to the procedure.  Bring a driver with you .  It cannot be a taxi.  Come accompanied by an adult that can drive you back, and that is strong enough to help you if your legs get weak or numb from the local anesthetic.  Take all of your medicines the morning of the procedure with just enough water to swallow them.  If you have diabetes, make sure that you are scheduled to have your procedure done first thing in the morning, whenever possible.  If you have diabetes,   take only half of your insulin dose and notify our nurse that you have done so as soon as you arrive at the clinic.  If you are diabetic, but only take blood sugar pills (oral hypoglycemic), then do not take them on the morning of your procedure.  You may take them after you have had the procedure.  Do not take aspirin or any aspirin-containing medications, at least eleven (11) days prior to the procedure.  They may prolong bleeding.  Wear loose fitting clothing that may be easy to take off and that you would not mind if it got stained with Betadine or blood.  Do not wear any jewelry or perfume  Remove any nail coloring.  It will interfere with some of our monitoring equipment.  NOTE: Remember that this is not meant to be interpreted as a complete list of all possible complications.  Unforeseen problems may occur.  BLOOD THINNERS The following drugs contain aspirin or other products, which can cause increased bleeding during surgery and should not be taken for 2 weeks prior to and 1 week after surgery.  If you should need take something for relief of minor pain, you may take acetaminophen which is found in Tylenol,m Datril, Anacin-3 and Panadol. It is not blood thinner. The products listed below are.  Do not take any of the products listed below  in addition to any listed on your instruction sheet.  A.P.C or A.P.C with Codeine Codeine Phosphate Capsules #3 Ibuprofen Ridaura  ABC compound Congesprin Imuran rimadil  Advil Cope Indocin Robaxisal  Alka-Seltzer Effervescent Pain Reliever and Antacid Coricidin or Coricidin-D  Indomethacin Rufen  Alka-Seltzer plus Cold Medicine Cosprin Ketoprofen S-A-C Tablets  Anacin Analgesic Tablets or Capsules Coumadin Korlgesic Salflex  Anacin Extra Strength Analgesic tablets or capsules CP-2 Tablets Lanoril Salicylate  Anaprox Cuprimine Capsules Levenox Salocol  Anexsia-D Dalteparin Magan Salsalate  Anodynos Darvon compound Magnesium Salicylate Sine-off  Ansaid Dasin Capsules Magsal Sodium Salicylate  Anturane Depen Capsules Marnal Soma  APF Arthritis pain formula Dewitt's Pills Measurin Stanback  Argesic Dia-Gesic Meclofenamic Sulfinpyrazone  Arthritis Bayer Timed Release Aspirin Diclofenac Meclomen Sulindac  Arthritis pain formula Anacin Dicumarol Medipren Supac  Analgesic (Safety coated) Arthralgen Diffunasal Mefanamic Suprofen  Arthritis Strength Bufferin Dihydrocodeine Mepro Compound Suprol  Arthropan liquid Dopirydamole Methcarbomol with Aspirin Synalgos  ASA tablets/Enseals Disalcid Micrainin Tagament  Ascriptin Doan's Midol Talwin  Ascriptin A/D Dolene Mobidin Tanderil  Ascriptin Extra Strength Dolobid Moblgesic Ticlid  Ascriptin with Codeine Doloprin or Doloprin with Codeine Momentum Tolectin  Asperbuf Duoprin Mono-gesic Trendar  Aspergum Duradyne Motrin or Motrin IB Triminicin  Aspirin plain, buffered or enteric coated Durasal Myochrisine Trigesic  Aspirin Suppositories Easprin Nalfon Trillsate  Aspirin with Codeine Ecotrin Regular or Extra Strength Naprosyn Uracel  Atromid-S Efficin Naproxen Ursinus  Auranofin Capsules Elmiron Neocylate Vanquish  Axotal Emagrin Norgesic Verin  Azathioprine Empirin or Empirin with Codeine Normiflo Vitamin E  Azolid Emprazil Nuprin Voltaren  Bayer  Aspirin plain, buffered or children's or timed BC Tablets or powders Encaprin Orgaran Warfarin Sodium  Buff-a-Comp Enoxaparin Orudis Zorpin  Buff-a-Comp with Codeine Equegesic Os-Cal-Gesic   Buffaprin Excedrin plain, buffered or Extra Strength Oxalid   Bufferin Arthritis Strength Feldene Oxphenbutazone   Bufferin plain or Extra Strength Feldene Capsules Oxycodone with Aspirin   Bufferin with Codeine Fenoprofen Fenoprofen Pabalate or Pabalate-SF   Buffets II Flogesic Panagesic   Buffinol plain or Extra Strength Florinal or Florinal with Codeine Panwarfarin   Buf-Tabs Flurbiprofen Penicillamine   Butalbital Compound Four-way cold tablets   Penicillin   Butazolidin Fragmin Pepto-Bismol   Carbenicillin Geminisyn Percodan   Carna Arthritis Reliever Geopen Persantine   Carprofen Gold's salt Persistin   Chloramphenicol Goody's Phenylbutazone   Chloromycetin Haltrain Piroxlcam   Clmetidine heparin Plaquenil   Cllnoril Hyco-pap Ponstel   Clofibrate Hydroxy chloroquine Propoxyphen         Before stopping any of these medications, be sure to consult the physician who ordered them.  Some, such as Coumadin (Warfarin) are ordered to prevent or treat serious conditions such as "deep thrombosis", "pumonary embolisms", and other heart problems.  The amount of time that you may need off of the medication may also vary with the medication and the reason for which you were taking it.  If you are taking any of these medications, please make sure you notify your pain physician before you undergo any procedures.  Post procedure pain diary given.

## 2016-06-03 NOTE — Progress Notes (Signed)
Patient's Name: Willie Krueger  Patient type: Established  MRN: [id number]  Service setting: Ambulatory outpatient  DOB: 04/05/1956  Location: [facility]  DOS: 06/03/2016  Primary Care Physician: No primary care provider on file.  Note by: Isabelle Matt A. Dossie Arbour, M.D, DABA, DABAPM, DABPM, DABIPP, FIPP  Referring Physician: No ref. provider found  Specialty: Board-Certified Interventional Pain Management  Last Visit to Pain Management: 05/28/2016   Primary Reason(s) for Visit: Interventional Pain Management Treatment. CC: Back Pain; Arm Pain; and Shoulder Pain  Primary Diagnosis: Facet syndrome, lumbar [M54.5]   Procedure:  Anesthesia, Analgesia, Anxiolysis:  Type: Diagnostic Medial Branch Facet Block Region: Lumbar Level: L2, L3, L4, L5, & S1 Medial Branch Level(s) Laterality: Bilateral  Indications: 1. Lumbar facet syndrome (Location of Primary Source of Pain) (Bilateral) (R>L)   2. Chronic low back pain (Location of Primary Source of Pain) (Midline pain) (R>L)   3. Lumbar spondylosis, unspecified spinal osteoarthritis     Pre-procedure Pain Score: 5/10 Reported level of pain is compatible with clinical observations Post-procedure Pain Score: 2   Type: Moderate (Conscious) Sedation & Local Anesthesia Local Anesthetic: Lidocaine 1% Route: Intravenous (IV) IV Access: Secured Sedation: Meaningful verbal contact was maintained at all times during the procedure  Indication(s): Analgesia & Anxiolysis   Pre-Procedure Assessment:  Willie Krueger is a 60 y.o. year old, male patient, seen today for interventional treatment. He has Toe ulcer ([place]); Cervical spinal cord compression ([employer]); Cardiac murmur; Chronic hepatitis C virus infection ([place]); HLD (hyperlipidemia); Benign hypertension; ED (erectile dysfunction) of organic origin; MI (mitral incompetence); Adiposity; Current smoker; History of decompression of median nerve; Chronic pain; Long term current use of opiate  analgesic; Long term prescription opiate use; Opiate use (112.5 MME/Day); Opiate dependence ([place]); Encounter for therapeutic drug level monitoring; Chronic neck pain (Right side); Cervical spondylosis; Cervical post-laminectomy syndrome; Chronic low back pain (Location of Primary Source of Pain) (Midline pain) (R>L); Chronic cervical radicular pain (Bilateral) (R>L); Chronic lumbar radicular pain (Bilateral) (R>L); Substance use disorder Risk: LOW; Failed back surgical syndrome; Chronic lower extremity pain (Location of Secondary source of pain) (Bilateral) (R>L); History of carpal tunnel release of both wrists; Grade I anterolisthesis of C7 on T1; Encounter for chronic pain management; Elevated C-reactive protein (CRP); Diabetic osteomyelitis ([place]); Neuropathy ([place]); Obesity; Neurogenic pain; 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 His primarily concern today is the Back Pain; Arm Pain; and Shoulder Pain   Pain Type: Chronic pain Pain Location: Back Pain Orientation: Lower Pain Descriptors / Indicators: Aching, Burning Pain Frequency: Constant  Date of Last Visit: 05/28/16 Service Provided on Last Visit: Med Refill  Coagulation Parameters Lab Results  Component Value Date   PLT 177 03/04/2016    Verification of the correct person, correct site (including marking of site), and correct procedure were performed and confirmed by the patient.  Consent: Secured. Under the influence of no sedatives a written informed consent was obtained, after having provided information on the risks and possible complications. To fulfill our ethical and legal obligations, as recommended by the American Medical Association's Code of  Ethics, we have provided information to the patient about our clinical impression; the nature and purpose of the treatment or procedure; the risks, benefits, and possible complications of the intervention; alternatives; the  risk(s) and benefit(s) of the alternative treatment(s) or procedure(s); and the risk(s) and benefit(s) of doing nothing. The patient was provided information about the risks and possible complications associated with the procedure. These include, but are not limited to, failure to achieve desired goals, infection, bleeding, organ or nerve damage, allergic reactions, paralysis, and death. In the case of spinal procedures these may include, but are not limited to, failure to achieve desired goals, infection, bleeding, organ or nerve damage, allergic reactions, paralysis, and death. In addition, the patient was informed that Medicine is not an exact science; therefore, there is also the possibility of unforeseen risks and possible complications that may result in a catastrophic outcome. The patient indicated having understood very clearly. We have given the patient no guarantees and we have made no promises. Enough time was given to the patient to ask questions, all of which were answered to the patient's satisfaction.  Consent Attestation: I, the ordering provider, attest that I have discussed with the patient the benefits, risks, side-effects, alternatives, likelihood of achieving goals, and potential problems during recovery for the procedure that I have provided informed consent.  Pre-Procedure Preparation: Safety Precautions: Allergies reviewed. Appropriate site, procedure, and patient were confirmed by following the Joint Commission's Universal Protocol (UP.01.01.01), in the form of a "Time Out". The patient was asked to confirm marked site and procedure, before commencing. The patient was asked about blood thinners, or active infections, both of which were denied. Patient was assessed  for positional comfort and all pressure points were checked before starting procedure. Allergies: He is allergic to cleocin and sulfa antibiotics.. Infection Control Precautions: Sterile technique used. Standard Universal Precautions were taken as recommended by the Department of [facility] for Disease Control and Prevention (CDC). Standard pre-surgical skin prep was conducted. Respiratory hygiene and cough etiquette was practiced. Hand hygiene observed. Safe injection practices and needle disposal techniques followed. SDV (single dose vial) medications used. Medications properly checked for expiration dates and contaminants. Personal protective equipment (PPE) used: Sterile Radiation-resistant gloves. Monitoring:  As per clinic protocol. Filed Vitals:   06/03/16 1432 06/03/16 1442 06/03/16 1453 06/03/16 1503  BP: 124/84 146/96 141/92 164/100  Pulse: 72 69 68 80  Temp: 97.3 F (36.3 C)   97.4 F (36.3 C)  TempSrc:      Resp: 14 22 20 22   Height:      Weight:      SpO2: 100% 95% 95% 97%  Calculated BMI: Body mass index is 29.13 kg/(m^2).  Description of Procedure Process:   Time-out: "Time-out" completed before starting procedure, as per protocol. Position: Prone Target Area: For Lumbar Facet blocks, the target is the groove formed by the junction of the transverse process and superior articular process. For the L5 dorsal ramus, the target is the notch between superior articular process and sacral ala. For the S1 dorsal ramus, the target is the superior and lateral edge of the posterior S1 Sacral foramen. Approach: Paramedial approach. Area Prepped: Entire Posterior Lumbosacral Region Prepping solution: ChloraPrep (2% chlorhexidine gluconate and 70% isopropyl alcohol) Safety Precautions: Aspiration looking for blood return was conducted prior to all injections. At no point did we inject any substances, as a needle was being advanced. No attempts were made at seeking any  paresthesias. Safe injection practices and needle disposal techniques used. Medications properly checked for expiration dates. SDV (single dose vial) medications used.   Description of the Procedure: Protocol guidelines were followed. The patient was placed in position over the fluoroscopy table.  The target area was identified and the area prepped in the usual manner. Skin desensitized using vapocoolant spray. Skin & deeper tissues infiltrated with local anesthetic. Appropriate amount of time allowed to pass for local anesthetics to take effect. The procedure needle was introduced through the skin, ipsilateral to the reported pain, and advanced to the target area. Employing the "Medial Branch Technique", the needles were advanced to the angle made by the superior and medial portion of the transverse process, and the lateral and inferior portion of the superior articulating process of the targeted vertebral bodies. This area is known as "Burton's Eye" or the "Eye of the [place] Dog". A procedure needle was introduced through the skin, and this time advanced to the angle made by the superior and medial border of the sacral ala, and the lateral border of the S1 vertebral body. This last needle was later repositioned at the superior and lateral border of the posterior S1 foramen. Negative aspiration confirmed. Solution injected in intermittent fashion, asking for systemic symptoms every 0.5cc of injectate. The needles were then removed and the area cleansed, making sure to leave some of the prepping solution back to take advantage of its long term bactericidal properties. EBL: None Materials & Medications Used:  Needle(s) Used: 22g - 5" Spinal Needle(s)  Imaging Guidance:   Type of Imaging Technique: Fluoroscopy Guidance (Spinal) Indication(s): Assistance in needle guidance and placement for procedures requiring needle placement in or near specific anatomical locations not easily accessible without such  assistance. Exposure Time: Please see nurses notes. Contrast: None required. Fluoroscopic Guidance: I was personally present in the fluoroscopy suite, where the patient was placed in position for the procedure, over the fluoroscopy-compatible table. Fluoroscopy was manipulated, using "Tunnel Vision Technique", to obtain the best possible view of the target area, on the affected side. Parallax error was corrected before commencing the procedure. A "direction-depth-direction" technique was used to introduce the needle under continuous pulsed fluoroscopic guidance. Once the target was reached, antero-posterior, oblique, and lateral fluoroscopic projection views were taken to confirm needle placement in all planes. Permanently recorded images stored by scanning into EMR. Interpretation: Intraoperative imaging interpretation by performing Physician. Adequate needle placement confirmed. Adequate needle placement confirmed in AP, lateral, & Oblique Views. No contrast injected.  Antibiotic Prophylaxis:  Indication(s): No indications identified. Type:  Antibiotics Given (last 72 hours)    None       Post-operative Assessment:   Complications: No immediate post-treatment complications were observed. Disposition: Return to clinic for follow-up evaluation. The patient tolerated the entire procedure well. A repeat set of vitals were taken after the procedure and the patient was kept under observation following institutional policy, for this procedure. Post-procedural neurological assessment was performed, showing return to baseline, prior to discharge. The patient was discharged home, once institutional criteria were met. The patient was provided with post-procedure discharge instructions, including a section on how to identify potential problems. Should any problems arise concerning this procedure, the patient was given instructions to immediately contact us, at any time, without hesitation. In any case, we plan  to contact the patient by telephone for a follow-up status report regarding this interventional procedure. Comments:  No additional relevant information.  Plan of Care   Problem List Items Addressed This Visit      High   Chronic low back pain (Location of Primary Source of Pain) (Midline pain) (R>L) (Chronic)   Relevant Medications   fentaNYL (SUBLIMAZE) 100 MCG/2ML injection   triamcinolone acetonide (KENALOG-40) 40 MG/ML injection (  Completed)   fentaNYL (SUBLIMAZE) injection 25-50 mcg   triamcinolone acetonide (KENALOG-40) injection 40 mg   Lumbar facet syndrome (Location of Primary Source of Pain) (Bilateral) (R>L) - Primary (Chronic)   Relevant Medications   fentaNYL (SUBLIMAZE) 100 MCG/2ML injection   triamcinolone acetonide (KENALOG-40) 40 MG/ML injection (Completed)   fentaNYL (SUBLIMAZE) injection 25-50 mcg   lactated ringers infusion 1,000 mL   midazolam (VERSED) 5 MG/5ML injection 1-2 mg   triamcinolone acetonide (KENALOG-40) injection 40 mg   lidocaine (PF) (XYLOCAINE) 1 % injection 10 mL   ropivacaine (PF) 2 mg/ml (0.2%) (NAROPIN) epidural 9 mL   Other Relevant Orders   LUMBAR FACET(MEDIAL BRANCH NERVE BLOCK) MBNB   Lumbar spondylosis (Chronic)   Relevant Medications   fentaNYL (SUBLIMAZE) 100 MCG/2ML injection   triamcinolone acetonide (KENALOG-40) 40 MG/ML injection (Completed)   fentaNYL (SUBLIMAZE) injection 25-50 mcg   triamcinolone acetonide (KENALOG-40) injection 40 mg       Pharmacotherapy (Medications Ordered): Meds ordered this encounter  Medications  . ropivacaine (PF) 2 mg/ml (0.2%) (NAROPIN) 2 MG/ML epidural    Sig:     GARNER, CYNTHIA: cabinet override  . lidocaine (PF) (XYLOCAINE) 1 % injection    Sig:     GARNER, CYNTHIA: cabinet override  . fentaNYL (SUBLIMAZE) 100 MCG/2ML injection    Sig:     GARNER, CYNTHIA: cabinet override  . triamcinolone acetonide (KENALOG-40) 40 MG/ML injection    Sig:     GARNER, CYNTHIA: cabinet override  .  midazolam (VERSED) 5 MG/5ML injection    Sig:     GARNER, CYNTHIA: cabinet override  . fentaNYL (SUBLIMAZE) injection 25-50 mcg    Sig:     Make sure Narcan is available in the pyxis when using this medication. In the event of respiratory depression (RR< 8/min): Titrate NARCAN (naloxone) in increments of 0.1 to 0.2 mg IV at 2-3 minute intervals, until desired degree of reversal.  . lactated ringers infusion 1,000 mL    Sig:   . midazolam (VERSED) 5 MG/5ML injection 1-2 mg    Sig:     Make sure Flumazenil is available in the pyxis when using this medication. If oversedation occurs, administer 0.2 mg IV over 15 sec. If after 45 sec no response, administer 0.2 mg again over 1 min; may repeat at 1 min intervals; not to exceed 4 doses (1 mg)  . triamcinolone acetonide (KENALOG-40) injection 40 mg    Sig:   . lidocaine (PF) (XYLOCAINE) 1 % injection 10 mL    Sig:   . ropivacaine (PF) 2 mg/ml (0.2%) (NAROPIN) epidural 9 mL    Sig:     Lab-work & Procedure Ordered: Orders Placed This Encounter  Procedures  . LUMBAR FACET(MEDIAL BRANCH NERVE BLOCK) MBNB    New medications started at this visit: New Prescriptions   No medications on file    Medications administered during this visit: We administered ropivacaine (PF) 2 mg/ml (0.2%), lidocaine (PF), and triamcinolone acetonide.  Requested PM Follow-up: Return for Post-Procedure Eval (2 weeks).  Future Appointments Date Time Provider [place]  06/18/2016 10:20 AM Milinda Pointer, MD [facility] None  08/11/2016 9:40 AM Milinda Pointer, MD [facility] None    Primary Care Physician: No primary care provider on file. Location: [facility] Note by: Tigerlily Christine A. Dossie Arbour, M.D, DABA, DABAPM, DABPM, DABIPP, FIPP   Illustration of the posterior view of the lumbar spine and the posterior neural structures. Laminae of L2 through S1 are labeled. DPRL5, dorsal primary ramus  of L5; DPRS1, dorsal primary ramus  of S1; DPR3, dorsal primary ramus of L3; FJ, facet (zygapophyseal) joint L3-L4; I, inferior articular process of L4; LB1, lateral branch of dorsal primary ramus of L1; IAB, inferior articular branches from L3 medial branch (supplies L4-L5 facet joint); IBP, intermediate branch plexus; MB3, medial branch of dorsal primary ramus of L3; NR3, third lumbar nerve root; S, superior articular process of L5; SAB, superior articular branches from L4 (supplies L4-5 facet joint also); TP3, transverse process of L3.  Disclaimer:  Medicine is not an Chief Strategy Officer. The only guarantee in medicine is that nothing is guaranteed. It is important to note that the decision to proceed with this intervention was based on the information collected from the patient. The Data and conclusions were drawn from the patient's questionnaire, the interview, and the physical examination. Because the information was provided in large part by the patient, it cannot be guaranteed that it has not been purposely or unconsciously manipulated. Every effort has been made to obtain as much relevant data as possible for this evaluation. It is important to note that the conclusions that lead to this procedure are derived in large part from the available data. Always take into account that the treatment will also be dependent on availability of resources and existing treatment guidelines, considered by other Pain Management Practitioners as being common knowledge and practice, at the time of the intervention. For Medico-Legal purposes, it is also important to point out that variation in procedural techniques and pharmacological choices are the acceptable norm. The indications, contraindications, technique, and results of the above procedure should only be interpreted and judged by a Board-Certified Interventional Pain Specialist with extensive familiarity and expertise in the same exact procedure and technique. Attempts at providing opinions without similar  or greater experience and expertise than that of the treating physician will be considered as inappropriate and unethical, and shall result in a formal complaint to the state medical board and applicable specialty societies.

## 2016-06-04 ENCOUNTER — Telehealth: Payer: Self-pay | Admitting: *Deleted

## 2016-06-18 ENCOUNTER — Encounter: Payer: Self-pay | Admitting: Pain Medicine

## 2016-06-18 ENCOUNTER — Ambulatory Visit: Payer: 59 | Attending: Pain Medicine | Admitting: Pain Medicine

## 2016-06-18 VITALS — BP 142/90 | HR 76 | Temp 98.3°F | Resp 18 | Ht 74.0 in | Wt 227.0 lb

## 2016-06-18 DIAGNOSIS — R937 Abnormal findings on diagnostic imaging of other parts of musculoskeletal system: Secondary | ICD-10-CM

## 2016-06-18 DIAGNOSIS — M545 Low back pain: Secondary | ICD-10-CM | POA: Diagnosis not present

## 2016-06-18 DIAGNOSIS — E669 Obesity, unspecified: Secondary | ICD-10-CM | POA: Diagnosis not present

## 2016-06-18 DIAGNOSIS — M47812 Spondylosis without myelopathy or radiculopathy, cervical region: Secondary | ICD-10-CM | POA: Diagnosis not present

## 2016-06-18 DIAGNOSIS — M25512 Pain in left shoulder: Secondary | ICD-10-CM | POA: Insufficient documentation

## 2016-06-18 DIAGNOSIS — M25511 Pain in right shoulder: Secondary | ICD-10-CM | POA: Diagnosis not present

## 2016-06-18 DIAGNOSIS — B1920 Unspecified viral hepatitis C without hepatic coma: Secondary | ICD-10-CM | POA: Diagnosis not present

## 2016-06-18 DIAGNOSIS — F17210 Nicotine dependence, cigarettes, uncomplicated: Secondary | ICD-10-CM | POA: Insufficient documentation

## 2016-06-18 DIAGNOSIS — G8929 Other chronic pain: Secondary | ICD-10-CM | POA: Diagnosis not present

## 2016-06-18 DIAGNOSIS — R7982 Elevated C-reactive protein (CRP): Secondary | ICD-10-CM | POA: Insufficient documentation

## 2016-06-18 DIAGNOSIS — R011 Cardiac murmur, unspecified: Secondary | ICD-10-CM | POA: Diagnosis not present

## 2016-06-18 DIAGNOSIS — M868X9 Other osteomyelitis, unspecified sites: Secondary | ICD-10-CM | POA: Insufficient documentation

## 2016-06-18 DIAGNOSIS — M961 Postlaminectomy syndrome, not elsewhere classified: Secondary | ICD-10-CM

## 2016-06-18 DIAGNOSIS — M539 Dorsopathy, unspecified: Secondary | ICD-10-CM | POA: Diagnosis not present

## 2016-06-18 DIAGNOSIS — Z79891 Long term (current) use of opiate analgesic: Secondary | ICD-10-CM

## 2016-06-18 DIAGNOSIS — L97509 Non-pressure chronic ulcer of other part of unspecified foot with unspecified severity: Secondary | ICD-10-CM | POA: Diagnosis not present

## 2016-06-18 DIAGNOSIS — N529 Male erectile dysfunction, unspecified: Secondary | ICD-10-CM | POA: Diagnosis not present

## 2016-06-18 DIAGNOSIS — M79606 Pain in leg, unspecified: Secondary | ICD-10-CM | POA: Insufficient documentation

## 2016-06-18 DIAGNOSIS — E785 Hyperlipidemia, unspecified: Secondary | ICD-10-CM | POA: Diagnosis not present

## 2016-06-18 DIAGNOSIS — M5416 Radiculopathy, lumbar region: Secondary | ICD-10-CM

## 2016-06-18 DIAGNOSIS — E119 Type 2 diabetes mellitus without complications: Secondary | ICD-10-CM | POA: Diagnosis not present

## 2016-06-18 DIAGNOSIS — I340 Nonrheumatic mitral (valve) insufficiency: Secondary | ICD-10-CM | POA: Diagnosis not present

## 2016-06-18 DIAGNOSIS — M549 Dorsalgia, unspecified: Secondary | ICD-10-CM | POA: Diagnosis present

## 2016-06-18 DIAGNOSIS — I10 Essential (primary) hypertension: Secondary | ICD-10-CM | POA: Diagnosis not present

## 2016-06-18 DIAGNOSIS — F1190 Opioid use, unspecified, uncomplicated: Secondary | ICD-10-CM

## 2016-06-18 DIAGNOSIS — M47816 Spondylosis without myelopathy or radiculopathy, lumbar region: Secondary | ICD-10-CM

## 2016-06-18 DIAGNOSIS — M47896 Other spondylosis, lumbar region: Secondary | ICD-10-CM | POA: Insufficient documentation

## 2016-06-18 NOTE — Progress Notes (Deleted)
Patient's Name: Willie Krueger  Patient type: Established  MRN: [id number]  Service setting: Ambulatory outpatient  DOB: Apr 29, 1956  Location: [facility]  DOS: 06/18/2016  Primary Care Physician: [facility]  Note by: Beatriz Chancellor A. Dossie Arbour, M.D, DABA, DABAPM, DABPM, DABIPP, FIPP  Referring Physician: No ref. provider found  Specialty: Board-Certified Interventional Pain Management  Last Visit to Pain Management: 06/04/2016   Primary Reason(s) for Visit: Encounter for post-procedure evaluation of chronic illness with mild to moderate exacerbation CC: Back Pain and Leg Pain   HPI  Mr. Hellyer is a 60 y.o. year old, male patient, who returns today as an established patient. He has Toe ulcer ([place]); Cervical spinal cord compression ([place]); Cardiac murmur; Chronic hepatitis C virus infection ([place]); HLD (hyperlipidemia); Benign hypertension; ED (erectile dysfunction) of organic origin; MI (mitral incompetence); Adiposity; Current smoker; History of decompression of median nerve; Chronic pain; Long term current use of opiate analgesic; Long term prescription opiate use; Opiate use (112.5 MME/Day); Opiate dependence ([place]); Encounter for therapeutic drug level monitoring; Chronic neck pain (Right side); Cervical spondylosis; Cervical post-laminectomy syndrome; Chronic low back pain (Location of Primary Source of Pain) (Midline pain) (R>L); Chronic cervical radicular pain (Bilateral) (R>L); Chronic lumbar radicular pain (Bilateral) (R>L); Substance use disorder Risk: LOW; Failed back surgical syndrome; Chronic lower extremity pain (Location of Secondary source of pain) (Bilateral) (R>L); History of carpal tunnel release of both wrists; Grade I anterolisthesis of C7 on T1; Encounter for chronic pain management; Elevated C-reactive protein (CRP); Diabetic osteomyelitis ([place]); Neuropathy ([place]); Obesity; Neurogenic pain; 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 His primarily concern today is the Back Pain and Leg Pain   Pain Assessment: Self-Reported Pain Score: 4  Reported level is compatible with observation       Pain Type: Chronic pain Pain Location: Back (legs) Pain Orientation: Lower Pain Descriptors / Indicators: Aching, Burning Pain Frequency: Constant  The patient comes into the clinics today for post-procedure evaluation on the interventional treatment done on 06/03/2016.  Date of Last Visit: 06/03/16 Service Provided on Last Visit: Procedure (blf)  Post-Procedure Assessment  Procedure done on last visit: Diagnostic bilateral lumbar facet block under fluoroscopic guidance and IV sedation. Side-effects or Adverse reactions: None reported Sedation: Sedation given  Results: Ultra-Short Term Relief (First 1 hour after procedure): 0 %  Analgesia during this period is likely to be Local Anesthetic and/or IV Sedative (Analgesic/Anxiolitic) related Short Term Relief (Initial 4-6 hrs after procedure): 0 % Complete relief confirms area to be the source of pain Long Term Relief : 0 % Long-term benefit would suggest an inflammatory etiology to the pain   Current Relief (Now):         Persistent relief would suggest effective anti-inflammatory effects from steroids Interpretation of Results: ***  Laboratory Chemistry  Inflammation Markers Lab Results  Component Value Date   ESRSEDRATE 6 03/17/2016   CRP 1.2* 03/17/2016    Renal Function Lab Results  Component Value Date   BUN 16 03/04/2016   CREATININE 0.98 03/04/2016   GFRAA >60 03/04/2016   GFRNONAA >60 03/04/2016    Hepatic Function Lab Results  Component Value Date   AST 23 03/04/2016   ALT 23 03/04/2016   ALBUMIN  3.4* 03/04/2016  Electrolytes Lab Results  Component Value Date   NA 138 03/04/2016   K 4.8 03/04/2016   CL 107 03/04/2016   CALCIUM 9.7 03/04/2016   MG 1.9 02/27/2016    Pain Modulating Vitamins No results found for: VD25OH, VD125OH2TOT, [id number], [id number], VITAMINB12  Coagulation Parameters Lab Results  Component Value Date   PLT 177 03/04/2016    Note: Labs Reviewed.  Recent Diagnostic Imaging  Ir Fluoro Guide Cv Line Right  03/10/2016  INDICATION: Osteomyelitis of the right foot. Request PICC line placement for prolonged IV antibiotics. EXAM: PICC LINE PLACEMENT WITH ULTRASOUND AND FLUOROSCOPIC GUIDANCE MEDICATIONS: None; ANESTHESIA/SEDATION: Moderate Sedation Time:  None The patient was continuously monitored during the procedure by the interventional radiology nurse under my direct supervision. FLUOROSCOPY TIME:  Fluoroscopy Time: 12 seconds COMPLICATIONS: None immediate. PROCEDURE: The patient was advised of the possible risks and complications and agreed to undergo the procedure. The patient was then brought to the angiographic suite for the procedure. The right arm was prepped with chlorhexidine, draped in the usual sterile fashion using maximum barrier technique (cap and mask, sterile gown, sterile gloves, large sterile sheet, hand hygiene and cutaneous antiseptic). Local anesthesia was attained by infiltration with 1% lidocaine. Ultrasound demonstrated patency of the brachial vein, and this was documented with an image. Under real-time ultrasound guidance, this vein was accessed with a 21 gauge micropuncture needle and image documentation was performed. The needle was exchanged over a guidewire for a peel-away sheath through which a 43 cm 5 [place] single lumen power injectable PICC was advanced, and positioned with its tip at the lower SVC/right atrial junction. Fluoroscopy during the procedure and fluoro spot radiograph confirms appropriate catheter position. The catheter  was flushed, secured to the skin with Prolene sutures, and covered with a sterile dressing. IMPRESSION: Successful placement of a right arm PICC with sonographic and fluoroscopic guidance. The catheter is ready for use. Read by: Ascencion Dike PA-C Electronically Signed   By: Corrie Mckusick D.O.   On: 03/10/2016 10:19   Ir US Guide Vasc Access Right  03/10/2016  INDICATION: Osteomyelitis of the right foot. Request PICC line placement for prolonged IV antibiotics. EXAM: PICC LINE PLACEMENT WITH ULTRASOUND AND FLUOROSCOPIC GUIDANCE MEDICATIONS: None; ANESTHESIA/SEDATION: Moderate Sedation Time:  None The patient was continuously monitored during the procedure by the interventional radiology nurse under my direct supervision. FLUOROSCOPY TIME:  Fluoroscopy Time: 12 seconds COMPLICATIONS: None immediate. PROCEDURE: The patient was advised of the possible risks and complications and agreed to undergo the procedure. The patient was then brought to the angiographic suite for the procedure. The right arm was prepped with chlorhexidine, draped in the usual sterile fashion using maximum barrier technique (cap and mask, sterile gown, sterile gloves, large sterile sheet, hand hygiene and cutaneous antiseptic). Local anesthesia was attained by infiltration with 1% lidocaine. Ultrasound demonstrated patency of the brachial vein, and this was documented with an image. Under real-time ultrasound guidance, this vein was accessed with a 21 gauge micropuncture needle and image documentation was performed. The needle was exchanged over a guidewire for a peel-away sheath through which a 43 cm 5 [place] single lumen power injectable PICC was advanced, and positioned with its tip at the lower SVC/right atrial junction. Fluoroscopy during the procedure and fluoro spot radiograph confirms appropriate catheter position. The catheter was flushed, secured to the skin with Prolene sutures, and covered with a sterile dressing. IMPRESSION:  Successful placement of a right arm PICC with sonographic and fluoroscopic guidance. The catheter is  ready for use. Read by: Ascencion Dike PA-C Electronically Signed   By: Corrie Mckusick D.O.   On: 03/10/2016 10:19   Note: Imaging reviewed.  Meds  The patient has a current medication list which includes the following prescription(s): acetaminophen, aspirin ec, vitamin d3, diphenhydramine-acetaminophen, furosemide, gabapentin, glimepiride, lisinopril, metformin, naloxone, oxycodone, oxycodone, oxycodone, pravastatin, sildenafil, aspirin ec, vitamin d3, furosemide, gabapentin, glimepiride, lisinopril, metformin, phenylephrine, pravastatin, and sildenafil.  Current Outpatient Prescriptions on File Prior to Visit  Medication Sig  . acetaminophen (TYLENOL) 500 MG tablet Take 500 mg by mouth every 4 (four) hours. Scheduled (5 times daily)  . aspirin EC 81 MG tablet Take 81 mg by mouth daily.  . Cholecalciferol (VITAMIN D3) 2000 units capsule Take 2,000 Units by mouth daily.   . diphenhydramine-acetaminophen (TYLENOL PM) 25-500 MG TABS tablet Take 2 tablets by mouth at bedtime. Reported on 03/17/2016  . furosemide (LASIX) 20 MG tablet Take 40 mg by mouth 2 (two) times daily.   Marland Kitchen gabapentin (NEURONTIN) 600 MG tablet Take 2 tablets (1,200 mg total) by mouth every 6 (six) hours as needed.  Marland Kitchen glimepiride (AMARYL) 2 MG tablet Take 2 mg by mouth daily with breakfast.   . lisinopril (PRINIVIL,ZESTRIL) 40 MG tablet Take 40 mg by mouth daily.  . metFORMIN (GLUCOPHAGE-XR) 500 MG 24 hr tablet Take 500 mg by mouth 2 (two) times daily.  . naloxone (NARCAN) 2 MG/2ML injection Inject content of syringe into thigh muscle. Call 911.  Marland Kitchen oxyCODONE (ROXICODONE) 15 MG immediate release tablet Take 1 tablet (15 mg total) by mouth 5 (five) times daily as needed for pain.  Marland Kitchen oxyCODONE (ROXICODONE) 15 MG immediate release tablet Take 1 tablet (15 mg total) by mouth 5 (five) times daily as needed for pain.  Marland Kitchen oxyCODONE  (ROXICODONE) 15 MG immediate release tablet Take 1 tablet (15 mg total) by mouth 5 (five) times daily as needed for pain.  . pravastatin (PRAVACHOL) 20 MG tablet Take 20 mg by mouth at bedtime.   . sildenafil (VIAGRA) 100 MG tablet Take 100 mg by mouth daily as needed for erectile dysfunction.   No current facility-administered medications on file prior to visit.    ROS  Constitutional: Denies any fever or chills Gastrointestinal: No reported hemesis, hematochezia, vomiting, or acute GI distress Musculoskeletal: Denies any acute onset joint swelling, redness, loss of ROM, or weakness Neurological: No reported episodes of acute onset apraxia, aphasia, dysarthria, agnosia, amnesia, paralysis, loss of coordination, or loss of consciousness  Allergies  Mr. Stare is allergic to cleocin and sulfa antibiotics.  [place]  Medical:  Mr. Garr  has a past medical history of Diabetes mellitus without complication ([place]); Hypertension; Chronic pain; Hyperlipidemia; Osteoarthritis; Heart murmur; Mitral valve regurgitation; History of spinal stenosis (10/31/2015); Leg weakness (07/07/2014); Nerve root inflammation (06/26/2014); and Chronic pain associated with significant psychosocial dysfunction (05/04/2015). Family: family history includes Cancer in his mother; Diabetes in his mother; Heart disease in his father. Surgical:  has past surgical history that includes Carpal tunnel release and Spine surgery. Tobacco:  reports that he has been smoking Cigarettes.  He has been smoking about 1.50 packs per day. He has never used smokeless tobacco. Alcohol:  reports that he does not drink alcohol. Drug:  reports that he does not use illicit drugs.  Constitutional Exam  Vitals: Blood pressure 142/90, pulse 76, temperature 98.3 F (36.8 C), resp. rate 18, height 6\' 2"  (1.88 m), weight 227 lb (102.967 kg), SpO2 98 %. General appearance: Well nourished, well developed,  and well hydrated. In no acute distress Calculated  BMI/Body habitus: Body mass index is 29.13 kg/(m^2).       Psych/Mental status: Alert and oriented x 3 (person, place, & time) Eyes: PERLA Respiratory: No evidence of acute respiratory distress  Cervical Spine Exam  Inspection: No masses, redness, or swelling Alignment: Symmetrical ROM: Functional: ROM is within functional limits [facility]) Stability: No instability detected Muscle strength & Tone: Functionally intact Sensory: Unimpaired Palpation: No complaints of tenderness  Upper Extremity (UE) Exam    Side: Right upper extremity  Side: Left upper extremity  Inspection: No masses, redness, swelling, or asymmetry  Inspection: No masses, redness, swelling, or asymmetry  ROM:  ROM:  Functional: ROM is within functional limits [facility])  Functional: ROM is within functional limits [facility])  Muscle strength & Tone: Functionally intact  Muscle strength & Tone: Functionally intact  Sensory: Unimpaired  Sensory: Unimpaired  Palpation: Non-contributory  Palpation: Non-contributory   Thoracic Spine Exam  Inspection: No masses, redness, or swelling Alignment: Symmetrical ROM: Functional: ROM is within functional limits [facility]) Stability: No instability detected Sensory: Unimpaired Muscle strength & Tone: Functionally intact Palpation: No complaints of tenderness  Lumbar Spine Exam  Inspection: No masses, redness, or swelling Alignment: Symmetrical ROM: Functional: ROM is within functional limits [facility]) Stability: No instability detected Muscle strength & Tone: Functionally intact Sensory: Unimpaired Palpation: No complaints of tenderness Provocative Tests: Lumbar Hyperextension and rotation test: deferred Patrick's Maneuver: deferred  Gait & Posture Assessment  Ambulation: Unassisted Gait: Unaffected Posture: WNL  Lower Extremity Exam    Side: Right lower extremity  Side: Left lower extremity  Inspection: No masses, redness, swelling, or asymmetry ROM:  Inspection: No masses, redness,  swelling, or asymmetry ROM:  Functional: ROM is within functional limits [facility])  Functional: ROM is within functional limits [facility])  Muscle strength & Tone: Functionally intact  Muscle strength & Tone: Functionally intact  Sensory: Unimpaired  Sensory: Unimpaired  Palpation: Non-contributory  Palpation: Non-contributory   Assessment & Plan  Primary Diagnosis & Pertinent Problem List: There were no encounter diagnoses.  Visit Diagnosis: No diagnosis found.  Problem-specific Plan(s): No problem-specific assessment & plan notes found for this encounter.   Plan of Care   Problem List Items Addressed This Visit    None       Pharmacotherapy (Medications Ordered): No orders of the defined types were placed in this encounter.    Lab-work & Procedure Ordered: No orders of the defined types were placed in this encounter.    Imaging Ordered: None  Interventional Therapies: Scheduled:  ***   Considering:  ***   PRN Procedures:  ***   Referral(s) or Consult(s): None at this time.  Medications administered during this visit: Mr. Johnson had no medications administered during this visit.  Requested PM Follow-up: No Follow-up on file.  Future Appointments Date Time Provider [place]  08/11/2016 9:40 AM Milinda Pointer, MD [facility] None    Primary Care Physician: [facility] PRIMARY [facility]ARE Location: [facility] Note by: Kathlen Brunswick. Dossie Arbour, M.D, DABA, DABAPM, DABPM, DABIPP, FIPP  Pain Score Disclaimer: We use the NRS-11 scale. This is a self-reported, subjective measurement of pain severity with only modest accuracy. It is used primarily to identify changes within a particular patient. It must be understood that outpatient pain scales are significantly less accurate that those used for research, where they can be applied under ideal controlled circumstances with minimal exposure to variables. In reality, the score is likely to be a combination  of pain intensity and pain affect, where pain affect describes the degree of emotional arousal or changes in action readiness caused by the sensory experience of pain. Factors such as social and work situation, setting, emotional state, anxiety levels, expectation, and prior pain experience may influence pain perception and show large inter-individual differences that may also be affected by time variables.  Patient instructions provided during this appointment: Patient Instructions  Smoking Cessation, Tips for Success If you are ready to quit smoking, congratulations! You have chosen to help yourself be healthier. Cigarettes bring nicotine, tar, carbon monoxide, and other irritants into your body. Your lungs, heart, and blood vessels will be able to work better without these poisons. There are many different ways to quit smoking. Nicotine gum, nicotine patches, a nicotine inhaler, or nicotine nasal spray can help with physical craving. Hypnosis, support groups, and medicines help break the habit of smoking. WHAT THINGS CAN I DO TO MAKE QUITTING EASIER?  Here are some tips to help you quit for good:  Pick a date when you will quit smoking completely. Tell all of your friends and family about your plan to quit on that date.  Do not try to slowly cut down on the number of cigarettes you are smoking. Pick a quit date and quit smoking completely starting on that day.  Throw away all cigarettes.   Clean and remove all ashtrays from your home, work, and car.  On a card, write down your reasons for quitting. Carry the card with you and read it when you get the urge to smoke.  Cleanse your body of nicotine. Drink enough water and fluids to keep your urine clear or pale yellow. Do this after quitting to flush the nicotine from your body.  Learn to predict your moods. Do not let a bad situation be your excuse to have a cigarette. Some situations in your life might tempt you into wanting a  cigarette.  Never have "just one" cigarette. It leads to wanting another and another. Remind yourself of your decision to quit.  Change habits associated with smoking. If you smoked while driving or when feeling stressed, try other activities to replace smoking. Stand up when drinking your coffee. Brush your teeth after eating. Sit in a different chair when you read the paper. Avoid alcohol while trying to quit, and try to drink fewer caffeinated beverages. Alcohol and caffeine may urge you to smoke.  Avoid foods and drinks that can trigger a desire to smoke, such as sugary or spicy foods and alcohol.  Ask people who smoke not to smoke around you.  Have something planned to do right after eating or having a cup of coffee. For example, plan to take a walk or exercise.  Try a relaxation exercise to calm you down and decrease your stress. Remember, you may be tense and nervous for the first 2 weeks after you quit, but this will pass.  Find new activities to keep your hands busy. Play with a pen, coin, or rubber band. Doodle or draw things on paper.  Brush your teeth right after eating. This will help cut down on the craving for the taste of tobacco after meals. You can also try mouthwash.   Use oral substitutes in place of cigarettes. Try using lemon drops, carrots, cinnamon sticks, or chewing gum. Keep them handy so they are available when you have the urge to smoke.  When you have the urge to smoke, try deep breathing.  Designate your home as  a nonsmoking area.  If you are a heavy smoker, ask your health care provider about a prescription for nicotine chewing gum. It can ease your withdrawal from nicotine.  Reward yourself. Set aside the cigarette money you save and buy yourself something nice.  Look for support from others. Join a support group or smoking cessation program. Ask someone at home or at work to help you with your plan to quit smoking.  Always ask yourself, "Do I need this  cigarette or is this just a reflex?" Tell yourself, "Today, I choose not to smoke," or "I do not want to smoke." You are reminding yourself of your decision to quit.  Do not replace cigarette smoking with electronic cigarettes (commonly called e-cigarettes). The safety of e-cigarettes is unknown, and some may contain harmful chemicals.  If you relapse, do not give up! Plan ahead and think about what you will do the next time you get the urge to smoke. HOW WILL I FEEL WHEN I QUIT SMOKING? You may have symptoms of withdrawal because your body is used to nicotine (the addictive substance in cigarettes). You may crave cigarettes, be irritable, feel very hungry, cough often, get headaches, or have difficulty concentrating. The withdrawal symptoms are only temporary. They are strongest when you first quit but will go away within 10-14 days. When withdrawal symptoms occur, stay in control. Think about your reasons for quitting. Remind yourself that these are signs that your body is healing and getting used to being without cigarettes. Remember that withdrawal symptoms are easier to treat than the major diseases that smoking can cause.  Even after the withdrawal is over, expect periodic urges to smoke. However, these cravings are generally short lived and will go away whether you smoke or not. Do not smoke! WHAT RESOURCES ARE AVAILABLE TO HELP ME QUIT SMOKING? Your health care provider can direct you to community resources or hospitals for support, which may include:  Group support.  Education.  Hypnosis.  Therapy.   This information is not intended to replace advice given to you by your health care provider. Make sure you discuss any questions you have with your health care provider.   Document Released: 09/12/2004 Document Revised: 01/05/2015 Document Reviewed: 06/02/2013 Elsevier Interactive Patient Education [employer].

## 2016-06-18 NOTE — Progress Notes (Signed)
Patient's Name: Willie Krueger  Patient type: Established  MRN: [id number]  Service setting: Ambulatory outpatient  DOB: [birth year]  Location: [facility]  DOS: 06/18/2016  Primary Care Physician: [facility] PR[facility]  Note by: Beatriz Chancellor A. Dossie Arbour, M.D, DABA, DABAPM, DABPM, DABIPP, FIPP  Referring Physician: No ref. provider found  Specialty: Board-Certified Interventional Pain Management  Last Visit to Pain Management: 06/04/2016   Primary Reason(s) for Visit: Encounter for prescription drug management & post-procedure evaluation of chronic illness with mild to moderate exacerbation(Level of risk: moderate) CC: Back Pain and Leg Pain   HPI  Mr. Kirkpatrick is a 60 y.o. year old, male patient, who returns today as an established patient. He has Toe ulcer ([place]); Cervical spinal cord compression ([place]); Cardiac murmur; Chronic hepatitis C virus infection ([place]); HLD (hyperlipidemia); Benign hypertension; ED (erectile dysfunction) of organic origin; MI (mitral incompetence); Adiposity; Current smoker; History of decompression of median nerve; Chronic pain; Long term current use of opiate analgesic; Long term prescription opiate use; Opiate use (112.5 MME/Day); Opiate dependence ([place]); Encounter for therapeutic drug level monitoring; Chronic neck pain (Right side); Cervical spondylosis; Cervical post-laminectomy syndrome; Chronic low back pain (Location of Primary Source of Pain) (Midline pain) (R>L); Chronic cervical radicular pain (Bilateral) (R>L); Chronic lumbar radicular pain (Bilateral) (R>L); Substance use disorder Risk: LOW; Failed back surgical syndrome; Chronic lower extremity pain (Location of Secondary source of pain) (Bilateral) (R>L); History of carpal tunnel release of both wrists; Grade I anterolisthesis of C7 on T1; Encounter for chronic pain management; Elevated C-reactive protein (CRP); Diabetic osteomyelitis ([place]); Neuropathy ([place]); Obesity; Neurogenic pain; 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 His primarily concern today is the Back Pain and Leg Pain   Pain Assessment: Self-Reported Pain Score: 4  Reported level is compatible with observation       Pain Type: Chronic pain Pain Location: Back (legs) Pain Orientation: Lower Pain Descriptors / Indicators: Aching, Burning Pain Frequency: Constant  The patient comes into the clinics today for post-procedure evaluation on the interventional treatment done on 06/03/2016. In addition, he comes in today for pharmacological management of his chronic pain.  The patient  reports that he does not use illicit drugs.   Apparently I did not do a good job but communicating to the patient how to evaluate his procedure since he rated his arm pain and all areas of the body that were simply not included. Today I have spent over 30 minutes talking to the patient about the proper follow-up and evaluation of these diagnostic injections and we have come to the conclusion that we need to repeat the first procedure as the results are not accurate.  Date of Last Visit: 06/03/16 Service Provided on Last Visit: Procedure (blf)  Controlled Substance Pharmacotherapy Assessment & REMS (Risk Evaluation and Mitigation Strategy)  Analgesic: Oxycodone IR 15 mg 5 times a day (75 mg/day). The patient inform the nurse that he takes the medication in a different manner and that he will continue to do so as he feels he needs it. He will probably run out of medicine early this is a problem that we will need to deal with later on as he is not being compliant with his medication regimen. In addition I cannot have a patient be  defiant asked to how he takes his  medications since this could be very unsafe. MME/day: 112.5 mg/day Pill Count: The patient did not bring any of his pills to this appointment since he was not schedule to have a medication refill. Pharmacokinetics: Onset of action (Liberation/Absorption): Within expected pharmacological parameters Time to Peak effect (Distribution): Timing and results are as within normal expected parameters Duration of action (Metabolism/Excretion): Within normal limits for medication Pharmacodynamics: Analgesic Effect: More than 50% Activity Facilitation: Medication(s) allow patient to sit, stand, walk, and do the basic ADLs Perceived Effectiveness: Described as relatively effective, allowing for increase in activities of daily living (ADL) Side-effects or Adverse reactions: None reported Monitoring: [place] PMP: Online review of the past 68-month period conducted. Compliant with practice rules and regulations Last UDS on record: TOXASSURE SELECT 13  Date Value Ref Range Status  05/28/2016 FINAL  Final    Comment:    ==================================================================== TOXASSURE SELECT 13 (MW) ==================================================================== Test                             Result       Flag       Units Drug Present and Declared for Prescription Verification   Oxycodone                      >4975        EXPECTED   ng/mg creat   Oxymorphone                    1287         EXPECTED   ng/mg creat   Noroxycodone                   >4975        EXPECTED   ng/mg creat   Noroxymorphone                 354          EXPECTED   ng/mg creat    Sources of oxycodone are scheduled prescription medications.    Oxymorphone, noroxycodone, and noroxymorphone are expected    metabolites of oxycodone. Oxymorphone is also available as a    scheduled prescription medication. ==================================================================== Test                      Result    Flag   Units       Ref Range   Creatinine              201              mg/dL      >=20 ==================================================================== Declared Medications:  The flagging and interpretation on this report are based on the  following declared medications.  Unexpected results may arise from  inaccuracies in the declared medications.  **Note: The testing scope of this panel includes these medications:  Oxycodone (Roxicodone)  **Note: The testing scope of this panel does not include following  reported medications:  Acetaminophen  Acetaminophen (Tylenol PM)  Aspirin  Diphenhydramine (Tylenol PM)  Furosemide (Lasix)  Gabapentin  Glimepiride (Amaryl)  Lisinopril  Metformin  Naloxone (Narcan)  Pravastatin (Pravachol)  Sildenafil (Viagra)  Vitamin D3 ==================================================================== For clinical consultation, please call [phone]. ====================================================================    UDS interpretation: Compliant          Medication Assessment Form: Reviewed. Patient indicates being compliant with therapy Treatment compliance:  Compliant Risk Assessment: Aberrant Behavior: None observed today Substance Use Disorder (SUD) Risk Level: Low Risk of opioid abuse or dependence: 0.7-3.0% with doses ? 36 MME/day and 6.1-26% with doses ? 120 MME/day. Opioid Risk Tool (ORT) Score:  0 Low Risk for SUD (Score <3) Depression Scale Score: PHQ-2: PHQ-2 Total Score: 0 No depression (0) PHQ-9: PHQ-9 Total Score: 0 No depression (0-4)  Pharmacologic Plan: No change in therapy, at this time  Post-Procedure Assessment  Procedure done on last visit: Diagnostic bilateral lumbar facet block under fluoroscopic guidance and IV sedation. Side-effects or Adverse reactions: None reported Sedation: Sedation given  Results: Ultra-Short Term Relief (First 1 hour after procedure): 90 %  Analgesia during this period is likely to be Local  Anesthetic and/or IV Sedative (Analgesic/Anxiolitic) related Short Term Relief (Initial 4-6 hrs after procedure): 0 % Poor understanding of how to keep his pain diary. Long Term Relief : 0 % No benefit could suggest etiology to be non-inflammatory, possibly compressive   Note: Today I had a long conversation with the patient regarding this and it turns out that he was under the impression that the injection in his lower back was going to eliminate all of hips pain, including that of his neck, upper extremities, and everywhere else. Because of this, when he rated the procedure he looked it under this light. He did not pay attention to the area that we had injected. Because of this, the results of this particular test are not valid. We will need to repeat it again.  Laboratory Chemistry  Inflammation Markers Lab Results  Component Value Date   ESRSEDRATE 6 03/17/2016   CRP 1.2* 03/17/2016    Renal Function Lab Results  Component Value Date   BUN 16 03/04/2016   CREATININE 0.98 03/04/2016   GFRAA >60 03/04/2016   GFRNONAA >60 03/04/2016    Hepatic Function Lab Results  Component Value Date   AST 23 03/04/2016   ALT 23 03/04/2016   ALBUMIN 3.4* 03/04/2016    Electrolytes Lab Results  Component Value Date   NA 138 03/04/2016   K 4.8 03/04/2016   CL 107 03/04/2016   CALCIUM 9.7 03/04/2016   MG 1.9 02/27/2016    Pain Modulating Vitamins No results found for: VD25OH, VD125OH2TOT, H157544, [id number], VITAMINB12  Coagulation Parameters Lab Results  Component Value Date   PLT 177 03/04/2016    Cardiovascular Lab Results  Component Value Date   BNP 400* 01/02/2014   HGB 13.9 03/04/2016   HCT 41.6 03/04/2016    Note: Labs reviewed.  Recent Diagnostic Imaging  Ir Fluoro Guide Cv Line Right  03/10/2016  INDICATION: Osteomyelitis of the right foot. Request PICC line placement for prolonged IV antibiotics. EXAM: PICC LINE PLACEMENT WITH ULTRASOUND AND FLUOROSCOPIC  GUIDANCE MEDICATIONS: None; ANESTHESIA/SEDATION: Moderate Sedation Time:  None The patient was continuously monitored during the procedure by the interventional radiology nurse under my direct supervision. FLUOROSCOPY TIME:  Fluoroscopy Time: 12 seconds COMPLICATIONS: None immediate. PROCEDURE: The patient was advised of the possible risks and complications and agreed to undergo the procedure. The patient was then brought to the angiographic suite for the procedure. The right arm was prepped with chlorhexidine, draped in the usual sterile fashion using maximum barrier technique (cap and mask, sterile gown, sterile gloves, large sterile sheet, hand hygiene and cutaneous antiseptic). Local anesthesia was attained by infiltration with 1% lidocaine. Ultrasound demonstrated patency of the brachial vein, and this was documented with an image. Under real-time ultrasound guidance, this vein  was accessed with a 21 gauge micropuncture needle and image documentation was performed. The needle was exchanged over a guidewire for a peel-away sheath through which a 43 cm 5 [place] single lumen power injectable PICC was advanced, and positioned with its tip at the lower SVC/right atrial junction. Fluoroscopy during the procedure and fluoro spot radiograph confirms appropriate catheter position. The catheter was flushed, secured to the skin with Prolene sutures, and covered with a sterile dressing. IMPRESSION: Successful placement of a right arm PICC with sonographic and fluoroscopic guidance. The catheter is ready for use. Read by: Ascencion Dike PA-C Electronically Signed   By: Corrie Mckusick D.O.   On: 03/10/2016 10:19   Ir US Guide Vasc Access Right  03/10/2016  INDICATION: Osteomyelitis of the right foot. Request PICC line placement for prolonged IV antibiotics. EXAM: PICC LINE PLACEMENT WITH ULTRASOUND AND FLUOROSCOPIC GUIDANCE MEDICATIONS: None; ANESTHESIA/SEDATION: Moderate Sedation Time:  None The patient was continuously  monitored during the procedure by the interventional radiology nurse under my direct supervision. FLUOROSCOPY TIME:  Fluoroscopy Time: 12 seconds COMPLICATIONS: None immediate. PROCEDURE: The patient was advised of the possible risks and complications and agreed to undergo the procedure. The patient was then brought to the angiographic suite for the procedure. The right arm was prepped with chlorhexidine, draped in the usual sterile fashion using maximum barrier technique (cap and mask, sterile gown, sterile gloves, large sterile sheet, hand hygiene and cutaneous antiseptic). Local anesthesia was attained by infiltration with 1% lidocaine. Ultrasound demonstrated patency of the brachial vein, and this was documented with an image. Under real-time ultrasound guidance, this vein was accessed with a 21 gauge micropuncture needle and image documentation was performed. The needle was exchanged over a guidewire for a peel-away sheath through which a 43 cm 5 [place] single lumen power injectable PICC was advanced, and positioned with its tip at the lower SVC/right atrial junction. Fluoroscopy during the procedure and fluoro spot radiograph confirms appropriate catheter position. The catheter was flushed, secured to the skin with Prolene sutures, and covered with a sterile dressing. IMPRESSION: Successful placement of a right arm PICC with sonographic and fluoroscopic guidance. The catheter is ready for use. Read by: Ascencion Dike PA-C Electronically Signed   By: Corrie Mckusick D.O.   On: 03/10/2016 10:19   Meds  The patient has a current medication list which includes the following prescription(s): acetaminophen, aspirin ec, vitamin d3, diphenhydramine-acetaminophen, furosemide, gabapentin, glimepiride, lisinopril, metformin, naloxone, oxycodone, oxycodone, oxycodone, pravastatin, sildenafil, aspirin ec, vitamin d3, furosemide, gabapentin, glimepiride, lisinopril, metformin, phenylephrine, pravastatin, and  sildenafil.  Current Outpatient Prescriptions on File Prior to Visit  Medication Sig  . acetaminophen (TYLENOL) 500 MG tablet Take 500 mg by mouth every 4 (four) hours. Scheduled (5 times daily)  . aspirin EC 81 MG tablet Take 81 mg by mouth daily.  . Cholecalciferol (VITAMIN D3) 2000 units capsule Take 2,000 Units by mouth daily.   . diphenhydramine-acetaminophen (TYLENOL PM) 25-500 MG TABS tablet Take 2 tablets by mouth at bedtime. Reported on 03/17/2016  . furosemide (LASIX) 20 MG tablet Take 40 mg by mouth 2 (two) times daily.   Marland Kitchen gabapentin (NEURONTIN) 600 MG tablet Take 2 tablets (1,200 mg total) by mouth every 6 (six) hours as needed.  Marland Kitchen glimepiride (AMARYL) 2 MG tablet Take 2 mg by mouth daily with breakfast.   . lisinopril (PRINIVIL,ZESTRIL) 40 MG tablet Take 40 mg by mouth daily.  . metFORMIN (GLUCOPHAGE-XR) 500 MG 24 hr tablet Take 500 mg by mouth 2 (  two) times daily.  . naloxone (NARCAN) 2 MG/2ML injection Inject content of syringe into thigh muscle. Call 911.  Marland Kitchen oxyCODONE (ROXICODONE) 15 MG immediate release tablet Take 1 tablet (15 mg total) by mouth 5 (five) times daily as needed for pain.  Marland Kitchen oxyCODONE (ROXICODONE) 15 MG immediate release tablet Take 1 tablet (15 mg total) by mouth 5 (five) times daily as needed for pain.  Marland Kitchen oxyCODONE (ROXICODONE) 15 MG immediate release tablet Take 1 tablet (15 mg total) by mouth 5 (five) times daily as needed for pain.  . pravastatin (PRAVACHOL) 20 MG tablet Take 20 mg by mouth at bedtime.   . sildenafil (VIAGRA) 100 MG tablet Take 100 mg by mouth daily as needed for erectile dysfunction.   No current facility-administered medications on file prior to visit.    ROS  Constitutional: Denies any fever or chills Gastrointestinal: No reported hemesis, hematochezia, vomiting, or acute GI distress Musculoskeletal: Denies any acute onset joint swelling, redness, loss of ROM, or weakness Neurological: No reported episodes of acute onset apraxia,  aphasia, dysarthria, agnosia, amnesia, paralysis, loss of coordination, or loss of consciousness  Allergies  Mr. Lukehart is allergic to cleocin and sulfa antibiotics.  [place]  Medical:  Mr. Rolls  has a past medical history of Diabetes mellitus without complication ([place]); Hypertension; Chronic pain; Hyperlipidemia; Osteoarthritis; Heart murmur; Mitral valve regurgitation; History of spinal stenosis (10/31/2015); Leg weakness (07/07/2014); Nerve root inflammation (06/26/2014); and Chronic pain associated with significant psychosocial dysfunction (05/04/2015). Family: family history includes Cancer in his mother; Diabetes in his mother; Heart disease in his father. Surgical:  has past surgical history that includes Carpal tunnel release and Spine surgery. Tobacco:  reports that he has been smoking Cigarettes.  He has been smoking about 1.50 packs per day. He has never used smokeless tobacco. Alcohol:  reports that he does not drink alcohol. Drug:  reports that he does not use illicit drugs.  Constitutional Exam  Vitals: Blood pressure 142/90, pulse 76, temperature 98.3 F (36.8 C), resp. rate 18, height 6\' 2"  (1.88 m), weight 227 lb (102.967 kg), SpO2 98 %. General appearance: Well nourished, well developed, and well hydrated. In no acute distress Calculated BMI/Body habitus: Body mass index is 29.13 kg/(m^2). (25-29.9 kg/m2) Overweight - 20% higher incidence of chronic pain Psych/Mental status: Alert and oriented x 3 (person, place, & time) Eyes: PERLA Respiratory: No evidence of acute respiratory distress  Cervical Spine Exam  Inspection: No masses, redness, or swelling Alignment: Symmetrical ROM: Functional: ROM is within functional limits [facility]) Stability: No instability detected Muscle strength & Tone: Functionally intact Sensory: Unimpaired Palpation: No complaints of tenderness  Upper Extremity (UE) Exam    Side: Right upper extremity  Side: Left upper extremity  Inspection: No masses,  redness, swelling, or asymmetry  Inspection: No masses, redness, swelling, or asymmetry  ROM:  ROM:  Functional: ROM is within functional limits [facility])  Functional: ROM is within functional limits [facility])  Muscle strength & Tone: Functionally intact  Muscle strength & Tone: Functionally intact  Sensory: Unimpaired  Sensory: Unimpaired  Palpation: Non-contributory  Palpation: Non-contributory   Thoracic Spine Exam  Inspection: No masses, redness, or swelling Alignment: Symmetrical ROM: Functional: ROM is within functional limits [facility]) Stability: No instability detected Sensory: Unimpaired Muscle strength & Tone: Functionally intact Palpation: No complaints of tenderness  Lumbar Spine Exam  Inspection: No masses, redness, or swelling Alignment: Symmetrical ROM: Functional: Decreased ROM Stability: No instability detected Muscle strength & Tone: Functionally intact Sensory: Unimpaired Palpation:  Tender Provocative Tests: Lumbar Hyperextension and rotation test: Positive for bilateral lumbar facet pain Patrick's Maneuver: deferred  Gait & Posture Assessment  Ambulation: Unassisted Gait: Uneven Posture: Slouching  Lower Extremity Exam    Side: Right lower extremity  Side: Left lower extremity  Inspection: No masses, redness, swelling, or asymmetry ROM:  Inspection: No masses, redness, swelling, or asymmetry ROM:  Functional: ROM is within functional limits [facility])  Functional: ROM is within functional limits [facility])  Muscle strength & Tone: Functionally intact  Muscle strength & Tone: Functionally intact  Sensory: Unimpaired  Sensory: Unimpaired  Palpation: Non-contributory  Palpation: Non-contributory   Assessment & Plan  Primary Diagnosis & Pertinent Problem List: The primary encounter diagnosis was Abnormal MRI, lumbar spine (05/19/2016). Diagnoses of Failed back surgical syndrome, Lumbar facet syndrome (Location of Primary Source of Pain) (Bilateral) (R>L), Lumbar spondylosis,  unspecified spinal osteoarthritis, Chronic low back pain (Location of Primary Source of Pain) (Midline pain) (R>L), Chronic pain of lower extremity, unspecified laterality, Chronic lumbar radicular pain (Bilateral) (R>L), Opiate use (112.5 MME/Day), and Long term current use of opiate analgesic were also pertinent to this visit.  Visit Diagnosis: 1. Abnormal MRI, lumbar spine (05/19/2016)   2. Failed back surgical syndrome   3. Lumbar facet syndrome (Location of Primary Source of Pain) (Bilateral) (R>L)   4. Lumbar spondylosis, unspecified spinal osteoarthritis   5. Chronic low back pain (Location of Primary Source of Pain) (Midline pain) (R>L)   6. Chronic pain of lower extremity, unspecified laterality   7. Chronic lumbar radicular pain (Bilateral) (R>L)   8. Opiate use (112.5 MME/Day)   9. Long term current use of opiate analgesic     Problems updated and reviewed during this visit: No problems updated.  Problem-specific Plan(s): No problem-specific assessment & plan notes found for this encounter.  No new assessment & plan notes have been filed under this hospital service since the last note was generated. Service: Pain Management   Plan of Care   Problem List Items Addressed This Visit      High   Abnormal MRI, lumbar spine (05/19/2016) - Primary   Chronic low back pain (Location of Primary Source of Pain) (Midline pain) (R>L) (Chronic)   Relevant Medications   aspirin EC 81 MG tablet   Chronic lower extremity pain (Location of Secondary source of pain) (Bilateral) (R>L) (Chronic)   Chronic lumbar radicular pain (Bilateral) (R>L) (Chronic)   Failed back surgical syndrome (Chronic)   Relevant Medications   aspirin EC 81 MG tablet   Lumbar facet syndrome (Location of Primary Source of Pain) (Bilateral) (R>L) (Chronic)   Relevant Medications   aspirin EC 81 MG tablet   Other Relevant Orders   LUMBAR FACET(MEDIAL BRANCH NERVE BLOCK) MBNB   Lumbar spondylosis (Chronic)    Relevant Medications   aspirin EC 81 MG tablet     Medium   Long term current use of opiate analgesic (Chronic)   Opiate use (112.5 MME/Day) (Chronic)       Pharmacotherapy (Medications Ordered): No orders of the defined types were placed in this encounter.    Lab-work & Procedure Ordered: Orders Placed This Encounter  Procedures  . LUMBAR FACET(MEDIAL BRANCH NERVE BLOCK) MBNB    Imaging Ordered: None  Interventional Therapies: Scheduled:  Repeat diagnostic bilateral lumbar facet block under fluoroscopic guidance and IV sedation.    Considering:  Lumbar facet radiofrequency.    PRN Procedures:  None at this time.    Referral(s) or Consult(s): None at this time.  New Prescriptions   No medications on file    Medications administered during this visit: Mr. Degnan had no medications administered during this visit.  Requested PM Follow-up: Return for Procedure (Scheduled), Keep previously scheduled appointment..  Future Appointments Date Time Provider [place]  08/11/2016 9:40 AM Milinda Pointer, MD [facility] None    Primary Care Physician: [facility] PRIMARY CARE Location: [facility] Note by: Kathlen Brunswick. Dossie Arbour, M.D, DABA, DABAPM, DABPM, DABIPP, FIPP  Pain Score Disclaimer: We use the NRS-11 scale. This is a self-reported, subjective measurement of pain severity with only modest accuracy. It is used primarily to identify changes within a particular patient. It must be understood that outpatient pain scales are significantly less accurate that those used for research, where they can be applied under ideal controlled circumstances with minimal exposure to variables. In reality, the score is likely to be a combination of pain intensity and pain affect, where pain affect describes the degree of emotional arousal or changes in action readiness caused by the sensory experience of pain. Factors such as social and work situation, setting,  emotional state, anxiety levels, expectation, and prior pain experience may influence pain perception and show large inter-individual differences that may also be affected by time variables.  Patient instructions provided at this appointment:: Patient Instructions  Smoking Cessation, Tips for Success If you are ready to quit smoking, congratulations! You have chosen to help yourself be healthier. Cigarettes bring nicotine, tar, carbon monoxide, and other irritants into your body. Your lungs, heart, and blood vessels will be able to work better without these poisons. There are many different ways to quit smoking. Nicotine gum, nicotine patches, a nicotine inhaler, or nicotine nasal spray can help with physical craving. Hypnosis, support groups, and medicines help break the habit of smoking. WHAT THINGS CAN I DO TO MAKE QUITTING EASIER?  Here are some tips to help you quit for good: 1. Pick a date when you will quit smoking completely. Tell all of your friends and family about your plan to quit on that date. 2. Do not try to slowly cut down on the number of cigarettes you are smoking. Pick a quit date and quit smoking completely starting on that day. 3. Throw away all cigarettes.  4. Clean and remove all ashtrays from your home, work, and car. 5. On a card, write down your reasons for quitting. Carry the card with you and read it when you get the urge to smoke. 6. Cleanse your body of nicotine. Drink enough water and fluids to keep your urine clear or pale yellow. Do this after quitting to flush the nicotine from your body. 7. Learn to predict your moods. Do not let a bad situation be your excuse to have a cigarette. Some situations in your life might tempt you into wanting a cigarette. 8. Never have "just one" cigarette. It leads to wanting another and another. Remind yourself of your decision to quit. 9. Change habits associated with smoking. If you smoked while driving or when feeling stressed, try  other activities to replace smoking. Stand up when drinking your coffee. Brush your teeth after eating. Sit in a different chair when you read the paper. Avoid alcohol while trying to quit, and try to drink fewer caffeinated beverages. Alcohol and caffeine may urge you to smoke. 10. Avoid foods and drinks that can trigger a desire to smoke, such as sugary or spicy foods and alcohol. 11. Ask people who smoke not to smoke around you.  12. Have something planned to do right after eating or having a cup of coffee. For example, plan to take a walk or exercise. 13. Try a relaxation exercise to calm you down and decrease your stress. Remember, you may be tense and nervous for the first 2 weeks after you quit, but this will pass. 26. Find new activities to keep your hands busy. Play with a pen, coin, or rubber band. Doodle or draw things on paper. 15. Brush your teeth right after eating. This will help cut down on the craving for the taste of tobacco after meals. You can also try mouthwash.  16. Use oral substitutes in place of cigarettes. Try using lemon drops, carrots, cinnamon sticks, or chewing gum. Keep them handy so they are available when you have the urge to smoke. 17. When you have the urge to smoke, try deep breathing. 40. Designate your home as a nonsmoking area. 19. If you are a heavy smoker, ask your health care provider about a prescription for nicotine chewing gum. It can ease your withdrawal from nicotine. 20. Reward yourself. Set aside the cigarette money you save and buy yourself something nice. 21. Look for support from others. Join a support group or smoking cessation program. Ask someone at home or at work to help you with your plan to quit smoking. 22. Always ask yourself, "Do I need this cigarette or is this just a reflex?" Tell yourself, "Today, I choose not to smoke," or "I do not want to smoke." You are reminding yourself of your decision to quit. 23. Do not replace cigarette smoking  with electronic cigarettes (commonly called e-cigarettes). The safety of e-cigarettes is unknown, and some may contain harmful chemicals. 24. If you relapse, do not give up! Plan ahead and think about what you will do the next time you get the urge to smoke. HOW WILL I FEEL WHEN I QUIT SMOKING? You may have symptoms of withdrawal because your body is used to nicotine (the addictive substance in cigarettes). You may crave cigarettes, be irritable, feel very hungry, cough often, get headaches, or have difficulty concentrating. The withdrawal symptoms are only temporary. They are strongest when you first quit but will go away within 10-14 days. When withdrawal symptoms occur, stay in control. Think about your reasons for quitting. Remind yourself that these are signs that your body is healing and getting used to being without cigarettes. Remember that withdrawal symptoms are easier to treat than the major diseases that smoking can cause.  Even after the withdrawal is over, expect periodic urges to smoke. However, these cravings are generally short lived and will go away whether you smoke or not. Do not smoke! WHAT RESOURCES ARE AVAILABLE TO HELP ME QUIT SMOKING? Your health care provider can direct you to community resources or hospitals for support, which may include:  Group support.  Education.  Hypnosis.  Therapy.   This information is not intended to replace advice given to you by your health care provider. Make sure you discuss any questions you have with your health care provider.   Document Released: 09/12/2004 Document Revised: 01/05/2015 Document Reviewed: 06/02/2013 Elsevier Interactive Patient Education 2016 [place] Facet Blocks Patient Information  Description: The facets are joints in the spine between the vertebrae.  Like any joints in the body, facets can become irritated and painful.  Arthritis can also effect the facets.  By injecting steroids and local anesthetic in and  around these joints, we can temporarily block the nerve supply  to them.  Steroids act directly on irritated nerves and tissues to reduce selling and inflammation which often leads to decreased pain.  Facet blocks may be done anywhere along the spine from the neck to the low back depending upon the location of your pain.   After numbing the skin with local anesthetic (like Novocaine), a small needle is passed onto the facet joints under x-ray guidance.  You may experience a sensation of pressure while this is being done.  The entire block usually lasts about 15-25 minutes.   Conditions which may be treated by facet blocks:   Low back/buttock pain  Neck/shoulder pain  Certain types of headaches  Preparation for the injection:  1. Do not eat any solid food or dairy products within 8 hours of your appointment. 2. You may drink clear liquid up to 3 hours before appointment.  Clear liquids include water, black coffee, juice or soda.  No milk or cream please. 3. You may take your regular medication, including pain medications, with a sip of water before your appointment.  Diabetics should hold regular insulin (if taken separately) and take 1/2 normal NPH dose the morning of the procedure.  Carry some sugar containing items with you to your appointment. 4. A driver must accompany you and be prepared to drive you home after your procedure. 5. Bring all your current medications with you. 6. An IV may be inserted and sedation may be given at the discretion of the physician. 7. A blood pressure cuff, EKG and other monitors will often be applied during the procedure.  Some patients may need to have extra oxygen administered for a short period. 8. You will be asked to provide medical information, including your allergies and medications, prior to the procedure.  We must know immediately if you are taking blood thinners (like Coumadin/Warfarin) or if you are allergic to IV iodine contrast (dye).  We must know  if you could possible be pregnant.  Possible side-effects:   Bleeding from needle site  Infection (rare, may require surgery)  Nerve injury (rare)  Numbness & tingling (temporary)  Difficulty urinating (rare, temporary)  Spinal headache (a headache worse with upright posture)  Light-headedness (temporary)  Pain at injection site (serveral days)  Decreased blood pressure (rare, temporary)  Weakness in arm/leg (temporary)  Pressure sensation in back/neck (temporary)   Call if you experience:   Fever/chills associated with headache or increased back/neck pain  Headache worsened by an upright position  New onset, weakness or numbness of an extremity below the injection site  Hives or difficulty breathing (go to the emergency room)  Inflammation or drainage at the injection site(s)  Severe back/neck pain greater than usual  New symptoms which are concerning to you  Please note:  Although the local anesthetic injected can often make your back or neck feel good for several hours after the injection, the pain will likely return. It takes 3-7 days for steroids to work.  You may not notice any pain relief for at least one week.  If effective, we will often do a series of 2-3 injections spaced 3-6 weeks apart to maximally decrease your pain.  After the initial series, you may be a candidate for a more permanent nerve block of the facets.  If you have any questions, please call #[phone] [facility]

## 2016-06-18 NOTE — Patient Instructions (Addendum)
Smoking Cessation, Tips for Success If you are ready to quit smoking, congratulations! You have chosen to help yourself be healthier. Cigarettes bring nicotine, tar, carbon monoxide, and other irritants into your body. Your lungs, heart, and blood vessels will be able to work better without these poisons. There are many different ways to quit smoking. Nicotine gum, nicotine patches, a nicotine inhaler, or nicotine nasal spray can help with physical craving. Hypnosis, support groups, and medicines help break the habit of smoking. WHAT THINGS CAN I DO TO MAKE QUITTING EASIER?  Here are some tips to help you quit for good: 1. Pick a date when you will quit smoking completely. Tell all of your friends and family about your plan to quit on that date. 2. Do not try to slowly cut down on the number of cigarettes you are smoking. Pick a quit date and quit smoking completely starting on that day. 3. Throw away all cigarettes.  4. Clean and remove all ashtrays from your home, work, and car. 5. On a card, write down your reasons for quitting. Carry the card with you and read it when you get the urge to smoke. 6. Cleanse your body of nicotine. Drink enough water and fluids to keep your urine clear or pale yellow. Do this after quitting to flush the nicotine from your body. 7. Learn to predict your moods. Do not let a bad situation be your excuse to have a cigarette. Some situations in your life might tempt you into wanting a cigarette. 8. Never have "just one" cigarette. It leads to wanting another and another. Remind yourself of your decision to quit. 9. Change habits associated with smoking. If you smoked while driving or when feeling stressed, try other activities to replace smoking. Stand up when drinking your coffee. Brush your teeth after eating. Sit in a different chair when you read the paper. Avoid alcohol while trying to quit, and try to drink fewer caffeinated beverages. Alcohol and caffeine may urge you  to smoke. 10. Avoid foods and drinks that can trigger a desire to smoke, such as sugary or spicy foods and alcohol. 11. Ask people who smoke not to smoke around you. 12. Have something planned to do right after eating or having a cup of coffee. For example, plan to take a walk or exercise. 13. Try a relaxation exercise to calm you down and decrease your stress. Remember, you may be tense and nervous for the first 2 weeks after you quit, but this will pass. 14. Find new activities to keep your hands busy. Play with a pen, coin, or rubber band. Doodle or draw things on paper. 15. Brush your teeth right after eating. This will help cut down on the craving for the taste of tobacco after meals. You can also try mouthwash.  16. Use oral substitutes in place of cigarettes. Try using lemon drops, carrots, cinnamon sticks, or chewing gum. Keep them handy so they are available when you have the urge to smoke. 17. When you have the urge to smoke, try deep breathing. 18. Designate your home as a nonsmoking area. 19. If you are a heavy smoker, ask your health care provider about a prescription for nicotine chewing gum. It can ease your withdrawal from nicotine. 20. Reward yourself. Set aside the cigarette money you save and buy yourself something nice. 21. Look for support from others. Join a support group or smoking cessation program. Ask someone at home or at work to help you with your plan   to quit smoking. 22. Always ask yourself, "Do I need this cigarette or is this just a reflex?" Tell yourself, "Today, I choose not to smoke," or "I do not want to smoke." You are reminding yourself of your decision to quit. 23. Do not replace cigarette smoking with electronic cigarettes (commonly called e-cigarettes). The safety of e-cigarettes is unknown, and some may contain harmful chemicals. 24. If you relapse, do not give up! Plan ahead and think about what you will do the next time you get the urge to smoke. HOW WILL  I FEEL WHEN I QUIT SMOKING? You may have symptoms of withdrawal because your body is used to nicotine (the addictive substance in cigarettes). You may crave cigarettes, be irritable, feel very hungry, cough often, get headaches, or have difficulty concentrating. The withdrawal symptoms are only temporary. They are strongest when you first quit but will go away within 10-14 days. When withdrawal symptoms occur, stay in control. Think about your reasons for quitting. Remind yourself that these are signs that your body is healing and getting used to being without cigarettes. Remember that withdrawal symptoms are easier to treat than the major diseases that smoking can cause.  Even after the withdrawal is over, expect periodic urges to smoke. However, these cravings are generally short lived and will go away whether you smoke or not. Do not smoke! WHAT RESOURCES ARE AVAILABLE TO HELP ME QUIT SMOKING? Your health care provider can direct you to community resources or hospitals for support, which may include:  Group support.  Education.  Hypnosis.  Therapy.   This information is not intended to replace advice given to you by your health care provider. Make sure you discuss any questions you have with your health care provider.   Document Released: 09/12/2004 Document Revised: 01/05/2015 Document Reviewed: 06/02/2013 Elsevier Interactive Patient Education 2016 [employer] Facet Blocks Patient Information  Description: The facets are joints in the spine between the vertebrae.  Like any joints in the body, facets can become irritated and painful.  Arthritis can also effect the facets.  By injecting steroids and local anesthetic in and around these joints, we can temporarily block the nerve supply to them.  Steroids act directly on irritated nerves and tissues to reduce selling and inflammation which often leads to decreased pain.  Facet blocks may be done anywhere along the spine from the neck to the  low back depending upon the location of your pain.   After numbing the skin with local anesthetic (like Novocaine), a small needle is passed onto the facet joints under x-ray guidance.  You may experience a sensation of pressure while this is being done.  The entire block usually lasts about 15-25 minutes.   Conditions which may be treated by facet blocks:   Low back/buttock pain  Neck/shoulder pain  Certain types of headaches  Preparation for the injection:  1. Do not eat any solid food or dairy products within 8 hours of your appointment. 2. You may drink clear liquid up to 3 hours before appointment.  Clear liquids include water, black coffee, juice or soda.  No milk or cream please. 3. You may take your regular medication, including pain medications, with a sip of water before your appointment.  Diabetics should hold regular insulin (if taken separately) and take 1/2 normal NPH dose the morning of the procedure.  Carry some sugar containing items with you to your appointment. 4. A driver must accompany you and be prepared to drive you home   after your procedure. 5. Bring all your current medications with you. 6. An IV may be inserted and sedation may be given at the discretion of the physician. 7. A blood pressure cuff, EKG and other monitors will often be applied during the procedure.  Some patients may need to have extra oxygen administered for a short period. 8. You will be asked to provide medical information, including your allergies and medications, prior to the procedure.  We must know immediately if you are taking blood thinners (like Coumadin/Warfarin) or if you are allergic to IV iodine contrast (dye).  We must know if you could possible be pregnant.  Possible side-effects:   Bleeding from needle site  Infection (rare, may require surgery)  Nerve injury (rare)  Numbness & tingling (temporary)  Difficulty urinating (rare, temporary)  Spinal headache (a headache worse  with upright posture)  Light-headedness (temporary)  Pain at injection site (serveral days)  Decreased blood pressure (rare, temporary)  Weakness in arm/leg (temporary)  Pressure sensation in back/neck (temporary)   Call if you experience:   Fever/chills associated with headache or increased back/neck pain  Headache worsened by an upright position  New onset, weakness or numbness of an extremity below the injection site  Hives or difficulty breathing (go to the emergency room)  Inflammation or drainage at the injection site(s)  Severe back/neck pain greater than usual  New symptoms which are concerning to you  Please note:  Although the local anesthetic injected can often make your back or neck feel good for several hours after the injection, the pain will likely return. It takes 3-7 days for steroids to work.  You may not notice any pain relief for at least one week.  If effective, we will often do a series of 2-3 injections spaced 3-6 weeks apart to maximally decrease your pain.  After the initial series, you may be a candidate for a more permanent nerve block of the facets.  If you have any questions, please call [phone] [facility] 

## 2016-06-18 NOTE — Progress Notes (Signed)
Safety precautions to be maintained throughout the outpatient stay will include: orient to surroundings, keep bed in low position, maintain call bell within reach at all times, provide assistance with transfer out of bed and ambulation.  

## 2016-07-24 ENCOUNTER — Ambulatory Visit: Payer: 59 | Admitting: Pain Medicine

## 2016-07-29 ENCOUNTER — Ambulatory Visit: Payer: 59 | Attending: Pain Medicine | Admitting: Pain Medicine

## 2016-07-29 ENCOUNTER — Encounter: Payer: Self-pay | Admitting: Pain Medicine

## 2016-07-29 VITALS — BP 128/47 | HR 75 | Temp 98.4°F | Resp 20 | Ht 74.0 in | Wt 237.0 lb

## 2016-07-29 DIAGNOSIS — L97509 Non-pressure chronic ulcer of other part of unspecified foot with unspecified severity: Secondary | ICD-10-CM | POA: Diagnosis not present

## 2016-07-29 DIAGNOSIS — E1169 Type 2 diabetes mellitus with other specified complication: Secondary | ICD-10-CM | POA: Diagnosis not present

## 2016-07-29 DIAGNOSIS — R7982 Elevated C-reactive protein (CRP): Secondary | ICD-10-CM | POA: Insufficient documentation

## 2016-07-29 DIAGNOSIS — B182 Chronic viral hepatitis C: Secondary | ICD-10-CM | POA: Diagnosis not present

## 2016-07-29 DIAGNOSIS — M47816 Spondylosis without myelopathy or radiculopathy, lumbar region: Secondary | ICD-10-CM

## 2016-07-29 DIAGNOSIS — E1140 Type 2 diabetes mellitus with diabetic neuropathy, unspecified: Secondary | ICD-10-CM | POA: Diagnosis not present

## 2016-07-29 DIAGNOSIS — M542 Cervicalgia: Secondary | ICD-10-CM | POA: Insufficient documentation

## 2016-07-29 DIAGNOSIS — I10 Essential (primary) hypertension: Secondary | ICD-10-CM | POA: Diagnosis not present

## 2016-07-29 DIAGNOSIS — M4312 Spondylolisthesis, cervical region: Secondary | ICD-10-CM | POA: Diagnosis not present

## 2016-07-29 DIAGNOSIS — I340 Nonrheumatic mitral (valve) insufficiency: Secondary | ICD-10-CM | POA: Insufficient documentation

## 2016-07-29 DIAGNOSIS — M545 Low back pain: Secondary | ICD-10-CM | POA: Diagnosis present

## 2016-07-29 DIAGNOSIS — E785 Hyperlipidemia, unspecified: Secondary | ICD-10-CM | POA: Diagnosis not present

## 2016-07-29 DIAGNOSIS — M869 Osteomyelitis, unspecified: Secondary | ICD-10-CM | POA: Diagnosis not present

## 2016-07-29 DIAGNOSIS — E11621 Type 2 diabetes mellitus with foot ulcer: Secondary | ICD-10-CM | POA: Insufficient documentation

## 2016-07-29 DIAGNOSIS — G8929 Other chronic pain: Secondary | ICD-10-CM | POA: Insufficient documentation

## 2016-07-29 DIAGNOSIS — Z79891 Long term (current) use of opiate analgesic: Secondary | ICD-10-CM | POA: Insufficient documentation

## 2016-07-29 DIAGNOSIS — E669 Obesity, unspecified: Secondary | ICD-10-CM | POA: Insufficient documentation

## 2016-07-29 DIAGNOSIS — M79605 Pain in left leg: Secondary | ICD-10-CM | POA: Diagnosis present

## 2016-07-29 DIAGNOSIS — M47812 Spondylosis without myelopathy or radiculopathy, cervical region: Secondary | ICD-10-CM | POA: Insufficient documentation

## 2016-07-29 DIAGNOSIS — N529 Male erectile dysfunction, unspecified: Secondary | ICD-10-CM | POA: Insufficient documentation

## 2016-07-29 DIAGNOSIS — R011 Cardiac murmur, unspecified: Secondary | ICD-10-CM | POA: Diagnosis not present

## 2016-07-29 DIAGNOSIS — F17210 Nicotine dependence, cigarettes, uncomplicated: Secondary | ICD-10-CM | POA: Insufficient documentation

## 2016-07-29 DIAGNOSIS — M79604 Pain in right leg: Secondary | ICD-10-CM | POA: Diagnosis present

## 2016-07-29 DIAGNOSIS — M961 Postlaminectomy syndrome, not elsewhere classified: Secondary | ICD-10-CM | POA: Insufficient documentation

## 2016-07-29 MED ORDER — TRIAMCINOLONE ACETONIDE 40 MG/ML IJ SUSP
40.0000 mg | Freq: Once | INTRAMUSCULAR | Status: AC
  Administered 2016-07-29: 40 mg
  Filled 2016-07-29: qty 1

## 2016-07-29 MED ORDER — LIDOCAINE HCL (PF) 1 % IJ SOLN
10.0000 mL | Freq: Once | INTRAMUSCULAR | Status: AC
  Administered 2016-07-29: 10 mL

## 2016-07-29 MED ORDER — ROPIVACAINE HCL 2 MG/ML IJ SOLN
9.0000 mL | Freq: Once | INTRAMUSCULAR | Status: AC
  Administered 2016-07-29: 9 mL
  Filled 2016-07-29: qty 10

## 2016-07-29 NOTE — Progress Notes (Signed)
Patient's Name: Willie Krueger  Patient type: Established  MRN: [id number]  Service setting: Ambulatory outpatient  DOB: Oct 08, 1956  Location: [facility]  DOS: 07/29/2016  Primary Care Physician: [facility]  Note by: Kathlen Brunswick. Dossie Arbour, M.D, DABA, DABAPM, DABPM, Milagros Evener, FIPP  Referring Physician: Milinda Pointer, MD  Specialty: Board-Certified Interventional Pain Management  Last Visit to Pain Management: 06/18/2016   Primary Reason(s) for Visit: Interventional Pain Management Treatment. CC: Leg Pain (bilateral); Foot Pain (bilateral); Arm Pain (bilateral); and Back Pain (lower)  Facet syndrome, lumbar [M54.5]   Procedure:  Anesthesia, Analgesia, Anxiolysis:  Type: Diagnostic Medial Branch Facet Block Region: Lumbar Level: L2, L3, L4, L5, & S1 Medial Branch Level(s) Laterality: Bilateral  Indications: 1. Lumbar facet syndrome (Location of Primary Source of Pain) (Bilateral) (R>L)   2. Chronic low back pain (Location of Primary Source of Pain) (Midline pain) (R>L)   3. Lumbar spondylosis, unspecified spinal osteoarthritis     Pre-procedure Pain Score: 5/10 Reported level of pain is compatible with clinical observations Post-procedure Pain Score: 0-No pain  Type: Local Anesthesia Local Anesthetic: Lidocaine 1% Route: Infiltration ([place]/IM) IV Access: Declined Sedation: Declined  Indication(s): Analgesia       Pre-Procedure Assessment:  [name] is a 61 y.o. year old, male patient, seen today for interventional treatment. He has Toe ulcer ([place]); Cervical spinal cord compression ([place]); Cardiac murmur; Chronic hepatitis C virus infection ([place]); HLD (hyperlipidemia); Benign hypertension; ED (erectile dysfunction) of organic origin; MI (mitral incompetence); Adiposity; Current smoker; History of decompression of median nerve; Chronic pain; Long term current use of opiate analgesic; Long term prescription opiate use; Opiate use (112.5 MME/Day); Opiate  dependence ([place]); Encounter for therapeutic drug level monitoring; Chronic neck pain (Right side); Cervical spondylosis; Cervical post-laminectomy syndrome; Chronic low back pain (Location of Primary Source of Pain) (Midline pain) (R>L); Chronic cervical radicular pain (Bilateral) (R>L); Chronic lumbar radicular pain (Bilateral) (R>L); Substance use disorder Risk: LOW; Failed back surgical syndrome; Chronic lower extremity pain (Location of Secondary source of pain) (Bilateral) (R>L); History of carpal tunnel release of both wrists; Grade I anterolisthesis of C7 on T1; Encounter for chronic pain management; Elevated C-reactive protein (CRP); Diabetic osteomyelitis ([place]); Neuropathy ([place]); Obesity; Neurogenic pain; Type 2 diabetes mellitus with foot ulcer (CODE) ([place]); Diabetic peripheral neuropathy ([place]); Chronic shoulder pain (Location of Tertiary source of pain) (Bilateral) (R>L); Lumbar facet syndrome (Location of Primary Source of Pain) (Bilateral) (R>L); Abnormal MRI, lumbar spine (05/19/2016); and Lumbar spondylosis on his problem list.. His primarily concern today is the Leg Pain (bilateral); Foot Pain (bilateral); Arm Pain (bilateral); and Back Pain (lower)   Pain Type: Chronic pain Pain Location: Leg (arms, feet, hands, back) Pain Orientation: Left, Right, Lower Pain Descriptors / Indicators: Aching, Burning Pain Frequency: Constant  Date of Last Visit: 06/18/16 Service Provided on Last Visit: Med Refill  Coagulation Parameters Lab Results  Component Value Date   PLT 177 03/04/2016    Verification of the correct person, correct site (including marking of site), and correct procedure were performed and confirmed by the patient.  Consent: Secured. Under the influence of no sedatives a written informed consent was obtained, after having provided information on the risks and possible complications. To fulfill our ethical and legal obligations, as recommended by the American Medical  Association's Code of Ethics, we have provided information to the patient about our clinical impression; the nature and purpose of the treatment or procedure; the risks, benefits, and possible complications of the intervention;  alternatives; the risk(s) and benefit(s) of the alternative treatment(s) or procedure(s); and the risk(s) and benefit(s) of doing nothing. The patient was provided information about the risks and possible complications associated with the procedure. These include, but are not limited to, failure to achieve desired goals, infection, bleeding, organ or nerve damage, allergic reactions, paralysis, and death. In the case of spinal procedures these may include, but are not limited to, failure to achieve desired goals, infection, bleeding, organ or nerve damage, allergic reactions, paralysis, and death. In addition, the patient was informed that Medicine is not an exact science; therefore, there is also the possibility of unforeseen risks and possible complications that may result in a catastrophic outcome. The patient indicated having understood very clearly. We have given the patient no guarantees and we have made no promises. Enough time was given to the patient to ask questions, all of which were answered to the patient's satisfaction.  Consent Attestation: I, the ordering provider, attest that I have discussed with the patient the benefits, risks, side-effects, alternatives, likelihood of achieving goals, and potential problems during recovery for the procedure that I have provided informed consent.  Pre-Procedure Preparation: Safety Precautions: Allergies reviewed. Appropriate site, procedure, and patient were confirmed by following the Joint Commission's Universal Protocol (UP.01.01.01), in the form of a "Time Out". The patient was asked to confirm marked site and procedure, before commencing. The patient was asked about blood thinners, or active infections, both of which were denied.  Patient was assessed for positional comfort and all pressure points were checked before starting procedure. Allergies: He is allergic to cleocin [clindamycin] and sulfa antibiotics.. Infection Control Precautions: Sterile technique used. Standard Universal Precautions were taken as recommended by the Department of [facility] for Disease Control and Prevention (CDC). Standard pre-surgical skin prep was conducted. Respiratory hygiene and cough etiquette was practiced. Hand hygiene observed. Safe injection practices and needle disposal techniques followed. SDV (single dose vial) medications used. Medications properly checked for expiration dates and contaminants. Personal protective equipment (PPE) used: Sterile Radiation-resistant gloves. Monitoring:  As per clinic protocol. Vitals:   07/29/16 1451 07/29/16 1456 07/29/16 1501 07/29/16 1504  BP: (!) 119/51 (!) 128/45 (!) 125/46 (!) 128/47  Pulse: 75 80 75 75  Resp: 20 20 20 20   Temp:      SpO2: 100% 97% 96% 98%  Weight:      Height:      Calculated BMI: Body mass index is 30.43 kg/m.  Description of Procedure Process:   Time-out: "Time-out" completed before starting procedure, as per protocol. Position: Prone Target Area: For Lumbar Facet blocks, the target is the groove formed by the junction of the transverse process and superior articular process. For the L5 dorsal ramus, the target is the notch between superior articular process and sacral ala. For the S1 dorsal ramus, the target is the superior and lateral edge of the posterior S1 Sacral foramen. Approach: Paramedial approach. Area Prepped: Entire Posterior Lumbosacral Region Prepping solution: ChloraPrep (2% chlorhexidine gluconate and 70% isopropyl alcohol) Safety Precautions: Aspiration looking for blood return was conducted prior to all injections. At no point did we inject any substances, as a needle was being advanced. No attempts were made at seeking any paresthesias. Safe  injection practices and needle disposal techniques used. Medications properly checked for expiration dates. SDV (single dose vial) medications used.   Description of the Procedure: Protocol guidelines were followed. The patient was placed in position over the fluoroscopy table. The target area was identified and  the area prepped in the usual manner. Skin desensitized using vapocoolant spray. Skin & deeper tissues infiltrated with local anesthetic. Appropriate amount of time allowed to pass for local anesthetics to take effect. The procedure needle was introduced through the skin, ipsilateral to the reported pain, and advanced to the target area. Employing the "Medial Branch Technique", the needles were advanced to the angle made by the superior and medial portion of the transverse process, and the lateral and inferior portion of the superior articulating process of the targeted vertebral bodies. This area is known as "Burton's Eye" or the "Eye of the [place] Dog". A procedure needle was introduced through the skin, and this time advanced to the angle made by the superior and medial border of the sacral ala, and the lateral border of the S1 vertebral body. This last needle was later repositioned at the superior and lateral border of the posterior S1 foramen. Negative aspiration confirmed. Solution injected in intermittent fashion, asking for systemic symptoms every 0.5cc of injectate. The needles were then removed and the area cleansed, making sure to leave some of the prepping solution back to take advantage of its long term bactericidal properties. EBL: None Materials & Medications Used:  Needle(s) Used: 22g - 3.5" Spinal Needle(s)  Imaging Guidance:   Type of Imaging Technique: Fluoroscopy Guidance (Spinal) Indication(s): Assistance in needle guidance and placement for procedures requiring needle placement in or near specific anatomical locations not easily accessible without such assistance. Exposure  Time: Please see nurses notes. Contrast: None required. Fluoroscopic Guidance: I was personally present in the fluoroscopy suite, where the patient was placed in position for the procedure, over the fluoroscopy-compatible table. Fluoroscopy was manipulated, using "Tunnel Vision Technique", to obtain the best possible view of the target area, on the affected side. Parallax error was corrected before commencing the procedure. A "direction-depth-direction" technique was used to introduce the needle under continuous pulsed fluoroscopic guidance. Once the target was reached, antero-posterior, oblique, and lateral fluoroscopic projection views were taken to confirm needle placement in all planes. Permanently recorded images stored by scanning into EMR. Interpretation: Intraoperative imaging interpretation by performing Physician. Adequate needle placement confirmed. Adequate needle placement confirmed in AP, lateral, & Oblique Views. No contrast injected.  Antibiotic Prophylaxis:  Indication(s): No indications identified. Type:  Antibiotics Given (last 72 hours)    None       Post-operative Assessment:   Complications: No immediate post-treatment complications were observed. Disposition: Return to clinic for follow-up evaluation. The patient tolerated the entire procedure well. A repeat set of vitals were taken after the procedure and the patient was kept under observation following institutional policy, for this procedure. Post-procedural neurological assessment was performed, showing return to baseline, prior to discharge. The patient was discharged home, once institutional criteria were met. The patient was provided with post-procedure discharge instructions, including a section on how to identify potential problems. Should any problems arise concerning this procedure, the patient was given instructions to immediately contact us, at any time, without hesitation. In any case, we plan to contact the  patient by telephone for a follow-up status report regarding this interventional procedure. Comments:  No additional relevant information.  Plan of Care   Problem List Items Addressed This Visit      High   Chronic low back pain (Location of Primary Source of Pain) (Midline pain) (R>L) (Chronic)   Relevant Medications   ibuprofen (ADVIL,MOTRIN) 800 MG tablet   triamcinolone acetonide (KENALOG-40) injection 40 mg (Completed)   Lumbar facet syndrome (  Location of Primary Source of Pain) (Bilateral) (R>L) - Primary (Chronic)   Relevant Medications   ibuprofen (ADVIL,MOTRIN) 800 MG tablet   triamcinolone acetonide (KENALOG-40) injection 40 mg (Completed)   lidocaine (PF) (XYLOCAINE) 1 % injection 10 mL (Completed)   ropivacaine (PF) 2 mg/ml (0.2%) (NAROPIN) epidural 9 mL (Completed)   Lumbar spondylosis (Chronic)   Relevant Medications   ibuprofen (ADVIL,MOTRIN) 800 MG tablet   triamcinolone acetonide (KENALOG-40) injection 40 mg (Completed)    Other Visit Diagnoses   None.      Pharmacotherapy (Medications Ordered): Meds ordered this encounter  Medications  . triamcinolone acetonide (KENALOG-40) injection 40 mg  . lidocaine (PF) (XYLOCAINE) 1 % injection 10 mL  . ropivacaine (PF) 2 mg/ml (0.2%) (NAROPIN) epidural 9 mL    Lab-work & Procedure Ordered: No orders of the defined types were placed in this encounter.   New medications started at this visit: New Prescriptions   No medications on file    Medications administered during this visit: We administered triamcinolone acetonide, lidocaine (PF), and ropivacaine (PF) 2 mg/ml (0.2%).  Requested PM Follow-up: Return in about 2 weeks (around 08/12/2016) for Post-procedure FU (2-wks).  Future Appointments Date Time Provider [place]  08/11/2016 9:40 AM Milinda Pointer, MD [facility] None    Primary Care Physician: [facility] Location: [facility] Note by: Kathlen Brunswick.  Dossie Arbour, M.D, DABA, DABAPM, DABPM, DABIPP, FIPP   Illustration of the posterior view of the lumbar spine and the posterior neural structures. Laminae of L2 through S1 are labeled. DPRL5, dorsal primary ramus of L5; DPRS1, dorsal primary ramus of S1; DPR3, dorsal primary ramus of L3; FJ, facet (zygapophyseal) joint L3-L4; I, inferior articular process of L4; LB1, lateral branch of dorsal primary ramus of L1; IAB, inferior articular branches from L3 medial branch (supplies L4-L5 facet joint); IBP, intermediate branch plexus; MB3, medial branch of dorsal primary ramus of L3; NR3, third lumbar nerve root; S, superior articular process of L5; SAB, superior articular branches from L4 (supplies L4-5 facet joint also); TP3, transverse process of L3.  Disclaimer:  Medicine is not an Chief Strategy Officer. The only guarantee in medicine is that nothing is guaranteed. It is important to note that the decision to proceed with this intervention was based on the information collected from the patient. The Data and conclusions were drawn from the patient's questionnaire, the interview, and the physical examination. Because the information was provided in large part by the patient, it cannot be guaranteed that it has not been purposely or unconsciously manipulated. Every effort has been made to obtain as much relevant data as possible for this evaluation. It is important to note that the conclusions that lead to this procedure are derived in large part from the available data. Always take into account that the treatment will also be dependent on availability of resources and existing treatment guidelines, considered by other Pain Management Practitioners as being common knowledge and practice, at the time of the intervention. For Medico-Legal purposes, it is also important to point out that variation in procedural techniques and pharmacological choices are the acceptable norm. The indications, contraindications, technique, and  results of the above procedure should only be interpreted and judged by a Board-Certified Interventional Pain Specialist with extensive familiarity and expertise in the same exact procedure and technique. Attempts at providing opinions without similar or greater experience and expertise than that of the treating physician will be considered as inappropriate and unethical, and shall result in a formal  complaint to the state medical board and applicable specialty societies.

## 2016-07-29 NOTE — Progress Notes (Signed)
Safety precautions to be maintained throughout the outpatient stay will include: orient to surroundings, keep bed in low position, maintain call bell within reach at all times, provide assistance with transfer out of bed and ambulation.  Patient states he does not want IV sedation.

## 2016-07-29 NOTE — Patient Instructions (Addendum)
Smoking Cessation, Tips for Success If you are ready to quit smoking, congratulations! You have chosen to help yourself be healthier. Cigarettes bring nicotine, tar, carbon monoxide, and other irritants into your body. Your lungs, heart, and blood vessels will be able to work better without these poisons. There are many different ways to quit smoking. Nicotine gum, nicotine patches, a nicotine inhaler, or nicotine nasal spray can help with physical craving. Hypnosis, support groups, and medicines help break the habit of smoking. WHAT THINGS CAN I DO TO MAKE QUITTING EASIER?  Here are some tips to help you quit for good:  Pick a date when you will quit smoking completely. Tell all of your friends and family about your plan to quit on that date.  Do not try to slowly cut down on the number of cigarettes you are smoking. Pick a quit date and quit smoking completely starting on that day.  Throw away all cigarettes.   Clean and remove all ashtrays from your home, work, and car.  On a card, write down your reasons for quitting. Carry the card with you and read it when you get the urge to smoke.  Cleanse your body of nicotine. Drink enough water and fluids to keep your urine clear or pale yellow. Do this after quitting to flush the nicotine from your body.  Learn to predict your moods. Do not let a bad situation be your excuse to have a cigarette. Some situations in your life might tempt you into wanting a cigarette.  Never have "just one" cigarette. It leads to wanting another and another. Remind yourself of your decision to quit.  Change habits associated with smoking. If you smoked while driving or when feeling stressed, try other activities to replace smoking. Stand up when drinking your coffee. Brush your teeth after eating. Sit in a different chair when you read the paper. Avoid alcohol while trying to quit, and try to drink fewer caffeinated beverages. Alcohol and caffeine may urge you to  smoke.  Avoid foods and drinks that can trigger a desire to smoke, such as sugary or spicy foods and alcohol.  Ask people who smoke not to smoke around you.  Have something planned to do right after eating or having a cup of coffee. For example, plan to take a walk or exercise.  Try a relaxation exercise to calm you down and decrease your stress. Remember, you may be tense and nervous for the first 2 weeks after you quit, but this will pass.  Find new activities to keep your hands busy. Play with a pen, coin, or rubber band. Doodle or draw things on paper.  Brush your teeth right after eating. This will help cut down on the craving for the taste of tobacco after meals. You can also try mouthwash.   Use oral substitutes in place of cigarettes. Try using lemon drops, carrots, cinnamon sticks, or chewing gum. Keep them handy so they are available when you have the urge to smoke.  When you have the urge to smoke, try deep breathing.  Designate your home as a nonsmoking area.  If you are a heavy smoker, ask your health care provider about a prescription for nicotine chewing gum. It can ease your withdrawal from nicotine.  Reward yourself. Set aside the cigarette money you save and buy yourself something nice.  Look for support from others. Join a support group or smoking cessation program. Ask someone at home or at work to help you with your plan   to quit smoking.  Always ask yourself, "Do I need this cigarette or is this just a reflex?" Tell yourself, "Today, I choose not to smoke," or "I do not want to smoke." You are reminding yourself of your decision to quit.  Do not replace cigarette smoking with electronic cigarettes (commonly called e-cigarettes). The safety of e-cigarettes is unknown, and some may contain harmful chemicals.  If you relapse, do not give up! Plan ahead and think about what you will do the next time you get the urge to smoke. HOW WILL I FEEL WHEN I QUIT SMOKING? You  may have symptoms of withdrawal because your body is used to nicotine (the addictive substance in cigarettes). You may crave cigarettes, be irritable, feel very hungry, cough often, get headaches, or have difficulty concentrating. The withdrawal symptoms are only temporary. They are strongest when you first quit but will go away within 10-14 days. When withdrawal symptoms occur, stay in control. Think about your reasons for quitting. Remind yourself that these are signs that your body is healing and getting used to being without cigarettes. Remember that withdrawal symptoms are easier to treat than the major diseases that smoking can cause.  Even after the withdrawal is over, expect periodic urges to smoke. However, these cravings are generally short lived and will go away whether you smoke or not. Do not smoke! WHAT RESOURCES ARE AVAILABLE TO HELP ME QUIT SMOKING? Your health care provider can direct you to community resources or hospitals for support, which may include:  Group support.  Education.  Hypnosis.  Therapy.   This information is not intended to replace advice given to you by your health care provider. Make sure you discuss any questions you have with your health care provider.   Pain Management Discharge Instructions  General Discharge Instructions :  If you need to reach your doctor call: Monday-Friday 8:00 am - 4:00 pm at [phone] or toll free [phone].  After clinic hours [phone] to have operator reach doctor.  Bring all of your medication bottles to all your appointments in the pain clinic.  To cancel or reschedule your appointment with Pain Management please remember to call 24 hours in advance to avoid a fee.  Refer to the educational materials which you have been given on: General Risks, I had my Procedure. Discharge Instructions, Post Sedation.  Post Procedure Instructions:  The drugs you were given will stay in your system until tomorrow, so for the  next 24 hours you should not drive, make any legal decisions or drink any alcoholic beverages.  You may eat anything you prefer, but it is better to start with liquids then soups and crackers, and gradually work up to solid foods.  Please notify your doctor immediately if you have any unusual bleeding, trouble breathing or pain that is not related to your normal pain.  Depending on the type of procedure that was done, some parts of your body may feel week and/or numb.  This usually clears up by tonight or the next day.  Walk with the use of an assistive device or accompanied by an adult for the 24 hours.  You may use ice on the affected area for the first 24 hours.  Put ice in a Ziploc bag and cover with a towel and place against area 15 minutes on 15 minutes off.  You may switch to heat after 24 hours.Pain Management Discharge Instructions  General Discharge Instructions :  If you need to reach your doctor call: Monday-Friday  8:00 am - 4:00 pm at [phone] or toll free [phone].  After clinic hours [phone] to have operator reach doctor.  Bring all of your medication bottles to all your appointments in the pain clinic.  To cancel or reschedule your appointment with Pain Management please remember to call 24 hours in advance to avoid a fee.  Refer to the educational materials which you have been given on: General Risks, I had my Procedure. Discharge Instructions, Post Sedation.  Post Procedure Instructions:  The drugs you were given will stay in your system until tomorrow, so for the next 24 hours you should not drive, make any legal decisions or drink any alcoholic beverages.  You may eat anything you prefer, but it is better to start with liquids then soups and crackers, and gradually work up to solid foods.  Please notify your doctor immediately if you have any unusual bleeding, trouble breathing or pain that is not related to your normal pain.  Depending on the type of  procedure that was done, some parts of your body may feel week and/or numb.  This usually clears up by tonight or the next day.  Walk with the use of an assistive device or accompanied by an adult for the 24 hours.  You may use ice on the affected area for the first 24 hours.  Put ice in a Ziploc bag and cover with a towel and place against area 15 minutes on 15 minutes off.  You may switch to heat after 24 hours.

## 2016-07-30 ENCOUNTER — Telehealth: Payer: Self-pay | Admitting: *Deleted

## 2016-07-30 NOTE — Telephone Encounter (Signed)
Attempted to reach patient, no voicemail capability.  °

## 2016-08-11 ENCOUNTER — Ambulatory Visit: Payer: 59 | Attending: Pain Medicine | Admitting: Pain Medicine

## 2016-08-11 ENCOUNTER — Encounter: Payer: Self-pay | Admitting: Pain Medicine

## 2016-08-11 VITALS — BP 136/81 | HR 73 | Temp 98.8°F | Resp 17 | Ht 74.0 in | Wt 237.0 lb

## 2016-08-11 DIAGNOSIS — M25512 Pain in left shoulder: Secondary | ICD-10-CM | POA: Diagnosis not present

## 2016-08-11 DIAGNOSIS — M792 Neuralgia and neuritis, unspecified: Secondary | ICD-10-CM

## 2016-08-11 DIAGNOSIS — R011 Cardiac murmur, unspecified: Secondary | ICD-10-CM | POA: Diagnosis not present

## 2016-08-11 DIAGNOSIS — M545 Low back pain: Secondary | ICD-10-CM | POA: Insufficient documentation

## 2016-08-11 DIAGNOSIS — M4722 Other spondylosis with radiculopathy, cervical region: Secondary | ICD-10-CM | POA: Insufficient documentation

## 2016-08-11 DIAGNOSIS — B182 Chronic viral hepatitis C: Secondary | ICD-10-CM | POA: Insufficient documentation

## 2016-08-11 DIAGNOSIS — M79606 Pain in leg, unspecified: Secondary | ICD-10-CM | POA: Insufficient documentation

## 2016-08-11 DIAGNOSIS — I10 Essential (primary) hypertension: Secondary | ICD-10-CM | POA: Insufficient documentation

## 2016-08-11 DIAGNOSIS — M961 Postlaminectomy syndrome, not elsewhere classified: Secondary | ICD-10-CM | POA: Diagnosis not present

## 2016-08-11 DIAGNOSIS — F1190 Opioid use, unspecified, uncomplicated: Secondary | ICD-10-CM

## 2016-08-11 DIAGNOSIS — I340 Nonrheumatic mitral (valve) insufficiency: Secondary | ICD-10-CM | POA: Diagnosis not present

## 2016-08-11 DIAGNOSIS — E11621 Type 2 diabetes mellitus with foot ulcer: Secondary | ICD-10-CM | POA: Insufficient documentation

## 2016-08-11 DIAGNOSIS — E785 Hyperlipidemia, unspecified: Secondary | ICD-10-CM | POA: Diagnosis not present

## 2016-08-11 DIAGNOSIS — Z79891 Long term (current) use of opiate analgesic: Secondary | ICD-10-CM

## 2016-08-11 DIAGNOSIS — E1169 Type 2 diabetes mellitus with other specified complication: Secondary | ICD-10-CM | POA: Insufficient documentation

## 2016-08-11 DIAGNOSIS — M869 Osteomyelitis, unspecified: Secondary | ICD-10-CM | POA: Diagnosis not present

## 2016-08-11 DIAGNOSIS — F17210 Nicotine dependence, cigarettes, uncomplicated: Secondary | ICD-10-CM | POA: Insufficient documentation

## 2016-08-11 DIAGNOSIS — M4726 Other spondylosis with radiculopathy, lumbar region: Secondary | ICD-10-CM | POA: Diagnosis not present

## 2016-08-11 DIAGNOSIS — G8929 Other chronic pain: Secondary | ICD-10-CM | POA: Insufficient documentation

## 2016-08-11 DIAGNOSIS — M25511 Pain in right shoulder: Secondary | ICD-10-CM | POA: Diagnosis not present

## 2016-08-11 DIAGNOSIS — M4316 Spondylolisthesis, lumbar region: Secondary | ICD-10-CM | POA: Diagnosis not present

## 2016-08-11 DIAGNOSIS — N529 Male erectile dysfunction, unspecified: Secondary | ICD-10-CM | POA: Diagnosis not present

## 2016-08-11 DIAGNOSIS — E669 Obesity, unspecified: Secondary | ICD-10-CM | POA: Diagnosis not present

## 2016-08-11 DIAGNOSIS — L97509 Non-pressure chronic ulcer of other part of unspecified foot with unspecified severity: Secondary | ICD-10-CM | POA: Insufficient documentation

## 2016-08-11 DIAGNOSIS — M47816 Spondylosis without myelopathy or radiculopathy, lumbar region: Secondary | ICD-10-CM

## 2016-08-11 MED ORDER — OXYCODONE HCL 15 MG PO TABS: 15.0000 mg | ORAL_TABLET | Freq: Every day | ORAL | 0 refills | Status: DC | PRN

## 2016-08-11 MED ORDER — GABAPENTIN 600 MG PO TABS: 1200.0000 mg | ORAL_TABLET | Freq: Four times a day (QID) | ORAL | 2 refills | Status: DC | PRN

## 2016-08-11 NOTE — Progress Notes (Signed)
Patient's Name: Willie Krueger  Patient type: Established  MRN: [id number]  Service setting: Ambulatory outpatient  DOB: [birth year]  Location: [facility]  DOS: 08/11/2016  Primary Care Physician: [facility]  Note by: Kathlen Brunswick. Dossie Arbour, M.D, DABA, Sarita Haver, [place], Milagros Evener, [place]  Referring Physician: Juanell Fairly, MD  Specialty: Board-Certified Interventional Pain Management  Last Visit to Pain Management: 07/30/2016   Primary Reason(s) for Visit: Encounter for prescription drug management & post-procedure evaluation of chronic illness with mild to moderate exacerbation(Level of risk: moderate) CC: Back Pain (lower); Extremity Pain (bilateral arms and bilateral legs); Hand Pain (bilateral); and Shoulder Pain (bilateral)   HPI  Willie Krueger is a 60 y.o. year old, male patient, who returns today as an established patient. He has Toe ulcer ([place]); Cervical spinal cord compression ([place]); Cardiac murmur; Chronic hepatitis C virus infection ([place]); HLD (hyperlipidemia); Benign hypertension; ED (erectile dysfunction) of organic origin; MI (mitral incompetence); Adiposity; Current smoker; History of decompression of median nerve; Chronic pain; Long term current use of opiate analgesic; Long term prescription opiate use; Opiate use (112.5 MME/Day); Opiate dependence ([place]); Encounter for therapeutic drug level monitoring; Chronic neck pain (Right side); Cervical spondylosis; Cervical post-laminectomy syndrome; Chronic low back pain (Location of Primary Source of Pain) (Midline pain) (R>L); Chronic cervical radicular pain (Bilateral) (R>L); Chronic lumbar radicular pain (Bilateral) (R>L); Substance use disorder Risk: LOW; Failed back surgical syndrome; Chronic lower extremity pain (Location of Secondary source of pain) (Bilateral) (R>L); History of carpal tunnel release of both wrists; Grade I anterolisthesis of C7 on T1; Encounter for chronic pain management; Elevated C-reactive protein  (CRP); Diabetic osteomyelitis ([place]); Neuropathy ([place]); Obesity; Neurogenic pain; Type 2 diabetes mellitus with foot ulcer (CODE) ([place]); Diabetic peripheral neuropathy ([place]); Chronic shoulder pain (Location of Tertiary source of pain) (Bilateral) (R>L); Lumbar facet syndrome (Location of Primary Source of Pain) (Bilateral) (R>L); Abnormal MRI, lumbar spine (05/19/2016); and Lumbar spondylosis on his problem list.. His primarily concern today is the Back Pain (lower); Extremity Pain (bilateral arms and bilateral legs); Hand Pain (bilateral); and Shoulder Pain (bilateral)   Pain Assessment: Self-Reported Pain Score: 4              Reported level is compatible with observation       Pain Type: Chronic pain Pain Location: Back Pain Orientation: Lower Pain Descriptors / Indicators: Burning Pain Frequency: Intermittent  The patient comes into the clinics today for post-procedure evaluation on the interventional treatment done on 07/29/2016. In addition, he comes in today for pharmacological management of his chronic pain.  The patient  reports that he does not use drugs.  Date of Last Visit: 07/29/16 Service Provided on Last Visit: Procedure (bilateral lumbar facet)  Controlled Substance Pharmacotherapy Assessment & REMS (Risk Evaluation and Mitigation Strategy)  Analgesic: Oxycodone IR 15 mg 5 times a day (75 mg/day). MME/day: 112.5 mg/day Pill Count: Oxycodone 15 mg filled 08/01/16 # 102/150 Pharmacokinetics: Onset of action (Liberation/Absorption): Within expected pharmacological parameters Time to Peak effect (Distribution): Timing and results are as within normal expected parameters Duration of action (Metabolism/Excretion): Within normal limits for medication Pharmacodynamics: Analgesic Effect: More than 50% Activity Facilitation: Medication(s) allow patient to sit, stand, walk, and do the basic ADLs Perceived Effectiveness: Described as relatively effective, allowing for increase in  activities of daily living (ADL) Side-effects or Adverse reactions: None reported Monitoring: [place] PMP: Online review of the past 13-month period conducted. Compliant with practice rules and regulations Last UDS on record: ToxAssure Select 13  Date Value Ref Range Status  05/28/2016 FINAL  Final    Comment:    ==================================================================== TOXASSURE SELECT 13 (MW) ==================================================================== Test                             Result       Flag       Units Drug Present and Declared for Prescription Verification   Oxycodone                      >4975        EXPECTED   ng/mg creat   Oxymorphone                    1287         EXPECTED   ng/mg creat   Noroxycodone                   >4975        EXPECTED   ng/mg creat   Noroxymorphone                 354          EXPECTED   ng/mg creat    Sources of oxycodone are scheduled prescription medications.    Oxymorphone, noroxycodone, and noroxymorphone are expected    metabolites of oxycodone. Oxymorphone is also available as a    scheduled prescription medication. ==================================================================== Test                      Result    Flag   Units      Ref Range   Creatinine              201              mg/dL      >=20 ==================================================================== Declared Medications:  The flagging and interpretation on this report are based on the  following declared medications.  Unexpected results may arise from  inaccuracies in the declared medications.  **Note: The testing scope of this panel includes these medications:  Oxycodone (Roxicodone)  **Note: The testing scope of this panel does not include following  reported medications:  Acetaminophen  Acetaminophen (Tylenol PM)  Aspirin  Diphenhydramine (Tylenol PM)  Furosemide (Lasix)  Gabapentin  Glimepiride (Amaryl)  Lisinopril  Metformin   Naloxone (Narcan)  Pravastatin (Pravachol)  Sildenafil (Viagra)  Vitamin D3 ==================================================================== For clinical consultation, please call [phone]. ====================================================================    UDS interpretation: Compliant          Medication Assessment Form: Reviewed. Patient indicates being compliant with therapy Treatment compliance: Compliant Risk Assessment: Aberrant Behavior: None observed today Substance Use Disorder (SUD) Risk Level: Low Risk of opioid abuse or dependence: 0.7-3.0% with doses ? 36 MME/day and 6.1-26% with doses ? 120 MME/day. Opioid Risk Tool (ORT) Score: Total Score: 0 Low Risk for SUD (Score <3) Depression Scale Score: PHQ-2: PHQ-2 Total Score: 0 No depression (0) PHQ-9: PHQ-9 Total Score: 0 No depression (0-4)  Pharmacologic Plan: No change in therapy, at this time  Post-Procedure Assessment  Procedure done on last visit: Diagnostic bilateral lumbar facet block under fluoroscopic guidance and IV sedation Side-effects or Adverse reactions: None reported Sedation: Sedation given  Results: Ultra-Short Term Relief (First 1 hour after procedure): 50 % (100% of usual pain was gone) Analgesia during this period is likely to be Local Anesthetic and/or IV Sedative (Analgesic/Anxiolitic) related Short  Term Relief (Initial 4-6 hrs after procedure): 50 % (100% of usual pain was gone) Complete relief confirms area to be the source of pain Long Term Relief : 50 % Long-term benefit would suggest an inflammatory etiology to the pain   Current Relief (Now): 75%  Persistent relief would suggest effective anti-inflammatory effects from steroids Interpretation of Results: Based on results, he would be a good candidate for RFA.  Laboratory Chemistry  Inflammation Markers Lab Results  Component Value Date   ESRSEDRATE 6 03/17/2016   CRP 1.2 (H) 03/17/2016    Renal Function Lab Results   Component Value Date   BUN 16 03/04/2016   CREATININE 0.98 03/04/2016   GFRAA >60 03/04/2016   GFRNONAA >60 03/04/2016    Hepatic Function Lab Results  Component Value Date   AST 23 03/04/2016   ALT 23 03/04/2016   ALBUMIN 3.4 (L) 03/04/2016    Electrolytes Lab Results  Component Value Date   NA 138 03/04/2016   K 4.8 03/04/2016   CL 107 03/04/2016   CALCIUM 9.7 03/04/2016   MG 1.9 02/27/2016    Pain Modulating Vitamins No results found for: Maralyn Sago [id number], [id number], [id number], 25OHVITD1, 25OHVITD2, 25OHVITD3, VITAMINB12  Coagulation Parameters Lab Results  Component Value Date   PLT 177 03/04/2016    Cardiovascular Lab Results  Component Value Date   BNP 400 (H) 01/02/2014   HGB 13.9 03/04/2016   HCT 41.6 03/04/2016    Note: Lab results reviewed.  Recent Diagnostic Imaging  Ir Fluoro Guide Cv Line Right Result Date: 03/10/2016 INDICATION: Osteomyelitis of the right foot. Request PICC line placement for prolonged IV antibiotics. EXAM: PICC LINE PLACEMENT WITH ULTRASOUND AND FLUOROSCOPIC GUIDANCE MEDICATIONS: None; ANESTHESIA/SEDATION: Moderate Sedation Time:  None The patient was continuously monitored during the procedure by the interventional radiology nurse under my direct supervision. FLUOROSCOPY TIME:  Fluoroscopy Time: 12 seconds COMPLICATIONS: None immediate. PROCEDURE: The patient was advised of the possible risks and complications and agreed to undergo the procedure. The patient was then brought to the angiographic suite for the procedure. The right arm was prepped with chlorhexidine, draped in the usual sterile fashion using maximum barrier technique (cap and mask, sterile gown, sterile gloves, large sterile sheet, hand hygiene and cutaneous antiseptic). Local anesthesia was attained by infiltration with 1% lidocaine. Ultrasound demonstrated patency of the brachial vein, and this was documented with an image. Under real-time ultrasound guidance, this  vein was accessed with a 21 gauge micropuncture needle and image documentation was performed. The needle was exchanged over a guidewire for a peel-away sheath through which a 43 cm 5 [place] single lumen power injectable PICC was advanced, and positioned with its tip at the lower SVC/right atrial junction. Fluoroscopy during the procedure and fluoro spot radiograph confirms appropriate catheter position. The catheter was flushed, secured to the skin with Prolene sutures, and covered with a sterile dressing. IMPRESSION: Successful placement of a right arm PICC with sonographic and fluoroscopic guidance. The catheter is ready for use. Read by: Ascencion Dike PA-C Electronically Signed   By: Corrie Mckusick D.O.   On: 03/10/2016 10:19   Ir US Guide Vasc Access Right Result Date: 03/10/2016 INDICATION: Osteomyelitis of the right foot. Request PICC line placement for prolonged IV antibiotics. EXAM: PICC LINE PLACEMENT WITH ULTRASOUND AND FLUOROSCOPIC GUIDANCE MEDICATIONS: None; ANESTHESIA/SEDATION: Moderate Sedation Time:  None The patient was continuously monitored during the procedure by the interventional radiology nurse under my direct supervision. FLUOROSCOPY TIME:  Fluoroscopy Time: 12 seconds COMPLICATIONS: None  immediate. PROCEDURE: The patient was advised of the possible risks and complications and agreed to undergo the procedure. The patient was then brought to the angiographic suite for the procedure. The right arm was prepped with chlorhexidine, draped in the usual sterile fashion using maximum barrier technique (cap and mask, sterile gown, sterile gloves, large sterile sheet, hand hygiene and cutaneous antiseptic). Local anesthesia was attained by infiltration with 1% lidocaine. Ultrasound demonstrated patency of the brachial vein, and this was documented with an image. Under real-time ultrasound guidance, this vein was accessed with a 21 gauge micropuncture needle and image documentation was performed. The  needle was exchanged over a guidewire for a peel-away sheath through which a 43 cm 5 [place] single lumen power injectable PICC was advanced, and positioned with its tip at the lower SVC/right atrial junction. Fluoroscopy during the procedure and fluoro spot radiograph confirms appropriate catheter position. The catheter was flushed, secured to the skin with Prolene sutures, and covered with a sterile dressing. IMPRESSION: Successful placement of a right arm PICC with sonographic and fluoroscopic guidance. The catheter is ready for use. Read by: Ascencion Dike PA-C Electronically Signed   By: Corrie Mckusick D.O.   On: 03/10/2016 10:19    Meds  The patient has a current medication list which includes the following prescription(s): acetaminophen, aspirin ec, vitamin d3, diphenhydramine-acetaminophen, furosemide, glimepiride, ibuprofen, lisinopril, metformin, pravastatin, sildenafil, gabapentin, oxycodone, oxycodone, and oxycodone.  Current Outpatient Prescriptions on File Prior to Visit  Medication Sig  . acetaminophen (TYLENOL) 500 MG tablet Take 500 mg by mouth every 4 (four) hours. Scheduled (5 times daily)  . aspirin EC 81 MG tablet Take 81 mg by mouth daily.  . Cholecalciferol (VITAMIN D3) 2000 units capsule Take 2,000 Units by mouth daily.   . diphenhydramine-acetaminophen (TYLENOL PM) 25-500 MG TABS tablet Take 2 tablets by mouth at bedtime. Reported on 03/17/2016  . furosemide (LASIX) 20 MG tablet Take 40 mg by mouth 2 (two) times daily.   Marland Kitchen glimepiride (AMARYL) 2 MG tablet Take 2 mg by mouth daily with breakfast.   . ibuprofen (ADVIL,MOTRIN) 800 MG tablet Take by mouth.  Marland Kitchen lisinopril (PRINIVIL,ZESTRIL) 40 MG tablet Take 40 mg by mouth daily.  . metFORMIN (GLUCOPHAGE-XR) 500 MG 24 hr tablet Take 500 mg by mouth 2 (two) times daily.  . pravastatin (PRAVACHOL) 20 MG tablet Take 20 mg by mouth at bedtime.   . sildenafil (VIAGRA) 100 MG tablet Take 100 mg by mouth daily as needed for erectile  dysfunction.   No current facility-administered medications on file prior to visit.     ROS  Constitutional: Denies any fever or chills Gastrointestinal: No reported hemesis, hematochezia, vomiting, or acute GI distress Musculoskeletal: Denies any acute onset joint swelling, redness, loss of ROM, or weakness Neurological: No reported episodes of acute onset apraxia, aphasia, dysarthria, agnosia, amnesia, paralysis, loss of coordination, or loss of consciousness  Allergies  Mr. Lawver is allergic to cleocin [clindamycin] and sulfa antibiotics.  [place]  Medical:  Mr. Montelongo  has a past medical history of Chronic pain; Chronic pain associated with significant psychosocial dysfunction (05/04/2015); Diabetes mellitus without complication ([place]); Heart murmur; History of spinal stenosis (10/31/2015); Hyperlipidemia; Hypertension; Leg weakness (07/07/2014); Mitral valve regurgitation; Nerve root inflammation (06/26/2014); and Osteoarthritis. Family: family history includes Cancer in his mother; Diabetes in his mother; Heart disease in his father. Surgical:  has a past surgical history that includes Carpal tunnel release and Spine surgery. Tobacco:  reports that he has been smoking Cigarettes.  He has been smoking about 1.50 packs per day. He has never used smokeless tobacco. Alcohol:  reports that he does not drink alcohol. Drug:  reports that he does not use drugs.  Constitutional Exam  Vitals: Blood pressure 136/81, pulse 73, temperature 98.8 F (37.1 C), temperature source Oral, resp. rate 17, height 6\' 2"  (1.88 m), weight 237 lb (107.5 kg), SpO2 100 %. General appearance: Well nourished, well developed, and well hydrated. In no acute distress Calculated BMI/Body habitus: Body mass index is 30.43 kg/m. (30-34.9 kg/m2) Obese (Class I) - 68% higher incidence of chronic pain Psych/Mental status: Alert and oriented x 3 (person, place, & time) Eyes: PERLA Respiratory: No evidence of acute respiratory  distress  Cervical Spine Exam  Inspection: No masses, redness, or swelling Alignment: Symmetrical Functional ROM: ROM appears unrestricted Stability: No instability detected Muscle strength & Tone: Functionally intact Sensory: Unimpaired Palpation: Non-contributory  Upper Extremity (UE) Exam    Side: Right upper extremity  Side: Left upper extremity  Inspection: No masses, redness, swelling, or asymmetry  Inspection: No masses, redness, swelling, or asymmetry  Functional ROM: ROM appears unrestricted  Functional ROM: ROM appears unrestricted  Muscle strength & Tone: Functionally intact  Muscle strength & Tone: Functionally intact  Sensory: Unimpaired  Sensory: Unimpaired  Palpation: Non-contributory  Palpation: Non-contributory   Thoracic Spine Exam  Inspection: No masses, redness, or swelling Alignment: Symmetrical Functional ROM: ROM appears unrestricted Stability: No instability detected Sensory: Unimpaired Muscle strength & Tone: Functionally intact Palpation: Non-contributory  Lumbar Spine Exam  Inspection: No masses, redness, or swelling Alignment: Symmetrical Functional ROM: Decreased ROM Stability: No instability detected Muscle strength & Tone: Functionally intact Sensory: Movement-associated pain Palpation: Complains of area being tender to palpation Provocative Tests: Lumbar Hyperextension and rotation test: Positive bilaterally for facet joint pain. Patrick's Maneuver: evaluation deferred today              Gait & Posture Assessment  Ambulation: Unassisted Gait: Relatively normal for age and body habitus Posture: WNL   Lower Extremity Exam    Side: Right lower extremity  Side: Left lower extremity  Inspection: No masses, redness, swelling, or asymmetry  Inspection: No masses, redness, swelling, or asymmetry  Functional ROM: ROM appears unrestricted  Functional ROM: ROM appears unrestricted  Muscle strength & Tone: Functionally intact  Muscle strength &  Tone: Functionally intact  Sensory: Unimpaired  Sensory: Unimpaired  Palpation: Non-contributory  Palpation: Non-contributory    Assessment & Plan  Primary Diagnosis & Pertinent Problem List: The primary encounter diagnosis was Chronic pain. Diagnoses of Long term current use of opiate analgesic, Opiate use (112.5 MME/Day), Neurogenic pain, Chronic low back pain (Location of Primary Source of Pain) (Midline pain) (R>L), Chronic pain of lower extremity, unspecified laterality, Chronic shoulder pain (Location of Tertiary source of pain) (Bilateral) (R>L), Lumbar facet syndrome (Location of Primary Source of Pain) (Bilateral) (R>L), and Lumbar spondylosis, unspecified spinal osteoarthritis were also pertinent to this visit.  Visit Diagnosis: 1. Chronic pain   2. Long term current use of opiate analgesic   3. Opiate use (112.5 MME/Day)   4. Neurogenic pain   5. Chronic low back pain (Location of Primary Source of Pain) (Midline pain) (R>L)   6. Chronic pain of lower extremity, unspecified laterality   7. Chronic shoulder pain (Location of Tertiary source of pain) (Bilateral) (R>L)   8. Lumbar facet syndrome (Location of Primary Source of Pain) (Bilateral) (R>L)   9. Lumbar spondylosis, unspecified spinal osteoarthritis     Problems  updated and reviewed during this visit: No problems updated.  Problem-specific Plan(s): No problem-specific Assessment & Plan notes found for this encounter.  No new Assessment & Plan notes have been filed under this hospital service since the last note was generated. Service: Pain Management   Plan of Care   Problem List Items Addressed This Visit      High   Chronic low back pain (Location of Primary Source of Pain) (Midline pain) (R>L) (Chronic)   Relevant Medications   oxyCODONE (ROXICODONE) 15 MG immediate release tablet   oxyCODONE (ROXICODONE) 15 MG immediate release tablet   oxyCODONE (ROXICODONE) 15 MG immediate release tablet   Chronic lower  extremity pain (Location of Secondary source of pain) (Bilateral) (R>L) (Chronic)   Chronic pain - Primary (Chronic)   Relevant Medications   oxyCODONE (ROXICODONE) 15 MG immediate release tablet   oxyCODONE (ROXICODONE) 15 MG immediate release tablet   oxyCODONE (ROXICODONE) 15 MG immediate release tablet   gabapentin (NEURONTIN) 600 MG tablet   Chronic shoulder pain (Location of Tertiary source of pain) (Bilateral) (R>L) (Chronic)   Lumbar facet syndrome (Location of Primary Source of Pain) (Bilateral) (R>L) (Chronic)   Relevant Medications   oxyCODONE (ROXICODONE) 15 MG immediate release tablet   oxyCODONE (ROXICODONE) 15 MG immediate release tablet   oxyCODONE (ROXICODONE) 15 MG immediate release tablet   Other Relevant Orders   Radiofrequency,Lumbar   Lumbar spondylosis (Chronic)   Relevant Medications   oxyCODONE (ROXICODONE) 15 MG immediate release tablet   oxyCODONE (ROXICODONE) 15 MG immediate release tablet   oxyCODONE (ROXICODONE) 15 MG immediate release tablet   Other Relevant Orders   Radiofrequency,Lumbar   Neurogenic pain (Chronic)   Relevant Medications   gabapentin (NEURONTIN) 600 MG tablet     Medium   Long term current use of opiate analgesic (Chronic)   Opiate use (112.5 MME/Day) (Chronic)    Other Visit Diagnoses   None.      Pharmacotherapy (Medications Ordered): Meds ordered this encounter  Medications  . oxyCODONE (ROXICODONE) 15 MG immediate release tablet    Sig: Take 1 tablet (15 mg total) by mouth 5 (five) times daily as needed for pain.    Dispense:  150 tablet    Refill:  0    Do not place this medication, or any other prescription from our practice, on "Automatic Refill". Patient may have prescription filled one day early if pharmacy is closed on scheduled refill date. Do not fill until: 08/30/16 To last until: 09/29/16  . oxyCODONE (ROXICODONE) 15 MG immediate release tablet    Sig: Take 1 tablet (15 mg total) by mouth 5 (five) times daily  as needed for pain.    Dispense:  150 tablet    Refill:  0    Do not place this medication, or any other prescription from our practice, on "Automatic Refill". Patient may have prescription filled one day early if pharmacy is closed on scheduled refill date. Do not fill until: 09/29/16 To last until: 10/29/16  . oxyCODONE (ROXICODONE) 15 MG immediate release tablet    Sig: Take 1 tablet (15 mg total) by mouth 5 (five) times daily as needed for pain.    Dispense:  150 tablet    Refill:  0    Do not place this medication, or any other prescription from our practice, on "Automatic Refill". Patient may have prescription filled one day early if pharmacy is closed on scheduled refill date. Do not fill until: 10/29/16 To last until: 11/28/16  .  gabapentin (NEURONTIN) 600 MG tablet    Sig: Take 2 tablets (1,200 mg total) by mouth every 6 (six) hours as needed.    Dispense:  240 tablet    Refill:  2    Do not place this medication, or any other prescription from our practice, on "Automatic Refill". Patient may have prescription filled one day early if pharmacy is closed on scheduled refill date.    Lab-work & Procedure Ordered: Orders Placed This Encounter  Procedures  . Radiofrequency,Lumbar    Imaging Ordered: None  Interventional Therapies: Scheduled:  Right Lumbar facet RFA.   Considering:  N/A   PRN Procedures:  N/A   Referral(s) or Consult(s): None at this time.  New Prescriptions   No medications on file    Medications administered during this visit: Mr. Michaelis had no medications administered during this visit.  Requested PM Follow-up: Return in 3 months (on 11/12/2016) for Med-Mgmt, In addition, Schedule Procedure.  Future Appointments Date Time Provider [place]  11/12/2016 10:00 AM Milinda Pointer, MD [facility] None    Primary Care Physician: [facility] PRIMARY [facility]ARE Location: [facility] Note by: Kathlen Brunswick. Dossie Arbour, M.D,  DABA, DABAPM, DABPM, DABIPP, FIPP  Pain Score Disclaimer: We use the NRS-11 scale. This is a self-reported, subjective measurement of pain severity with only modest accuracy. It is used primarily to identify changes within a particular patient. It must be understood that outpatient pain scales are significantly less accurate that those used for research, where they can be applied under ideal controlled circumstances with minimal exposure to variables. In reality, the score is likely to be a combination of pain intensity and pain affect, where pain affect describes the degree of emotional arousal or changes in action readiness caused by the sensory experience of pain. Factors such as social and work situation, setting, emotional state, anxiety levels, expectation, and prior pain experience may influence pain perception and show large inter-individual differences that may also be affected by time variables.  Patient instructions provided at this appointment:: Patient Instructions  Radiofrequency Lesioning Radiofrequency lesioning is a procedure that is performed to relieve pain. The procedure is often used for back, neck, or arm pain. Radiofrequency lesioning involves the use of a machine that creates radio waves to make heat. During the procedure, the heat is applied to the nerve that carries the pain signal. The heat damages the nerve and interferes with the pain signal. Pain relief usually lasts for 6 months to 1 year. LET [facility] CARE PROVIDER KNOW ABOUT:  Any allergies you have.  All medicines you are taking, including vitamins, herbs, eye drops, creams, and over-the-counter medicines.  Previous problems you or members of your family have had with the use of anesthetics.  Any blood disorders you have.  Previous surgeries you have had.  Any medical conditions you have.  Whether you are pregnant or may be pregnant. RISKS AND COMPLICATIONS Generally, this is a safe procedure. However,  problems may occur, including:  Pain or soreness at the injection site.  Infection at the injection site.  Damage to nerves or blood vessels. BEFORE THE PROCEDURE  Ask your health care provider about:  Changing or stopping your regular medicines. This is especially important if you are taking diabetes medicines or blood thinners.  Taking medicines such as aspirin and ibuprofen. These medicines can thin your blood. Do not take these medicines before your procedure if your health care provider instructs you not to.  Follow instructions from your health  care provider about eating or drinking restrictions.  Plan to have someone take you home after the procedure.  If you go home right after the procedure, plan to have someone with you for 24 hours. PROCEDURE  You will be given one or more of the following:  A medicine to help you relax (sedative).  A medicine to numb the area (local anesthetic).  You will be awake during the procedure. You will need to be able to talk with the health care provider during the procedure.  With the help of a type of X-ray (fluoroscopy), the health care provider will insert a radiofrequency needle into the area to be treated.  Next, a wire that carries the radio waves (electrode) will be put through the radiofrequency needle. An electrical pulse will be sent through the electrode to verify the correct nerve. You will feel a tingling sensation, and you may have muscle twitching.  Then, the tissue that is around the needle tip will be heated by an electric current that is passed using the radiofrequency machine. This will numb the nerves.  A bandage (dressing) will be put on the insertion area after the procedure is done. The procedure may vary among health care providers and hospitals. AFTER THE PROCEDURE  Your blood pressure, heart rate, breathing rate, and blood oxygen level will be monitored often until the medicines you were given have worn  off.  Return to your normal activities as directed by your health care provider.   This information is not intended to replace advice given to you by your health care provider. Make sure you discuss any questions you have with your health care provider.   Document Released: 08/13/2011 Document Revised: 09/05/2015 Document Reviewed: 01/22/2015 Elsevier Interactive Patient Education 2016 Elsevier Inc[employer]   DO NOT EAT OR DRINK FOR 8 HOURS PRIOR TO THE PROCEDURE BRING A DRIVER DO NOT TAKE DIABETIC MED UNTIL YOU GET HOME AND CAN EAT. TAKE YOUR BLOOD PRESSURE MEDICAINE THE MORNING OF YOUR PROCEDURE.

## 2016-08-11 NOTE — Progress Notes (Signed)
Pill Count Oxycodone 15 mg filled 08/01/16 # 102/150

## 2016-08-11 NOTE — Patient Instructions (Signed)
Radiofrequency Lesioning Radiofrequency lesioning is a procedure that is performed to relieve pain. The procedure is often used for back, neck, or arm pain. Radiofrequency lesioning involves the use of a machine that creates radio waves to make heat. During the procedure, the heat is applied to the nerve that carries the pain signal. The heat damages the nerve and interferes with the pain signal. Pain relief usually lasts for 6 months to 1 year. LET [facility] CARE PROVIDER KNOW ABOUT:  Any allergies you have.  All medicines you are taking, including vitamins, herbs, eye drops, creams, and over-the-counter medicines.  Previous problems you or members of your family have had with the use of anesthetics.  Any blood disorders you have.  Previous surgeries you have had.  Any medical conditions you have.  Whether you are pregnant or may be pregnant. RISKS AND COMPLICATIONS Generally, this is a safe procedure. However, problems may occur, including:  Pain or soreness at the injection site.  Infection at the injection site.  Damage to nerves or blood vessels. BEFORE THE PROCEDURE  Ask your health care provider about:  Changing or stopping your regular medicines. This is especially important if you are taking diabetes medicines or blood thinners.  Taking medicines such as aspirin and ibuprofen. These medicines can thin your blood. Do not take these medicines before your procedure if your health care provider instructs you not to.  Follow instructions from your health care provider about eating or drinking restrictions.  Plan to have someone take you home after the procedure.  If you go home right after the procedure, plan to have someone with you for 24 hours. PROCEDURE  You will be given one or more of the following:  A medicine to help you relax (sedative).  A medicine to numb the area (local anesthetic).  You will be awake during the procedure. You will need to be able to  talk with the health care provider during the procedure.  With the help of a type of X-ray (fluoroscopy), the health care provider will insert a radiofrequency needle into the area to be treated.  Next, a wire that carries the radio waves (electrode) will be put through the radiofrequency needle. An electrical pulse will be sent through the electrode to verify the correct nerve. You will feel a tingling sensation, and you may have muscle twitching.  Then, the tissue that is around the needle tip will be heated by an electric current that is passed using the radiofrequency machine. This will numb the nerves.  A bandage (dressing) will be put on the insertion area after the procedure is done. The procedure may vary among health care providers and hospitals. AFTER THE PROCEDURE  Your blood pressure, heart rate, breathing rate, and blood oxygen level will be monitored often until the medicines you were given have worn off.  Return to your normal activities as directed by your health care provider.   This information is not intended to replace advice given to you by your health care provider. Make sure you discuss any questions you have with your health care provider.   Document Released: 08/13/2011 Document Revised: 09/05/2015 Document Reviewed: 01/22/2015 Elsevier Interactive Patient Education 2016 Elsevier Inc.   DO NOT EAT OR DRINK FOR 8 HOURS PRIOR TO THE PROCEDURE BRING A DRIVER DO NOT TAKE DIABETIC MED UNTIL YOU GET HOME AND CAN EAT. TAKE YOUR BLOOD PRESSURE MEDICAINE THE MORNING OF YOUR PROCEDURE.

## 2016-09-28 HISTORY — PX: FOOT SURGERY: SHX648

## 2016-10-13 ENCOUNTER — Inpatient Hospital Stay: Payer: 59 | Admitting: Anesthesiology

## 2016-10-13 ENCOUNTER — Inpatient Hospital Stay: Payer: 59

## 2016-10-13 ENCOUNTER — Inpatient Hospital Stay
Admission: AD | Admit: 2016-10-13 | Discharge: 2016-10-16 | DRG: 617 | Disposition: A | Discharge status: Home Health | Payer: 59 | Source: Ambulatory Visit | Attending: Internal Medicine | Admitting: Internal Medicine

## 2016-10-13 ENCOUNTER — Encounter
Admission: AD | Disposition: A | Discharge status: Home Health | Payer: Self-pay | Source: Ambulatory Visit | Attending: Internal Medicine

## 2016-10-13 DIAGNOSIS — L089 Local infection of the skin and subcutaneous tissue, unspecified: Secondary | ICD-10-CM | POA: Diagnosis present

## 2016-10-13 DIAGNOSIS — E1165 Type 2 diabetes mellitus with hyperglycemia: Secondary | ICD-10-CM | POA: Diagnosis present

## 2016-10-13 DIAGNOSIS — I340 Nonrheumatic mitral (valve) insufficiency: Secondary | ICD-10-CM | POA: Diagnosis present

## 2016-10-13 DIAGNOSIS — T383X6A Underdosing of insulin and oral hypoglycemic [antidiabetic] drugs, initial encounter: Secondary | ICD-10-CM | POA: Diagnosis present

## 2016-10-13 DIAGNOSIS — M4802 Spinal stenosis, cervical region: Secondary | ICD-10-CM | POA: Diagnosis present

## 2016-10-13 DIAGNOSIS — M86172 Other acute osteomyelitis, left ankle and foot: Secondary | ICD-10-CM | POA: Diagnosis present

## 2016-10-13 DIAGNOSIS — G894 Chronic pain syndrome: Secondary | ICD-10-CM | POA: Diagnosis present

## 2016-10-13 DIAGNOSIS — Z7982 Long term (current) use of aspirin: Secondary | ICD-10-CM | POA: Diagnosis not present

## 2016-10-13 DIAGNOSIS — E1140 Type 2 diabetes mellitus with diabetic neuropathy, unspecified: Secondary | ICD-10-CM | POA: Diagnosis present

## 2016-10-13 DIAGNOSIS — F17210 Nicotine dependence, cigarettes, uncomplicated: Secondary | ICD-10-CM | POA: Diagnosis present

## 2016-10-13 DIAGNOSIS — E785 Hyperlipidemia, unspecified: Secondary | ICD-10-CM | POA: Diagnosis present

## 2016-10-13 DIAGNOSIS — G8929 Other chronic pain: Secondary | ICD-10-CM

## 2016-10-13 DIAGNOSIS — Z9114 Patient's other noncompliance with medication regimen: Secondary | ICD-10-CM

## 2016-10-13 DIAGNOSIS — Z7984 Long term (current) use of oral hypoglycemic drugs: Secondary | ICD-10-CM

## 2016-10-13 DIAGNOSIS — Z833 Family history of diabetes mellitus: Secondary | ICD-10-CM

## 2016-10-13 DIAGNOSIS — I10 Essential (primary) hypertension: Secondary | ICD-10-CM | POA: Diagnosis present

## 2016-10-13 DIAGNOSIS — R52 Pain, unspecified: Secondary | ICD-10-CM

## 2016-10-13 DIAGNOSIS — E1169 Type 2 diabetes mellitus with other specified complication: Secondary | ICD-10-CM | POA: Diagnosis present

## 2016-10-13 DIAGNOSIS — L03116 Cellulitis of left lower limb: Secondary | ICD-10-CM | POA: Diagnosis present

## 2016-10-13 DIAGNOSIS — E11628 Type 2 diabetes mellitus with other skin complications: Secondary | ICD-10-CM | POA: Diagnosis present

## 2016-10-13 DIAGNOSIS — M545 Low back pain: Secondary | ICD-10-CM | POA: Diagnosis present

## 2016-10-13 DIAGNOSIS — Z8249 Family history of ischemic heart disease and other diseases of the circulatory system: Secondary | ICD-10-CM | POA: Diagnosis not present

## 2016-10-13 DIAGNOSIS — R630 Anorexia: Secondary | ICD-10-CM | POA: Diagnosis present

## 2016-10-13 DIAGNOSIS — Z79891 Long term (current) use of opiate analgesic: Secondary | ICD-10-CM

## 2016-10-13 DIAGNOSIS — R2689 Other abnormalities of gait and mobility: Secondary | ICD-10-CM

## 2016-10-13 DIAGNOSIS — Z95828 Presence of other vascular implants and grafts: Secondary | ICD-10-CM

## 2016-10-13 HISTORY — PX: IRRIGATION AND DEBRIDEMENT FOOT: SHX6602

## 2016-10-13 LAB — BASIC METABOLIC PANEL
ANION GAP: 8 (ref 5–15)
BUN: 17 mg/dL (ref 6–20)
CO2: 23 mmol/L (ref 22–32)
Calcium: 8.8 mg/dL — ABNORMAL LOW (ref 8.9–10.3)
Chloride: 102 mmol/L (ref 101–111)
Creatinine, Ser: 1.06 mg/dL (ref 0.61–1.24)
GLUCOSE: 127 mg/dL — AB (ref 65–99)
POTASSIUM: 4.2 mmol/L (ref 3.5–5.1)
Sodium: 133 mmol/L — ABNORMAL LOW (ref 135–145)

## 2016-10-13 LAB — GLUCOSE, CAPILLARY
GLUCOSE-CAPILLARY: 133 mg/dL — AB (ref 65–99)
Glucose-Capillary: 106 mg/dL — ABNORMAL HIGH (ref 65–99)

## 2016-10-13 LAB — C-REACTIVE PROTEIN: CRP: 28.9 mg/dL — ABNORMAL HIGH (ref <1.0)

## 2016-10-13 LAB — SEDIMENTATION RATE: SED RATE: 82 mm/h — AB (ref 0–20)

## 2016-10-13 LAB — SURGICAL PCR SCREEN
MRSA, PCR: NEGATIVE
STAPHYLOCOCCUS AUREUS: NEGATIVE

## 2016-10-13 SURGERY — IRRIGATION AND DEBRIDEMENT FOOT
Anesthesia: General | Site: Foot | Laterality: Left | Wound class: Dirty or Infected

## 2016-10-13 MED ORDER — OXYCODONE HCL 5 MG PO TABS
15.0000 mg | ORAL_TABLET | Freq: Every day | ORAL | Status: DC
  Administered 2016-10-13 – 2016-10-16 (×14): 15 mg via ORAL
  Filled 2016-10-13 (×14): qty 3

## 2016-10-13 MED ORDER — ASPIRIN EC 81 MG PO TBEC
81.0000 mg | DELAYED_RELEASE_TABLET | Freq: Every day | ORAL | Status: DC
  Administered 2016-10-14 – 2016-10-16 (×3): 81 mg via ORAL
  Filled 2016-10-13 (×3): qty 1

## 2016-10-13 MED ORDER — DOCUSATE SODIUM 100 MG PO CAPS
100.0000 mg | ORAL_CAPSULE | Freq: Two times a day (BID) | ORAL | Status: DC
  Administered 2016-10-14 – 2016-10-16 (×6): 100 mg via ORAL
  Filled 2016-10-13 (×6): qty 1

## 2016-10-13 MED ORDER — ONDANSETRON HCL 4 MG/2ML IJ SOLN
4.0000 mg | Freq: Four times a day (QID) | INTRAMUSCULAR | Status: DC | PRN
Start: 2016-10-13 — End: 2016-10-16

## 2016-10-13 MED ORDER — ACETAMINOPHEN 500 MG PO TABS
500.0000 mg | ORAL_TABLET | ORAL | Status: DC
  Administered 2016-10-14 – 2016-10-16 (×12): 500 mg via ORAL
  Filled 2016-10-13 (×12): qty 1

## 2016-10-13 MED ORDER — ACETAMINOPHEN 500 MG PO TABS: 500.0000 mg | ORAL_TABLET | ORAL | Status: DC

## 2016-10-13 MED ORDER — ONDANSETRON HCL 4 MG/2ML IJ SOLN
4.0000 mg | Freq: Four times a day (QID) | INTRAMUSCULAR | Status: DC | PRN
  Administered 2016-10-13: 4 mg via INTRAVENOUS

## 2016-10-13 MED ORDER — LIDOCAINE HCL 1 % IJ SOLN
INTRAMUSCULAR | Status: DC | PRN
  Administered 2016-10-13: 5 mL

## 2016-10-13 MED ORDER — POVIDONE-IODINE 10 % EX SWAB
2.0000 "application " | Freq: Once | CUTANEOUS | Status: AC
  Administered 2016-10-13: 2 via TOPICAL

## 2016-10-13 MED ORDER — VANCOMYCIN HCL 10 G IV SOLR
1250.0000 mg | Freq: Two times a day (BID) | INTRAVENOUS | Status: DC
  Administered 2016-10-14 – 2016-10-16 (×5): 1250 mg via INTRAVENOUS
  Filled 2016-10-13 (×7): qty 1250

## 2016-10-13 MED ORDER — ACETAMINOPHEN 500 MG PO TABS: 500.0000 mg | ORAL_TABLET | Freq: Four times a day (QID) | ORAL | Status: DC

## 2016-10-13 MED ORDER — LACTATED RINGERS IV SOLN
INTRAVENOUS | Status: DC | PRN
  Administered 2016-10-13: 22:00:00 via INTRAVENOUS

## 2016-10-13 MED ORDER — FUROSEMIDE 40 MG PO TABS
40.0000 mg | ORAL_TABLET | Freq: Two times a day (BID) | ORAL | Status: DC
  Administered 2016-10-14 – 2016-10-16 (×5): 40 mg via ORAL
  Filled 2016-10-13 (×5): qty 1

## 2016-10-13 MED ORDER — PIPERACILLIN-TAZOBACTAM 3.375 G IVPB
3.3750 g | Freq: Three times a day (TID) | INTRAVENOUS | Status: DC
  Administered 2016-10-13 – 2016-10-16 (×9): 3.375 g via INTRAVENOUS
  Filled 2016-10-13 (×8): qty 50

## 2016-10-13 MED ORDER — NICOTINE 21 MG/24HR TD PT24
21.0000 mg | MEDICATED_PATCH | Freq: Every day | TRANSDERMAL | Status: DC
  Filled 2016-10-13 (×3): qty 1

## 2016-10-13 MED ORDER — ACETAMINOPHEN 650 MG RE SUPP: 650.0000 mg | Freq: Four times a day (QID) | RECTAL | Status: DC | PRN

## 2016-10-13 MED ORDER — PROPOFOL 10 MG/ML IV BOLUS
INTRAVENOUS | Status: DC | PRN
  Administered 2016-10-13: 140 mg via INTRAVENOUS

## 2016-10-13 MED ORDER — FENTANYL CITRATE (PF) 100 MCG/2ML IJ SOLN: 25.0000 ug | INTRAMUSCULAR | Status: DC | PRN

## 2016-10-13 MED ORDER — CHLORHEXIDINE GLUCONATE CLOTH 2 % EX PADS: 6.0000 | MEDICATED_PAD | Freq: Once | CUTANEOUS | Status: DC

## 2016-10-13 MED ORDER — CEFAZOLIN SODIUM-DEXTROSE 2-4 GM/100ML-% IV SOLN
2.0000 g | INTRAVENOUS | Status: AC
  Administered 2016-10-13: 2 g via INTRAVENOUS
  Filled 2016-10-13: qty 100

## 2016-10-13 MED ORDER — BISACODYL 5 MG PO TBEC: 5.0000 mg | DELAYED_RELEASE_TABLET | Freq: Every day | ORAL | Status: DC | PRN

## 2016-10-13 MED ORDER — PIPERACILLIN-TAZOBACTAM 3.375 G IVPB
3.3750 g | Freq: Once | INTRAVENOUS | Status: DC
  Filled 2016-10-13: qty 50

## 2016-10-13 MED ORDER — FENTANYL CITRATE (PF) 100 MCG/2ML IJ SOLN
INTRAMUSCULAR | Status: DC | PRN
  Administered 2016-10-13 (×2): 50 ug via INTRAVENOUS

## 2016-10-13 MED ORDER — ONDANSETRON HCL 4 MG PO TABS: 4.0000 mg | ORAL_TABLET | Freq: Four times a day (QID) | ORAL | Status: DC | PRN

## 2016-10-13 MED ORDER — GLIMEPIRIDE 2 MG PO TABS
2.0000 mg | ORAL_TABLET | Freq: Every day | ORAL | Status: DC
  Administered 2016-10-14 – 2016-10-16 (×3): 2 mg via ORAL
  Filled 2016-10-13 (×4): qty 1

## 2016-10-13 MED ORDER — SODIUM CHLORIDE 0.9 % IV SOLN: INTRAVENOUS | Status: DC

## 2016-10-13 MED ORDER — VITAMIN D 1000 UNITS PO TABS
2000.0000 [IU] | ORAL_TABLET | Freq: Every day | ORAL | Status: DC
  Administered 2016-10-14 – 2016-10-16 (×3): 2000 [IU] via ORAL
  Filled 2016-10-13 (×3): qty 2

## 2016-10-13 MED ORDER — SODIUM CHLORIDE 0.9 % IV SOLN
INTRAVENOUS | Status: DC
  Administered 2016-10-14: 01:00:00 via INTRAVENOUS
  Administered 2016-10-14: 1000 mL via INTRAVENOUS

## 2016-10-13 MED ORDER — ACETAMINOPHEN 325 MG PO TABS
650.0000 mg | ORAL_TABLET | Freq: Four times a day (QID) | ORAL | Status: DC | PRN
  Filled 2016-10-13: qty 2

## 2016-10-13 MED ORDER — BUPIVACAINE HCL 0.5 % IJ SOLN
INTRAMUSCULAR | Status: DC | PRN
  Administered 2016-10-13: 15 mL

## 2016-10-13 MED ORDER — ENOXAPARIN SODIUM 40 MG/0.4ML ~~LOC~~ SOLN
40.0000 mg | SUBCUTANEOUS | Status: DC
  Administered 2016-10-14 – 2016-10-15 (×3): 40 mg via SUBCUTANEOUS
  Filled 2016-10-13 (×3): qty 0.4

## 2016-10-13 MED ORDER — MORPHINE SULFATE (PF) 2 MG/ML IV SOLN: 1.0000 mg | INTRAVENOUS | Status: DC | PRN

## 2016-10-13 MED ORDER — GABAPENTIN 600 MG PO TABS
1200.0000 mg | ORAL_TABLET | Freq: Three times a day (TID) | ORAL | Status: DC
  Administered 2016-10-14 – 2016-10-16 (×8): 1200 mg via ORAL
  Filled 2016-10-13 (×8): qty 2

## 2016-10-13 MED ORDER — LISINOPRIL 20 MG PO TABS
40.0000 mg | ORAL_TABLET | Freq: Every day | ORAL | Status: DC
  Administered 2016-10-14 – 2016-10-16 (×3): 40 mg via ORAL
  Filled 2016-10-13 (×3): qty 2

## 2016-10-13 MED ORDER — ONDANSETRON HCL 4 MG PO TABS
4.0000 mg | ORAL_TABLET | Freq: Four times a day (QID) | ORAL | Status: DC | PRN
Start: 2016-10-13 — End: 2016-10-14

## 2016-10-13 MED ORDER — VANCOMYCIN HCL IN DEXTROSE 1-5 GM/200ML-% IV SOLN
1000.0000 mg | INTRAVENOUS | Status: DC
  Filled 2016-10-13 (×2): qty 200

## 2016-10-13 MED ORDER — INSULIN ASPART 100 UNIT/ML ~~LOC~~ SOLN
0.0000 [IU] | Freq: Three times a day (TID) | SUBCUTANEOUS | Status: DC
  Administered 2016-10-14 (×2): 3 [IU] via SUBCUTANEOUS
  Administered 2016-10-14: 8 [IU] via SUBCUTANEOUS
  Administered 2016-10-15: 2 [IU] via SUBCUTANEOUS
  Administered 2016-10-15: 3 [IU] via SUBCUTANEOUS
  Administered 2016-10-16: 1 [IU] via SUBCUTANEOUS
  Administered 2016-10-16: 2 [IU] via SUBCUTANEOUS
  Filled 2016-10-13: qty 5
  Filled 2016-10-13: qty 2
  Filled 2016-10-13: qty 3
  Filled 2016-10-13: qty 1
  Filled 2016-10-13: qty 2
  Filled 2016-10-13: qty 3

## 2016-10-13 MED ORDER — HYDROCODONE-ACETAMINOPHEN 5-325 MG PO TABS: 1.0000 | ORAL_TABLET | ORAL | Status: DC | PRN

## 2016-10-13 MED ORDER — OXYCODONE-ACETAMINOPHEN 5-325 MG PO TABS
1.0000 | ORAL_TABLET | ORAL | Status: DC | PRN
  Administered 2016-10-14 – 2016-10-15 (×2): 2 via ORAL
  Administered 2016-10-16: 1 via ORAL
  Filled 2016-10-13: qty 1
  Filled 2016-10-13 (×2): qty 2

## 2016-10-13 MED ORDER — ONDANSETRON HCL 4 MG/2ML IJ SOLN: 4.0000 mg | Freq: Once | INTRAMUSCULAR | Status: DC | PRN

## 2016-10-13 MED ORDER — METFORMIN HCL ER 500 MG PO TB24
500.0000 mg | ORAL_TABLET | Freq: Two times a day (BID) | ORAL | Status: DC
  Administered 2016-10-14 – 2016-10-16 (×6): 500 mg via ORAL
  Filled 2016-10-13 (×6): qty 1

## 2016-10-13 SURGICAL SUPPLY — 56 items
BANDAGE ACE 4X5 VEL STRL LF (GAUZE/BANDAGES/DRESSINGS) ×2 IMPLANT
BANDAGE STRETCH 3X4.1 STRL (GAUZE/BANDAGES/DRESSINGS) ×2 IMPLANT
BLADE OSC/SAGITTAL MD 5.5X18 (BLADE) ×2 IMPLANT
BLADE OSCILLATING/SAGITTAL (BLADE)
BLADE SW THK.38XMED LNG THN (BLADE) IMPLANT
BNDG COHESIVE 4X5 TAN STRL (GAUZE/BANDAGES/DRESSINGS) IMPLANT
BNDG COHESIVE 6X5 TAN STRL LF (GAUZE/BANDAGES/DRESSINGS) IMPLANT
BNDG ESMARK 4X12 TAN STRL LF (GAUZE/BANDAGES/DRESSINGS) ×2 IMPLANT
BNDG GAUZE 4.5X4.1 6PLY STRL (MISCELLANEOUS) ×2 IMPLANT
CANISTER SUCT 1200ML W/VALVE (MISCELLANEOUS) ×2 IMPLANT
CANISTER SUCT 3000ML (MISCELLANEOUS) ×2 IMPLANT
CUFF TOURN 18 STER (MISCELLANEOUS) ×2 IMPLANT
CUFF TOURN DUAL PL 12 NO SLV (MISCELLANEOUS) IMPLANT
DRAPE FLUOR MINI C-ARM 54X84 (DRAPES) IMPLANT
DRAPE XRAY CASSETTE 23X24 (DRAPES) IMPLANT
DRESSING ALLEVYN 4X4 (MISCELLANEOUS) IMPLANT
DURAPREP 26ML APPLICATOR (WOUND CARE) ×2 IMPLANT
ELECT REM PT RETURN 9FT ADLT (ELECTROSURGICAL) ×2
ELECTRODE REM PT RTRN 9FT ADLT (ELECTROSURGICAL) ×1 IMPLANT
GAUZE PACKING 1/4X5YD (GAUZE/BANDAGES/DRESSINGS) IMPLANT
GAUZE PACKING IODOFORM 1X5 (MISCELLANEOUS) ×2 IMPLANT
GAUZE PETRO XEROFOAM 1X8 (MISCELLANEOUS) IMPLANT
GAUZE SPONGE 4X4 12PLY STRL (GAUZE/BANDAGES/DRESSINGS) ×2 IMPLANT
GAUZE STRETCH 2X75IN STRL (MISCELLANEOUS) IMPLANT
GLOVE BIO SURGEON STRL SZ7.5 (GLOVE) ×4 IMPLANT
GLOVE INDICATOR 8.0 STRL GRN (GLOVE) ×2 IMPLANT
GOWN STRL REUS W/TWL MED LVL3 (GOWN DISPOSABLE) ×4 IMPLANT
HANDPIECE INTERPULSE COAX TIP (DISPOSABLE)
HANDPIECE VERSAJET DEBRIDEMENT (MISCELLANEOUS) ×2 IMPLANT
IV NS 1000ML (IV SOLUTION) ×1
IV NS 1000ML BAXH (IV SOLUTION) ×1 IMPLANT
KIT RM TURNOVER STRD PROC AR (KITS) ×2 IMPLANT
LABEL OR SOLS (LABEL) ×2 IMPLANT
NEEDLE FILTER BLUNT 18X 1/2SAF (NEEDLE) ×1
NEEDLE FILTER BLUNT 18X1 1/2 (NEEDLE) ×1 IMPLANT
NEEDLE HYPO 25X1 1.5 SAFETY (NEEDLE) ×2 IMPLANT
NS IRRIG 500ML POUR BTL (IV SOLUTION) ×2 IMPLANT
PACK EXTREMITY ARMC (MISCELLANEOUS) ×2 IMPLANT
PAD ABD DERMACEA PRESS 5X9 (GAUZE/BANDAGES/DRESSINGS) ×2 IMPLANT
RASP SM TEAR CROSS CUT (RASP) IMPLANT
SET HNDPC FAN SPRY TIP SCT (DISPOSABLE) IMPLANT
SOL .9 NS 3000ML IRR  AL (IV SOLUTION) ×1
SOL .9 NS 3000ML IRR UROMATIC (IV SOLUTION) ×1 IMPLANT
SOL PREP PVP 2OZ (MISCELLANEOUS) ×2
SOLUTION PREP PVP 2OZ (MISCELLANEOUS) ×1 IMPLANT
STOCKINETTE IMPERVIOUS 9X36 MD (GAUZE/BANDAGES/DRESSINGS) ×2 IMPLANT
SUT ETHILON 2 0 FS 18 (SUTURE) ×2 IMPLANT
SUT ETHILON 4-0 (SUTURE)
SUT ETHILON 4-0 FS2 18XMFL BLK (SUTURE)
SUT VIC AB 3-0 SH 27 (SUTURE) ×1
SUT VIC AB 3-0 SH 27X BRD (SUTURE) ×1 IMPLANT
SUT VIC AB 4-0 FS2 27 (SUTURE) IMPLANT
SUTURE ETHLN 4-0 FS2 18XMF BLK (SUTURE) IMPLANT
SWAB CULTURE AMIES ANAERIB BLU (MISCELLANEOUS) ×2 IMPLANT
SYR 3ML LL SCALE MARK (SYRINGE) ×2 IMPLANT
SYRINGE 10CC LL (SYRINGE) IMPLANT

## 2016-10-13 NOTE — H&P (Signed)
[place] [facility] [place] NAME: Willie Krueger    MR#:  [id number]  DATE OF BIRTH:  [birth year]  DATE OF ADMISSION:  10/13/2016  PRIMARY CARE PHYSICIAN: [facility]   REQUESTING/REFERRING PHYSICIAN: Dr. Vickki Muff  CHIEF COMPLAINT: Diabetic foot infection on the left foot   No chief complaint on file.   HISTORY OF PRESENT ILLNESS:  Willie Krueger  is a 61 y.o. male with a known history of Hypertension, diabetes mellitus type 2, neuropathy, chronic pain, follows up with pain management sent in as a direct admit from [place] [facility]  because of diabetic nfection of the left foot,. Her Foot infection since 2 days. Patient did not have any  Injury to left foot. 2 started suddenly, associated with pus. Patient had flulike symptoms over the weekend than that he denies any complaints. Did not take diabetes medicine for the last 2 days due to 2 symptoms and decreased appetite.  PAST MEDICAL HISTORY:   Past Medical History:  Diagnosis Date  . Chronic pain   . Chronic pain associated with significant psychosocial dysfunction 05/04/2015   Last Assessment & Plan:  He is having worsening problems with breakthrough pain. Gabapentin is increased from 800 mg 3 times daily to 1200 mg 3 times daily. Oxycodone is also renewed. He continues to through evaluation at [facility] pain clinic.   . Diabetes mellitus without complication ([place])   . Heart murmur   . History of spinal stenosis 10/31/2015   C3-4 with severe central canal stenosis with small amount of myelomalacia present. Moderate to severe canal stenosis is also present at C4-5 and C5-6. Moderate to severe neuroforaminal narrowing between the levels of C2-C7  . Hyperlipidemia   . Hypertension   . Leg weakness 07/07/2014  . Mitral valve regurgitation   . Nerve root inflammation 06/26/2014   Last Assessment & Plan:  Status is unchanged with ongoing pain and limited mobility. Continue with physical therapy and  pain management.   . Osteoarthritis     PAST SURGICAL HISTOIRY:   Past Surgical History:  Procedure Laterality Date  . CARPAL TUNNEL RELEASE    . SPINE SURGERY      SOCIAL HISTORY:   Social History  Substance Use Topics  . Smoking status: Current Every Day Smoker    Packs/day: 1.50    Types: Cigarettes  . Smokeless tobacco: Never Used  . Alcohol use No    FAMILY HISTORY:   Family History  Problem Relation Age of Onset  . Cancer Mother   . Diabetes Mother   . Heart disease Father     DRUG ALLERGIES:   Allergies  Allergen Reactions  . Cleocin [Clindamycin] Rash  . Sulfa Antibiotics Rash    REVIEW OF SYSTEMS:  CONSTITUTIONAL: No fever, fatigue or weakness.  EYES: No blurred or double vision.  EARS, NOSE, AND THROAT: No tinnitus or ear pain.  RESPIRATORY: No cough, shortness of breath, wheezing or hemoptysis.  CARDIOVASCULAR: No chest pain, orthopnea, edema.  GASTROINTESTINAL: No nausea, vomiting, diarrhea or abdominal pain.  GENITOURINARY: No dysuria, hematuria.  ENDOCRINE: No polyuria, nocturia,  HEMATOLOGY: No anemia, easy bruising or bleeding SKIN: No rash or lesion. MUSCULOSKELETAL: chronic neck pains. Patient follows up with pain management specialist, had history of neck fusion, rod placement in the neck aches narcotics on a regular basis. NEUROLOGIC: No tingling, numbness, weakness.  PSYCHIATRY: No anxiety or depression.   MEDICATIONS AT HOME:   Prior to Admission medications   Medication Sig  Start Date End Date Taking? Authorizing Provider  acetaminophen (TYLENOL) 500 MG tablet Take 500 mg by mouth every 4 (four) hours. Scheduled (5 times daily)   Yes Historical Provider, MD  aspirin EC 81 MG tablet Take 81 mg by mouth daily.   Yes Historical Provider, MD  furosemide (LASIX) 20 MG tablet Take 40 mg by mouth 2 (two) times daily.    Yes Historical Provider, MD  gabapentin (NEURONTIN) 600 MG tablet Take 2 tablets (1,200 mg total) by mouth every 6 (six)  hours as needed. 08/11/16  Yes Milinda Pointer, MD  glimepiride (AMARYL) 2 MG tablet Take 2 mg by mouth daily with breakfast.    Yes Historical Provider, MD  ibuprofen (ADVIL,MOTRIN) 800 MG tablet Take by mouth.   Yes Historical Provider, MD  lisinopril (PRINIVIL,ZESTRIL) 40 MG tablet Take 40 mg by mouth daily. 03/08/16  Yes Historical Provider, MD  metFORMIN (GLUCOPHAGE-XR) 500 MG 24 hr tablet Take 500 mg by mouth 2 (two) times daily. 12/03/15  Yes Historical Provider, MD  oxyCODONE (ROXICODONE) 15 MG immediate release tablet Take 1 tablet (15 mg total) by mouth 5 (five) times daily as needed for pain. 08/11/16  Yes Milinda Pointer, MD  oxyCODONE (ROXICODONE) 15 MG immediate release tablet Take 1 tablet (15 mg total) by mouth 5 (five) times daily as needed for pain. 08/11/16  Yes Milinda Pointer, MD  pravastatin (PRAVACHOL) 20 MG tablet Take 20 mg by mouth at bedtime.  01/30/12  Yes Historical Provider, MD  Cholecalciferol (VITAMIN D3) 2000 units capsule Take 2,000 Units by mouth daily.  01/23/16   Historical Provider, MD  diphenhydramine-acetaminophen (TYLENOL PM) 25-500 MG TABS tablet Take 2 tablets by mouth at bedtime. Reported on 03/17/2016    Historical Provider, MD  oxyCODONE (ROXICODONE) 15 MG immediate release tablet Take 1 tablet (15 mg total) by mouth 5 (five) times daily as needed for pain. 08/11/16   Milinda Pointer, MD  sildenafil (VIAGRA) 100 MG tablet Take 100 mg by mouth daily as needed for erectile dysfunction.    Historical Provider, MD      VITAL SIGNS:  Blood pressure 137/81, pulse 95, temperature 98.3 F (36.8 C), temperature source Oral, resp. rate 20, SpO2 100 %.  PHYSICAL EXAMINATION:  GENERAL:  60 y.o.-year-old patient lying in the bed with no acute distress.  EYES: Pupils equal, round, reactive to light and accommodation. No scleral icterus. Extraocular muscles intact.  HEENT: Head atraumatic, normocephalic. Oropharynx and nasopharynx clear.  NECK:  Supple, no jugular  venous distention. No thyroid enlargement, no tenderness.  LUNGS: Normal breath sounds bilaterally, no wheezing, rales,rhonchi or crepitation. No use of accessory muscles of respiration.  CARDIOVASCULAR: S1, S2 normal. No murmurs, rubs, or gallops.  ABDOMEN: Soft, nontender, nondistended. Bowel sounds present. No organomegaly or mass.  EXTREMITIES: Left foot is wrapped with dressing. Right foot no evidence of infection, pulses present. 2+ pedal edema present bilaterally.Marland Kitchen  NEUROLOGIC: Cranial nerves II through XII are intact. Muscle strength 5/5 in all extremities. Sensation intact. Gait not checked.  PSYCHIATRIC: The patient is alert and oriented x 3.  SKIN: No obvious rash, lesion, or ulcer.   LABORATORY PANEL:   CBC No results for input(s): WBC, HGB, HCT, PLT in the last 168 hours. ------------------------------------------------------------------------------------------------------------------  Chemistries  No results for input(s): NA, K, CL, CO2, GLUCOSE, BUN, CREATININE, CALCIUM, MG, AST, ALT, ALKPHOS, BILITOT in the last 168 hours.  Invalid input(s): GFRCGP ------------------------------------------------------------------------------------------------------------------  Cardiac Enzymes No results for input(s): TROPONINI in the last 168 hours. ------------------------------------------------------------------------------------------------------------------  RADIOLOGY:  No results found.  EKG:   Orders placed or performed in visit on 01/01/14  . EKG 12-Lead    IMPRESSION AND PLAN:   #1. Left foot diabetic infection: Admitted to hospitalist service, started on IV vancomycin, Zosyn, discussed with Dr. Toy Cookey he recommended x-ray of the left foot, to evaluate for osteomyelitis keeping him nothing by mouth in case there is going to take the patient for incision and drainage. Continue IV hydration.  Pt had previous infection on the right great toe osteomyelitis: Received  vancomycin, ertapenem in March of this  Year. Check the patient's CBC, Chem-7, CRP, ESR. #2 diabetes mellitus type 2: Continue metformin, glyburide, start insulin with sliding scale coverage #3. Pain syndrome: History of spinal stenosis: Follows up with pain management, takes oxycodone 5 mg every 6 hours. #4 essential hypertension: Continue lisinopril. History of neuropathy: Continue Neurontin.     All the records are reviewed and case discussed with ED provider. Management plans discussed with the patient, family and they are in agreement.  CODE STATUS: full  TOTAL TIME TAKING CARE OF THIS PATIENT: 88mnutes.    KEpifanio LeschesM.D on 10/13/2016 at 3:53 PM  Between 7am to 6pm - Pager - [phone]  After 6pm go to [email or web address] - password [email or web address]  Office  [phone] CC: Primary care physician; [facility]  Note: This dictation was prepared with Dragon dictation along with smaller phrase technology. Any transcriptional errors that result from this process are unintentional.

## 2016-10-13 NOTE — Consult Note (Addendum)
ORTHOPAEDIC CONSULTATION  REQUESTING PHYSICIAN: Epifanio Lesches, MD  Chief Complaint: Diabetic left foot infection  HPI: Willie Krueger is a 60 y.o. male who complains of  Left foot infection.  Presented in outpt clinic with worsening infection that began over the weekend.  Very red with purulent drainage.    Past Medical History:  Diagnosis Date  . Chronic pain   . Chronic pain associated with significant psychosocial dysfunction 05/04/2015   Last Assessment & Plan:  He is having worsening problems with breakthrough pain. Gabapentin is increased from 800 mg 3 times daily to 1200 mg 3 times daily. Oxycodone is also renewed. He continues to through evaluation at [facility] pain clinic.   . Diabetes mellitus without complication ([place])   . Heart murmur   . History of spinal stenosis 10/31/2015   C3-4 with severe central canal stenosis with small amount of myelomalacia present. Moderate to severe canal stenosis is also present at C4-5 and C5-6. Moderate to severe neuroforaminal narrowing between the levels of C2-C7  . Hyperlipidemia   . Hypertension   . Leg weakness 07/07/2014  . Mitral valve regurgitation   . Nerve root inflammation 06/26/2014   Last Assessment & Plan:  Status is unchanged with ongoing pain and limited mobility. Continue with physical therapy and pain management.   . Osteoarthritis    Past Surgical History:  Procedure Laterality Date  . CARPAL TUNNEL RELEASE    . SPINE SURGERY     Social History   Social History  . Marital status: Married    Spouse name: N/A  . Number of children: N/A  . Years of education: N/A   Social History Main Topics  . Smoking status: Current Every Day Smoker    Packs/day: 1.50    Types: Cigarettes  . Smokeless tobacco: Never Used  . Alcohol use No  . Drug use: No  . Sexual activity: Yes    Partners: Female   Other Topics Concern  . Not on file   Social History Narrative  . No narrative on file   Family History  Problem  Relation Age of Onset  . Cancer Mother   . Diabetes Mother   . Heart disease Father    Allergies  Allergen Reactions  . Cleocin [Clindamycin] Rash  . Sulfa Antibiotics Rash   Prior to Admission medications   Medication Sig Start Date End Date Taking? Authorizing Provider  acetaminophen (TYLENOL) 500 MG tablet Take 500 mg by mouth every 4 (four) hours. Scheduled (5 times daily)   Yes Historical Provider, MD  aspirin EC 81 MG tablet Take 81 mg by mouth daily.   Yes Historical Provider, MD  furosemide (LASIX) 20 MG tablet Take 40 mg by mouth 2 (two) times daily.    Yes Historical Provider, MD  gabapentin (NEURONTIN) 600 MG tablet Take 2 tablets (1,200 mg total) by mouth every 6 (six) hours as needed. 08/11/16  Yes Milinda Pointer, MD  glimepiride (AMARYL) 2 MG tablet Take 2 mg by mouth daily with breakfast.    Yes Historical Provider, MD  ibuprofen (ADVIL,MOTRIN) 800 MG tablet Take by mouth.   Yes Historical Provider, MD  lisinopril (PRINIVIL,ZESTRIL) 40 MG tablet Take 40 mg by mouth daily. 03/08/16  Yes Historical Provider, MD  metFORMIN (GLUCOPHAGE-XR) 500 MG 24 hr tablet Take 500 mg by mouth 2 (two) times daily. 12/03/15  Yes Historical Provider, MD  oxyCODONE (ROXICODONE) 15 MG immediate release tablet Take 1 tablet (15 mg total) by mouth 5 (five) times daily  as needed for pain. 08/11/16  Yes Milinda Pointer, MD  oxyCODONE (ROXICODONE) 15 MG immediate release tablet Take 1 tablet (15 mg total) by mouth 5 (five) times daily as needed for pain. 08/11/16  Yes Milinda Pointer, MD  pravastatin (PRAVACHOL) 20 MG tablet Take 20 mg by mouth at bedtime.  01/30/12  Yes Historical Provider, MD  Cholecalciferol (VITAMIN D3) 2000 units capsule Take 2,000 Units by mouth daily.  01/23/16   Historical Provider, MD  diphenhydramine-acetaminophen (TYLENOL PM) 25-500 MG TABS tablet Take 2 tablets by mouth at bedtime. Reported on 03/17/2016    Historical Provider, MD  oxyCODONE (ROXICODONE) 15 MG immediate  release tablet Take 1 tablet (15 mg total) by mouth 5 (five) times daily as needed for pain. 08/11/16   Milinda Pointer, MD  sildenafil (VIAGRA) 100 MG tablet Take 100 mg by mouth daily as needed for erectile dysfunction.    Historical Provider, MD   No results found.  Positive ROS: All other systems have been reviewed and were otherwise negative with the exception of those mentioned in the HPI and as above.  12 point ROS was performed.  Physical Exam: General: Alert and oriented.  No apparent distress.  Vascular:  Left foot:Dorsalis Pedis:  present Posterior Tibial:  diminished  Right foot: Dorsalis Pedis:  present Posterior Tibial:  diminished  Neuro:absent protective sensation  Derm:Left foot with severe erythema and edema with purulence to great toe.  Ortho/MS: Severe edema left foot with marked focal edema to great toe   Assessment: Severe diabetic foot infection   Plan: Will plan to I & D this evening.  NPO for now.  Last ate at 11:30 today.   D/W pt r/b/a/c and pt has given consent. Xray to foot today. Culture intra-op.    Elesa Hacker, DPM Cell [phone]   10/13/2016 4:45 PM

## 2016-10-13 NOTE — Progress Notes (Signed)
Pharmacy Antibiotic Note  Willie Krueger is a 60 y.o. male admitted on 10/13/2016 with diabetic foot wound/infection.  Pharmacy has been consulted for vancomycin and piperacillin/tazobactam dosing.  Plan: Piperacillin/tazobactam 3.375 g IV q8h EI  Vancomycin 1000 mg dose ordered for 1700 this evening but has not been administered. Spoke with RN - patient is currently off floor for procedure.  Once initial dose is given, will start vancomycin 1250 mg IV q12h (6 hour stacked dosing) Vancomycin trough goal 15-20 mcg/mL Will need to follow up and order vancomycin trough prior to 5th dose  Kinetics: Using adjusted body weight = 89 kg Ke: 0.081 Half-life: 8.5 hrs Vd: 62 L  Cmin (calculated) ~15 mcg/mL  Height: 6\' 2"  (188 cm) Weight: 218 lb (98.9 kg) IBW/kg (Calculated) : 82.2  Temp (24hrs), Avg:99.1 F (37.3 C), Min:98.3 F (36.8 C), Max:99.9 F (37.7 C)   Recent Labs Lab 10/13/16 1848  CREATININE 1.06    Estimated Creatinine Clearance: 92 mL/min (by C-G formula based on SCr of 1.06 mg/dL).    Allergies  Allergen Reactions  . Cleocin [Clindamycin] Rash  . Sulfa Antibiotics Rash    Antimicrobials this admission: vancomycin  Piperacillin/tazobactam 10/16 >>   Dose adjustments this admission:  Microbiology results: 10/16 MRSA PCR: Pending  Thank you for allowing pharmacy to be a part of this patient's care.  Lenis Noon, PharmD Clinical Pharmacist 10/13/2016 8:51 PM

## 2016-10-13 NOTE — Care Management (Addendum)
[facility] able to provide home IV antibiotics to this patient if needed. If they provide IV therapy then they can cover patient for [facility] and HHPT (if needed). If no IV therapy is needed this will limit home health availability as most agencies are out of network with [employer] here in [place].

## 2016-10-13 NOTE — Op Note (Signed)
Operative note   Surgeon:Antoinette Haskett Lawyer: None    Preop diagnosis: Gas producing abscess left great toe and MTPJ.  Os myelitis distal left great toe    Postop diagnosis: Same    Procedure: 1.Amputation distal interphalangeal joint left great toe 2. Gas producing abscess with cellulitis first metatarsophalangeal joint   EBL: Minimal    Anesthesia:local and general    Hemostasis: [place] tourniquet inflated to 200 mmHg for approximately 25 minutes    Specimen: Deep culture and osteomyelitis left great toe    Complications: None    Operative indications:Willie Krueger is an 60 y.o. that presents today for surgical intervention.  The risks/benefits/alternatives/complications have been discussed and consent has been given.    Procedure:  Patient was brought into the OR and placed on the operating table in thesupine position. After anesthesia was obtained theleft lower extremity was prepped and draped in usual sterile fashion.  Attention was directed to the dorsal aspect of the left foot where a longitudinal incision was made from the distal aspect of the left great toe extending to the metatarsophalangeal joint region. Sharp and blunt dissection was carried down to the long extensor tendon. At this point initial dissection was taken back proximal to the metatarsophalangeal joint where gas producing infection and purulent drainage was noted. This was taken back approximately to the mid shaft of the first metatarsal. To be ulcerative lesion on the distal aspect of left great toe. This was debrided away initially and probe directly down to bone on the distal aspect. The nail was avulsed and underlying bone was inspected. There was noted to be eroded bone on the distal aspect of left great toe. This point the distal phalanx of the left great toe was disarticulated and removed from the surgical field in toto. There was noted to be dark discoloration with necrotic and on the distal aspect  of the proximal phalanx and this was then removed. The distal one third of the proximal phalanx was excised with a saw. This time further dissection of all necrotic skin and tissue was removed. The area was then flushed with copious amounts of saline and the underlying subcutaneous tissue was excisionally debrided with a versa jet. All areas then probed and no further purulent drainage was noted. [place] aspect of the initially closed with a 3-0 nylon with the most proximal aspect left open for packing. The distal skin was further contoured and closed primarily with 3-0 nylon. The wound was packed with iodoform dressing. In a bulky sterile dressing was then applied.    Patient tolerated the procedure and anesthesia well.  Was transported from the OR to the PACU with all vital signs stable and vascular status intact. Marland Kitchen

## 2016-10-13 NOTE — Anesthesia Preprocedure Evaluation (Signed)
Anesthesia Evaluation  Patient identified by MRN, date of birth, ID band Patient awake    Reviewed: Allergy & Precautions, H&P , NPO status , Patient's Chart, lab work & pertinent test results, reviewed documented beta blocker date and time   Airway Mallampati: II  TM Distance: >3 FB Neck ROM: full    Dental  (+) Teeth Intact   Pulmonary neg pulmonary ROS, Current Smoker,    Pulmonary exam normal        Cardiovascular Exercise Tolerance: Good hypertension, negative cardio ROS Normal cardiovascular exam Rate:Normal     Neuro/Psych negative neurological ROS  negative psych ROS   GI/Hepatic negative GI ROS, Neg liver ROS, (+) Hepatitis -  Endo/Other  negative endocrine ROSdiabetes  Renal/GU negative Renal ROS  negative genitourinary   Musculoskeletal   Abdominal   Peds  Hematology negative hematology ROS (+)   Anesthesia Other Findings   Reproductive/Obstetrics negative OB ROS                             Anesthesia Physical Anesthesia Plan  ASA: II  Anesthesia Plan: General LMA   Post-op Pain Management:    Induction:   Airway Management Planned:   Additional Equipment:   Intra-op Plan:   Post-operative Plan:   Informed Consent: I have reviewed the patients History and Physical, chart, labs and discussed the procedure including the risks, benefits and alternatives for the proposed anesthesia with the patient or authorized representative who has indicated his/her understanding and acceptance.     Plan Discussed with: CRNA  Anesthesia Plan Comments:         Anesthesia Quick Evaluation

## 2016-10-13 NOTE — Transfer of Care (Signed)
Immediate Anesthesia Transfer of Care Note  Patient: Willie Krueger  Procedure(s) Performed: Procedure(s): IRRIGATION AND DEBRIDEMENT FOOT (Left)  Patient Location: PACU  Anesthesia Type:General  Level of Consciousness: awake, alert  and oriented  Airway & Oxygen Therapy: Patient Spontanous Breathing  Post-op Assessment: Report given to RN  Post vital signs: Reviewed and stable  Last Vitals:  Vitals:   10/13/16 1919 10/13/16 2254  BP: 129/68   Pulse: 89   Resp: 19   Temp: 37.7 C (P) 36.1 C    Last Pain:  Vitals:   10/13/16 1959  TempSrc:   PainSc: 0-No pain         Complications: No apparent anesthesia complications

## 2016-10-13 NOTE — Anesthesia Procedure Notes (Signed)
Procedure Name: LMA Insertion Date/Time: 10/13/2016 9:47 PM Performed by: [id number], Arnesha Schiraldi Pre-anesthesia Checklist: Timeout performed, Patient being monitored, Suction available, Emergency Drugs available and Patient identified Patient Re-evaluated:Patient Re-evaluated prior to inductionOxygen Delivery Method: Circle system utilized Preoxygenation: Pre-oxygenation with 100% oxygen Intubation Type: IV induction LMA: LMA inserted LMA Size: 5.0 Number of attempts: 1 Placement Confirmation: breath sounds checked- equal and bilateral,  CO2 detector and positive ETCO2

## 2016-10-14 ENCOUNTER — Ambulatory Visit: Payer: 59 | Admitting: Pain Medicine

## 2016-10-14 ENCOUNTER — Telehealth: Payer: Self-pay

## 2016-10-14 LAB — BASIC METABOLIC PANEL
Anion gap: 6 (ref 5–15)
BUN: 19 mg/dL (ref 6–20)
CO2: 28 mmol/L (ref 22–32)
Calcium: 8.7 mg/dL — ABNORMAL LOW (ref 8.9–10.3)
Chloride: 102 mmol/L (ref 101–111)
Creatinine, Ser: 1.13 mg/dL (ref 0.61–1.24)
GFR calc Af Amer: >60 mL/min (ref >60)
GFR calc non Af Amer: >60 mL/min (ref >60)
Glucose, Bld: 215 mg/dL — ABNORMAL HIGH (ref 65–99)
Potassium: 4.2 mmol/L (ref 3.5–5.1)
Sodium: 136 mmol/L (ref 135–145)

## 2016-10-14 LAB — GLUCOSE, CAPILLARY
GLUCOSE-CAPILLARY: 234 mg/dL — AB (ref 65–99)
GLUCOSE-CAPILLARY: 207 mg/dL — AB (ref 65–99)
GLUCOSE-CAPILLARY: 114 mg/dL — AB (ref 65–99)
Glucose-Capillary: 262 mg/dL — ABNORMAL HIGH (ref 65–99)

## 2016-10-14 LAB — CBC
HCT: 38.6 % — ABNORMAL LOW (ref 40.0–52.0)
Hemoglobin: 12.7 g/dL — ABNORMAL LOW (ref 13.0–18.0)
MCH: 32.1 pg (ref 26.0–34.0)
MCHC: 32.8 g/dL (ref 32.0–36.0)
MCV: 98 fL (ref 80.0–100.0)
Platelets: 212 10*3/uL (ref 150–440)
RBC: 3.94 MIL/uL — ABNORMAL LOW (ref 4.40–5.90)
RDW: 14 % (ref 11.5–14.5)
WBC: 13.6 10*3/uL — ABNORMAL HIGH (ref 3.8–10.6)

## 2016-10-14 MED ORDER — VANCOMYCIN HCL IN DEXTROSE 1-5 GM/200ML-% IV SOLN
1000.0000 mg | Freq: Once | INTRAVENOUS | Status: AC
Start: 2016-10-14 — End: 2016-10-14
  Administered 2016-10-14: 1000 mg via INTRAVENOUS

## 2016-10-14 MED ORDER — ATORVASTATIN CALCIUM 20 MG PO TABS
40.0000 mg | ORAL_TABLET | Freq: Every day | ORAL | Status: DC
  Administered 2016-10-14 – 2016-10-15 (×2): 40 mg via ORAL
  Filled 2016-10-14: qty 2

## 2016-10-14 NOTE — Progress Notes (Signed)
Willie Krueger at [place] NAME: Willie Krueger    MR#:  [id number]  DATE OF BIRTH:  1956/10/08  SUBJECTIVE:  CHIEF COMPLAINT:  Patient is resting comfortably. Pain is manageable. No new concerns about his left foot. Has chronic low back pain  REVIEW OF SYSTEMS:  CONSTITUTIONAL: No fever, fatigue or weakness.  EYES: No blurred or double vision.  EARS, NOSE, AND THROAT: No tinnitus or ear pain.  RESPIRATORY: No cough, shortness of breath, wheezing or hemoptysis.  CARDIOVASCULAR: No chest pain, orthopnea, edema.  GASTROINTESTINAL: No nausea, vomiting, diarrhea or abdominal pain.  GENITOURINARY: No dysuria, hematuria.  ENDOCRINE: No polyuria, nocturia,  HEMATOLOGY: No anemia, easy bruising or bleeding SKIN: No rash or lesion. MUSCULOSKELETAL: Has chronic low back pain No joint pain or arthritis.   NEUROLOGIC: No tingling, numbness, weakness.  PSYCHIATRY: No anxiety or depression.   DRUG ALLERGIES:   Allergies  Allergen Reactions  . Cleocin [Clindamycin] Rash  . Sulfa Antibiotics Rash    VITALS:  Blood pressure 112/69, pulse 71, temperature 98.3 F (36.8 C), temperature source Oral, resp. rate 18, height 6\' 2"  (1.88 m), weight 99.6 kg (219 lb 8 oz), SpO2 98 %.  PHYSICAL EXAMINATION:  GENERAL:  60 y.o.-year-old patient lying in the bed with no acute distress.  EYES: Pupils equal, round, reactive to light and accommodation. No scleral icterus. Extraocular muscles intact.  HEENT: Head atraumatic, normocephalic. Oropharynx and nasopharynx clear.  NECK:  Supple, no jugular venous distention. No thyroid enlargement, no tenderness.  LUNGS: Normal breath sounds bilaterally, no wheezing, rales,rhonchi or crepitation. No use of accessory muscles of respiration.  CARDIOVASCULAR: S1, S2 normal. No murmurs, rubs, or gallops.  ABDOMEN: Soft, nontender, nondistended. Bowel sounds present. No organomegaly or mass.  EXTREMITIES:Left foot in clean  dressing postop day #1 No pedal edema, cyanosis, or clubbing.  NEUROLOGIC: Cranial nerves II through XII are intact. Muscle strength 5/5 in all extremities. Sensation intact. Gait not checked.  PSYCHIATRIC: The patient is alert and oriented x 3.  SKIN: No obvious rash, lesion, or ulcer.    LABORATORY PANEL:   CBC  Recent Labs Lab 10/14/16 0348  WBC 13.6*  HGB 12.7*  HCT 38.6*  PLT 212   ------------------------------------------------------------------------------------------------------------------  Chemistries   Recent Labs Lab 10/14/16 0348  NA 136  K 4.2  CL 102  CO2 28  GLUCOSE 215*  BUN 19  CREATININE 1.13  CALCIUM 8.7*   ------------------------------------------------------------------------------------------------------------------  Cardiac Enzymes No results for input(s): TROPONINI in the last 168 hours. ------------------------------------------------------------------------------------------------------------------  RADIOLOGY:  Dg Foot Complete Left  Result Date: 10/13/2016 CLINICAL DATA:  Foot infection.  Pain EXAM: LEFT FOOT - COMPLETE 3+ VIEW COMPARISON:  None. FINDINGS: Soft tissue swelling of the great toe. Bony erosion of the tuft of the distal first phalanx compatible with osteomyelitis. There is gas in the soft tissues of the great toe dorsally. Pes planus. Negative for fracture. Mild degenerative change in the midfoot. IMPRESSION: Osteomyelitis of the first distal phalanx. Electronically Signed   By: Franchot Gallo M.D.   On: 10/13/2016 19:15    EKG:   Orders placed or performed in visit on 01/01/14  . EKG 12-Lead    ASSESSMENT AND PLAN:    1. Left foot diabetic infection: Status post surgery postop day #1 Continue on IV vancomycin, Zosyn. 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 Alvera Krueger recommendations  #2 diabetes mellitus type 2:  Continue metformin,  glyburide,  insulin  sliding scale coverage  #3.  chronic Pain syndrome: History of spinal stenosis: Follows up with pain management, takes oxycodone 5 mg every 6 hours.  #4 essential hypertension: Continue lisinopril.  History of neuropathy: Continue Neurontin.    All the records are reviewed and case discussed with Care Management/Social Workerr. Management plans discussed with the patient, family and they are in agreement.  CODE STATUS: fc  TOTAL TIME TAKING CARE OF THIS PATIENT: 36 minutes.   POSSIBLE D/C IN 2 DAYS, DEPENDING ON CLINICAL CONDITION.  Note: This dictation was prepared with Dragon dictation along with smaller phrase technology. Any transcriptional errors that result from this process are unintentional.   Willie Krueger M.D on 10/14/2016 at 3:53 PM  Between 7am to 6pm - Pager - [phone] After 6pm go to [email or web address] - password [email or web address] [facility]  Office  [phone]  CC: Primary care physician; [place]

## 2016-10-14 NOTE — Progress Notes (Signed)
Pharmacy Antibiotic Note  Willie Krueger is a 60 y.o. male admitted on 10/13/2016 with diabetic foot wound/infection.  Pharmacy has been consulted for vancomycin and piperacillin/tazobactam dosing.  Plan: Piperacillin/tazobactam 3.375 g IV q8h EI  Vancomycin 1000 mg dose ordered for 1700 this evening but has not been administered. Spoke with RN - patient is currently off floor for procedure.  Once initial dose is given, will start vancomycin 1250 mg IV q12h (6 hour stacked dosing) Vancomycin trough goal 15-20 mcg/mL Will need to follow up and order vancomycin trough prior to 5th dose  10/17 00:30. Vanc 1x dose and subsequent doses rescheduled. Level ordered before 5th dose.  Kinetics: Using adjusted body weight = 89 kg Ke: 0.081 Half-life: 8.5 hrs Vd: 62 L  Cmin (calculated) ~15 mcg/mL  Height: 6\' 2"  (188 cm) Weight: 218 lb (98.9 kg) IBW/kg (Calculated) : 82.2  Temp (24hrs), Avg:98.6 F (37 C), Min:97 F (36.1 C), Max:99.9 F (37.7 C)   Recent Labs Lab 10/13/16 1848  CREATININE 1.06    Estimated Creatinine Clearance: 92 mL/min (by C-G formula based on SCr of 1.06 mg/dL).    Allergies  Allergen Reactions  . Cleocin [Clindamycin] Rash  . Sulfa Antibiotics Rash    Antimicrobials this admission: vancomycin  Piperacillin/tazobactam 10/16 >>   Dose adjustments this admission:  Microbiology results: 10/16 MRSA PCR: Pending  Thank you for allowing pharmacy to be a part of this patient's care.  Eloise Harman, PharmD Clinical Pharmacist 10/14/2016 12:27 AM

## 2016-10-14 NOTE — Telephone Encounter (Signed)
Dr Naveira notified.  

## 2016-10-14 NOTE — Telephone Encounter (Signed)
Patient is scheduled for RF today at 1040 but he has been admitted to the hospital with an infection in his toe. He is not sure if he will make it today and just wanted to let Dr. Dossie Arbour know.

## 2016-10-14 NOTE — Progress Notes (Signed)
Daily Progress Note   Subjective  - 1 Day Post-Op  F/u left great toe I & D with partial amputation.  Mild pain.    Objective Vitals:   10/14/16 0418 10/14/16 0423 10/14/16 1158 10/14/16 1649  BP: 110/61  112/69 125/76  Pulse: 73  71 70  Resp: 19  18 18   Temp: 98.9 F (37.2 C)  98.3 F (36.8 C) 98.4 F (36.9 C)  TempSrc: Oral  Oral Oral  SpO2: 99%  98% 97%  Weight:  99.6 kg (219 lb 8 oz)    Height:        Physical Exam: Incision is stable.  No active purulence.  Skin flaps perfused distally.  Moderate erythema on distal lateral incision.  Laboratory CBC    Component Value Date/Time   WBC 13.6 (H) 10/14/2016 0348   HGB 12.7 (L) 10/14/2016 0348   HGB 13.6 01/01/2014 2238   HCT 38.6 (L) 10/14/2016 0348   HCT 40.2 01/01/2014 2238   PLT 212 10/14/2016 0348   PLT 170 01/01/2014 2238    BMET    Component Value Date/Time   NA 136 10/14/2016 0348   NA 138 01/01/2014 2238   K 4.2 10/14/2016 0348   K 4.4 01/01/2014 2238   CL 102 10/14/2016 0348   CL 109 (H) 01/01/2014 2238   CO2 28 10/14/2016 0348   CO2 24 01/01/2014 2238   GLUCOSE 215 (H) 10/14/2016 0348   GLUCOSE 140 (H) 01/01/2014 2238   BUN 19 10/14/2016 0348   BUN 15 01/01/2014 2238   CREATININE 1.13 10/14/2016 0348   CREATININE 1.02 02/20/2016 1217   CALCIUM 8.7 (L) 10/14/2016 0348   CALCIUM 8.6 01/01/2014 2238   GFRNONAA >60 10/14/2016 0348   GFRNONAA >60 01/01/2014 2238   GFRAA >60 10/14/2016 0348   GFRAA >60 01/01/2014 2238    Assessment/Planning: Pt with recent gas producing infection with osteomyelitis great toe.   S/P partial great toe amputation.   Dressing changed.  Consulted ID for IV antibiotics.  Suspect can d/c in 1-2 once culture returns   Will follow.    Willie Krueger A  10/14/2016, 5:06 PM

## 2016-10-14 NOTE — Progress Notes (Signed)
Pharmacy Antibiotic Note  Willie Krueger is a 60 y.o. male admitted on 10/13/2016 with diabetic foot wound/infection.  Pharmacy has been consulted for vancomycin and piperacillin/tazobactam dosing.  Plan: Piperacillin/tazobactam 3.375 g IV q8h EI Vanc 1250 mg IV q 12 hours Pt received vanc 1000 mg IV x 1 dose on 10/17 (stacked dosing) Vancomycin trough goal 15-20 mcg/mL Vanc trough is ordered prior to 5th dose (10/18 @1930 )  Kinetics: Using adjusted body weight = 89 kg Ke: 0.081 Half-life: 8.5 hrs Vd: 62 L  Cmin (calculated) ~15 mcg/mL  Height: 6\' 2"  (188 cm) Weight: 219 lb 8 oz (99.6 kg) IBW/kg (Calculated) : 82.2  Temp (24hrs), Avg:98.5 F (36.9 C), Min:97 F (36.1 C), Max:99.9 F (37.7 C)   Recent Labs Lab 10/13/16 1848 10/14/16 0348  WBC  --  13.6*  CREATININE 1.06 1.13    Estimated Creatinine Clearance: 86.6 mL/min (by C-G formula based on SCr of 1.13 mg/dL).    Allergies  Allergen Reactions  . Cleocin [Clindamycin] Rash  . Sulfa Antibiotics Rash    Antimicrobials this admission: vancomycin >> Piperacillin/tazobactam 10/16 >>  Cefazolin x 1  Dose adjustments this admission:  Microbiology results: 10/16 Wound Cx: Pending 10/16 MRSA PCR: Negative  Thank you for allowing pharmacy to be a part of this patient's care.  Darrow Bussing, PharmD Pharmacy Resident 10/14/2016 1:12 PM

## 2016-10-15 ENCOUNTER — Encounter: Payer: Self-pay | Admitting: Podiatry

## 2016-10-15 LAB — CBC WITH DIFFERENTIAL/PLATELET
BASOS ABS: 0 10*3/uL (ref 0–0.1)
Basophils Relative: 0 %
Eosinophils Absolute: 0.2 10*3/uL (ref 0–0.7)
Eosinophils Relative: 2 %
HEMATOCRIT: 37.5 % — AB (ref 40.0–52.0)
HEMOGLOBIN: 12.7 g/dL — AB (ref 13.0–18.0)
LYMPHS ABS: 1.7 10*3/uL (ref 1.0–3.6)
LYMPHS PCT: 17 %
MCH: 32.9 pg (ref 26.0–34.0)
MCHC: 33.7 g/dL (ref 32.0–36.0)
MCV: 97.7 fL (ref 80.0–100.0)
Monocytes Absolute: 1 10*3/uL (ref 0.2–1.0)
Monocytes Relative: 10 %
NEUTROS ABS: 7 10*3/uL — AB (ref 1.4–6.5)
NEUTROS PCT: 71 %
PLATELETS: 258 10*3/uL (ref 150–440)
RBC: 3.84 MIL/uL — AB (ref 4.40–5.90)
RDW: 13.9 % (ref 11.5–14.5)
WBC: 10 10*3/uL (ref 3.8–10.6)

## 2016-10-15 LAB — BASIC METABOLIC PANEL
ANION GAP: 6 (ref 5–15)
BUN: 14 mg/dL (ref 6–20)
CHLORIDE: 107 mmol/L (ref 101–111)
CO2: 25 mmol/L (ref 22–32)
Calcium: 8.7 mg/dL — ABNORMAL LOW (ref 8.9–10.3)
Creatinine, Ser: 0.89 mg/dL (ref 0.61–1.24)
GFR calc Af Amer: >60 mL/min (ref >60)
GLUCOSE: 157 mg/dL — AB (ref 65–99)
POTASSIUM: 4.4 mmol/L (ref 3.5–5.1)
Sodium: 138 mmol/L (ref 135–145)

## 2016-10-15 LAB — GLUCOSE, CAPILLARY
GLUCOSE-CAPILLARY: 212 mg/dL — AB (ref 65–99)
GLUCOSE-CAPILLARY: 121 mg/dL — AB (ref 65–99)
Glucose-Capillary: 164 mg/dL — ABNORMAL HIGH (ref 65–99)

## 2016-10-15 LAB — VANCOMYCIN, TROUGH: Vancomycin Tr: 18 ug/mL (ref 15–20)

## 2016-10-15 MED ORDER — DIPHENHYDRAMINE HCL 25 MG PO CAPS
25.0000 mg | ORAL_CAPSULE | Freq: Every evening | ORAL | Status: DC | PRN
  Administered 2016-10-16: 25 mg via ORAL
  Filled 2016-10-15: qty 1

## 2016-10-15 NOTE — Progress Notes (Signed)
Dr. Jannifer [place] notified of patient's request for Tylenol PM as at home. New order written. Barbaraann Faster, RN 11:43 PM  10/15/2016

## 2016-10-15 NOTE — Progress Notes (Signed)
Pharmacy Antibiotic Note  Willie Krueger is a 60 y.o. male admitted on 10/13/2016 with diabetic foot wound/infection.  Pharmacy has been consulted for vancomycin and piperacillin/tazobactam dosing.  Plan: Piperacillin/tazobactam 3.375 g IV q8h EI Vanc 1250 mg IV q 12 hours Pt received vanc 1000 mg IV x 1 dose on 10/17 (stacked dosing) Vancomycin trough goal 15-20 mcg/mL Vanc trough is ordered prior to 5th dose (10/18 @1930 )  Kinetics: Using adjusted body weight = 89 kg Ke: 0.081 Half-life: 8.5 hrs Vd: 62 L  Cmin (calculated) ~15 mcg/mL  10/18:  VT @ 19:20 = 18 mcg/mL  Will continue this pt on Vanc 1250 mg IV Q12H.   Height: 6\' 2"  (188 cm) Weight: 224 lb 6.4 oz (101.8 kg) IBW/kg (Calculated) : 82.2  Temp (24hrs), Avg:98 F (36.7 C), Min:97.9 F (36.6 C), Max:98.1 F (36.7 C)   Recent Labs Lab 10/13/16 1848 10/14/16 0348 10/15/16 0446 10/15/16 1920  WBC  --  13.6* 10.0  --   CREATININE 1.06 1.13 0.89  --   VANCOTROUGH  --   --   --  18    Estimated Creatinine Clearance: 111 mL/min (by C-G formula based on SCr of 0.89 mg/dL).    Allergies  Allergen Reactions  . Cleocin [Clindamycin] Rash  . Sulfa Antibiotics Rash    Antimicrobials this admission: vancomycin >> Piperacillin/tazobactam 10/16 >>  Cefazolin x 1  Dose adjustments this admission:  Microbiology results: 10/16 Wound Cx: Pending 10/16 MRSA PCR: Negative  Thank you for allowing pharmacy to be a part of this patient's care.  [place] Resident 10/15/2016 9:28 PM

## 2016-10-15 NOTE — Progress Notes (Signed)
[facility] at [place] NAME: Willie Krueger    MR#:  [id number]  DATE OF BIRTH:  March 05, 1956  SUBJECTIVE:  CHIEF COMPLAINT:  Patient is resting comfortably. Pain is manageable. No new concerns   REVIEW OF SYSTEMS:  CONSTITUTIONAL: No fever, fatigue or weakness.  EYES: No blurred or double vision.  EARS, NOSE, AND THROAT: No tinnitus or ear pain.  RESPIRATORY: No cough, shortness of breath, wheezing or hemoptysis.  CARDIOVASCULAR: No chest pain, orthopnea, edema.  GASTROINTESTINAL: No nausea, vomiting, diarrhea or abdominal pain.  GENITOURINARY: No dysuria, hematuria.  ENDOCRINE: No polyuria, nocturia,  HEMATOLOGY: No anemia, easy bruising or bleeding SKIN: No rash or lesion. MUSCULOSKELETAL: Has chronic low back pain No joint pain or arthritis.   NEUROLOGIC: No tingling, numbness, weakness.  PSYCHIATRY: No anxiety or depression.   DRUG ALLERGIES:   Allergies  Allergen Reactions  . Cleocin [Clindamycin] Rash  . Sulfa Antibiotics Rash    VITALS:  Blood pressure 134/78, pulse 70, temperature 98.1 F (36.7 C), temperature source Oral, resp. rate 16, height 6\' 2"  (1.88 m), weight 101.8 kg (224 lb 6.4 oz), SpO2 98 %.  PHYSICAL EXAMINATION:  GENERAL:  60 y.o.-year-old patient lying in the bed with no acute distress.  EYES: Pupils equal, round, reactive to light and accommodation. No scleral icterus. Extraocular muscles intact.  HEENT: Head atraumatic, normocephalic. Oropharynx and nasopharynx clear.  NECK:  Supple, no jugular venous distention. No thyroid enlargement, no tenderness.  LUNGS: Normal breath sounds bilaterally, no wheezing, rales,rhonchi or crepitation. No use of accessory muscles of respiration.  CARDIOVASCULAR: S1, S2 normal. No murmurs, rubs, or gallops.  ABDOMEN: Soft, nontender, nondistended. Bowel sounds present. No organomegaly or mass.  EXTREMITIES:Left foot in clean dressing postop day #1 No pedal edema,  cyanosis, or clubbing.  NEUROLOGIC: Cranial nerves II through XII are intact. Muscle strength 5/5 in all extremities. Sensation intact. Gait not checked.  PSYCHIATRIC: The patient is alert and oriented x 3.  SKIN: No obvious rash, lesion, or ulcer.    LABORATORY PANEL:   CBC  Recent Labs Lab 10/15/16 0446  WBC 10.0  HGB 12.7*  HCT 37.5*  PLT 258   ------------------------------------------------------------------------------------------------------------------  Chemistries   Recent Labs Lab 10/15/16 0446  NA 138  K 4.4  CL 107  CO2 25  GLUCOSE 157*  BUN 14  CREATININE 0.89  CALCIUM 8.7*   ------------------------------------------------------------------------------------------------------------------  Cardiac Enzymes No results for input(s): TROPONINI in the last 168 hours. ------------------------------------------------------------------------------------------------------------------  RADIOLOGY:  No results found.  EKG:   Orders placed or performed in visit on 01/01/14  . EKG 12-Lead    ASSESSMENT AND PLAN:    1. Left foot diabetic infection: Status post surgery postop day #2 Continue on IV vancomycin, Zosyn. 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 Alvera Singh recommendations F/u with ID re : [email or web address]  #2 diabetes mellitus type 2:  Continue metformin, glyburide,  insulin  sliding scale coverage  #3.  chronic Pain syndrome: History of spinal stenosis: Follows up with pain management, takes oxycodone 5 mg every 6 hours.  #4 essential hypertension: Continue lisinopril.  History of neuropathy: Continue Neurontin.    All the records are reviewed and case discussed with Care Management/Social Workerr. Management plans discussed with the patient, family and they are in agreement.  CODE STATUS: fc  TOTAL TIME TAKING CARE OF THIS PATIENT: 36 minutes.   POSSIBLE D/C IN 2 DAYS,  DEPENDING ON CLINICAL CONDITION.  Note: This dictation was prepared with Dragon dictation along with smaller phrase technology. Any transcriptional errors that result from this process are unintentional.   Nicholes Mango M.D on 10/15/2016 at 10:07 PM  Between 7am to 6pm - Pager - [phone] After 6pm go to [email or web address] - password [email or web address] [facility]  Office  [phone]  CC: Primary care physician; [place]

## 2016-10-15 NOTE — Care Management Note (Signed)
Case Management Note  Patient Details  Name: Willie Krueger MRN: [id number] Date of Birth: [birth year]  Subjective/Objective:                  Met with patient to discuss discharge planning. Patient has [place] which is very difficult to arrange home health services for if no IV Antibiotics are needed. Patient has a rolling walker however he state he is independent from home with his wife. His PCP is with [facility]. He uses [facility] for medications. He has Medicare but if it is not primary then [place] stands.  Action/Plan: RNCM will continue to follow. ID pending.   Expected Discharge Date:                  Expected Discharge Plan:     In-House Referral:     Discharge planning Services  CM Consult  Post Acute Care Choice:  Home Health Choice offered to:  Patient  DME Arranged:    DME Agency:     HH Arranged:    [place] Agency:     Status of Service:  In process, will continue to follow  If discussed at Long Length of Stay Meetings, dates discussed:    Additional Comments:  Marshell Garfinkel, RN 10/15/2016, 10:02 AM

## 2016-10-16 ENCOUNTER — Inpatient Hospital Stay: Payer: 59

## 2016-10-16 LAB — GLUCOSE, CAPILLARY
GLUCOSE-CAPILLARY: 147 mg/dL — AB (ref 65–99)
Glucose-Capillary: 183 mg/dL — ABNORMAL HIGH (ref 65–99)
Glucose-Capillary: 99 mg/dL (ref 65–99)

## 2016-10-16 LAB — SURGICAL PATHOLOGY

## 2016-10-16 MED ORDER — ATORVASTATIN CALCIUM 40 MG PO TABS: 40.0000 mg | ORAL_TABLET | Freq: Every day | ORAL | 0 refills | Status: DC

## 2016-10-16 MED ORDER — DOCUSATE SODIUM 100 MG PO CAPS
100.0000 mg | ORAL_CAPSULE | Freq: Two times a day (BID) | ORAL | 0 refills | Status: DC
Start: 2016-10-16 — End: 2018-05-28

## 2016-10-16 MED ORDER — NICOTINE 21 MG/24HR TD PT24: 21.0000 mg | MEDICATED_PATCH | Freq: Every day | TRANSDERMAL | 0 refills | Status: DC

## 2016-10-16 MED ORDER — OXYCODONE HCL 15 MG PO TABS: 15.0000 mg | ORAL_TABLET | Freq: Every day | ORAL | 0 refills | Status: DC | PRN

## 2016-10-16 MED ORDER — PIPERACILLIN-TAZOBACTAM 3.375 G IVPB: 3.3750 g | Freq: Three times a day (TID) | INTRAVENOUS | 84 refills | Status: DC

## 2016-10-16 MED ORDER — BISACODYL 5 MG PO TBEC: 5.0000 mg | DELAYED_RELEASE_TABLET | Freq: Every day | ORAL | 0 refills | Status: DC | PRN

## 2016-10-16 MED ORDER — PIPERACILLIN-TAZOBACTAM 3.375 G IVPB: 3.3750 g | Freq: Three times a day (TID) | INTRAVENOUS | 84 refills | Status: AC

## 2016-10-16 NOTE — Care Management (Signed)
Antibiotics are in place for home health to start tomorrow. Per Dr. Ola Spurr patient will receive dose today after PICC is placed and may resume in the AM. Corene Cornea with [facility] aware. No further RNCM needs.

## 2016-10-16 NOTE — Progress Notes (Signed)
Pharmacy Antibiotic Note  Willie Krueger is a 60 y.o. male admitted on 10/13/2016 with diabetic foot wound/infection.  Pharmacy has been consulted for vancomycin and piperacillin/tazobactam dosing.  Plan: Pt is currently receiving day 3 of full piperacillin/tazobactam 3.375 g IV q 8h IE therapy. Per ID note, pt is to be discharged with PICC on piperacillin/tazobactam 3.375 g IV q8h until November 13. Vanc has been discontinued.   Height: 6\' 2"  (188 cm) Weight: 220 lb 12.8 oz (100.2 kg) IBW/kg (Calculated) : 82.2  Temp (24hrs), Avg:98.1 F (36.7 C), Min:98 F (36.7 C), Max:98.2 F (36.8 C)   Recent Labs Lab 10/13/16 1848 10/14/16 0348 10/15/16 0446 10/15/16 1920  WBC  --  13.6* 10.0  --   CREATININE 1.06 1.13 0.89  --   VANCOTROUGH  --   --   --  18    Estimated Creatinine Clearance: 110.2 mL/min (by C-G formula based on SCr of 0.89 mg/dL).    Allergies  Allergen Reactions  . Cleocin [Clindamycin] Rash  . Sulfa Antibiotics Rash    Antimicrobials this admission: vancomycin >> 10/19 Piperacillin/tazobactam 10/16 >>  Cefazolin x 1  Dose adjustments this admission:  Microbiology results: 10/16 Wound Cx: Pending 10/16 MRSA PCR: Negative  Thank you for allowing pharmacy to be a part of this patient's care.  Darrow Bussing, PharmD Pharmacy Resident 10/16/2016 2:12 PM

## 2016-10-16 NOTE — Care Management (Signed)
[facility] for home IV antibiotics and dressing changes. Patient denies DME needs. Prices pending for antibiotics. RNCM will continue follow.

## 2016-10-16 NOTE — Progress Notes (Signed)
Left foot pain minimal; c/o chronic back/neck pain; argumentative overnight regarding home pain meds and frequency; Barbaraann Faster, RN 6:53 AM 10/16/2016

## 2016-10-16 NOTE — Consult Note (Signed)
[facility] Infectious Disease     Reason for Consult:Diabetic foot infection     Referring Physician: Nicholes Mango Date of Admission:  10/13/2016   Active Problems:   Diabetic infection of left foot ([place])   HPI: Willie Krueger is a 60 y.o. male admitted with worsening of a L foot chronic infection. On admit wbc 13 and Xray showing First dital phalanx osteomyelitis and gas in soft tissues. . Had I and D and amp of distal IP joint of L great toe. Culture pending.  Had recently been on augmentin as otpt.   Past Medical History:  Diagnosis Date  . Chronic pain   . Chronic pain associated with significant psychosocial dysfunction 05/04/2015   Last Assessment & Plan:  He is having worsening problems with breakthrough pain. Gabapentin is increased from 800 mg 3 times daily to 1200 mg 3 times daily. Oxycodone is also renewed. He continues to through evaluation at [facility] pain clinic.   . Diabetes mellitus without complication ([place])   . Heart murmur   . History of spinal stenosis 10/31/2015   C3-4 with severe central canal stenosis with small amount of myelomalacia present. Moderate to severe canal stenosis is also present at C4-5 and C5-6. Moderate to severe neuroforaminal narrowing between the levels of C2-C7  . Hyperlipidemia   . Hypertension   . Leg weakness 07/07/2014  . Mitral valve regurgitation   . Nerve root inflammation 06/26/2014   Last Assessment & Plan:  Status is unchanged with ongoing pain and limited mobility. Continue with physical therapy and pain management.   . Osteoarthritis    Past Surgical History:  Procedure Laterality Date  . CARPAL TUNNEL RELEASE    . IRRIGATION AND DEBRIDEMENT FOOT Left 10/13/2016   Procedure: IRRIGATION AND DEBRIDEMENT FOOT;  Surgeon: Samara Deist, DPM;  Location: [facility] ORS;  Service: Podiatry;  Laterality: Left;  . SPINE SURGERY     Social History  Substance Use Topics  . Smoking status: Current Every Day Smoker    Packs/day: 1.50   Types: Cigarettes  . Smokeless tobacco: Never Used  . Alcohol use No   Family History  Problem Relation Age of Onset  . Cancer Mother   . Diabetes Mother   . Heart disease Father     Allergies:  Allergies  Allergen Reactions  . Cleocin [Clindamycin] Rash  . Sulfa Antibiotics Rash    Current antibiotics: Antibiotics Given (last 72 hours)    Date/Time Action Medication Dose Rate   10/13/16 2004 Given   piperacillin-tazobactam (ZOSYN) IVPB 3.375 g 3.375 g 12.5 mL/hr   10/13/16 2150 Given   ceFAZolin (ANCEF) IVPB 2g/100 mL premix 2 g    10/14/16 0111 Given   vancomycin (VANCOCIN) IVPB 1000 mg/200 mL premix 1,000 mg 200 mL/hr   10/14/16 0524 Given   piperacillin-tazobactam (ZOSYN) IVPB 3.375 g 3.375 g 12.5 mL/hr   10/14/16 0904 Given   vancomycin (VANCOCIN) 1,250 mg in sodium chloride 0.9 % 250 mL IVPB 1,250 mg 166.7 mL/hr   10/14/16 1345 Given   piperacillin-tazobactam (ZOSYN) IVPB 3.375 g 3.375 g 12.5 mL/hr   10/14/16 2106 Given   vancomycin (VANCOCIN) 1,250 mg in sodium chloride 0.9 % 250 mL IVPB 1,250 mg 166.7 mL/hr   10/14/16 2107 Given   piperacillin-tazobactam (ZOSYN) IVPB 3.375 g 3.375 g 12.5 mL/hr   10/15/16 0537 Given   piperacillin-tazobactam (ZOSYN) IVPB 3.375 g 3.375 g 12.5 mL/hr   10/15/16 0803 Given   vancomycin (VANCOCIN) 1,250 mg in sodium chloride  0.9 % 250 mL IVPB 1,250 mg 166.7 mL/hr   10/15/16 1350 Given   piperacillin-tazobactam (ZOSYN) IVPB 3.375 g 3.375 g 12.5 mL/hr   10/15/16 2059 Given   vancomycin (VANCOCIN) 1,250 mg in sodium chloride 0.9 % 250 mL IVPB 1,250 mg 166.7 mL/hr   10/15/16 2337 Given  [vancomycin running]   piperacillin-tazobactam (ZOSYN) IVPB 3.375 g 3.375 g 12.5 mL/hr   10/16/16 0559 Given   piperacillin-tazobactam (ZOSYN) IVPB 3.375 g 3.375 g 12.5 mL/hr   10/16/16 0814 Given   vancomycin (VANCOCIN) 1,250 mg in sodium chloride 0.9 % 250 mL IVPB 1,250 mg 166.7 mL/hr      MEDICATIONS: . acetaminophen  500 mg Oral Q4H  .  aspirin EC  81 mg Oral Daily  . atorvastatin  40 mg Oral q1800  . cholecalciferol  2,000 Units Oral Daily  . docusate sodium  100 mg Oral BID  . enoxaparin (LOVENOX) injection  40 mg Subcutaneous Q24H  . furosemide  40 mg Oral BID  . gabapentin  1,200 mg Oral TID  . glimepiride  2 mg Oral Q breakfast  . insulin aspart  0-9 Units Subcutaneous TID WC  . lisinopril  40 mg Oral Daily  . metFORMIN  500 mg Oral BID  . nicotine  21 mg Transdermal Daily  . oxyCODONE  15 mg Oral 5 X Daily  . piperacillin-tazobactam (ZOSYN)  IV  3.375 g Intravenous Once  . piperacillin-tazobactam (ZOSYN)  IV  3.375 g Intravenous Q8H  . vancomycin  1,250 mg Intravenous Q12H    Review of Systems - 11 systems reviewed and negative per HPI   OBJECTIVE: Temp:  [98 F (36.7 C)-98.2 F (36.8 C)] 98.2 F (36.8 C) (10/19 0742) Pulse Rate:  [70-77] 77 (10/19 1045) Resp:  [16-18] 18 (10/19 0742) BP: (134-148)/(78-88) 148/88 (10/19 1045) SpO2:  [97 %-99 %] 97 % (10/19 0742) Weight:  [100.2 kg (220 lb 12.8 oz)] 100.2 kg (220 lb 12.8 oz) (10/19 7903) Physical Exam  Constitutional: He is oriented to person, place, and time. He appears well-developed and well-nourished. No distress.  HENT: anicteric Mouth/Throat: Oropharynx is clear and moist. No oropharyngeal exudate.  Cardiovascular: Normal rate, regular rhythm and normal heart sounds.  Pulmonary/Chest: Effort normal and breath sounds normal. No respiratory distress. He has no wheezes.  Abdominal: Soft. Bowel sounds are normal. He exhibits no distension. There is no tenderness.  Lymphadenopathy: He has no cervical adenopathy.  Neurological: He is alert and oriented to person, place, and time.  Skin: L great toe incision site cdi, sutures in place , incision on dorsum foot intact as well Mild erythema Psychiatric: He has a normal mood and affect. His behavior is normal.   LABS: Results for orders placed or performed during the hospital encounter of 10/13/16 (from  the past 48 hour(s))  Glucose, capillary     Status: Abnormal   Collection Time: 10/14/16 12:01 PM  Result Value Ref Range   Glucose-Capillary 262 (H) 65 - 99 mg/dL   Comment 1 Notify RN   Glucose, capillary     Status: Abnormal   Collection Time: 10/14/16  4:42 PM  Result Value Ref Range   Glucose-Capillary 207 (H) 65 - 99 mg/dL  Glucose, capillary     Status: Abnormal   Collection Time: 10/14/16  9:03 PM  Result Value Ref Range   Glucose-Capillary 114 (H) 65 - 99 mg/dL  CBC with Differential/Platelet     Status: Abnormal   Collection Time: 10/15/16  4:46 AM  Result Value Ref Range   WBC 10.0 3.8 - 10.6 K/uL   RBC 3.84 (L) 4.40 - 5.90 MIL/uL   Hemoglobin 12.7 (L) 13.0 - 18.0 g/dL   HCT 37.5 (L) 40.0 - 52.0 %   MCV 97.7 80.0 - 100.0 fL   MCH 32.9 26.0 - 34.0 pg   MCHC 33.7 32.0 - 36.0 g/dL   RDW 13.9 11.5 - 14.5 %   Platelets 258 150 - 440 K/uL   Neutrophils Relative % 71 %   Neutro Abs 7.0 (H) 1.4 - 6.5 K/uL   Lymphocytes Relative 17 %   Lymphs Abs 1.7 1.0 - 3.6 K/uL   Monocytes Relative 10 %   Monocytes Absolute 1.0 0.2 - 1.0 K/uL   Eosinophils Relative 2 %   Eosinophils Absolute 0.2 0 - 0.7 K/uL   Basophils Relative 0 %   Basophils Absolute 0.0 0 - 0.1 K/uL  Basic metabolic panel     Status: Abnormal   Collection Time: 10/15/16  4:46 AM  Result Value Ref Range   Sodium 138 135 - 145 mmol/L   Potassium 4.4 3.5 - 5.1 mmol/L   Chloride 107 101 - 111 mmol/L   CO2 25 22 - 32 mmol/L   Glucose, Bld 157 (H) 65 - 99 mg/dL   BUN 14 6 - 20 mg/dL   Creatinine, Ser 0.89 0.61 - 1.24 mg/dL   Calcium 8.7 (L) 8.9 - 10.3 mg/dL   GFR calc non Af Amer >60 >60 mL/min   GFR calc Af Amer >60 >60 mL/min    Comment: (NOTE) The eGFR has been calculated using the CKD EPI equation. This calculation has not been validated in all clinical situations. eGFR's persistently <60 mL/min signify possible Chronic Kidney Disease.    Anion gap 6 5 - 15  Glucose, capillary     Status: Abnormal    Collection Time: 10/15/16  8:12 AM  Result Value Ref Range   Glucose-Capillary 164 (H) 65 - 99 mg/dL  Glucose, capillary     Status: Abnormal   Collection Time: 10/15/16  3:30 PM  Result Value Ref Range   Glucose-Capillary 212 (H) 65 - 99 mg/dL  Vancomycin, trough     Status: None   Collection Time: 10/15/16  7:20 PM  Result Value Ref Range   Vancomycin Tr 18 15 - 20 ug/mL  Glucose, capillary     Status: Abnormal   Collection Time: 10/15/16  9:25 PM  Result Value Ref Range   Glucose-Capillary 121 (H) 65 - 99 mg/dL  Glucose, capillary     Status: Abnormal   Collection Time: 10/16/16  7:41 AM  Result Value Ref Range   Glucose-Capillary 147 (H) 65 - 99 mg/dL  Glucose, capillary     Status: Abnormal   Collection Time: 10/16/16 11:25 AM  Result Value Ref Range   Glucose-Capillary 183 (H) 65 - 99 mg/dL   No components found for: ESR, C REACTIVE PROTEIN MICRO: Recent Results (from the past 720 hour(s))  Surgical pcr screen     Status: None   Collection Time: 10/13/16  7:46 PM  Result Value Ref Range Status   MRSA, PCR NEGATIVE NEGATIVE Final   Staphylococcus aureus NEGATIVE NEGATIVE Final    Comment:        The Xpert SA Assay (FDA approved for NASAL specimens in patients over 55 years of age), is one component of a comprehensive surveillance program.  Test performance has been validated by [facility] for patients greater than or  equal to 21 year old. It is not intended to diagnose infection nor to guide or monitor treatment.   Aerobic/Anaerobic Culture (surgical/deep wound)     Status: None (Preliminary result)   Collection Time: 10/13/16 10:18 PM  Result Value Ref Range Status   Specimen Description WOUND LEFT TOE  Final   Special Requests NONE  Final   Gram Stain   Final    DEGENERATED CELLULAR MATERIAL PRESENT FEW GRAM POSITIVE COCCI IN PAIRS    Culture   Final    HOLDING FOR POSSIBLE ANAEROBE Performed at [facility]    Report Status PENDING  Incomplete     IMAGING: Dg Foot Complete Left  Result Date: 10/13/2016 CLINICAL DATA:  Foot infection.  Pain EXAM: LEFT FOOT - COMPLETE 3+ VIEW COMPARISON:  None. FINDINGS: Soft tissue swelling of the great toe. Bony erosion of the tuft of the distal first phalanx compatible with osteomyelitis. There is gas in the soft tissues of the great toe dorsally. Pes planus. Negative for fracture. Mild degenerative change in the midfoot. IMPRESSION: Osteomyelitis of the first distal phalanx. Electronically Signed   By: Franchot Gallo M.D.   On: 10/13/2016 19:15    Assessment:   Willie Krueger is a 60 y.o. male with diabetic foot infection and osteo of L great toe, with gas noted on xray, failing outpt oral abx, now s/p amputation of distal L great toe and I and D. Cultures pending. ESR 82, crp 28.9, cr 0.890, wbc 10, hgb 12.7 MRSA PCR negative Recommendations Place PICC Will need 4-6 weeks of IV abx - at this point with MRSA PCR neg can dc on IV zosyn and no vancomycin  Will follow cultures  FU 2-3 weeks.  Thank you very much for allowing me to participate in the care of this patient. Please call with questions.   Cheral Marker. Ola Spurr, MD

## 2016-10-16 NOTE — Progress Notes (Signed)
DISCHARGE NOTE:  Pt given discharge instructions and prescriptions. Pt verbalized understanding. Pt given home meds (list in pts chart), second nurse verifier at bedside Reita May, RN). PICC remain in place. Pt wheeled to car by staff.

## 2016-10-16 NOTE — Discharge Summary (Signed)
[place] [facility] [place] NAME: Willie Krueger    MR#:  [id number]  DATE OF BIRTH:  12-15-1956  DATE OF ADMISSION:  10/13/2016 ADMITTING PHYSICIAN: Epifanio Lesches, MD  DATE OF DISCHARGE: 10/16/16  PRIMARY CARE PHYSICIAN: [facility]    ADMISSION DIAGNOSIS:  Cellulitis Lt foot diabetic  N/A  DISCHARGE DIAGNOSIS:  Left foot osteomyelitis status post surgery  SECONDARY DIAGNOSIS:   Past Medical History:  Diagnosis Date  . Chronic pain   . Chronic pain associated with significant psychosocial dysfunction 05/04/2015   Last Assessment & Plan:  He is having worsening problems with breakthrough pain. Gabapentin is increased from 800 mg 3 times daily to 1200 mg 3 times daily. Oxycodone is also renewed. He continues to through evaluation at [facility] pain clinic.   . Diabetes mellitus without complication ([place])   . Heart murmur   . History of spinal stenosis 10/31/2015   C3-4 with severe central canal stenosis with small amount of myelomalacia present. Moderate to severe canal stenosis is also present at C4-5 and C5-6. Moderate to severe neuroforaminal narrowing between the levels of C2-C7  . Hyperlipidemia   . Hypertension   . Leg weakness 07/07/2014  . Mitral valve regurgitation   . Nerve root inflammation 06/26/2014   Last Assessment & Plan:  Status is unchanged with ongoing pain and limited mobility. Continue with physical therapy and pain management.   . Osteoarthritis     HOSPITAL COURSE:    Please review history and physical and progress note for details   # Left foot diabetic infection: Osteoarthritis  Status post surgery postop day #3 Patient will be discharged home after PICC line placement today and will be continued on IV Zosyn  Wound cultures are pending, will be followed by infectious disease after discharge Status post amputation of the distal interphalangeal joint left great toe  Appreciate Dr. Alvera Singh  recommendations-Dressing changes 3 x's per week with dry dressing for now.  Home health can use calcium alginate for drainage recommending follow-up in 2 weeks ID is recommending Zosyn 3.375 g IV every 8 hours at least for 4 weeks and follow-up appointment with him in 3 weeks cultures will be followed by ID Weekly labs CBC with differential CMP ESR and CRP to be faxed over to Dr. Ola Spurr [phone]  #2 diabetes mellitus type 2:  Continue metformin, glyburide,  insulin  sliding scale coverage  #3. chronic Pain syndrome: History of spinal stenosis: Follows up with pain management, takes oxycodone 15 mg 5 times a day as needed which is changed to scheduled prescription for given for 50 tablets,.  #4 essential hypertension: Continue lisinopril.  History of neuropathy: Continue Neurontin.    DISCHARGE CONDITIONS:   fair  CONSULTS OBTAINED:  Treatment Team:  Leonel Ramsay, MD   PROCEDURES tatus post amputation of the distal interphalangeal joint left great toe   DRUG ALLERGIES:   Allergies  Allergen Reactions  . Cleocin [Clindamycin] Rash  . Sulfa Antibiotics Rash    DISCHARGE MEDICATIONS:   Current Discharge Medication List    START taking these medications   Details  atorvastatin (LIPITOR) 40 MG tablet Take 1 tablet (40 mg total) by mouth daily at 6 PM. Qty: 30 tablet, Refills: 0    bisacodyl (DULCOLAX) 5 MG EC tablet Take 1 tablet (5 mg total) by mouth daily as needed for moderate constipation. Qty: 30 tablet, Refills: 0    docusate sodium (COLACE) 100 MG capsule Take 1  capsule (100 mg total) by mouth 2 (two) times daily. Qty: 10 capsule, Refills: 0    nicotine (NICODERM CQ - DOSED IN MG/24 HOURS) 21 mg/24hr patch Place 1 patch (21 mg total) onto the skin daily. Qty: 28 patch, Refills: 0    piperacillin-tazobactam (ZOSYN) 3.375 GM/50ML IVPB Inject 50 mLs (3.375 g total) into the vein every 8 (eight) hours. Qty: 50 mL, Refills: 84       CONTINUE these medications which have CHANGED   Details  oxyCODONE (ROXICODONE) 15 MG immediate release tablet Take 1 tablet (15 mg total) by mouth 5 (five) times daily as needed for pain. Qty: 50 tablet, Refills: 0   Associated Diagnoses: Chronic pain      CONTINUE these medications which have NOT CHANGED   Details  acetaminophen (TYLENOL) 500 MG tablet Take 500 mg by mouth every 4 (four) hours. Scheduled (5 times daily)    aspirin EC 81 MG tablet Take 81 mg by mouth daily.    Cholecalciferol (VITAMIN D3) 2000 units capsule Take 2,000 Units by mouth daily.     diphenhydramine-acetaminophen (TYLENOL PM) 25-500 MG TABS tablet Take 2 tablets by mouth at bedtime. Reported on 03/17/2016    furosemide (LASIX) 20 MG tablet Take 40 mg by mouth 2 (two) times daily.     gabapentin (NEURONTIN) 600 MG tablet Take 2 tablets (1,200 mg total) by mouth every 6 (six) hours as needed. Qty: 240 tablet, Refills: 2   Associated Diagnoses: Neurogenic pain    glimepiride (AMARYL) 2 MG tablet Take 2 mg by mouth daily with breakfast.     lisinopril (PRINIVIL,ZESTRIL) 40 MG tablet Take 40 mg by mouth daily.    metFORMIN (GLUCOPHAGE-XR) 500 MG 24 hr tablet Take 500 mg by mouth 2 (two) times daily.    sildenafil (VIAGRA) 100 MG tablet Take 100 mg by mouth daily as needed for erectile dysfunction.      STOP taking these medications     pravastatin (PRAVACHOL) 20 MG tablet          DISCHARGE INSTRUCTIONS:   Continue wound care as recommended by podiatry Follow-up with infectious disease in 3 weeks. Home health to get repeat labs weekly CBC with differential, CMP, ESR and CRP and fax them to Dr. Blane Ohara office Follow-up with podiatry in 2 weeks Follow-up with primary care physician in a week  DIET:  Diabetic diet,cardiac  DISCHARGE CONDITION:  Fair  ACTIVITY:  Activity as tolerated per podiatry recs  OXYGEN:  Home Oxygen: No.   Oxygen Delivery: room air  DISCHARGE LOCATION:   home   If you experience worsening of your admission symptoms, develop shortness of breath, life threatening emergency, suicidal or homicidal thoughts you must seek medical attention immediately by calling 911 or calling your MD immediately  if symptoms less severe.  You Must read complete instructions/literature along with all the possible adverse reactions/side effects for all the Medicines you take and that have been prescribed to you. Take any new Medicines after you have completely understood and accpet all the possible adverse reactions/side effects.   Please note  You were cared for by a hospitalist during your hospital stay. If you have any questions about your discharge medications or the care you received while you were in the hospital after you are discharged, you can call the unit and asked to speak with the hospitalist on call if the hospitalist that took care of you is not available. Once you are discharged, your primary care physician will handle  any further medical issues. Please note that NO REFILLS for any discharge medications will be authorized once you are discharged, as it is imperative that you return to your primary care physician (or establish a relationship with a primary care physician if you do not have one) for your aftercare needs so that they can reassess your need for medications and monitor your lab values.     Today  No chief complaint on file.  Patient is doing fine. Wants to go home as hospital beds are uncomfortable  ROS:  CONSTITUTIONAL: Denies fevers, chills. Denies any fatigue, weakness.  EYES: Denies blurry vision, double vision, eye pain. EARS, NOSE, THROAT: Denies tinnitus, ear pain, hearing loss. RESPIRATORY: Denies cough, wheeze, shortness of breath.  CARDIOVASCULAR: Denies chest pain, palpitations, edema.  GASTROINTESTINAL: Denies nausea, vomiting, diarrhea, abdominal pain. Denies bright red blood per rectum. GENITOURINARY: Denies dysuria,  hematuria. ENDOCRINE: Denies nocturia or thyroid problems. HEMATOLOGIC AND LYMPHATIC: Denies easy bruising or bleeding. SKIN: Denies rash or lesion. MUSCULOSKELETAL: Left foot surgery Has chronic low back pain and neck pain  NEUROLOGIC: Denies paralysis, paresthesias.  PSYCHIATRIC: Denies anxiety or depressive symptoms.   VITAL SIGNS:  Blood pressure (!) 148/88, pulse 77, temperature 98.2 F (36.8 C), temperature source Oral, resp. rate 18, height 6' 2"  (1.88 m), weight 100.2 kg (220 lb 12.8 oz), SpO2 97 %.  I/O:    Intake/Output Summary (Last 24 hours) at 10/16/16 1401 Last data filed at 10/16/16 1300  Gross per 24 hour  Intake           1360.5 ml  Output              825 ml  Net            535.5 ml    PHYSICAL EXAMINATION:  GENERAL:  60 y.o.-year-old patient lying in the bed with no acute distress.  EYES: Pupils equal, round, reactive to light and accommodation. No scleral icterus. Extraocular muscles intact.  HEENT: Head atraumatic, normocephalic. Oropharynx and nasopharynx clear.  NECK:  Supple, no jugular venous distention. No thyroid enlargement, no tenderness.  LUNGS: Normal breath sounds bilaterally, no wheezing, rales,rhonchi or crepitation. No use of accessory muscles of respiration.  CARDIOVASCULAR: S1, S2 normal. No murmurs, rubs, or gallops.  ABDOMEN: Soft, non-tender, non-distended. Bowel sounds present. No organomegaly or mass.  EXTREMITIES: Left foot status post surgery in clean dressing No pedal edema, cyanosis, or clubbing.  NEUROLOGIC: Cranial nerves II through XII are intact. Muscle strength 5/5 in all extremities. Sensation intact. Gait not checked.  PSYCHIATRIC: The patient is alert and oriented x 3.  SKIN: No obvious rash, lesion, or ulcer.   DATA REVIEW:   CBC  Recent Labs Lab 10/15/16 0446  WBC 10.0  HGB 12.7*  HCT 37.5*  PLT 258    Chemistries   Recent Labs Lab 10/15/16 0446  NA 138  K 4.4  CL 107  CO2 25  GLUCOSE 157*  BUN 14   CREATININE 0.89  CALCIUM 8.7*    Cardiac Enzymes No results for input(s): TROPONINI in the last 168 hours.  Microbiology Results  Results for orders placed or performed during the hospital encounter of 10/13/16  Surgical pcr screen     Status: None   Collection Time: 10/13/16  7:46 PM  Result Value Ref Range Status   MRSA, PCR NEGATIVE NEGATIVE Final   Staphylococcus aureus NEGATIVE NEGATIVE Final    Comment:        The Xpert SA Assay (FDA approved  for NASAL specimens in patients over 71 years of age), is one component of a comprehensive surveillance program.  Test performance has been validated by [facility] for patients greater than or equal to 34 year old. It is not intended to diagnose infection nor to guide or monitor treatment.   Aerobic/Anaerobic Culture (surgical/deep wound)     Status: None (Preliminary result)   Collection Time: 10/13/16 10:18 PM  Result Value Ref Range Status   Specimen Description WOUND LEFT TOE  Final   Special Requests NONE  Final   Gram Stain   Final    DEGENERATED CELLULAR MATERIAL PRESENT FEW GRAM POSITIVE COCCI IN PAIRS    Culture   Final    HOLDING FOR POSSIBLE ANAEROBE Performed at [facility]    Report Status PENDING  Incomplete    RADIOLOGY:  Dg Foot Complete Left  Result Date: 10/13/2016 CLINICAL DATA:  Foot infection.  Pain EXAM: LEFT FOOT - COMPLETE 3+ VIEW COMPARISON:  None. FINDINGS: Soft tissue swelling of the great toe. Bony erosion of the tuft of the distal first phalanx compatible with osteomyelitis. There is gas in the soft tissues of the great toe dorsally. Pes planus. Negative for fracture. Mild degenerative change in the midfoot. IMPRESSION: Osteomyelitis of the first distal phalanx. Electronically Signed   By: Franchot Gallo M.D.   On: 10/13/2016 19:15    EKG:   Orders placed or performed in visit on 01/01/14  . EKG 12-Lead      Management plans discussed with the patient, he is in  agreement.  CODE STATUS:     Code Status Orders        Start     Ordered   10/13/16 2344  Full code  Continuous     10/13/16 2343    Code Status History    Date Active Date Inactive Code Status Order ID Comments User Context   10/13/2016 12:30 PM 10/13/2016  2:59 PM Full Code [id number]  Epifanio Lesches, MD Inpatient      TOTAL TIME TAKING CARE OF THIS PATIENT: 45 minutes.   Note: This dictation was prepared with Dragon dictation along with smaller phrase technology. Any transcriptional errors that result from this process are unintentional.   @[facility] @  on 10/16/2016 at 2:01 PM  Between 7am to 6pm - Pager - [phone]  After 6pm go to [email or web address] - password [email or web address] [facility]  Office  [phone]  CC: Primary care physician; [place]

## 2016-10-16 NOTE — Evaluation (Signed)
Physical Therapy Evaluation Patient Details Name: Willie Krueger MRN: [id number] DOB: 22-Aug-1956 Today's Date: 10/16/2016   History of Present Illness  Pt. is a 60 y.o. 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   Clinical Impression  Pt. Sitting EOB upon arrival, eager to participate in activity. Pt. Demonstrates grossly WFL strength, RLE (4-/5) <LLE (5/5) secondary to hx. Back pain and surgery. Pt. Able to perform sit<>stand transfers with safe technique throughout session supervision A with and without RW pt. Able to ambulate approx. 150ft. Without AD min A demonstrates lateral sway, decreased cadence, and slightly elevated UE s to maintain balance, pt. Also able to ambulate approx. 166ft. With use of RW CGA/supervision demonstrating increased cadence, decreased sway, no LOB. Pt. Able to don shoe brought from home on LLE independently at EOB, shoe remained donned for all mobility. Pt. Reports he feels he is at baseline level of mobility and denies concerns related to mobility. Pt. Agreeable to recommendation of RW use for all mobility at this time. Pt. Verbalized and demonstrates understanding of PWB with all mobility during session. No further need for skilled PT at this time.     Follow Up Recommendations No PT follow up    Equipment Recommendations       Recommendations for Other Services       Precautions / Restrictions Precautions Precautions: Fall Required Braces or Orthoses: Other Brace/Splint Other Brace/Splint: LLE shoe  Restrictions Weight Bearing Restrictions: Yes LLE Weight Bearing: Partial weight bearing LLE Partial Weight Bearing Percentage or Pounds: heel only      Mobility  Bed Mobility Overal bed mobility: Modified Independent                Transfers Overall transfer level: Needs assistance Equipment used: Rolling walker (2 wheeled) Transfers: Sit to/from Stand Sit to Stand: Supervision          General transfer comment: Pt. able to perform multiple sit<>stands with and without AD, safe technique without cueing  Ambulation/Gait Ambulation/Gait assistance: Min assist Ambulation Distance (Feet): 120 Feet         General Gait Details: pt. demonstrates increased sway, decreased cadence without AD, UE's slightly elevated for improved balanceable to perform multidirectional head turns without LOB.  Stairs            Wheelchair Mobility    Modified Rankin (Stroke Patients Only)       Balance Overall balance assessment: Needs assistance Sitting-balance support: Feet supported Sitting balance-Leahy Scale: Normal Sitting balance - Comments: pt. demonstrates good dynamic sitting balance, able to don shoe independently    Standing balance support: No upper extremity supported Standing balance-Leahy Scale: Good                               Pertinent Vitals/Pain Pain Assessment: No/denies pain    Home Living Family/patient expects to be discharged to:: Private residence Living Arrangements: Spouse/significant other;Other relatives (spouse and aunt) Available Help at Discharge: Family Type of Home: House Home Access: Ramped entrance     Home Layout: One level Home Equipment: Environmental consultant - 2 wheels;Walker - 4 wheels;Cane - single point;Wheelchair - manual      Prior Function Level of Independence: Independent         Comments: pt. reports he has used AD's occasionally in past, but is independent with all mobility at baseline. 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 demonstrates grossly 5/5 strength RLE, LLE grossly 4-/5 secondary to hx. back pain/sx.)         Communication   Communication: No difficulties  Cognition Arousal/Alertness: Awake/alert Behavior  During Therapy: WFL for tasks assessed/performed Overall Cognitive Status: Within Functional Limits for tasks assessed                      General Comments      Exercises Other Exercises Other Exercises: Pt. performed approx. 170ft. ambulation with use of RW CGA/supervision pt. demonstrates improved cadence and decreased sway able to maintain PWB throughout ambulation distance. pt. demonstrates ability to perform static standing without use of UE support for use of urinal during session, supervision A.   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

## 2016-10-16 NOTE — Discharge Instructions (Signed)
Continue wound care as recommended by podiatry Follow-up with infectious disease in 3 weeks. Home health to get repeat labs weekly CBC with differential, CMP, ESR and CRP and fax them to Dr. Blane Ohara office Follow-up with podiatry in 2 weeks Follow-up with primary care physician in a week

## 2016-10-16 NOTE — Progress Notes (Signed)
Daily Progress Note   Subjective  - 3 Days Post-Op  Pt anxious for d/c. Pain mild to foot.  C/o back pain.  Objective Vitals:   10/15/16 2036 10/16/16 0439 10/16/16 0452 10/16/16 0742  BP: 134/78 135/81  136/82  Pulse: 70 71  73  Resp: 16 18  18   Temp: 98.1 F (36.7 C) 98 F (36.7 C)  98.2 F (36.8 C)  TempSrc: Oral Oral  Oral  SpO2: 98% 99%  97%  Weight:   100.2 kg (220 lb 12.8 oz)   Height:        Physical Exam: Wound with mild drainage from proximal portion of incision.  Small superficial necrotic area on stump site. Erythema is markedly decreased.  Laboratory CBC    Component Value Date/Time   WBC 10.0 10/15/2016 0446   HGB 12.7 (L) 10/15/2016 0446   HGB 13.6 01/01/2014 2238   HCT 37.5 (L) 10/15/2016 0446   HCT 40.2 01/01/2014 2238   PLT 258 10/15/2016 0446   PLT 170 01/01/2014 2238    BMET    Component Value Date/Time   NA 138 10/15/2016 0446   NA 138 01/01/2014 2238   K 4.4 10/15/2016 0446   K 4.4 01/01/2014 2238   CL 107 10/15/2016 0446   CL 109 (H) 01/01/2014 2238   CO2 25 10/15/2016 0446   CO2 24 01/01/2014 2238   GLUCOSE 157 (H) 10/15/2016 0446   GLUCOSE 140 (H) 01/01/2014 2238   BUN 14 10/15/2016 0446   BUN 15 01/01/2014 2238   CREATININE 0.89 10/15/2016 0446   CREATININE 1.02 02/20/2016 1217   CALCIUM 8.7 (L) 10/15/2016 0446   CALCIUM 8.6 01/01/2014 2238   GFRNONAA >60 10/15/2016 0446   GFRNONAA >60 01/01/2014 2238   GFRAA >60 10/15/2016 0446   GFRAA >60 01/01/2014 2238    Wound culture with GPC's.  No organism id'd. Assessment/Planning: Osteomyelitis with gas producing infection   S/p partial great toe ampuation.  Suspect will need >2weeks IV abx.  Awaiting ID evaluation.  Dressing changes 3 x's per week with dry dressing for now.  Home health can use calcium alginate for drainage.  F/U with me in 10 - 14 days.      Samara Deist A  10/16/2016, 10:39 AM

## 2016-10-16 NOTE — Progress Notes (Signed)
Infectious Disease Long Term IV Antibiotic Orders [name], Fitterer Male, 60 y.o., July 13, 1956  Diagnosis: Diabetic foot infection and osteomyelitis Willie Krueger is a 60 y.o. male with diabetic foot infection and osteo of L great toe, with gas noted on xray, failing outpt oral abx, now s/p amputation of distal L great toe and I and D. Cultures pending. ESR 82, crp 28.9, cr 0.890, wbc 10, hgb 12.7 MRSA PCR negative Culture results Results for orders placed or performed during the hospital encounter of 10/13/16  Surgical pcr screen     Status: None   Collection Time: 10/13/16  7:46 PM  Result Value Ref Range Status   MRSA, PCR NEGATIVE NEGATIVE Final   Staphylococcus aureus NEGATIVE NEGATIVE Final    Comment:        The Xpert SA Assay (FDA approved for NASAL specimens in patients over 48 years of age), is one component of a comprehensive surveillance program.  Test performance has been validated by [facility] for patients greater than or equal to 72 year old. It is not intended to diagnose infection nor to guide or monitor treatment.   Aerobic/Anaerobic Culture (surgical/deep wound)     Status: None (Preliminary result)   Collection Time: 10/13/16 10:18 PM  Result Value Ref Range Status   Specimen Description WOUND LEFT TOE  Final   Special Requests NONE  Final   Gram Stain   Final    DEGENERATED CELLULAR MATERIAL PRESENT FEW GRAM POSITIVE COCCI IN PAIRS    Culture   Final    HOLDING FOR POSSIBLE ANAEROBE Performed at [facility]    Report Status PENDING  Incomplete    Allergies:  Allergies  Allergen Reactions  . Cleocin [Clindamycin] Rash  . Sulfa Antibiotics Rash    Discharge antibiotics Zosyn 3.375 grams every 8 hours PICC Care per protocol Labs weekly while on IV antibiotics      CBC w diff   Comprehensive met panel ESR  CRP   Planned duration of antibiotics 4 weeks   Stop date Nov 13th Follow up clinic date within 3 weeks   FAX weekly labs to  [phone]  Leonel Ramsay, MD

## 2016-10-17 ENCOUNTER — Telehealth: Payer: Self-pay | Admitting: *Deleted

## 2016-10-20 NOTE — Anesthesia Postprocedure Evaluation (Signed)
Anesthesia Post Note  Patient: Willie Krueger  Procedure(s) Performed: Procedure(s) (LRB): IRRIGATION AND DEBRIDEMENT FOOT (Left)  Patient location during evaluation: PACU Anesthesia Type: General Level of consciousness: awake and alert Pain management: pain level controlled Vital Signs Assessment: post-procedure vital signs reviewed and stable Respiratory status: spontaneous breathing, nonlabored ventilation, respiratory function stable and patient connected to nasal cannula oxygen Cardiovascular status: blood pressure returned to baseline and stable Postop Assessment: no signs of nausea or vomiting Anesthetic complications: no    Last Vitals:  Vitals:   10/16/16 1045 10/16/16 1613  BP: (!) 148/88 (!) 144/86  Pulse: 77 79  Resp:  18  Temp:  36.8 C    Last Pain:  Vitals:   10/16/16 1613  TempSrc: Oral  PainSc:                  Molli Barrows

## 2016-10-21 LAB — AEROBIC/ANAEROBIC CULTURE W GRAM STAIN (SURGICAL/DEEP WOUND)

## 2016-10-29 HISTORY — PX: AMPUTATION TOE: SHX6595

## 2016-11-05 ENCOUNTER — Ambulatory Visit
Admission: RE | Admit: 2016-11-05 | Discharge: 2016-11-05 | Disposition: A | Discharge status: Home / Self Care | Payer: 59 | Source: Ambulatory Visit | Attending: Infectious Diseases | Admitting: Infectious Diseases

## 2016-11-05 ENCOUNTER — Other Ambulatory Visit: Payer: Self-pay | Admitting: Infectious Diseases

## 2016-11-05 DIAGNOSIS — I82622 Acute embolism and thrombosis of deep veins of left upper extremity: Secondary | ICD-10-CM | POA: Insufficient documentation

## 2016-11-05 DIAGNOSIS — M7989 Other specified soft tissue disorders: Secondary | ICD-10-CM | POA: Insufficient documentation

## 2016-11-12 ENCOUNTER — Encounter: Payer: 59 | Admitting: Pain Medicine

## 2016-11-13 ENCOUNTER — Ambulatory Visit: Payer: 59 | Attending: Pain Medicine | Admitting: Pain Medicine

## 2016-11-13 ENCOUNTER — Encounter: Payer: Self-pay | Admitting: Pain Medicine

## 2016-11-13 VITALS — BP 157/86 | HR 87 | Temp 98.0°F | Resp 18 | Ht 74.0 in | Wt 229.0 lb

## 2016-11-13 DIAGNOSIS — F17210 Nicotine dependence, cigarettes, uncomplicated: Secondary | ICD-10-CM | POA: Diagnosis not present

## 2016-11-13 DIAGNOSIS — M79605 Pain in left leg: Secondary | ICD-10-CM | POA: Diagnosis not present

## 2016-11-13 DIAGNOSIS — M25511 Pain in right shoulder: Secondary | ICD-10-CM | POA: Diagnosis not present

## 2016-11-13 DIAGNOSIS — E669 Obesity, unspecified: Secondary | ICD-10-CM | POA: Insufficient documentation

## 2016-11-13 DIAGNOSIS — M79604 Pain in right leg: Secondary | ICD-10-CM | POA: Insufficient documentation

## 2016-11-13 DIAGNOSIS — Z7982 Long term (current) use of aspirin: Secondary | ICD-10-CM | POA: Insufficient documentation

## 2016-11-13 DIAGNOSIS — I10 Essential (primary) hypertension: Secondary | ICD-10-CM | POA: Diagnosis not present

## 2016-11-13 DIAGNOSIS — Z7984 Long term (current) use of oral hypoglycemic drugs: Secondary | ICD-10-CM | POA: Diagnosis not present

## 2016-11-13 DIAGNOSIS — Z6829 Body mass index (BMI) 29.0-29.9, adult: Secondary | ICD-10-CM | POA: Diagnosis not present

## 2016-11-13 DIAGNOSIS — Z79899 Other long term (current) drug therapy: Secondary | ICD-10-CM | POA: Insufficient documentation

## 2016-11-13 DIAGNOSIS — M79672 Pain in left foot: Secondary | ICD-10-CM | POA: Diagnosis present

## 2016-11-13 DIAGNOSIS — M25512 Pain in left shoulder: Secondary | ICD-10-CM | POA: Insufficient documentation

## 2016-11-13 DIAGNOSIS — M545 Low back pain, unspecified: Secondary | ICD-10-CM

## 2016-11-13 DIAGNOSIS — G894 Chronic pain syndrome: Secondary | ICD-10-CM | POA: Diagnosis not present

## 2016-11-13 DIAGNOSIS — E785 Hyperlipidemia, unspecified: Secondary | ICD-10-CM | POA: Diagnosis not present

## 2016-11-13 DIAGNOSIS — E11621 Type 2 diabetes mellitus with foot ulcer: Secondary | ICD-10-CM | POA: Insufficient documentation

## 2016-11-13 DIAGNOSIS — M5412 Radiculopathy, cervical region: Secondary | ICD-10-CM | POA: Diagnosis not present

## 2016-11-13 DIAGNOSIS — E1169 Type 2 diabetes mellitus with other specified complication: Secondary | ICD-10-CM | POA: Insufficient documentation

## 2016-11-13 DIAGNOSIS — Z7901 Long term (current) use of anticoagulants: Secondary | ICD-10-CM | POA: Diagnosis not present

## 2016-11-13 DIAGNOSIS — M792 Neuralgia and neuritis, unspecified: Secondary | ICD-10-CM

## 2016-11-13 DIAGNOSIS — Z79891 Long term (current) use of opiate analgesic: Secondary | ICD-10-CM | POA: Diagnosis not present

## 2016-11-13 DIAGNOSIS — Z5181 Encounter for therapeutic drug level monitoring: Secondary | ICD-10-CM | POA: Insufficient documentation

## 2016-11-13 DIAGNOSIS — G8929 Other chronic pain: Secondary | ICD-10-CM

## 2016-11-13 DIAGNOSIS — E1142 Type 2 diabetes mellitus with diabetic polyneuropathy: Secondary | ICD-10-CM | POA: Insufficient documentation

## 2016-11-13 DIAGNOSIS — F1190 Opioid use, unspecified, uncomplicated: Secondary | ICD-10-CM | POA: Diagnosis not present

## 2016-11-13 MED ORDER — OXYCODONE HCL 15 MG PO TABS: 15.0000 mg | ORAL_TABLET | Freq: Every day | ORAL | 0 refills | Status: DC | PRN

## 2016-11-13 MED ORDER — GABAPENTIN 600 MG PO TABS: 1200.0000 mg | ORAL_TABLET | Freq: Four times a day (QID) | ORAL | 0 refills | Status: DC

## 2016-11-13 NOTE — Progress Notes (Signed)
Nursing Pain Medication Assessment:  Safety precautions to be maintained throughout the outpatient stay will include: orient to surroundings, keep bed in low position, maintain call bell within reach at all times, provide assistance with transfer out of bed and ambulation.  Medication Inspection Compliance: Pill count conducted under aseptic conditions, in front of the patient. Neither the pills nor the bottle was removed from the patient's sight at any time. Once count was completed pills were immediately returned to the patient in their original bottle. Pill Count: 13 of 50 pills remain Bottle Appearance: Standard pharmacy container. Clearly labeled. Medication: Oxycodone ER (OxyContin) Filled Date: 33 / 1 / 2017  Patient did not bring pill bottles prescribed from Dr Dossie Arbour.  States he ran out of  Dr Dossie Arbour medication on 11-11-16.  Patient states he takes the oxycodone every 4 hours even though it is prescribed every 6 hours.  We have discussed this at the last few appointments and he insists he will continue to take it every 4 hours because he is the one in pain.  Patient had partial toe amputation on October and was given prescription by Dr Illene Silver from the hospital.

## 2016-11-13 NOTE — Patient Instructions (Addendum)
Steps to Quit Smoking Smoking tobacco can be harmful to your health and can affect almost every organ in your body. Smoking puts you, and those around you, at risk for developing many serious chronic diseases. Quitting smoking is difficult, but it is one of the best things that you can do for your health. It is never too late to quit. What are the benefits of quitting smoking? When you quit smoking, you lower your risk of developing serious diseases and conditions, such as:  Lung cancer or lung disease, such as COPD.  Heart disease.  Stroke.  Heart attack.  Infertility.  Osteoporosis and bone fractures. Additionally, symptoms such as coughing, wheezing, and shortness of breath may get better when you quit. You may also find that you get sick less often because your body is stronger at fighting off colds and infections. If you are pregnant, quitting smoking can help to reduce your chances of having a baby of low birth weight. How do I get ready to quit? When you decide to quit smoking, create a plan to make sure that you are successful. Before you quit:  Pick a date to quit. Set a date within the next two weeks to give you time to prepare.  Write down the reasons why you are quitting. Keep this list in places where you will see it often, such as on your bathroom mirror or in your car or wallet.  Identify the people, places, things, and activities that make you want to smoke (triggers) and avoid them. Make sure to take these actions:  Throw away all cigarettes at home, at work, and in your car.  Throw away smoking accessories, such as Scientist, research (medical).  Clean your car and make sure to empty the ashtray.  Clean your home, including curtains and carpets.  Tell your family, friends, and coworkers that you are quitting. Support from your loved ones can make quitting easier.  Talk with your health care provider about your options for quitting smoking.  Find out what treatment  options are covered by your health insurance. What strategies can I use to quit smoking? Talk with your healthcare provider about different strategies to quit smoking. Some strategies include:  Quitting smoking altogether instead of gradually lessening how much you smoke over a period of time. Research shows that quitting "cold [place]" is more successful than gradually quitting.  Attending in-person counseling to help you build problem-solving skills. You are more likely to have success in quitting if you attend several counseling sessions. Even short sessions of 10 minutes can be effective.  Finding resources and support systems that can help you to quit smoking and remain smoke-free after you quit. These resources are most helpful when you use them often. They can include:  Online chats with a Social worker.  Telephone quitlines.  Printed Furniture conservator/restorer.  Support groups or group counseling.  Text messaging programs.  Mobile phone applications.  Taking medicines to help you quit smoking. (If you are pregnant or breastfeeding, talk with your health care provider first.) Some medicines contain nicotine and some do not. Both types of medicines help with cravings, but the medicines that include nicotine help to relieve withdrawal symptoms. Your health care provider may recommend:  Nicotine patches, gum, or lozenges.  Nicotine inhalers or sprays.  Non-nicotine medicine that is taken by mouth. Talk with your health care provider about combining strategies, such as taking medicines while you are also receiving in-person counseling. Using these two strategies together makes you more  likely to succeed in quitting than if you used either strategy on its own. If you are pregnant or breastfeeding, talk with your health care provider about finding counseling or other support strategies to quit smoking. Do not take medicine to help you quit smoking unless told to do so by your health care  provider. What things can I do to make it easier to quit? Quitting smoking might feel overwhelming at first, but there is a lot that you can do to make it easier. Take these important actions:  Reach out to your family and friends and ask that they support and encourage you during this time. Call telephone quitlines, reach out to support groups, or work with a counselor for support.  Ask people who smoke to avoid smoking around you.  Avoid places that trigger you to smoke, such as bars, parties, or smoke-break areas at work.  Spend time around people who do not smoke.  Lessen stress in your life, because stress can be a smoking trigger for some people. To lessen stress, try:  Exercising regularly.  Deep-breathing exercises.  Yoga.  Meditating.  Performing a body scan. This involves closing your eyes, scanning your body from head to toe, and noticing which parts of your body are particularly tense. Purposefully relax the muscles in those areas.  Download or purchase mobile phone or tablet apps (applications) that can help you stick to your quit plan by providing reminders, tips, and encouragement. There are many free apps, such as QuitGuide from the [employer] Office manager for Disease Control and Prevention). You can find other support for quitting smoking (smoking cessation) through smokefree.gov and other websites. How will I feel when I quit smoking? Within the first 24 hours of quitting smoking, you may start to feel some withdrawal symptoms. These symptoms are usually most noticeable 2-3 days after quitting, but they usually do not last beyond 2-3 weeks. Changes or symptoms that you might experience include:  Mood swings.  Restlessness, anxiety, or irritation.  Difficulty concentrating.  Dizziness.  Strong cravings for sugary foods in addition to nicotine.  Mild weight gain.  Constipation.  Nausea.  Coughing or a sore throat.  Changes in how your medicines work in your  body.  A depressed mood.  Difficulty sleeping (insomnia). After the first 2-3 weeks of quitting, you may start to notice more positive results, such as:  Improved sense of smell and taste.  Decreased coughing and sore throat.  Slower heart rate.  Lower blood pressure.  Clearer skin.  The ability to breathe more easily.  Fewer sick days. Quitting smoking is very challenging for most people. Do not get discouraged if you are not successful the first time. Some people need to make many attempts to quit before they achieve long-term success. Do your best to stick to your quit plan, and talk with your health care provider if you have any questions or concerns. This information is not intended to replace advice given to you by your health care provider. Make sure you discuss any questions you have with your health care provider. Document Released: 12/09/2001 Document Revised: 08/12/2016 Document Reviewed: 05/01/2015 Elsevier Interactive Patient Education  2017 [employer]. Pain Management Discharge Instructions  General Discharge Instructions :  If you need to reach your doctor call: Monday-Friday 8:00 am - 4:00 pm at [phone] or toll free [phone].  After clinic hours [phone] to have operator reach doctor.  Bring all of your medication bottles to all your appointments in the pain clinic.  To cancel or reschedule your appointment with Pain Management please remember to call 24 hours in advance to avoid a fee.  Refer to the educational materials which you have been given on: General Risks, I had my Procedure. Discharge Instructions, Post Sedation.  Post Procedure Instructions:  The drugs you were given will stay in your system until tomorrow, so for the next 24 hours you should not drive, make any legal decisions or drink any alcoholic beverages.  You may eat anything you prefer, but it is better to start with liquids then soups and crackers, and gradually work up to  solid foods.  Please notify your doctor immediately if you have any unusual bleeding, trouble breathing or pain that is not related to your normal pain.  Depending on the type of procedure that was done, some parts of your body may feel week and/or numb.  This usually clears up by tonight or the next day.  Walk with the use of an assistive device or accompanied by an adult for the 24 hours.  You may use ice on the affected area for the first 24 hours.  Put ice in a Ziploc bag and cover with a towel and place against area 15 minutes on 15 minutes off.  You may switch to heat after 24 hours.

## 2016-11-13 NOTE — Progress Notes (Signed)
Patient's Name: Willie Krueger  MRN: [id number]  Referring Provider: [facility]  DOB: 07/23/1956  PCP: [facility]  DOS: 11/13/2016  Note by: Kathlen Brunswick. Dossie Arbour, MD  Service setting: Ambulatory outpatient  Specialty: Interventional Pain Management  Location: [facility] (AMB) Pain Management Facility    Patient type: Established   Primary Reason(s) for Visit: Encounter for prescription drug management (Level of risk: moderate) CC: Foot Pain (left) and Back Pain (low)  HPI  Willie Krueger is a 60 y.o. year old, male patient, who comes today for a medication management evaluation. He has Toe ulcer ([place]); Cervical spinal cord compression ([place]); Cardiac murmur; Chronic hepatitis C virus infection ([place]); HLD (hyperlipidemia); Benign hypertension; ED (erectile dysfunction) of organic origin; MI (mitral incompetence); Adiposity; Current smoker; History of decompression of median nerve; Chronic pain; Long term current use of opiate analgesic; Long term prescription opiate use; Opiate use (112.5 MME/Day); Opiate dependence ([place]); Encounter for therapeutic drug level monitoring; Chronic neck pain (Right side); Cervical spondylosis; Cervical post-laminectomy syndrome; Chronic low back pain (Location of Primary Source of Pain) (Midline pain) (R>L); Chronic cervical radicular pain (Bilateral) (R>L); Chronic lumbar radicular pain (Bilateral) (R>L); Substance use disorder Risk: LOW; Failed back surgical syndrome; Chronic lower extremity pain (Location of Secondary source of pain) (Bilateral) (R>L); History of carpal tunnel release of both wrists; Grade I anterolisthesis of C7 on T1; Encounter for chronic pain management; Elevated C-reactive protein (CRP); Diabetic osteomyelitis ([place]); Neuropathy ([place]); Obesity; Neurogenic pain; Type 2 diabetes mellitus with foot ulcer (CODE) ([place]); Diabetic peripheral neuropathy ([place]); Chronic shoulder pain (Location of Tertiary source of pain) (Bilateral) (R>L); Lumbar facet  syndrome (Location of Primary Source of Pain) (Bilateral) (R>L); Abnormal MRI, lumbar spine (05/19/2016); Lumbar spondylosis; and Diabetic infection of left foot ([place]) on his problem list. His primarily concern today is the Foot Pain (left) and Back Pain (low)  Pain Assessment: Self-Reported Pain Score: 4 /10             Reported level is compatible with observation.       Pain Type: Chronic pain Pain Location: Back Pain Orientation: Lower Pain Descriptors / Indicators: Aching Pain Frequency: Constant  Willie Krueger was last seen on 08/11/2016 for medication management. During today's appointment we reviewed Willie Krueger chronic pain status, as well as his outpatient medication regimen. Today I had a very long conversation with regards to the patient's use of his narcotics. He was informed that he is not allowed to take the medications as he has been taking them. He can take the medication a maximum of 5 per day and not every 4 hours like he was taking it. The patient was warned that if he continues to do this, we will simply stop the opioids. In addition, he had a toe amputation and had some narcotics prescribed to him by Dr. Nicholes Mango. The patient did not tell us about this until he arrived to the clinics today. He has been given a final warning with regards to obtaining medications from somebody else. In addition, I went over the exceptions to this rule and I was very clear to point now that he needed to call, as soon as possible to inform us about the medication so that we can tell him if it's okay. Otherwise, he is risking taking more medication than is safe and going into respiratory arrest and death.   The patient  reports that he does not use drugs. His body mass index is 29.4 kg/m.  Further details  on both, my assessment(s), as well as the proposed treatment plan, please see below.  Controlled Substance Pharmacotherapy Assessment REMS (Risk Evaluation and Mitigation Strategy)    Analgesic:Oxycodone IR 15 mg 5 times a day (75 mg/day). MME/day:112.5 mg/day Willie Jarred, RN  11/13/2016 10:28 AM  Signed Nursing Pain Medication Assessment:  Safety precautions to be maintained throughout the outpatient stay will include: orient to surroundings, keep bed in low position, maintain call bell within reach at all times, provide assistance with transfer out of bed and ambulation.  Medication Inspection Compliance: Pill count conducted under aseptic conditions, in front of the patient. Neither the pills nor the bottle was removed from the patient's sight at any time. Once count was completed pills were immediately returned to the patient in their original bottle. Pill Count: 13 of 50 pills remain Bottle Appearance: Standard pharmacy container. Clearly labeled. Medication: Oxycodone ER (OxyContin) Filled Date: 15 / 1 / 2017  Patient did not bring pill bottles prescribed from Dr Dossie Arbour.  States he ran out of  Dr Dossie Arbour medication on 11-11-16.  Patient states he takes the oxycodone every 4 hours even though it is prescribed every 6 hours.  We have discussed this at the last few appointments and he insists he will continue to take it every 4 hours because he is the one in pain.  Patient had partial toe amputation on October and was given prescription by Dr Illene Silver from the hospital.     Pharmacokinetics: Liberation and absorption (onset of action): WNL Distribution (time to peak effect): WNL Metabolism and excretion (duration of action): WNL         Pharmacodynamics: Desired effects: Analgesia: [name] reports >50% benefit. Functional ability: Patient reports that medication allows him to accomplish basic ADLs Clinically meaningful improvement in function (CMIF): Sustained CMIF goals met Perceived effectiveness: Described as relatively effective, allowing for increase in activities of daily living (ADL) Undesirable effects: Side-effects or Adverse reactions: None  reported Monitoring: [place] PMP: Online review of the past 9-monthperiod conducted. Non-compliant List of all UDS test(s) done:  Lab Results  Component Value Date   TOXASSSELUR FINAL 05/28/2016   TOXASSSELUR FINAL 02/27/2016   TOXASSSELUR FINAL 12/03/2015   TOXASSSELUR FINAL 10/31/2015   Last UDS on record: ToxAssure Select 13  Date Value Ref Range Status  05/28/2016 FINAL  Final    Comment:    ==================================================================== TOXASSURE SELECT 13 (MW) ==================================================================== Test                             Result       Flag       Units Drug Present and Declared for Prescription Verification   Oxycodone                      >4975        EXPECTED   ng/mg creat   Oxymorphone                    1287         EXPECTED   ng/mg creat   Noroxycodone                   >4975        EXPECTED   ng/mg creat   Noroxymorphone                 354  EXPECTED   ng/mg creat    Sources of oxycodone are scheduled prescription medications.    Oxymorphone, noroxycodone, and noroxymorphone are expected    metabolites of oxycodone. Oxymorphone is also available as a    scheduled prescription medication. ==================================================================== Test                      Result    Flag   Units      Ref Range   Creatinine              201              mg/dL      >=20 ==================================================================== Declared Medications:  The flagging and interpretation on this report are based on the  following declared medications.  Unexpected results may arise from  inaccuracies in the declared medications.  **Note: The testing scope of this panel includes these medications:  Oxycodone (Roxicodone)  **Note: The testing scope of this panel does not include following  reported medications:  Acetaminophen  Acetaminophen (Tylenol PM)  Aspirin  Diphenhydramine (Tylenol  PM)  Furosemide (Lasix)  Gabapentin  Glimepiride (Amaryl)  Lisinopril  Metformin  Naloxone (Narcan)  Pravastatin (Pravachol)  Sildenafil (Viagra)  Vitamin D3 ==================================================================== For clinical consultation, please call [phone]. ====================================================================    UDS interpretation: Compliant          Medication Assessment Form: Reviewed. Patient indicates being compliant with therapy Treatment compliance: Compliant Risk Assessment Profile: Aberrant behavior: See prior evaluations. None observed or detected today Comorbid factors increasing risk of overdose: See prior notes. No additional risks detected today Risk of substance use disorder (SUD): Very High Opioid Risk Tool (ORT) Total Score: 0  Interpretation Table:  Score <3 = Low Risk for SUD  Score between 4-7 = Moderate Risk for SUD  Score >8 = High Risk for Opioid Abuse   Risk Mitigation Strategies:  Patient Counseling: Covered Patient-Prescriber Agreement (PPA): Present and active  Notification to other healthcare providers: Done  Pharmacologic Plan: No change in therapy, at this time  Laboratory Chemistry  Inflammation Markers Lab Results  Component Value Date   ESRSEDRATE 82 (H) 10/13/2016   CRP 28.9 (H) 10/13/2016   Renal Function Lab Results  Component Value Date   BUN 14 10/15/2016   CREATININE 0.89 10/15/2016   GFRAA >60 10/15/2016   GFRNONAA >60 10/15/2016   Hepatic Function Lab Results  Component Value Date   AST 23 03/04/2016   ALT 23 03/04/2016   ALBUMIN 3.4 (L) 03/04/2016   Electrolytes Lab Results  Component Value Date   NA 138 10/15/2016   K 4.4 10/15/2016   CL 107 10/15/2016   CALCIUM 8.7 (L) 10/15/2016   MG 1.9 02/27/2016   Pain Modulating Vitamins No results found for: VD25OH, SE831DV7OHY, WV3710GY6, RS8546EV0, 25OHVITD1, 25OHVITD2, 25OHVITD3, VITAMINB12 Coagulation Parameters Lab  Results  Component Value Date   PLT 258 10/15/2016   Cardiovascular Lab Results  Component Value Date   BNP 400 (H) 01/02/2014   HGB 12.7 (L) 10/15/2016   HCT 37.5 (L) 10/15/2016   Note: Lab results reviewed.  Recent Diagnostic Imaging Review  US Venous Img Upper Uni Left  Result Date: 11/05/2016 CLINICAL DATA:  60 year old male with left upper extremity arm swelling with a PICC line in place EXAM: LEFT UPPER EXTREMITY VENOUS DOPPLER ULTRASOUND TECHNIQUE: Gray-scale sonography with graded compression, as well as color Doppler and duplex ultrasound were performed to evaluate the upper extremity deep venous system from the level  of the subclavian vein and including the jugular, axillary, basilic, radial, ulnar and upper cephalic vein. Spectral Doppler was utilized to evaluate flow at rest and with distal augmentation maneuvers. COMPARISON:  None. FINDINGS: Contralateral Subclavian Vein: Respiratory phasicity is normal and symmetric with the symptomatic side. No evidence of thrombus. Normal compressibility. Internal Jugular Vein: No evidence of thrombus. Normal compressibility, respiratory phasicity and response to augmentation. Subclavian Vein: Low-level internal echoes within the vessel lumen surrounding the PICC consistent with thrombus. No evidence of color flow on color Doppler imaging consistent with occlusive thrombus. Axillary Vein: Thrombus extends into the axillary vein. Cephalic Vein: No evidence of thrombus. Normal compressibility, respiratory phasicity and response to augmentation. Basilic Vein: No evidence of thrombus. Normal compressibility, respiratory phasicity and response to augmentation. Brachial Veins: Thrombus extends into the brachial vein containing the PICC. The other brachial vein is patent and compressible. Radial Veins: No evidence of thrombus. Normal compressibility, respiratory phasicity and response to augmentation. Ulnar Veins: No evidence of thrombus. Normal  compressibility, respiratory phasicity and response to augmentation. Venous Reflux:  None visualized. Other Findings:  None visualized. IMPRESSION: Positive for DVT along the course of the left upper extremity PICC including 1 of the paired brachial veins, the axillary vein and the subclavian vein. These results were called by telephone at the time of interpretation on 11/05/2016 at 10:56 am to Dr. Adrian Prows , who verbally acknowledged these results. Electronically Signed   By: Jacqulynn Cadet M.D.   On: 11/05/2016 10:56   Note: Imaging results reviewed.  Meds  The patient has a current medication list which includes the following prescription(s): acetaminophen, aspirin ec, atorvastatin, vitamin d3, docusate sodium, furosemide, gabapentin, glimepiride, lisinopril, metformin, oxycodone, oxycodone, piperacillin-tazobactam, sildenafil, and xarelto starter pack.  Current Outpatient Prescriptions on File Prior to Visit  Medication Sig  . aspirin EC 81 MG tablet Take 81 mg by mouth daily.  Marland Kitchen atorvastatin (LIPITOR) 40 MG tablet Take 1 tablet (40 mg total) by mouth daily at 6 PM.  . Cholecalciferol (VITAMIN D3) 2000 units capsule Take 2,000 Units by mouth daily.   Marland Kitchen docusate sodium (COLACE) 100 MG capsule Take 1 capsule (100 mg total) by mouth 2 (two) times daily.  . furosemide (LASIX) 20 MG tablet Take 40 mg by mouth 2 (two) times daily.   Marland Kitchen glimepiride (AMARYL) 2 MG tablet Take 2 mg by mouth daily with breakfast.   . lisinopril (PRINIVIL,ZESTRIL) 40 MG tablet Take 40 mg by mouth daily.  . metFORMIN (GLUCOPHAGE-XR) 500 MG 24 hr tablet Take 500 mg by mouth 2 (two) times daily.  . piperacillin-tazobactam (ZOSYN) 3.375 GM/50ML IVPB Inject 50 mLs (3.375 g total) into the vein every 8 (eight) hours.  . sildenafil (VIAGRA) 100 MG tablet Take 100 mg by mouth daily as needed for erectile dysfunction.   No current facility-administered medications on file prior to visit.    ROS  Constitutional: Denies  any fever or chills Gastrointestinal: No reported hemesis, hematochezia, vomiting, or acute GI distress Musculoskeletal: Denies any acute onset joint swelling, redness, loss of ROM, or weakness Neurological: No reported episodes of acute onset apraxia, aphasia, dysarthria, agnosia, amnesia, paralysis, loss of coordination, or loss of consciousness  Allergies  Mr. Odonell is allergic to cleocin [clindamycin]; clindamycin phosphate; other; and sulfa antibiotics.  [place]  Drug: Mr. Rockett  reports that he does not use drugs. Alcohol:  reports that he does not drink alcohol. Tobacco:  reports that he has been smoking Cigarettes.  He has been smoking about  1.50 packs per day. He has never used smokeless tobacco. Medical:  has a past medical history of Chronic pain; Chronic pain associated with significant psychosocial dysfunction (05/04/2015); Diabetes mellitus without complication ([place]); Heart murmur; History of spinal stenosis (10/31/2015); Hyperlipidemia; Hypertension; Leg weakness (07/07/2014); Mitral valve regurgitation; Nerve root inflammation (06/26/2014); and Osteoarthritis. Family: family history includes Cancer in his mother; Diabetes in his mother; Heart disease in his father.  Past Surgical History:  Procedure Laterality Date  . AMPUTATION TOE Left 10/2016   partial  . CARPAL TUNNEL RELEASE    . IRRIGATION AND DEBRIDEMENT FOOT Left 10/13/2016   Procedure: IRRIGATION AND DEBRIDEMENT FOOT;  Surgeon: Samara Deist, DPM;  Location: [facility] ORS;  Service: Podiatry;  Laterality: Left;  . PICC LINE PLACE PERIPHERAL ([place] HX) Left [id number]  . SPINE SURGERY     Constitutional Exam  General appearance: Well nourished, well developed, and well hydrated. In no apparent acute distress Vitals:   11/13/16 1006  BP: (!) 157/86  Pulse: 87  Resp: 18  Temp: 98 F (36.7 C)  SpO2: 96%  Weight: 229 lb (103.9 kg)  Height: 6' 2"  (1.88 m)   BMI Assessment: Estimated body mass index is 29.4 kg/m as calculated  from the following:   Height as of this encounter: 6' 2"  (1.88 m).   Weight as of this encounter: 229 lb (103.9 kg).  BMI interpretation table: BMI level Category Range association with higher incidence of chronic pain  <18 kg/m2 Underweight   18.5-24.9 kg/m2 Ideal body weight   25-29.9 kg/m2 Overweight Increased incidence by 20%  30-34.9 kg/m2 Obese (Class I) Increased incidence by 68%  35-39.9 kg/m2 Severe obesity (Class II) Increased incidence by 136%  >40 kg/m2 Extreme obesity (Class III) Increased incidence by 254%   BMI Readings from Last 4 Encounters:  11/13/16 29.40 kg/m  10/16/16 28.35 kg/m  08/11/16 30.43 kg/m  07/29/16 30.43 kg/m   Wt Readings from Last 4 Encounters:  11/13/16 229 lb (103.9 kg)  10/16/16 220 lb 12.8 oz (100.2 kg)  08/11/16 237 lb (107.5 kg)  07/29/16 237 lb (107.5 kg)  Psych/Mental status: Alert, oriented x 3 (person, place, & time) Eyes: PERLA Respiratory: No evidence of acute respiratory distress  Cervical Spine Exam  Inspection: No masses, redness, or swelling Alignment: Symmetrical Functional ROM: Unrestricted ROM Stability: No instability detected Muscle strength & Tone: Functionally intact Sensory: Unimpaired Palpation: Non-contributory  Upper Extremity (UE) Exam    Side: Right upper extremity  Side: Left upper extremity  Inspection: No masses, redness, swelling, or asymmetry  Inspection: No masses, redness, swelling, or asymmetry  Functional ROM: Unrestricted ROM          Functional ROM: Unrestricted ROM          Muscle strength & Tone: Functionally intact  Muscle strength & Tone: Functionally intact  Sensory: Unimpaired  Sensory: Unimpaired  Palpation: Non-contributory  Palpation: Non-contributory   Thoracic Spine Exam  Inspection: No masses, redness, or swelling Alignment: Symmetrical Functional ROM: Unrestricted ROM Stability: No instability detected Sensory: Unimpaired Muscle strength & Tone: Functionally intact Palpation:  Non-contributory  Lumbar Spine Exam  Inspection: No masses, redness, or swelling Alignment: Symmetrical Functional ROM: Unrestricted ROM Stability: No instability detected Muscle strength & Tone: Functionally intact Sensory: Unimpaired Palpation: Non-contributory Provocative Tests: Lumbar Hyperextension and rotation test: evaluation deferred today       Patrick's Maneuver: evaluation deferred today              Gait & Posture Assessment  Ambulation:  Unassisted Gait: Relatively normal for age and body habitus Posture: WNL   Lower Extremity Exam    Side: Right lower extremity  Side: Left lower extremity  Inspection: No masses, redness, swelling, or asymmetry  Inspection: No masses, redness, swelling, or asymmetry  Functional ROM: Unrestricted ROM          Functional ROM: Unrestricted ROM          Muscle strength & Tone: Functionally intact  Muscle strength & Tone: Functionally intact  Sensory: Unimpaired  Sensory: Unimpaired  Palpation: Non-contributory  Palpation: Non-contributory   Assessment  Primary Diagnosis & Pertinent Problem List: The primary encounter diagnosis was Chronic pain syndrome. Diagnoses of Long term current use of opiate analgesic, Opiate use (112.5 MME/Day), Chronic low back pain (Location of Primary Source of Pain) (Midline pain) (R>L), Chronic lower extremity pain (Location of Secondary source of pain) (Bilateral) (R>L), Chronic shoulder pain (Location of Tertiary source of pain) (Bilateral) (R>L), and Neurogenic pain were also pertinent to this visit.  Visit Diagnosis: 1. Chronic pain syndrome   2. Long term current use of opiate analgesic   3. Opiate use (112.5 MME/Day)   4. Chronic low back pain (Location of Primary Source of Pain) (Midline pain) (R>L)   5. Chronic lower extremity pain (Location of Secondary source of pain) (Bilateral) (R>L)   6. Chronic shoulder pain (Location of Tertiary source of pain) (Bilateral) (R>L)   7. Neurogenic pain    Plan  of Care  Pharmacotherapy (Medications Ordered): Meds ordered this encounter  Medications  . oxyCODONE (ROXICODONE) 15 MG immediate release tablet    Sig: Take 1 tablet (15 mg total) by mouth 5 (five) times daily as needed for pain.    Dispense:  150 tablet    Refill:  0    Do not place this medication, or any other prescription from our practice, on "Automatic Refill". Patient may have prescription filled one day early if pharmacy is closed on scheduled refill date. Do not fill until: 11/15/16 To last until: 12/15/16  . gabapentin (NEURONTIN) 600 MG tablet    Sig: Take 2 tablets (1,200 mg total) by mouth every 6 (six) hours.    Dispense:  240 tablet    Refill:  0    Do not place this medication, or any other prescription from our practice, on "Automatic Refill". Patient may have prescription filled one day early if pharmacy is closed on scheduled refill date.   New Prescriptions   No medications on file   Medications administered today: Mr. Redmon had no medications administered during this visit. Lab-work, procedure(s), and/or referral(s): Orders Placed This Encounter  Procedures  . ToxASSURE Select 13 (MW), Urine   Imaging and/or referral(s): None  Interventional therapies: Planned, scheduled, and/or pending:   Right Lumbar facet RFA.   Considering:   Right Lumbar facet RFA.   Palliative PRN treatment(s):   Not at this time.   Provider-requested follow-up: Return in about 1 month (around 12/13/2016) for Med-Mgmt, procedure, (ASAP).  Future Appointments Date Time Provider [place]  12/05/2016 12:45 PM Milinda Pointer, MD [facility] None   Primary Care Physician: [facility] Location: [facility] Note by: Desaray Marschner A. Dossie Arbour, M.D, DABA, DABAPM, DABPM, DABIPP, FIPP  Pain Score Disclaimer: We use the NRS-11 scale. This is a self-reported, subjective measurement of pain severity with only modest accuracy. It is used  primarily to identify changes within a particular patient. It must be understood that outpatient pain scales are significantly less  accurate that those used for research, where they can be applied under ideal controlled circumstances with minimal exposure to variables. In reality, the score is likely to be a combination of pain intensity and pain affect, where pain affect describes the degree of emotional arousal or changes in action readiness caused by the sensory experience of pain. Factors such as social and work situation, setting, emotional state, anxiety levels, expectation, and prior pain experience may influence pain perception and show large inter-individual differences that may also be affected by time variables.  Patient instructions provided during this appointment: Patient Instructions  Steps to Quit Smoking Smoking tobacco can be harmful to your health and can affect almost every organ in your body. Smoking puts you, and those around you, at risk for developing many serious chronic diseases. Quitting smoking is difficult, but it is one of the best things that you can do for your health. It is never too late to quit. What are the benefits of quitting smoking? When you quit smoking, you lower your risk of developing serious diseases and conditions, such as:  Lung cancer or lung disease, such as COPD.  Heart disease.  Stroke.  Heart attack.  Infertility.  Osteoporosis and bone fractures. Additionally, symptoms such as coughing, wheezing, and shortness of breath may get better when you quit. You may also find that you get sick less often because your body is stronger at fighting off colds and infections. If you are pregnant, quitting smoking can help to reduce your chances of having a baby of low birth weight. How do I get ready to quit? When you decide to quit smoking, create a plan to make sure that you are successful. Before you quit:  Pick a date to quit. Set a date within the  next two weeks to give you time to prepare.  Write down the reasons why you are quitting. Keep this list in places where you will see it often, such as on your bathroom mirror or in your car or wallet.  Identify the people, places, things, and activities that make you want to smoke (triggers) and avoid them. Make sure to take these actions:  Throw away all cigarettes at home, at work, and in your car.  Throw away smoking accessories, such as Scientist, research (medical).  Clean your car and make sure to empty the ashtray.  Clean your home, including curtains and carpets.  Tell your family, friends, and coworkers that you are quitting. Support from your loved ones can make quitting easier.  Talk with your health care provider about your options for quitting smoking.  Find out what treatment options are covered by your health insurance. What strategies can I use to quit smoking? Talk with your healthcare provider about different strategies to quit smoking. Some strategies include:  Quitting smoking altogether instead of gradually lessening how much you smoke over a period of time. Research shows that quitting "cold [place]" is more successful than gradually quitting.  Attending in-person counseling to help you build problem-solving skills. You are more likely to have success in quitting if you attend several counseling sessions. Even short sessions of 10 minutes can be effective.  Finding resources and support systems that can help you to quit smoking and remain smoke-free after you quit. These resources are most helpful when you use them often. They can include:  Online chats with a Social worker.  Telephone quitlines.  Printed Furniture conservator/restorer.  Support groups or group counseling.  Text messaging programs.  Mobile phone  applications.  Taking medicines to help you quit smoking. (If you are pregnant or breastfeeding, talk with your health care provider first.) Some medicines contain  nicotine and some do not. Both types of medicines help with cravings, but the medicines that include nicotine help to relieve withdrawal symptoms. Your health care provider may recommend:  Nicotine patches, gum, or lozenges.  Nicotine inhalers or sprays.  Non-nicotine medicine that is taken by mouth. Talk with your health care provider about combining strategies, such as taking medicines while you are also receiving in-person counseling. Using these two strategies together makes you more likely to succeed in quitting than if you used either strategy on its own. If you are pregnant or breastfeeding, talk with your health care provider about finding counseling or other support strategies to quit smoking. Do not take medicine to help you quit smoking unless told to do so by your health care provider. What things can I do to make it easier to quit? Quitting smoking might feel overwhelming at first, but there is a lot that you can do to make it easier. Take these important actions:  Reach out to your family and friends and ask that they support and encourage you during this time. Call telephone quitlines, reach out to support groups, or work with a counselor for support.  Ask people who smoke to avoid smoking around you.  Avoid places that trigger you to smoke, such as bars, parties, or smoke-break areas at work.  Spend time around people who do not smoke.  Lessen stress in your life, because stress can be a smoking trigger for some people. To lessen stress, try:  Exercising regularly.  Deep-breathing exercises.  Yoga.  Meditating.  Performing a body scan. This involves closing your eyes, scanning your body from head to toe, and noticing which parts of your body are particularly tense. Purposefully relax the muscles in those areas.  Download or purchase mobile phone or tablet apps (applications) that can help you stick to your quit plan by providing reminders, tips, and encouragement.  There are many free apps, such as QuitGuide from the [employer] Office manager for Disease Control and Prevention). You can find other support for quitting smoking (smoking cessation) through smokefree.gov and other websites. How will I feel when I quit smoking? Within the first 24 hours of quitting smoking, you may start to feel some withdrawal symptoms. These symptoms are usually most noticeable 2-3 days after quitting, but they usually do not last beyond 2-3 weeks. Changes or symptoms that you might experience include:  Mood swings.  Restlessness, anxiety, or irritation.  Difficulty concentrating.  Dizziness.  Strong cravings for sugary foods in addition to nicotine.  Mild weight gain.  Constipation.  Nausea.  Coughing or a sore throat.  Changes in how your medicines work in your body.  A depressed mood.  Difficulty sleeping (insomnia). After the first 2-3 weeks of quitting, you may start to notice more positive results, such as:  Improved sense of smell and taste.  Decreased coughing and sore throat.  Slower heart rate.  Lower blood pressure.  Clearer skin.  The ability to breathe more easily.  Fewer sick days. Quitting smoking is very challenging for most people. Do not get discouraged if you are not successful the first time. Some people need to make many attempts to quit before they achieve long-term success. Do your best to stick to your quit plan, and talk with your health care provider if you have any questions or concerns. This  information is not intended to replace advice given to you by your health care provider. Make sure you discuss any questions you have with your health care provider. Document Released: 12/09/2001 Document Revised: 08/12/2016 Document Reviewed: 05/01/2015 Elsevier Interactive Patient Education  2017 [place]. Pain Management Discharge Instructions  General Discharge Instructions :  If you need to reach your doctor call: Monday-Friday 8:00 am  - 4:00 pm at [phone] or toll free [phone].  After clinic hours [phone] to have operator reach doctor.  Bring all of your medication bottles to all your appointments in the pain clinic.  To cancel or reschedule your appointment with Pain Management please remember to call 24 hours in advance to avoid a fee.  Refer to the educational materials which you have been given on: General Risks, I had my Procedure. Discharge Instructions, Post Sedation.  Post Procedure Instructions:  The drugs you were given will stay in your system until tomorrow, so for the next 24 hours you should not drive, make any legal decisions or drink any alcoholic beverages.  You may eat anything you prefer, but it is better to start with liquids then soups and crackers, and gradually work up to solid foods.  Please notify your doctor immediately if you have any unusual bleeding, trouble breathing or pain that is not related to your normal pain.  Depending on the type of procedure that was done, some parts of your body may feel week and/or numb.  This usually clears up by tonight or the next day.  Walk with the use of an assistive device or accompanied by an adult for the 24 hours.  You may use ice on the affected area for the first 24 hours.  Put ice in a Ziploc bag and cover with a towel and place against area 15 minutes on 15 minutes off.  You may switch to heat after 24 hours.

## 2016-11-14 DIAGNOSIS — E11621 Type 2 diabetes mellitus with foot ulcer: Secondary | ICD-10-CM | POA: Insufficient documentation

## 2016-11-14 DIAGNOSIS — E1169 Type 2 diabetes mellitus with other specified complication: Secondary | ICD-10-CM

## 2016-11-14 DIAGNOSIS — L97509 Non-pressure chronic ulcer of other part of unspecified foot with unspecified severity: Secondary | ICD-10-CM

## 2016-11-14 DIAGNOSIS — M869 Osteomyelitis, unspecified: Secondary | ICD-10-CM

## 2016-11-21 ENCOUNTER — Encounter: Payer: Self-pay | Admitting: Emergency Medicine

## 2016-11-21 ENCOUNTER — Observation Stay
Admission: EM | Admit: 2016-11-21 | Discharge: 2016-11-22 | Disposition: A | Discharge status: Home / Self Care | Payer: 59 | Attending: Internal Medicine | Admitting: Internal Medicine

## 2016-11-21 ENCOUNTER — Emergency Department: Payer: 59

## 2016-11-21 DIAGNOSIS — F17210 Nicotine dependence, cigarettes, uncomplicated: Secondary | ICD-10-CM | POA: Diagnosis not present

## 2016-11-21 DIAGNOSIS — E1169 Type 2 diabetes mellitus with other specified complication: Secondary | ICD-10-CM | POA: Insufficient documentation

## 2016-11-21 DIAGNOSIS — R0600 Dyspnea, unspecified: Secondary | ICD-10-CM

## 2016-11-21 DIAGNOSIS — E1142 Type 2 diabetes mellitus with diabetic polyneuropathy: Secondary | ICD-10-CM | POA: Insufficient documentation

## 2016-11-21 DIAGNOSIS — M1990 Unspecified osteoarthritis, unspecified site: Secondary | ICD-10-CM | POA: Diagnosis not present

## 2016-11-21 DIAGNOSIS — B182 Chronic viral hepatitis C: Secondary | ICD-10-CM | POA: Insufficient documentation

## 2016-11-21 DIAGNOSIS — Z7901 Long term (current) use of anticoagulants: Secondary | ICD-10-CM | POA: Diagnosis not present

## 2016-11-21 DIAGNOSIS — M869 Osteomyelitis, unspecified: Secondary | ICD-10-CM | POA: Diagnosis not present

## 2016-11-21 DIAGNOSIS — G894 Chronic pain syndrome: Secondary | ICD-10-CM | POA: Insufficient documentation

## 2016-11-21 DIAGNOSIS — Z881 Allergy status to other antibiotic agents status: Secondary | ICD-10-CM | POA: Insufficient documentation

## 2016-11-21 DIAGNOSIS — Z880 Allergy status to penicillin: Secondary | ICD-10-CM | POA: Diagnosis not present

## 2016-11-21 DIAGNOSIS — N529 Male erectile dysfunction, unspecified: Secondary | ICD-10-CM | POA: Insufficient documentation

## 2016-11-21 DIAGNOSIS — I340 Nonrheumatic mitral (valve) insufficiency: Secondary | ICD-10-CM | POA: Diagnosis not present

## 2016-11-21 DIAGNOSIS — R0602 Shortness of breath: Secondary | ICD-10-CM | POA: Diagnosis not present

## 2016-11-21 DIAGNOSIS — Z888 Allergy status to other drugs, medicaments and biological substances status: Secondary | ICD-10-CM | POA: Insufficient documentation

## 2016-11-21 DIAGNOSIS — I700 Atherosclerosis of aorta: Secondary | ICD-10-CM | POA: Diagnosis not present

## 2016-11-21 DIAGNOSIS — R21 Rash and other nonspecific skin eruption: Secondary | ICD-10-CM | POA: Diagnosis not present

## 2016-11-21 DIAGNOSIS — Z882 Allergy status to sulfonamides status: Secondary | ICD-10-CM | POA: Insufficient documentation

## 2016-11-21 DIAGNOSIS — I10 Essential (primary) hypertension: Secondary | ICD-10-CM | POA: Diagnosis not present

## 2016-11-21 DIAGNOSIS — Z7982 Long term (current) use of aspirin: Secondary | ICD-10-CM | POA: Diagnosis not present

## 2016-11-21 DIAGNOSIS — E785 Hyperlipidemia, unspecified: Secondary | ICD-10-CM | POA: Diagnosis not present

## 2016-11-21 DIAGNOSIS — Z86718 Personal history of other venous thrombosis and embolism: Secondary | ICD-10-CM | POA: Insufficient documentation

## 2016-11-21 DIAGNOSIS — R509 Fever, unspecified: Secondary | ICD-10-CM

## 2016-11-21 DIAGNOSIS — Z89422 Acquired absence of other left toe(s): Secondary | ICD-10-CM | POA: Insufficient documentation

## 2016-11-21 DIAGNOSIS — R011 Cardiac murmur, unspecified: Secondary | ICD-10-CM | POA: Diagnosis not present

## 2016-11-21 DIAGNOSIS — Z889 Allergy status to unspecified drugs, medicaments and biological substances status: Secondary | ICD-10-CM

## 2016-11-21 LAB — COMPREHENSIVE METABOLIC PANEL
ALBUMIN: 3.9 g/dL (ref 3.5–5.0)
ALT: 11 U/L — ABNORMAL LOW (ref 17–63)
ANION GAP: 11 (ref 5–15)
AST: 22 U/L (ref 15–41)
Alkaline Phosphatase: 87 U/L (ref 38–126)
BUN: 17 mg/dL (ref 6–20)
CHLORIDE: 98 mmol/L — AB (ref 101–111)
CO2: 24 mmol/L (ref 22–32)
Calcium: 9.3 mg/dL (ref 8.9–10.3)
Creatinine, Ser: 1.11 mg/dL (ref 0.61–1.24)
GFR calc Af Amer: >60 mL/min (ref >60)
GFR calc non Af Amer: >60 mL/min (ref >60)
GLUCOSE: 217 mg/dL — AB (ref 65–99)
POTASSIUM: 4.2 mmol/L (ref 3.5–5.1)
Sodium: 133 mmol/L — ABNORMAL LOW (ref 135–145)
TOTAL PROTEIN: 8.4 g/dL — AB (ref 6.5–8.1)
Total Bilirubin: 0.6 mg/dL (ref 0.3–1.2)

## 2016-11-21 LAB — CBC WITH DIFFERENTIAL/PLATELET
BASOS ABS: 0 10*3/uL (ref 0–0.1)
BASOS PCT: 0 %
EOS ABS: 1 10*3/uL — AB (ref 0–0.7)
EOS PCT: 10 %
HCT: 45 % (ref 40.0–52.0)
HEMOGLOBIN: 15.3 g/dL (ref 13.0–18.0)
Lymphocytes Relative: 17 %
Lymphs Abs: 1.6 10*3/uL (ref 1.0–3.6)
MCH: 32 pg (ref 26.0–34.0)
MCHC: 33.9 g/dL (ref 32.0–36.0)
MCV: 94.4 fL (ref 80.0–100.0)
Monocytes Absolute: 1.3 10*3/uL — ABNORMAL HIGH (ref 0.2–1.0)
Monocytes Relative: 13 %
NEUTROS PCT: 60 %
Neutro Abs: 5.6 10*3/uL (ref 1.4–6.5)
PLATELETS: 199 10*3/uL (ref 150–440)
RBC: 4.77 MIL/uL (ref 4.40–5.90)
RDW: 14.6 % — ABNORMAL HIGH (ref 11.5–14.5)
WBC: 9.5 10*3/uL (ref 3.8–10.6)

## 2016-11-21 LAB — LACTIC ACID, PLASMA
LACTIC ACID, VENOUS: 1.2 mmol/L (ref 0.5–1.9)
Lactic Acid, Venous: 2 mmol/L (ref 0.5–1.9)

## 2016-11-21 LAB — GLUCOSE, CAPILLARY: GLUCOSE-CAPILLARY: 279 mg/dL — AB (ref 65–99)

## 2016-11-21 LAB — TROPONIN I

## 2016-11-21 LAB — SEDIMENTATION RATE: SED RATE: 10 mm/h (ref 0–20)

## 2016-11-21 LAB — BRAIN NATRIURETIC PEPTIDE: B NATRIURETIC PEPTIDE 5: 86 pg/mL (ref 0.0–100.0)

## 2016-11-21 MED ORDER — DIPHENHYDRAMINE HCL 25 MG PO TABS
25.0000 mg | ORAL_TABLET | Freq: Four times a day (QID) | ORAL | Status: DC | PRN
  Administered 2016-11-21 – 2016-11-22 (×4): 25 mg via ORAL
  Filled 2016-11-21 (×9): qty 1

## 2016-11-21 MED ORDER — SODIUM CHLORIDE 0.9 % IV SOLN
Freq: Once | INTRAVENOUS | Status: AC
  Administered 2016-11-21: 15:00:00 via INTRAVENOUS

## 2016-11-21 MED ORDER — INSULIN ASPART 100 UNIT/ML ~~LOC~~ SOLN
0.0000 [IU] | Freq: Three times a day (TID) | SUBCUTANEOUS | Status: DC
  Administered 2016-11-22: 9 [IU] via SUBCUTANEOUS
  Filled 2016-11-21: qty 9

## 2016-11-21 MED ORDER — LISINOPRIL 20 MG PO TABS
40.0000 mg | ORAL_TABLET | Freq: Every day | ORAL | Status: DC
  Administered 2016-11-22: 08:00:00 40 mg via ORAL
  Filled 2016-11-21: qty 2

## 2016-11-21 MED ORDER — ATORVASTATIN CALCIUM 20 MG PO TABS: 40.0000 mg | ORAL_TABLET | Freq: Every day | ORAL | Status: DC

## 2016-11-21 MED ORDER — ACETAMINOPHEN ER 650 MG PO TBCR: 1300.0000 mg | EXTENDED_RELEASE_TABLET | ORAL | Status: DC

## 2016-11-21 MED ORDER — SODIUM CHLORIDE 0.9 % IV SOLN
500.0000 mg | Freq: Three times a day (TID) | INTRAVENOUS | Status: DC
  Administered 2016-11-21: 500 mg via INTRAVENOUS
  Filled 2016-11-21 (×4): qty 0.5

## 2016-11-21 MED ORDER — INSULIN ASPART 100 UNIT/ML ~~LOC~~ SOLN
0.0000 [IU] | Freq: Every day | SUBCUTANEOUS | Status: DC
  Administered 2016-11-21: 2 [IU] via SUBCUTANEOUS
  Filled 2016-11-21: qty 2

## 2016-11-21 MED ORDER — ALBUTEROL SULFATE (2.5 MG/3ML) 0.083% IN NEBU: 2.5000 mg | INHALATION_SOLUTION | RESPIRATORY_TRACT | Status: DC | PRN

## 2016-11-21 MED ORDER — VITAMIN D 1000 UNITS PO TABS
2000.0000 [IU] | ORAL_TABLET | Freq: Every day | ORAL | Status: DC
  Administered 2016-11-22: 08:00:00 2000 [IU] via ORAL
  Filled 2016-11-21: qty 2

## 2016-11-21 MED ORDER — DOCUSATE SODIUM 100 MG PO CAPS
100.0000 mg | ORAL_CAPSULE | Freq: Two times a day (BID) | ORAL | Status: DC
  Administered 2016-11-21 – 2016-11-22 (×2): 100 mg via ORAL
  Filled 2016-11-21 (×2): qty 1

## 2016-11-21 MED ORDER — ONDANSETRON HCL 4 MG PO TABS: 4.0000 mg | ORAL_TABLET | Freq: Four times a day (QID) | ORAL | Status: DC | PRN

## 2016-11-21 MED ORDER — DIPHENHYDRAMINE HCL 50 MG/ML IJ SOLN
25.0000 mg | Freq: Once | INTRAMUSCULAR | Status: AC
  Administered 2016-11-21: 25 mg via INTRAVENOUS
  Filled 2016-11-21: qty 1

## 2016-11-21 MED ORDER — HYDROCODONE-ACETAMINOPHEN 5-325 MG PO TABS: 1.0000 | ORAL_TABLET | ORAL | Status: DC | PRN

## 2016-11-21 MED ORDER — RIVAROXABAN 20 MG PO TABS: 20.0000 mg | ORAL_TABLET | Freq: Every day | ORAL | Status: DC

## 2016-11-21 MED ORDER — OXYCODONE HCL 5 MG PO TABS
15.0000 mg | ORAL_TABLET | Freq: Every day | ORAL | Status: DC | PRN
  Administered 2016-11-21 – 2016-11-22 (×4): 15 mg via ORAL
  Filled 2016-11-21 (×4): qty 3

## 2016-11-21 MED ORDER — IOPAMIDOL (ISOVUE-370) INJECTION 76%
75.0000 mL | Freq: Once | INTRAVENOUS | Status: AC | PRN
  Administered 2016-11-21: 75 mL via INTRAVENOUS
  Filled 2016-11-21: qty 75

## 2016-11-21 MED ORDER — ACETAMINOPHEN 650 MG RE SUPP: 650.0000 mg | Freq: Four times a day (QID) | RECTAL | Status: DC | PRN

## 2016-11-21 MED ORDER — METHYLPREDNISOLONE SODIUM SUCC 125 MG IJ SOLR
60.0000 mg | Freq: Four times a day (QID) | INTRAMUSCULAR | Status: DC
  Administered 2016-11-21 – 2016-11-22 (×3): 60 mg via INTRAVENOUS
  Filled 2016-11-21 (×3): qty 2

## 2016-11-21 MED ORDER — ACETAMINOPHEN 325 MG PO TABS: 650.0000 mg | ORAL_TABLET | Freq: Four times a day (QID) | ORAL | Status: DC | PRN

## 2016-11-21 MED ORDER — MEROPENEM-SODIUM CHLORIDE 1 GM/50ML IV SOLR
1.0000 g | Freq: Three times a day (TID) | INTRAVENOUS | Status: DC
  Administered 2016-11-22 (×2): 1 g via INTRAVENOUS
  Filled 2016-11-21 (×4): qty 50

## 2016-11-21 MED ORDER — METHYLPREDNISOLONE SODIUM SUCC 125 MG IJ SOLR
125.0000 mg | Freq: Once | INTRAMUSCULAR | Status: AC
  Administered 2016-11-21: 125 mg via INTRAVENOUS
  Filled 2016-11-21: qty 2

## 2016-11-21 MED ORDER — KETOROLAC TROMETHAMINE 30 MG/ML IJ SOLN: 30.0000 mg | Freq: Four times a day (QID) | INTRAMUSCULAR | Status: DC | PRN

## 2016-11-21 MED ORDER — FUROSEMIDE 40 MG PO TABS
40.0000 mg | ORAL_TABLET | Freq: Two times a day (BID) | ORAL | Status: DC
  Administered 2016-11-21 – 2016-11-22 (×2): 40 mg via ORAL
  Filled 2016-11-21 (×2): qty 1

## 2016-11-21 MED ORDER — GABAPENTIN 600 MG PO TABS
1200.0000 mg | ORAL_TABLET | Freq: Four times a day (QID) | ORAL | Status: DC
  Administered 2016-11-21 – 2016-11-22 (×3): 1200 mg via ORAL
  Filled 2016-11-21 (×4): qty 2

## 2016-11-21 MED ORDER — ONDANSETRON HCL 4 MG/2ML IJ SOLN: 4.0000 mg | Freq: Four times a day (QID) | INTRAMUSCULAR | Status: DC | PRN

## 2016-11-21 MED ORDER — ACETAMINOPHEN 500 MG PO TABS
1000.0000 mg | ORAL_TABLET | Freq: Once | ORAL | Status: AC
  Administered 2016-11-21: 1000 mg via ORAL
  Filled 2016-11-21: qty 2

## 2016-11-21 MED ORDER — ASPIRIN EC 81 MG PO TBEC
81.0000 mg | DELAYED_RELEASE_TABLET | Freq: Every day | ORAL | Status: DC
  Administered 2016-11-22: 08:00:00 81 mg via ORAL
  Filled 2016-11-21: qty 1

## 2016-11-21 MED ORDER — METFORMIN HCL ER 500 MG PO TB24
500.0000 mg | ORAL_TABLET | Freq: Two times a day (BID) | ORAL | Status: DC
  Administered 2016-11-22: 500 mg via ORAL
  Filled 2016-11-21: qty 1

## 2016-11-21 MED ORDER — SODIUM CHLORIDE 0.9 % IV SOLN
1.0000 g | Freq: Three times a day (TID) | INTRAVENOUS | Status: DC
  Filled 2016-11-21 (×2): qty 1

## 2016-11-21 MED ORDER — MEROPENEM-SODIUM CHLORIDE 500 MG/50ML IV SOLR
INTRAVENOUS | Status: AC
  Filled 2016-11-21: qty 50

## 2016-11-21 MED ORDER — MEROPENEM-SODIUM CHLORIDE 500 MG/50ML IV SOLR
500.0000 mg | Freq: Three times a day (TID) | INTRAVENOUS | Status: DC
  Filled 2016-11-21 (×2): qty 50

## 2016-11-21 MED ORDER — RIVAROXABAN 15 MG PO TABS
15.0000 mg | ORAL_TABLET | Freq: Two times a day (BID) | ORAL | Status: DC
  Administered 2016-11-21 – 2016-11-22 (×2): 15 mg via ORAL
  Filled 2016-11-21 (×3): qty 1

## 2016-11-21 NOTE — ED Notes (Signed)
Patient transported to CT 

## 2016-11-21 NOTE — ED Provider Notes (Addendum)
[facility] Emergency Department Provider Note        Time seen: ----------------------------------------- 2:34 PM on 11/21/2016 -----------------------------------------    I have reviewed the triage vital signs and the nursing notes.   HISTORY  Chief Complaint Allergic Reaction    HPI Willie Krueger is a 60 y.o. male who presents to the ER for diffuse rash. Patient reports generalized rash to his entire body, worse around his left arm. Patient states the rash began several days ago. Currently is on PICC line treatment of Zosyn forosteomyelitis. Patient denies fevers but has had some chills, states the generalized rash started a few days ago. He has also had significant shortness of breath with exertion. He was told he needed a steroid shot to improve his symptoms. Patient states he took 2 Benadryl last night without significant improvement, still has diffuse itching. He denies any chest pain. He continues to have left upper extremity swelling   Past Medical History:  Diagnosis Date  . Chronic pain   . Chronic pain associated with significant psychosocial dysfunction 05/04/2015   Last Assessment & Plan:  He is having worsening problems with breakthrough pain. Gabapentin is increased from 800 mg 3 times daily to 1200 mg 3 times daily. Oxycodone is also renewed. He continues to through evaluation at [facility] pain clinic.   . Diabetes mellitus without complication ([place])   . Heart murmur   . History of spinal stenosis 10/31/2015   C3-4 with severe central canal stenosis with small amount of myelomalacia present. Moderate to severe canal stenosis is also present at C4-5 and C5-6. Moderate to severe neuroforaminal narrowing between the levels of C2-C7  . Hyperlipidemia   . Hypertension   . Leg weakness 07/07/2014  . Mitral valve regurgitation   . Nerve root inflammation 06/26/2014   Last Assessment & Plan:  Status is unchanged with ongoing pain and limited  mobility. Continue with physical therapy and pain management.   . Osteoarthritis     Patient Active Problem List   Diagnosis Date Noted  . Diabetic infection of left foot ([place]) 10/13/2016  . Lumbar spondylosis 06/03/2016  . Lumbar facet syndrome (Location of Primary Source of Pain) (Bilateral) (R>L) 05/28/2016  . Abnormal MRI, lumbar spine (05/19/2016) 05/28/2016  . Neurogenic pain 02/27/2016  . Diabetic peripheral neuropathy ([place]) 02/27/2016  . Chronic shoulder pain (Location of Tertiary source of pain) (Bilateral) (R>L) 02/27/2016  . Type 2 diabetes mellitus with foot ulcer (CODE) ([place]) 01/23/2016  . Diabetic osteomyelitis ([place]) 12/05/2015  . Neuropathy ([place]) 12/05/2015  . Obesity 12/05/2015  . Encounter for chronic pain management 12/03/2015  . Elevated C-reactive protein (CRP) 12/03/2015  . Chronic pain 10/31/2015  . Long term current use of opiate analgesic 10/31/2015  . Long term prescription opiate use 10/31/2015  . Opiate use (112.5 MME/Day) 10/31/2015  . Opiate dependence ([place]) 10/31/2015  . Encounter for therapeutic drug level monitoring 10/31/2015  . Chronic neck pain (Right side) 10/31/2015  . Cervical spondylosis 10/31/2015  . Cervical post-laminectomy syndrome 10/31/2015  . Chronic low back pain (Location of Primary Source of Pain) (Midline pain) (R>L) 10/31/2015  . Chronic cervical radicular pain (Bilateral) (R>L) 10/31/2015  . Chronic lumbar radicular pain (Bilateral) (R>L) 10/31/2015  . Substance use disorder Risk: LOW 10/31/2015  . 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 Chronic lower extremity pain (Location of Secondary source of pain) (Bilateral) (R>L) 10/31/2015  . History of carpal tunnel release of both wrists 10/31/2015  . Grade I anterolisthesis of  C7 on T1 10/31/2015  . Toe ulcer ([place]) 06/24/2015  . Cervical spinal cord compression ([place]) 02/07/2015  . MI (mitral incompetence) 11/11/2014  . Cardiac murmur 11/09/2014  . History of decompression of  median nerve 05/19/2014  . HLD (hyperlipidemia) 10/20/2012  . Benign hypertension 10/20/2012  . 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 Chronic hepatitis C virus infection ([place]) 10/02/2011  . Adiposity 10/02/2011  . Current smoker 10/02/2011    Past Surgical History:  Procedure Laterality Date  . AMPUTATION TOE Left 10/2016   partial  . CARPAL TUNNEL RELEASE    . IRRIGATION AND DEBRIDEMENT FOOT Left 10/13/2016   Procedure: IRRIGATION AND DEBRIDEMENT FOOT;  Surgeon: Samara Deist, DPM;  Location: [facility] ORS;  Service: Podiatry;  Laterality: Left;  . PICC LINE PLACE PERIPHERAL ([place] HX) Left [id number]  . SPINE SURGERY      Allergies Cleocin [clindamycin]; Clindamycin phosphate; Other; and Sulfa antibiotics  Social History Social History  Substance Use Topics  . Smoking status: Current Every Day Smoker    Packs/day: 1.50    Types: Cigarettes  . Smokeless tobacco: Never Used  . Alcohol use No    Review of Systems Constitutional: Negative for fever. Cardiovascular: Negative for chest pain. Respiratory: Positive shortness of breath Gastrointestinal: Negative for abdominal pain, vomiting and diarrhea. Genitourinary: Negative for dysuria. Musculoskeletal: Positive for left upper extremity swelling Skin: Positive for diffuse rash, worse on the left arm Neurological: Negative for headaches, focal weakness or numbness.  10-point ROS otherwise negative.  ____________________________________________   PHYSICAL EXAM:  VITAL SIGNS: ED Triage Vitals [11/21/16 1304]  Enc Vitals Group     BP (!) 147/80     Pulse Rate (!) 104     Resp 18     Temp 98.7 F (37.1 C)     Temp Source Oral     SpO2 98 %     Weight 229 lb (103.9 kg)     Height 6\' 2"  (1.88 m)     Head Circumference      Peak Flow      Pain Score 10     Pain Loc      Pain Edu?      Excl. in [place]?     Constitutional: Alert and oriented. Mild distress Eyes: Conjunctivae are normal. PERRL. Normal  extraocular movements. ENT   Head: Normocephalic and atraumatic.   Nose: No congestion/rhinnorhea.   Mouth/Throat: Mucous membranes are moist.   Neck: No stridor. Cardiovascular: Rapid rate, regular rhythm. No murmurs, rubs, or gallops. Respiratory: Mild tachypnea with clear breath sounds Gastrointestinal: Soft and nontender. Normal bowel sounds Musculoskeletal: Nontender with normal range of motion in all extremities. Marked left upper extremity edema Neurologic:  Normal speech and language. No gross focal neurologic deficits are appreciated.  Skin:  Diffuse maculopapular rash with confluence in the left arm, weeping lesions scattered as well. No mucosal involvement, left foot wound over the great toe and first metatarsal phalangeal joint without active drainage Psychiatric: Mood and affect are normal. Speech and behavior are normal.  ____________________________________________  EKG: Interpreted by me. Sinus tachycardia with a rate of 134 bpm, normal PR interval, normal QRS, normal QT, normal axis.  ____________________________________________  ED COURSE:  Pertinent labs & imaging results that were available during my care of the patient were reviewed by me and considered in my medical decision making (see chart for details). Patient presents to the ER with known DVT and likely drug rash secondary to Zosyn. He is also  short of breath, we will need to assess for PE. He'll receive IV Solu-Medrol and Benadryl. Patient will likely need admission into the hospital  Procedures ____________________________________________   LABS (pertinent positives/negatives)  Labs Reviewed  CBC WITH DIFFERENTIAL/PLATELET - Abnormal; Notable for the following:       Result Value   RDW 14.6 (*)    Monocytes Absolute 1.3 (*)    Eosinophils Absolute 1.0 (*)    All other components within normal limits  COMPREHENSIVE METABOLIC PANEL - Abnormal; Notable for the following:    Sodium 133 (*)     Chloride 98 (*)    Glucose, Bld 217 (*)    Total Protein 8.4 (*)    ALT 11 (*)    All other components within normal limits  LACTIC ACID, PLASMA - Abnormal; Notable for the following:    Lactic Acid, Venous 2.0 (*)    All other components within normal limits  CULTURE, BLOOD (ROUTINE X 2)  CULTURE, BLOOD (ROUTINE X 2)  BRAIN NATRIURETIC PEPTIDE  TROPONIN I  SEDIMENTATION RATE  LACTIC ACID, PLASMA    RADIOLOGY Images were viewed by me  CT imaging of the chest IMPRESSION: 1. Patient breathing motion artifact limits characterization of the peripheral pulmonary artery branches. There is no central obstructing pulmonary embolism identified. Due to the breathing motion artifact, I cannot exclude small peripheral pulmonary embolism. 2. Lungs are clear.  No evidence of pneumonia or pulmonary edema. 3. No aortic aneurysm or dissection. 4. Heart size is normal.  No pericardial effusion. 5. Aortic atherosclerosis. 6. **An incidental finding of potential clinical significance has been found. Heterogeneous enlargement of the left thyroid lobe. Recommend nonemergent thyroid ultrasound evaluation.**   ____________________________________________  FINAL ASSESSMENT AND PLAN  Drug rash, left upper extremity edema, dyspnea, tachycardia  Plan: Patient with labs and imaging as dictated above. Patient presented to the ER mostly with complaints of rash which is likely a drug rash. I will hold his Zosyn and place him on meropenem. He has signs of systemic inflammatory response despite outpatient treatment via PICC line. He is also received IV Benadryl and steroids here. I will discuss with the hospitalist for observation.   Earleen Newport, MD   Note: This dictation was prepared with Dragon dictation. Any transcriptional errors that result from this process are unintentional    Earleen Newport, MD 11/21/16 1801    Earleen Newport, MD 11/21/16 [phone]

## 2016-11-21 NOTE — H&P (Signed)
[place] [facility] [place] NAME: Willie Krueger    MR#:  [id number]  DATE OF BIRTH:  [birth year]  DATE OF ADMISSION:  11/21/2016  PRIMARY CARE PHYSICIAN: [facility]   REQUESTING/REFERRING PHYSICIAN: Earleen Newport, MD  CHIEF COMPLAINT:   Chief Complaint  Patient presents with  . Allergic Reaction   Rash and itchy for several days. HISTORY OF PRESENT ILLNESS:  Willie Krueger  is a 60 y.o. 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 The patient has been on Zosyn for the treatment of left foot osteomyelitis for 5 weeks. He said Zosyn was changed to the one produced by the other company 1 weeks ago. He started to have rash all over the body several days ago. In addition, he complains of chills and itch. He was found fever 103 in the ED. He recently has left upper extremity swelling with diagnosis of thrombosis and is taking Xarelto. PAST MEDICAL HISTORY:   Past Medical History:  Diagnosis Date  . Chronic pain   . Chronic pain associated with significant psychosocial dysfunction 05/04/2015   Last Assessment & Plan:  He is having worsening problems with breakthrough pain. Gabapentin is increased from 800 mg 3 times daily to 1200 mg 3 times daily. Oxycodone is also renewed. He continues to through evaluation at [facility] pain clinic.   . Diabetes mellitus without complication ([place])   . Heart murmur   . History of spinal stenosis 10/31/2015   C3-4 with severe central canal stenosis with small amount of myelomalacia present. Moderate to severe canal stenosis is also present at C4-5 and C5-6. Moderate to severe neuroforaminal narrowing between the levels of C2-C7  . Hyperlipidemia   . Hypertension   . Leg weakness 07/07/2014  . Mitral valve regurgitation   . Nerve root inflammation 06/26/2014   Last Assessment & Plan:  Status is unchanged with ongoing pain and limited mobility.  Continue with physical therapy and pain management.   . Osteoarthritis     PAST SURGICAL HISTORY:   Past Surgical History:  Procedure Laterality Date  . AMPUTATION TOE Left 10/2016   partial  . CARPAL TUNNEL RELEASE    . IRRIGATION AND DEBRIDEMENT FOOT Left 10/13/2016   Procedure: IRRIGATION AND DEBRIDEMENT FOOT;  Surgeon: Samara Deist, DPM;  Location: [facility] ORS;  Service: Podiatry;  Laterality: Left;  . PICC LINE PLACE PERIPHERAL ([place] HX) Left [id number]  . SPINE SURGERY      SOCIAL HISTORY:   Social History  Substance Use Topics  . Smoking status: Current Every Day Smoker    Packs/day: 1.50    Types: Cigarettes  . Smokeless tobacco: Never Used  . Alcohol use No    FAMILY HISTORY:   Family History  Problem Relation Age of Onset  . Cancer Mother   . Diabetes Mother   . Heart disease Father     DRUG ALLERGIES:   Allergies  Allergen Reactions  . Cleocin [Clindamycin] Rash  . Clindamycin Phosphate Rash  . Other Rash  . Sulfa Antibiotics Rash    REVIEW OF SYSTEMS:   Review of Systems  Constitutional: Positive for chills, fever and malaise/fatigue.  HENT: Negative for congestion.   Eyes: Negative for blurred vision and double vision.  Respiratory: Negative for cough, sputum production, shortness of breath and stridor.   Cardiovascular: Negative for chest pain and leg swelling.  Gastrointestinal: Negative for abdominal pain, blood in stool,  diarrhea, melena, nausea and vomiting.  Genitourinary: Negative for dysuria and frequency.  Musculoskeletal: Negative for joint pain.  Skin: Positive for itching and rash.  Neurological: Negative for dizziness, focal weakness and loss of consciousness.  Psychiatric/Behavioral: Negative for depression. The patient is not nervous/anxious.     MEDICATIONS AT HOME:   Prior to Admission medications   Medication Sig Start Date End Date Taking? Authorizing Provider  acetaminophen (TYLENOL ARTHRITIS PAIN) 650 MG CR tablet Take  1,300 mg by mouth every 4 (four) hours.   Yes Historical Provider, MD  aspirin EC 81 MG tablet Take 81 mg by mouth daily.   Yes Historical Provider, MD  atorvastatin (LIPITOR) 40 MG tablet Take 1 tablet (40 mg total) by mouth daily at 6 PM. 10/16/16  Yes Nicholes Mango, MD  Cholecalciferol (VITAMIN D3) 2000 units capsule Take 2,000 Units by mouth daily.  01/23/16  Yes Historical Provider, MD  diphenhydrAMINE (BENADRYL) 25 MG tablet Take 25 mg by mouth every 6 (six) hours as needed.   Yes Historical Provider, MD  furosemide (LASIX) 20 MG tablet Take 40 mg by mouth 2 (two) times daily.    Yes Historical Provider, MD  gabapentin (NEURONTIN) 600 MG tablet Take 2 tablets (1,200 mg total) by mouth every 6 (six) hours. 11/13/16 12/13/16 Yes Milinda Pointer, MD  glimepiride (AMARYL) 2 MG tablet Take 2 mg by mouth daily with breakfast.    Yes Historical Provider, MD  lisinopril (PRINIVIL,ZESTRIL) 40 MG tablet Take 40 mg by mouth daily. 03/08/16  Yes Historical Provider, MD  metFORMIN (GLUCOPHAGE-XR) 500 MG 24 hr tablet Take 500 mg by mouth 2 (two) times daily. 12/03/15  Yes Historical Provider, MD  oxyCODONE (ROXICODONE) 15 MG immediate release tablet Take 1 tablet (15 mg total) by mouth 5 (five) times daily as needed for pain. 11/15/16 12/15/16 Yes Milinda Pointer, MD  piperacillin-tazobactam (ZOSYN) IVPB Inject 3.375 g into the vein every 8 (eight) hours.   Yes Historical Provider, MD  XARELTO STARTER PACK 15 & 20 MG TBPK 1 tablet 2 (two) times daily.  11/06/16  Yes Historical Provider, MD  docusate sodium (COLACE) 100 MG capsule Take 1 capsule (100 mg total) by mouth 2 (two) times daily. 10/16/16   Nicholes Mango, MD  sildenafil (VIAGRA) 100 MG tablet Take 100 mg by mouth daily as needed for erectile dysfunction.    Historical Provider, MD      VITAL SIGNS:  Blood pressure 137/77, pulse 89, temperature 98.9 F (37.2 C), temperature source Oral, resp. rate 20, height 6\' 2"  (1.88 m), weight 229 lb (103.9 kg),  SpO2 93 %.  PHYSICAL EXAMINATION:  Physical Exam  GENERAL:  60 y.o.-year-old patient lying in the bed with no acute distress.  EYES: Pupils equal, round, reactive to light and accommodation. No scleral icterus. Extraocular muscles intact.  HEENT: Head atraumatic, normocephalic. Oropharynx and nasopharynx clear.  NECK:  Supple, no jugular venous distention. No thyroid enlargement, no tenderness.  LUNGS: Normal breath sounds bilaterally, no wheezing, rales,rhonchi or crepitation. No use of accessory muscles of respiration.  CARDIOVASCULAR: S1, S2 normal. No murmurs, rubs, or gallops.  ABDOMEN: Soft, nontender, nondistended. Bowel sounds present. No organomegaly or mass.  EXTREMITIES: No pedal edema, cyanosis, or clubbing. 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  NEUROLOGIC: Cranial nerves II through XII are intact. Muscle strength 5/5 in all extremities. Sensation intact. Gait not checked.  PSYCHIATRIC: The patient is alert and oriented x 3.  SKIN:  Diffuse maculopapular rash.  LABORATORY PANEL:   CBC  Recent Labs Lab 11/21/16 1435  WBC 9.5  HGB 15.3  HCT 45.0  PLT 199   ------------------------------------------------------------------------------------------------------------------  Chemistries   Recent Labs Lab 11/21/16 1435  NA 133*  K 4.2  CL 98*  CO2 24  GLUCOSE 217*  BUN 17  CREATININE 1.11  CALCIUM 9.3  AST 22  ALT 11*  ALKPHOS 87  BILITOT 0.6   ------------------------------------------------------------------------------------------------------------------  Cardiac Enzymes  Recent Labs Lab 11/21/16 1435  TROPONINI <0.03   ------------------------------------------------------------------------------------------------------------------  RADIOLOGY:  Ct Angio Chest Pe W And/or Wo Contrast  Result Date: 11/21/2016 CLINICAL DATA:  Pt very poor historian. Per note in pt chart "Pt reports left arm swelling since yesterday,  reports he has a known DVT in left arm and has a PICC line in left arm. Pt reports generalized rash to entire body". Pt also with increased shortness of breath per MD EXAM: CT ANGIOGRAPHY CHEST WITH CONTRAST TECHNIQUE: Multidetector CT imaging of the chest was performed using the standard protocol during bolus administration of intravenous contrast. Multiplanar CT image reconstructions and MIPs were obtained to evaluate the vascular anatomy. CONTRAST:  75 cc Isovue 370 COMPARISON:  None. FINDINGS: Cardiovascular: Evaluation of the peripheral segmental and subsegmental pulmonary arteries is limited by fairly prominent patient breathing motion artifact but there is no convincing pulmonary embolism identified within the main, lobar or central segmental pulmonary arteries bilaterally. Thoracic aorta is normal in caliber. No aortic aneurysm or dissection. Scattered atherosclerotic changes noted within the aortic arch. Heart size is upper normal. No pericardial effusion. Scattered coronary artery calcifications noted. Mediastinum/Nodes: Scattered small lymph nodes within the mediastinum. No enlarged lymph nodes seen within the mediastinum or perihilar regions. Mildly prominent lymph nodes noted within the axillary regions bilaterally, left slightly greater than right. Esophagus appears normal. Heterogeneous enlargement of the left thyroid lobe. Lungs/Pleura: Lungs are clear. No evidence of pneumonia. No pleural effusion or pneumothorax. Upper Abdomen: Limited images of the upper abdomen are unremarkable. Musculoskeletal: Mild degenerative spurring within the thoracic spine. No acute or suspicious osseous finding. Left-sided PICC line partially imaged. Review of the MIP images confirms the above findings. IMPRESSION: 1. Patient breathing motion artifact limits characterization of the peripheral pulmonary artery branches. There is no central obstructing pulmonary embolism identified. Due to the breathing motion artifact, I  cannot exclude small peripheral pulmonary embolism. 2. Lungs are clear.  No evidence of pneumonia or pulmonary edema. 3. No aortic aneurysm or dissection. 4. Heart size is normal.  No pericardial effusion. 5. Aortic atherosclerosis. 6. **An incidental finding of potential clinical significance has been found. Heterogeneous enlargement of the left thyroid lobe. Recommend nonemergent thyroid ultrasound evaluation.** Electronically Signed   By: Franki Cabot M.D.   On: 11/21/2016 15:59      IMPRESSION AND PLAN:   Rash, possible allergy to Zosyn. The patient will be placed for observation. Start IV Solu-Medrol and Benadryl when necessary.  Left foot osteomyelitis Change to meropenem and I consult.  Diabetes. Start a sliding scale and continue metformin.  Hypertension. Continue hypertension medication.  Tobacco abuse. Smoking cessation was counseled for 3 minutes, given nicotine patch.  All the records are reviewed and case discussed with ED provider. Management plans discussed with the patient, family and they are in agreement.  CODE STATUS: Full code  TOTAL TIME TAKING CARE OF THIS PATIENT: 52 minutes.    Demetrios Loll M.D on 11/21/2016 at 8:30 PM  Between 7am to 6pm - Pager - [phone]  After 6pm  go to [email or web address] - Proofreader  [facility]  Office  [phone]  CC: Primary care physician; [facility]   Note: This dictation was prepared with Dragon dictation along with smaller phrase technology. Any transcriptional errors that result from this process are unintentional.

## 2016-11-21 NOTE — ED Notes (Signed)
ED Provider at bedside. 

## 2016-11-21 NOTE — Progress Notes (Signed)
ANTIBIOTIC CONSULT NOTE - INITIAL  Pharmacy Consult for Meropenem Indication: osteomyelitis  Allergies  Allergen Reactions  . Cleocin [Clindamycin] Rash  . Clindamycin Phosphate Rash  . Other Rash  . Sulfa Antibiotics Rash    Patient Measurements: Height: 6\' 2"  (188 cm) Weight: 229 lb (103.9 kg) IBW/kg (Calculated) : 82.2 Adjusted Body Weight:   Vital Signs: Temp: 98.9 F (37.2 C) (11/24 1949) Temp Source: Oral (11/24 1949) BP: 137/77 (11/24 1949) Pulse Rate: 89 (11/24 1949) Intake/Output from previous day: No intake/output data recorded. Intake/Output from this shift: No intake/output data recorded.  Labs:  Recent Labs  11/21/16 1435  WBC 9.5  HGB 15.3  PLT 199  CREATININE 1.11   Estimated Creatinine Clearance: 88.7 mL/min (by C-G formula based on SCr of 1.11 mg/dL). No results for input(s): VANCOTROUGH, VANCOPEAK, VANCORANDOM, GENTTROUGH, GENTPEAK, GENTRANDOM, TOBRATROUGH, TOBRAPEAK, TOBRARND, AMIKACINPEAK, AMIKACINTROU, AMIKACIN in the last 72 hours.   Microbiology: No results found for this or any previous visit (from the past 720 hour(s)).  Medical History: Past Medical History:  Diagnosis Date  . Chronic pain   . Chronic pain associated with significant psychosocial dysfunction 05/04/2015   Last Assessment & Plan:  He is having worsening problems with breakthrough pain. Gabapentin is increased from 800 mg 3 times daily to 1200 mg 3 times daily. Oxycodone is also renewed. He continues to through evaluation at [facility] pain clinic.   . Diabetes mellitus without complication ([place])   . Heart murmur   . History of spinal stenosis 10/31/2015   C3-4 with severe central canal stenosis with small amount of myelomalacia present. Moderate to severe canal stenosis is also present at C4-5 and C5-6. Moderate to severe neuroforaminal narrowing between the levels of C2-C7  . Hyperlipidemia   . Hypertension   . Leg weakness 07/07/2014  . Mitral valve regurgitation   .  Nerve root inflammation 06/26/2014   Last Assessment & Plan:  Status is unchanged with ongoing pain and limited mobility. Continue with physical therapy and pain management.   . Osteoarthritis     Medications:  Prescriptions Prior to Admission  Medication Sig Dispense Refill Last Dose  . acetaminophen (TYLENOL ARTHRITIS PAIN) 650 MG CR tablet Take 1,300 mg by mouth every 4 (four) hours.   11/21/2016 at 1100  . aspirin EC 81 MG tablet Take 81 mg by mouth daily.   11/20/2016 at Unknown time  . atorvastatin (LIPITOR) 40 MG tablet Take 1 tablet (40 mg total) by mouth daily at 6 PM. 30 tablet 0 11/20/2016 at Unknown time  . Cholecalciferol (VITAMIN D3) 2000 units capsule Take 2,000 Units by mouth daily.    11/20/2016 at Unknown time  . diphenhydrAMINE (BENADRYL) 25 MG tablet Take 25 mg by mouth every 6 (six) hours as needed.   prn at prn  . furosemide (LASIX) 20 MG tablet Take 40 mg by mouth 2 (two) times daily.    11/20/2016 at Unknown time  . gabapentin (NEURONTIN) 600 MG tablet Take 2 tablets (1,200 mg total) by mouth every 6 (six) hours. 240 tablet 0 11/21/2016 at 1100  . glimepiride (AMARYL) 2 MG tablet Take 2 mg by mouth daily with breakfast.    11/20/2016 at Unknown time  . lisinopril (PRINIVIL,ZESTRIL) 40 MG tablet Take 40 mg by mouth daily.   11/20/2016 at Unknown time  . metFORMIN (GLUCOPHAGE-XR) 500 MG 24 hr tablet Take 500 mg by mouth 2 (two) times daily.   11/20/2016 at Unknown time  . oxyCODONE (ROXICODONE) 15 MG immediate  release tablet Take 1 tablet (15 mg total) by mouth 5 (five) times daily as needed for pain. 150 tablet 0 11/21/2016 at 1100  . piperacillin-tazobactam (ZOSYN) IVPB Inject 3.375 g into the vein every 8 (eight) hours.     [employer] STARTER PACK 15 & 20 MG TBPK 1 tablet 2 (two) times daily.    11/20/2016 at Unknown time  . docusate sodium (COLACE) 100 MG capsule Take 1 capsule (100 mg total) by mouth 2 (two) times daily. 10 capsule 0 prn at prn  . sildenafil (VIAGRA)  100 MG tablet Take 100 mg by mouth daily as needed for erectile dysfunction.   prn at prn   Assessment: CrCl = 88.7 ml/min   Goal of Therapy:  resolution of infection  Plan:  Expected duration 7 days with resolution of temperature and/or normalization of WBC   Meropenem 1 gm IV Q8H to start on 11/25 @ 0200.   Eliza Grissinger D 11/21/2016,8:24 PM

## 2016-11-21 NOTE — ED Triage Notes (Signed)
On a new blood thinner for DVT in the left arm where PICC line is , red raised rash throughout trunk,and extremities x1 day took 2 tabs of benadryl last night

## 2016-11-21 NOTE — ED Notes (Signed)
ED Provider at bedside. Admitting  MD , Dr Bridgett Larsson

## 2016-11-21 NOTE — ED Triage Notes (Signed)
Pt reports left arm swelling since yesterday, reports he has a known DVT in left arm and has a PICC line in left arm. Pt reports generalized rash to entire body.

## 2016-11-22 LAB — BASIC METABOLIC PANEL
ANION GAP: 9 (ref 5–15)
BUN: 21 mg/dL — ABNORMAL HIGH (ref 6–20)
CO2: 21 mmol/L — ABNORMAL LOW (ref 22–32)
Calcium: 8.8 mg/dL — ABNORMAL LOW (ref 8.9–10.3)
Chloride: 103 mmol/L (ref 101–111)
Creatinine, Ser: 1.13 mg/dL (ref 0.61–1.24)
GFR calc Af Amer: >60 mL/min (ref >60)
Glucose, Bld: 413 mg/dL — ABNORMAL HIGH (ref 65–99)
POTASSIUM: 4.4 mmol/L (ref 3.5–5.1)
SODIUM: 133 mmol/L — AB (ref 135–145)

## 2016-11-22 LAB — CBC
HEMATOCRIT: 41.7 % (ref 40.0–52.0)
HEMOGLOBIN: 14.3 g/dL (ref 13.0–18.0)
MCH: 32.7 pg (ref 26.0–34.0)
MCHC: 34.4 g/dL (ref 32.0–36.0)
MCV: 95.1 fL (ref 80.0–100.0)
Platelets: 193 10*3/uL (ref 150–440)
RBC: 4.39 MIL/uL — AB (ref 4.40–5.90)
RDW: 14.8 % — AB (ref 11.5–14.5)
WBC: 10.2 10*3/uL (ref 3.8–10.6)

## 2016-11-22 LAB — GLUCOSE, CAPILLARY
GLUCOSE-CAPILLARY: 269 mg/dL — AB (ref 65–99)
Glucose-Capillary: 356 mg/dL — ABNORMAL HIGH (ref 65–99)

## 2016-11-22 MED ORDER — INSULIN ASPART 100 UNIT/ML ~~LOC~~ SOLN
0.0000 [IU] | Freq: Three times a day (TID) | SUBCUTANEOUS | Status: DC
  Administered 2016-11-22: 13:00:00 11 [IU] via SUBCUTANEOUS
  Filled 2016-11-22: qty 11

## 2016-11-22 MED ORDER — LEVOFLOXACIN IN D5W 750 MG/150ML IV SOLN
750.0000 mg | INTRAVENOUS | Status: DC
  Administered 2016-11-22: 750 mg via INTRAVENOUS
  Filled 2016-11-22: qty 150

## 2016-11-22 MED ORDER — LEVOFLOXACIN 750 MG PO TABS: 750.0000 mg | ORAL_TABLET | Freq: Every day | ORAL | 0 refills | Status: AC

## 2016-11-22 MED ORDER — LORATADINE 10 MG PO TABS
10.0000 mg | ORAL_TABLET | Freq: Every day | ORAL | Status: DC
  Administered 2016-11-22: 13:00:00 10 mg via ORAL
  Filled 2016-11-22: qty 1

## 2016-11-22 MED ORDER — METHYLPREDNISOLONE SODIUM SUCC 40 MG IJ SOLR: 40.0000 mg | Freq: Two times a day (BID) | INTRAMUSCULAR | Status: DC

## 2016-11-22 MED ORDER — PREDNISONE 10 MG PO TABS: 50.0000 mg | ORAL_TABLET | Freq: Every day | ORAL | 0 refills | Status: AC

## 2016-11-22 MED ORDER — RIVAROXABAN 20 MG PO TABS: 20.0000 mg | ORAL_TABLET | Freq: Every day | ORAL | Status: DC

## 2016-11-22 MED ORDER — METRONIDAZOLE 500 MG PO TABS
500.0000 mg | ORAL_TABLET | Freq: Three times a day (TID) | ORAL | Status: DC
  Administered 2016-11-22: 500 mg via ORAL
  Filled 2016-11-22: qty 1

## 2016-11-22 MED ORDER — METRONIDAZOLE 500 MG PO TABS: 500.0000 mg | ORAL_TABLET | Freq: Three times a day (TID) | ORAL | 0 refills | Status: AC

## 2016-11-22 NOTE — Progress Notes (Signed)
[place] at [place] NAME: Willie Krueger    MR#:  [id number]  DATE OF BIRTH:  06-23-1956  SUBJECTIVE:   Patient reports itching and diffuse rash XARELTO only new medication  which was started approximately 2 weeks ago for an upper extremity DVT from the PICC line  REVIEW OF SYSTEMS:    Review of Systems  Constitutional: Negative.  Negative for chills, fever and malaise/fatigue.  HENT: Negative.  Negative for ear discharge, ear pain, hearing loss, nosebleeds and sore throat.   Eyes: Negative.  Negative for blurred vision and pain.  Respiratory: Negative.  Negative for cough, hemoptysis, shortness of breath and wheezing.   Cardiovascular: Negative.  Negative for chest pain, palpitations and leg swelling.  Gastrointestinal: Negative.  Negative for abdominal pain, blood in stool, diarrhea, nausea and vomiting.  Genitourinary: Negative.  Negative for dysuria.  Musculoskeletal: Negative.  Negative for back pain.  Skin: Negative.        Diffuse morbilliform/blistery rash  Neurological: Negative for dizziness, tremors, speech change, focal weakness, seizures and headaches.  Endo/Heme/Allergies: Negative.  Does not bruise/bleed easily.  Psychiatric/Behavioral: Negative.  Negative for depression, hallucinations and suicidal ideas.    Tolerating Diet: yes      DRUG ALLERGIES:   Allergies  Allergen Reactions  . Cleocin [Clindamycin] Rash  . Clindamycin Phosphate Rash  . Other Rash  . Sulfa Antibiotics Rash  . Zosyn [Piperacillin Sod-Tazobactam So] Rash    VITALS:  Blood pressure 127/79, pulse 94, temperature 98.1 F (36.7 C), temperature source Oral, resp. rate 19, height 6\' 2"  (1.88 m), weight 102.1 kg (225 lb), SpO2 95 %.  PHYSICAL EXAMINATION:   Physical Exam    LABORATORY PANEL:   CBC  Recent Labs Lab 11/22/16 0502  WBC 10.2  HGB 14.3  HCT 41.7  PLT 193    ------------------------------------------------------------------------------------------------------------------  Chemistries   Recent Labs Lab 11/21/16 1435 11/22/16 0502  NA 133* 133*  K 4.2 4.4  CL 98* 103  CO2 24 21*  GLUCOSE 217* 413*  BUN 17 21*  CREATININE 1.11 1.13  CALCIUM 9.3 8.8*  AST 22  --   ALT 11*  --   ALKPHOS 87  --   BILITOT 0.6  --    ------------------------------------------------------------------------------------------------------------------  Cardiac Enzymes  Recent Labs Lab 11/21/16 1435  TROPONINI <0.03   ------------------------------------------------------------------------------------------------------------------  RADIOLOGY:  Ct Angio Chest Pe W And/or Wo Contrast  Result Date: 11/21/2016 CLINICAL DATA:  Pt very poor historian. Per note in pt chart "Pt reports left arm swelling since yesterday, reports he has a known DVT in left arm and has a PICC line in left arm. Pt reports generalized rash to entire body". Pt also with increased shortness of breath per MD EXAM: CT ANGIOGRAPHY CHEST WITH CONTRAST TECHNIQUE: Multidetector CT imaging of the chest was performed using the standard protocol during bolus administration of intravenous contrast. Multiplanar CT image reconstructions and MIPs were obtained to evaluate the vascular anatomy. CONTRAST:  75 cc Isovue 370 COMPARISON:  None. FINDINGS: Cardiovascular: Evaluation of the peripheral segmental and subsegmental pulmonary arteries is limited by fairly prominent patient breathing motion artifact but there is no convincing pulmonary embolism identified within the main, lobar or central segmental pulmonary arteries bilaterally. Thoracic aorta is normal in caliber. No aortic aneurysm or dissection. Scattered atherosclerotic changes noted within the aortic arch. Heart size is upper normal. No pericardial effusion. Scattered coronary artery calcifications noted. Mediastinum/Nodes: Scattered small lymph  nodes within the mediastinum. No  enlarged lymph nodes seen within the mediastinum or perihilar regions. Mildly prominent lymph nodes noted within the axillary regions bilaterally, left slightly greater than right. Esophagus appears normal. Heterogeneous enlargement of the left thyroid lobe. Lungs/Pleura: Lungs are clear. No evidence of pneumonia. No pleural effusion or pneumothorax. Upper Abdomen: Limited images of the upper abdomen are unremarkable. Musculoskeletal: Mild degenerative spurring within the thoracic spine. No acute or suspicious osseous finding. Left-sided PICC line partially imaged. Review of the MIP images confirms the above findings. IMPRESSION: 1. Patient breathing motion artifact limits characterization of the peripheral pulmonary artery branches. There is no central obstructing pulmonary embolism identified. Due to the breathing motion artifact, I cannot exclude small peripheral pulmonary embolism. 2. Lungs are clear.  No evidence of pneumonia or pulmonary edema. 3. No aortic aneurysm or dissection. 4. Heart size is normal.  No pericardial effusion. 5. Aortic atherosclerosis. 6. **An incidental finding of potential clinical significance has been found. Heterogeneous enlargement of the left thyroid lobe. Recommend nonemergent thyroid ultrasound evaluation.** Electronically Signed   By: Franki Cabot M.D.   On: 11/21/2016 15:59     ASSESSMENT AND PLAN:   60 year old male with a history of Peptostreptococcus left foot diabetic osteomyelitis status post I&D an amputation of the distal IP joint of the left greater toe in mid-October who presents with diffuse morbilliform/blistery rash with fevers.  1. Drug rash likely due to Zosyn. This has been entered as allergy. Continue supportive management with IV steroids, Benadryl and H1 blocker.   2. Left foot diabetic osteomyelitis in mid-October with culture positive for Peptostreptococcus: Discussed case with ID on call Discontinue Zosyn due  to high suspicion for drug rash Discontinue PICC Start Flagyl and Levaquin  3. Recent upper extremity DVT on XARELTO: Discussed with ID and did online search not many reprots on drug reaction If rash not improved with stopping Zosyn then will stop XARELTO.  4. Diabetes: Continue sliding scale insulin Stop metformin while patient is in hospital 5. Essential hypertension on lisinopril  Management plans discussed with the patient and he is in agreement.  CODE STATUS: full  TOTAL TIME TAKING CARE OF THIS PATIENT: 33 minutes.    Rounded with nursing  POSSIBLE D/C 2 days, DEPENDING ON CLINICAL CONDITION.   Drena Ham M.D on 11/22/2016 at 9:15 AM  Between 7am to 6pm - Pager - [phone] After 6pm go to [email or web address] - password [email or web address] [facility]  Office  [phone]  CC: Primary care physician; [facility] PRIMARY CARE  Note: This dictation was prepared with Dragon dictation along with smaller phrase technology. Any transcriptional errors that result from this process are unintentional.

## 2016-11-22 NOTE — Discharge Summary (Signed)
[facility] [place] NAME: Willie Krueger    MR#:  [id number]  DATE OF BIRTH:  1956-12-28  DATE OF ADMISSION:  11/21/2016 ADMITTING PHYSICIAN: Demetrios Loll, MD  DATE OF DISCHARGE: *11/22/2016  PRIMARY CARE PHYSICIAN: [facility]    ADMISSION DIAGNOSIS:  Rash [R21] Fever, unspecified fever cause [R50.9] Dyspnea, unspecified type [R06.00]  DISCHARGE DIAGNOSIS:  Active Problems:   Allergy to drug   SECONDARY DIAGNOSIS:   Past Medical History:  Diagnosis Date  . Chronic pain   . Chronic pain associated with significant psychosocial dysfunction 05/04/2015   Last Assessment & Plan:  He is having worsening problems with breakthrough pain. Gabapentin is increased from 800 mg 3 times daily to 1200 mg 3 times daily. Oxycodone is also renewed. He continues to through evaluation at [facility] pain clinic.   . Diabetes mellitus without complication ([place])   . Heart murmur   . History of spinal stenosis 10/31/2015   C3-4 with severe central canal stenosis with small amount of myelomalacia present. Moderate to severe canal stenosis is also present at C4-5 and C5-6. Moderate to severe neuroforaminal narrowing between the levels of C2-C7  . Hyperlipidemia   . Hypertension   . Leg weakness 07/07/2014  . Mitral valve regurgitation   . Nerve root inflammation 06/26/2014   Last Assessment & Plan:  Status is unchanged with ongoing pain and limited mobility. Continue with physical therapy and pain management.   . 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  1. Drug rash likely due to Zosyn or lasix versus autoimmune response. Dermatology consult was requested and their thoughts are this may be related to above medications  or autoimmune. Etiology maybe Linear IgG.  We have discontinued both of these medications. He had a biopsy in the hospital and will need an immunoflorensce biopsy on Monday at the office. He will discharged on Prednisone 50 mg daily untl his clinic visit on Monday. Zosyn has been entered as allergy.    2. Left foot diabetic osteomyelitis in mid-October with culture positive for Peptostreptococcus: Discussed case with ID on call Discontinue Zosyn due to high suspicion for drug rash reaction.  PICC discontinued Recomndations were to start Flagyl and Levaquin. As per Dr Blane Ohara not he was to extend antibiotics for 2 weeks from 11/13, so he is prescribed antibiotics for 5 more days. He will need to follow up with Dr Ola Spurr next week.  3. Recent upper extremity DVT on XARELTO: This medication does not seem to cause such a severe rash as per online search and discussing with Dermatology. He will continue Xarelto.  4. Diabetes: Continue metformin and ada diet 5. Essential hypertension on lisinopril   DISCHARGE CONDITIONS AND DIET:  Stable Diabetic diet  CONSULTS OBTAINED:  Treatment Team:  Leonel Ramsay, MD Kennieth Francois, MD  DRUG ALLERGIES:   Allergies  Allergen Reactions  . Cleocin [Clindamycin] Rash  . Clindamycin Phosphate Rash  . Other Rash  . Sulfa Antibiotics Rash  . Zosyn [Piperacillin Sod-Tazobactam So] Rash    DISCHARGE MEDICATIONS:   Current Discharge Medication List    START taking these medications   Details  levofloxacin (LEVAQUIN) 750 MG tablet Take 1 tablet (750 mg total) by mouth daily. Qty: 5 tablet, Refills:  0    metroNIDAZOLE (FLAGYL) 500 MG tablet Take 1 tablet (500 mg total) by mouth every 8 (eight) hours. Qty: 15 tablet, Refills: 0    predniSONE (DELTASONE) 10 MG tablet Take 5 tablets (50 mg total) by mouth daily with breakfast. Qty: 65 tablet, Refills: 0      CONTINUE these medications which have NOT CHANGED   Details   acetaminophen (TYLENOL ARTHRITIS PAIN) 650 MG CR tablet Take 1,300 mg by mouth every 4 (four) hours.    aspirin EC 81 MG tablet Take 81 mg by mouth daily.    atorvastatin (LIPITOR) 40 MG tablet Take 1 tablet (40 mg total) by mouth daily at 6 PM. Qty: 30 tablet, Refills: 0    Cholecalciferol (VITAMIN D3) 2000 units capsule Take 2,000 Units by mouth daily.     diphenhydrAMINE (BENADRYL) 25 MG tablet Take 25 mg by mouth every 6 (six) hours as needed.    gabapentin (NEURONTIN) 600 MG tablet Take 2 tablets (1,200 mg total) by mouth every 6 (six) hours. Qty: 240 tablet, Refills: 0   Associated Diagnoses: Neurogenic pain    glimepiride (AMARYL) 2 MG tablet Take 2 mg by mouth daily with breakfast.     lisinopril (PRINIVIL,ZESTRIL) 40 MG tablet Take 40 mg by mouth daily.    metFORMIN (GLUCOPHAGE-XR) 500 MG 24 hr tablet Take 500 mg by mouth 2 (two) times daily.    oxyCODONE (ROXICODONE) 15 MG immediate release tablet Take 1 tablet (15 mg total) by mouth 5 (five) times daily as needed for pain. Qty: 150 tablet, Refills: 0   Associated Diagnoses: Chronic pain syndrome    XARELTO STARTER PACK 15 & 20 MG TBPK 1 tablet 2 (two) times daily.     docusate sodium (COLACE) 100 MG capsule Take 1 capsule (100 mg total) by mouth 2 (two) times daily. Qty: 10 capsule, Refills: 0    sildenafil (VIAGRA) 100 MG tablet Take 100 mg by mouth daily as needed for erectile dysfunction.      STOP taking these medications     furosemide (LASIX) 20 MG tablet      piperacillin-tazobactam (ZOSYN) IVPB               Today   CHIEF COMPLAINT:  Doing ok rash itches no mucosal imveolvement able to swallow no SOB   VITAL SIGNS:  Blood pressure 127/70, pulse 84, temperature 98.7 F (37.1 C), temperature source Oral, resp. rate 19, height 6\' 2"  (1.88 m), weight 102.1 kg (225 lb), SpO2 100 %.   REVIEW OF SYSTEMS:  Review of Systems  Constitutional: Positive for fever. Negative for chills and  malaise/fatigue.  HENT: Negative.  Negative for ear discharge, ear pain, hearing loss, nosebleeds and sore throat.   Eyes: Negative.  Negative for blurred vision and pain.  Respiratory: Negative.  Negative for cough, hemoptysis, shortness of breath and wheezing.   Cardiovascular: Negative.  Negative for chest pain, palpitations and leg swelling.  Gastrointestinal: Negative.  Negative for abdominal pain, blood in stool, diarrhea, nausea and vomiting.  Genitourinary: Negative.  Negative for dysuria.  Musculoskeletal: Negative.  Negative for back pain.  Skin: Negative.        Blistery rash throughout body not on mucosa  Neurological: Negative for dizziness, tremors, speech change, focal weakness, seizures and headaches.  Endo/Heme/Allergies: Negative.  Does not bruise/bleed easily.  Psychiatric/Behavioral: Negative.  Negative for depression, hallucinations and suicidal ideas.     PHYSICAL EXAMINATION:  GENERAL:  60 y.o.-year-old patient lying in the bed  with no acute distress.  NECK:  Supple, no jugular venous distention. No thyroid enlargement, no tenderness.  LUNGS: Normal breath sounds bilaterally, no wheezing, rales,rhonchi  No use of accessory muscles of respiration.  CARDIOVASCULAR: S1, S2 normal. No murmurs, rubs, or gallops.  ABDOMEN: Soft, non-tender, non-distended. Bowel sounds present. No organomegaly or mass.  EXTREMITIES: No pedal edema, cyanosis, or clubbing.  PSYCHIATRIC: The patient is alert and oriented x 3.  SKIN: blistery rash throughout body mucosal surfaces not afected  DATA REVIEW:   CBC  Recent Labs Lab 11/22/16 0502  WBC 10.2  HGB 14.3  HCT 41.7  PLT 193    Chemistries   Recent Labs Lab 11/21/16 1435 11/22/16 0502  NA 133* 133*  K 4.2 4.4  CL 98* 103  CO2 24 21*  GLUCOSE 217* 413*  BUN 17 21*  CREATININE 1.11 1.13  CALCIUM 9.3 8.8*  AST 22  --   ALT 11*  --   ALKPHOS 87  --   BILITOT 0.6  --     Cardiac Enzymes  Recent Labs Lab  11/21/16 1435  TROPONINI <0.03    Microbiology Results  @MICRORSLT48 @  RADIOLOGY:  Ct Angio Chest Pe W And/or Wo Contrast  Result Date: 11/21/2016 CLINICAL DATA:  Pt very poor historian. Per note in pt chart "Pt reports left arm swelling since yesterday, reports he has a known DVT in left arm and has a PICC line in left arm. Pt reports generalized rash to entire body". Pt also with increased shortness of breath per MD EXAM: CT ANGIOGRAPHY CHEST WITH CONTRAST TECHNIQUE: Multidetector CT imaging of the chest was performed using the standard protocol during bolus administration of intravenous contrast. Multiplanar CT image reconstructions and MIPs were obtained to evaluate the vascular anatomy. CONTRAST:  75 cc Isovue 370 COMPARISON:  None. FINDINGS: Cardiovascular: Evaluation of the peripheral segmental and subsegmental pulmonary arteries is limited by fairly prominent patient breathing motion artifact but there is no convincing pulmonary embolism identified within the main, lobar or central segmental pulmonary arteries bilaterally. Thoracic aorta is normal in caliber. No aortic aneurysm or dissection. Scattered atherosclerotic changes noted within the aortic arch. Heart size is upper normal. No pericardial effusion. Scattered coronary artery calcifications noted. Mediastinum/Nodes: Scattered small lymph nodes within the mediastinum. No enlarged lymph nodes seen within the mediastinum or perihilar regions. Mildly prominent lymph nodes noted within the axillary regions bilaterally, left slightly greater than right. Esophagus appears normal. Heterogeneous enlargement of the left thyroid lobe. Lungs/Pleura: Lungs are clear. No evidence of pneumonia. No pleural effusion or pneumothorax. Upper Abdomen: Limited images of the upper abdomen are unremarkable. Musculoskeletal: Mild degenerative spurring within the thoracic spine. No acute or suspicious osseous finding. Left-sided PICC line partially imaged. Review  of the MIP images confirms the above findings. IMPRESSION: 1. Patient breathing motion artifact limits characterization of the peripheral pulmonary artery branches. There is no central obstructing pulmonary embolism identified. Due to the breathing motion artifact, I cannot exclude small peripheral pulmonary embolism. 2. Lungs are clear.  No evidence of pneumonia or pulmonary edema. 3. No aortic aneurysm or dissection. 4. Heart size is normal.  No pericardial effusion. 5. Aortic atherosclerosis. 6. **An incidental finding of potential clinical significance has been found. Heterogeneous enlargement of the left thyroid lobe. Recommend nonemergent thyroid ultrasound evaluation.** Electronically Signed   By: Franki Cabot M.D.   On: 11/21/2016 15:59      Management plans discussed with the patient and he is in agreement. Stable for discharge  home  Patient should follow up with dermatology Monday at 8:00 am  CODE STATUS:     Code Status Orders        Start     Ordered   11/21/16 1944  Full code  Continuous     11/21/16 1943    Code Status History    Date Active Date Inactive Code Status Order ID Comments User Context   10/13/2016 11:43 PM 10/16/2016  9:43 PM Full Code [id number]  Samara Deist, DPM Inpatient   10/13/2016 12:30 PM 10/13/2016  2:59 PM Full Code [id number]  Epifanio Lesches, MD Inpatient      TOTAL TIME TAKING CARE OF THIS PATIENT: 40 minutes.    Note: This dictation was prepared with Dragon dictation along with smaller phrase technology. Any transcriptional errors that result from this process are unintentional.  Inri Sobieski M.D on 11/22/2016 at 3:26 PM  Between 7am to 6pm - Pager - [phone] After 6pm go to [email or web address] - Proofreader  [facility]  Office  [phone]  CC: Primary care physician; [place]

## 2016-11-22 NOTE — Progress Notes (Addendum)
Pharmacy Antibiotic Note  JONOTHON BRUNELLE is a 60 y.o. male admitted on 11/21/2016 with wound infection.  Pharmacy has been consulted for levofloxacin dosing.  Plan: Will order levofloxacin 750 mg IV q24h.   Height: 6\' 2"  (188 cm) Weight: 225 lb (102.1 kg) IBW/kg (Calculated) : 82.2  Temp (24hrs), Avg:100.1 F (37.8 C), Min:98.1 F (36.7 C), Max:103.1 F (39.5 C)   Recent Labs Lab 11/21/16 1435 11/21/16 1455 11/21/16 1803 11/22/16 0502  WBC 9.5  --   --  10.2  CREATININE 1.11  --   --  1.13  LATICACIDVEN  --  2.0* 1.2  --     Estimated Creatinine Clearance: 86.5 mL/min (by C-G formula based on SCr of 1.13 mg/dL).    Allergies  Allergen Reactions  . Cleocin [Clindamycin] Rash  . Clindamycin Phosphate Rash  . Other Rash  . Sulfa Antibiotics Rash  . Zosyn [Piperacillin Sod-Tazobactam So] Rash    Antimicrobials this admission: Meropenem 11/24 >> 11/25 Levofloxacin/Flagyl 11/25 >>  Dose adjustments this admission:   Microbiology results:   Thank you for allowing pharmacy to be a part of this patient's care.  Rocky Morel 11/22/2016 10:13 AM

## 2016-11-22 NOTE — Consult Note (Signed)
CC: Evaluation of blistering rash  HPI:  Willie Krueger is a 60 yo male with a 2 day history of itchy blistering rash on arms, legs, chest and groin.  Patient denies oral, nasal, genital or ocular sores, blisters, erosions or ulcers.  Patient had been on zosyn for 5 weeks for osteomyelitis of the left toe.  He recently started xarelto 2 weeks prior for a clot associated with his picc line.  He takes furosemide intermittently when edema worsens.  PMH: DM Hyperlipidemia Hypertension Osteomyelitis with partial amputation of toe 5 weeks ago  Medications prior to admission: Acetaminophen Atorvastatin Vitamin D3 Furosemide Gabapentin glimeprimide Lisinopril Metformin Oxycodone Zosyn xarelto Docusate sodium sildenafil  Allergies: Clindamycin Sulfa  SH: Uses tobacco  ROS:  Patient reports fever.  Denies oral ulcers.  Denies genital ulcers.  Denies dysuria.  Denies pain with eating or swallowing.  Vital signs: bp 127/79 Hr 94 Temp 98.1 RR 19  PE: General- Well developed and well nourished male in no apparent distress Skin: Examination of the face, neck, chest, abdomen, back, genials, bilateral upper and lower extremities was performed and notable for: Vesicles and bullae on an erythematous base distributed on arms, hand, chest, groin, and lower legs.  Bullae are nikolsky negative.  Facial erythema without vesicles. Oral cavity: No erosions, ulcers or crusts noted. Eyes: No erythema, conjunctival or scleral injection, ulcers or crusts.  Assessment and Plan: 60 yo male with history of DM and recent osteomyelitis treated with zosyn for past 5 weeks, now with pruritic bullous eruption.  The differential diagnosis includes drug induced linear IgA bullous dermatosis.  There are reports of zosyn, furosemide, and statins inducing linear IgA bullous dermatosis.  There are no published reports of Linear IgA dermatosis induced by xarelto.  Drug-induced linear IgA typically resolves within 2-6  weeks of discontinuing the offending medication.  The differential diagnosis also includes drug induced bullous pemphigoid (furosemide).  Acute generalized exanthematous pustulosis is unlikely as the patient does not have pustules and the vesicales are tense (typically the pustules in AGEP are flaccid).  Plan: 1. A 22mm punch biopsy for H and E was peformed. 2. A second biopsy for immunofluorescence will be helpful.  Patient will present to our office on Monday morning at 8am for an additional biopsy for direct immunofluorescence. 3. I agree with discontinuing zosyn. 4. Consider discontinuing furosemide and atorvastatin. 5.  Transition to oral prednisone 50mg  daily until seen in our office ([place]).  Thank for the consult and please do not hesitate to contact me if you have any further questions or concerns.

## 2016-11-22 NOTE — Progress Notes (Signed)
Pt being discharged home, discharge instructions reviewed with pt, states understanding, pt with no complaints, no distress or discomfort noted

## 2016-11-24 ENCOUNTER — Telehealth: Payer: Self-pay | Admitting: *Deleted

## 2016-11-24 NOTE — Telephone Encounter (Signed)
patient called and said he received 3 medications from [facility] upon discharge on 11-22-16. Cipro, flagyl and prednisone. Stated he was following his pain medication policy and wanted [place] to be aware.

## 2016-11-26 LAB — CULTURE, BLOOD (ROUTINE X 2)
CULTURE: NO GROWTH
Culture: NO GROWTH

## 2016-11-28 DIAGNOSIS — T3795XA Adverse effect of unspecified systemic anti-infective and antiparasitic, initial encounter: Secondary | ICD-10-CM

## 2016-11-28 DIAGNOSIS — L270 Generalized skin eruption due to drugs and medicaments taken internally: Secondary | ICD-10-CM | POA: Insufficient documentation

## 2016-12-04 DIAGNOSIS — G894 Chronic pain syndrome: Secondary | ICD-10-CM | POA: Insufficient documentation

## 2016-12-04 NOTE — Progress Notes (Signed)
Patient's Name: Willie Krueger  MRN: [id number]  Referring Provider: [facility]  DOB: [birth year]  PCP: [facility]  DOS: 12/05/2016  Note by: Kathlen Brunswick. Dossie Arbour, MD  Service setting: Ambulatory outpatient  Specialty: Interventional Pain Management  Location: [facility] (AMB) Pain Management Facility    Patient type: Established   Primary Reason(s) for Visit: Encounter for prescription drug management (Level of risk: moderate) CC: Back Pain (Lower); Hand Pain (both); Arm Pain (both); Leg Pain (both legs); and Foot Pain (both)  HPI  Willie Krueger is a 60 y.o. year old, male patient, who comes today for a medication management evaluation. He has Toe ulcer ([place]); Cervical spinal cord compression ([place]); Cardiac murmur; Chronic hepatitis C virus infection ([place]); HLD (hyperlipidemia); Benign hypertension; ED (erectile dysfunction) of organic origin; MI (mitral incompetence); Adiposity; Current smoker; History of decompression of median nerve; Long term current use of opiate analgesic; Long term prescription opiate use; Opiate use (112.5 MME/Day); Opiate dependence ([place]); Encounter for therapeutic drug level monitoring; Chronic neck pain (Right side); Cervical spondylosis; Cervical post-laminectomy syndrome; Chronic low back pain (Location of Primary Source of Pain) (Midline pain) (R>L); Chronic cervical radicular pain (Bilateral) (R>L); Chronic lumbar radicular pain (Bilateral) (R>L); Substance use disorder Risk: LOW; Failed back surgical syndrome; Chronic lower extremity pain (Location of Secondary source of pain) (Bilateral) (R>L); History of carpal tunnel release of both wrists; Grade I anterolisthesis of C7 on T1; Encounter for chronic pain management; Elevated C-reactive protein (CRP); Diabetic osteomyelitis ([place]); Neuropathy ([place]); Obesity; Neurogenic pain; Type 2 diabetes mellitus with foot ulcer (CODE) ([place]); Diabetic peripheral neuropathy ([place]); Chronic shoulder pain (Location of Tertiary source  of pain) (Bilateral) (R>L); Lumbar facet syndrome (Location of Primary Source of Pain) (Bilateral) (R>L); Abnormal MRI, lumbar spine (05/19/2016); Lumbar spondylosis; Diabetic infection of left foot ([place]); Allergy to drug; Diabetic foot ulcer with osteomyelitis ([place]); Chronic pain syndrome; and Allergic drug rash due to anti-infective agent on his problem list. His primarily concern today is the Back Pain (Lower); Hand Pain (both); Arm Pain (both); Leg Pain (both legs); and Foot Pain (both)  Pain Assessment: Self-Reported Pain Score: 4 /10             Reported level is compatible with observation.       Pain Type: Chronic pain Pain Location: Back Pain Orientation: Lower Pain Descriptors / Indicators: Aching, Radiating Pain Frequency: Constant  Willie Krueger was last seen on 11/13/2016 for medication management. During today's appointment we reviewed Willie Krueger chronic pain status, as well as his outpatient medication regimen. He had a toe amputation and had some narcotics prescribed to him by Dr. Nicholes Mango. He had this prescription filled on 10/29/2016.  The patient  reports that he does not use drugs. His body mass index is 29.4 kg/m.  Further details on both, my assessment(s), as well as the proposed treatment plan, please see below.  Controlled Substance Pharmacotherapy Assessment REMS (Risk Evaluation and Mitigation Strategy)  Analgesic:Oxycodone IR 15 mg 5 times a day (75 mg/day). MME/day:112.5 mg/day [name], RN  12/05/2016  1:18 PM  Sign at close encounter Nursing Pain Medication Assessment:  Safety precautions to be maintained throughout the outpatient stay will include: orient to surroundings, keep bed in low position, maintain call bell within reach at all times, provide assistance with transfer out of bed and ambulation.  Medication Inspection Compliance: Pill count conducted under aseptic conditions, in front of the patient. Neither the pills nor the bottle was removed from  the patient's  sight at any time. Once count was completed pills were immediately returned to the patient in their original bottle.  Medication: Oxycodone IR Pill Count: 74 of 150 pills remain Bottle Appearance: Standard pharmacy container. Clearly labeled. Filled Date: 66 / 18 / 2017 Medication last intake: 12/05/2016   Pharmacokinetics: Liberation and absorption (onset of action): WNL Distribution (time to peak effect): WNL Metabolism and excretion (duration of action): WNL         Pharmacodynamics: Desired effects: Analgesia: Willie Krueger reports >50% benefit. Functional ability: Patient reports that medication allows him to accomplish basic ADLs Clinically meaningful improvement in function (CMIF): Sustained CMIF goals met Perceived effectiveness: Described as relatively effective, allowing for increase in activities of daily living (ADL) Undesirable effects: Side-effects or Adverse reactions: None reported Monitoring: [place] PMP: Online review of the past 64-monthperiod conducted. Compliant with practice rules and regulations List of all UDS test(s) done:  Lab Results  Component Value Date   TOXASSSELUR FINAL 05/28/2016   TOXASSSELUR FINAL 02/27/2016   TOXASSSELUR FINAL 12/03/2015   TOXASSSELUR FINAL 10/31/2015   Last UDS on record: ToxAssure Select 13  Date Value Ref Range Status  05/28/2016 FINAL  Final    Comment:    ==================================================================== TOXASSURE SELECT 13 (MW) ==================================================================== Test                             Result       Flag       Units Drug Present and Declared for Prescription Verification   Oxycodone                      >4975        EXPECTED   ng/mg creat   Oxymorphone                    1287         EXPECTED   ng/mg creat   Noroxycodone                   >4975        EXPECTED   ng/mg creat   Noroxymorphone                 354          EXPECTED   ng/mg creat     Sources of oxycodone are scheduled prescription medications.    Oxymorphone, noroxycodone, and noroxymorphone are expected    metabolites of oxycodone. Oxymorphone is also available as a    scheduled prescription medication. ==================================================================== Test                      Result    Flag   Units      Ref Range   Creatinine              201              mg/dL      >=20 ==================================================================== Declared Medications:  The flagging and interpretation on this report are based on the  following declared medications.  Unexpected results may arise from  inaccuracies in the declared medications.  **Note: The testing scope of this panel includes these medications:  Oxycodone (Roxicodone)  **Note: The testing scope of this panel does not include following  reported medications:  Acetaminophen  Acetaminophen (Tylenol PM)  Aspirin  Diphenhydramine (Tylenol PM)  Furosemide (Lasix)  Gabapentin  Glimepiride (  Amaryl)  Lisinopril  Metformin  Naloxone (Narcan)  Pravastatin (Pravachol)  Sildenafil (Viagra)  Vitamin D3 ==================================================================== For clinical consultation, please call [phone]. ====================================================================    UDS interpretation: Compliant          Medication Assessment Form: Reviewed. Patient indicates being compliant with therapy Treatment compliance: Compliant Risk Assessment Profile: Aberrant behavior: See prior evaluations. None observed or detected today Comorbid factors increasing risk of overdose: See prior notes. No additional risks detected today Risk of substance use disorder (SUD): Very High Opioid Risk Tool (ORT) Total Score: 00  Interpretation Table:  Score <3 = Low Risk for SUD  Score between 4-7 = Moderate Risk for SUD  Score >8 = High Risk for Opioid Abuse   Risk Mitigation  Strategies:  Patient Counseling: Covered Patient-Prescriber Agreement (PPA): Present and active  Notification to other healthcare providers: Done  Pharmacologic Plan: No change in therapy, at this time  Laboratory Chemistry  Inflammation Markers Lab Results  Component Value Date   ESRSEDRATE 10 11/21/2016   CRP 28.9 (H) 10/13/2016   Renal Function Lab Results  Component Value Date   BUN 21 (H) 11/22/2016   CREATININE 1.13 11/22/2016   GFRAA >60 11/22/2016   GFRNONAA >60 11/22/2016   Hepatic Function Lab Results  Component Value Date   AST 22 11/21/2016   ALT 11 (L) 11/21/2016   ALBUMIN 3.9 11/21/2016   Electrolytes Lab Results  Component Value Date   NA 133 (L) 11/22/2016   K 4.4 11/22/2016   CL 103 11/22/2016   CALCIUM 8.8 (L) 11/22/2016   MG 1.9 02/27/2016   Pain Modulating Vitamins No results found for: Marveen Reeks, [id number], BJ6283TD1, 25OHVITD1, 25OHVITD2, 25OHVITD3, VITAMINB12 Coagulation Parameters Lab Results  Component Value Date   PLT 193 11/22/2016   Cardiovascular Lab Results  Component Value Date   BNP 86.0 11/21/2016   HGB 14.3 11/22/2016   HCT 41.7 11/22/2016   Note: Lab results reviewed.  Recent Diagnostic Imaging Review  Ct Angio Chest Pe W And/or Wo Contrast  Result Date: 11/21/2016 CLINICAL DATA:  Pt very poor historian. Per note in pt chart "Pt reports left arm swelling since yesterday, reports he has a known DVT in left arm and has a PICC line in left arm. Pt reports generalized rash to entire body". Pt also with increased shortness of breath per MD EXAM: CT ANGIOGRAPHY CHEST WITH CONTRAST TECHNIQUE: Multidetector CT imaging of the chest was performed using the standard protocol during bolus administration of intravenous contrast. Multiplanar CT image reconstructions and MIPs were obtained to evaluate the vascular anatomy. CONTRAST:  75 cc Isovue 370 COMPARISON:  None. FINDINGS: Cardiovascular: Evaluation of the peripheral  segmental and subsegmental pulmonary arteries is limited by fairly prominent patient breathing motion artifact but there is no convincing pulmonary embolism identified within the main, lobar or central segmental pulmonary arteries bilaterally. Thoracic aorta is normal in caliber. No aortic aneurysm or dissection. Scattered atherosclerotic changes noted within the aortic arch. Heart size is upper normal. No pericardial effusion. Scattered coronary artery calcifications noted. Mediastinum/Nodes: Scattered small lymph nodes within the mediastinum. No enlarged lymph nodes seen within the mediastinum or perihilar regions. Mildly prominent lymph nodes noted within the axillary regions bilaterally, left slightly greater than right. Esophagus appears normal. Heterogeneous enlargement of the left thyroid lobe. Lungs/Pleura: Lungs are clear. No evidence of pneumonia. No pleural effusion or pneumothorax. Upper Abdomen: Limited images of the upper abdomen are unremarkable. Musculoskeletal: Mild degenerative spurring within the thoracic spine. No acute or  suspicious osseous finding. Left-sided PICC line partially imaged. Review of the MIP images confirms the above findings. IMPRESSION: 1. Patient breathing motion artifact limits characterization of the peripheral pulmonary artery branches. There is no central obstructing pulmonary embolism identified. Due to the breathing motion artifact, I cannot exclude small peripheral pulmonary embolism. 2. Lungs are clear.  No evidence of pneumonia or pulmonary edema. 3. No aortic aneurysm or dissection. 4. Heart size is normal.  No pericardial effusion. 5. Aortic atherosclerosis. 6. **An incidental finding of potential clinical significance has been found. Heterogeneous enlargement of the left thyroid lobe. Recommend nonemergent thyroid ultrasound evaluation.** Electronically Signed   By: Franki Cabot M.D.   On: 11/21/2016 15:59   Note: Imaging results reviewed.          Meds  The  patient has a current medication list which includes the following prescription(s): acetaminophen, vitamin d3, cvs bisacodyl, diphenhydramine, docusate sodium, furosemide, gabapentin, glimepiride, lisinopril, metformin, oxycodone, oxycodone, prednisone, sildenafil, and xarelto starter pack.  Current Outpatient Prescriptions on File Prior to Visit  Medication Sig  . Cholecalciferol (VITAMIN D3) 2000 units capsule Take 2,000 Units by mouth daily.   . diphenhydrAMINE (BENADRYL) 25 MG tablet Take 25 mg by mouth every 6 (six) hours as needed.  . docusate sodium (COLACE) 100 MG capsule Take 1 capsule (100 mg total) by mouth 2 (two) times daily.  Marland Kitchen glimepiride (AMARYL) 2 MG tablet Take 2 mg by mouth daily with breakfast.   . lisinopril (PRINIVIL,ZESTRIL) 40 MG tablet Take 40 mg by mouth daily.  . metFORMIN (GLUCOPHAGE-XR) 500 MG 24 hr tablet Take 500 mg by mouth 2 (two) times daily.  . predniSONE (DELTASONE) 10 MG tablet Take 5 tablets (50 mg total) by mouth daily with breakfast.  . sildenafil (VIAGRA) 100 MG tablet Take 100 mg by mouth daily as needed for erectile dysfunction.  Alveda Reasons STARTER PACK 15 & 20 MG TBPK Take 1 tablet by mouth 2 (two) times daily.    No current facility-administered medications on file prior to visit.    ROS  Constitutional: Denies any fever or chills Gastrointestinal: No reported hemesis, hematochezia, vomiting, or acute GI distress Musculoskeletal: Denies any acute onset joint swelling, redness, loss of ROM, or weakness Neurological: No reported episodes of acute onset apraxia, aphasia, dysarthria, agnosia, amnesia, paralysis, loss of coordination, or loss of consciousness  Allergies  Mr. Degroat is allergic to cleocin [clindamycin]; clindamycin phosphate; other; sulfa antibiotics; and zosyn [piperacillin sod-tazobactam so].  [place]  Drug: Mr. Cosgriff  reports that he does not use drugs. Alcohol:  reports that he does not drink alcohol. Tobacco:  reports that he has  been smoking Cigarettes.  He has been smoking about 1.50 packs per day. He has never used smokeless tobacco. Medical:  has a past medical history of Chronic pain; Chronic pain associated with significant psychosocial dysfunction (05/04/2015); Diabetes mellitus without complication ([place]); Heart murmur; History of spinal stenosis (10/31/2015); Hyperlipidemia; Hypertension; Leg weakness (07/07/2014); Mitral valve regurgitation; Nerve root inflammation (06/26/2014); and Osteoarthritis. Family: family history includes Cancer in his mother; Diabetes in his mother; Heart disease in his father.  Past Surgical History:  Procedure Laterality Date  . AMPUTATION TOE Left 10/2016   partial  . CARPAL TUNNEL RELEASE    . FOOT SURGERY Left 09/2016  . IRRIGATION AND DEBRIDEMENT FOOT Left 10/13/2016   Procedure: IRRIGATION AND DEBRIDEMENT FOOT;  Surgeon: Samara Deist, DPM;  Location: [facility] ORS;  Service: Podiatry;  Laterality: Left;  . PICC LINE PLACE PERIPHERAL [facility]  HX) Left [id number]  . SPINE SURGERY     Constitutional Exam  General appearance: Well nourished, well developed, and well hydrated. In no apparent acute distress Vitals:   12/05/16 1304  BP: 134/74  Pulse: (!) 103  Resp: 16  Temp: 98.6 F (37 C)  TempSrc: Oral  SpO2: 97%  Weight: 229 lb (103.9 kg)  Height: _0  (1.88 m)   BMI Assessment: Estimated body mass index is 29.4 kg/m as calculated from the following:   Height as of this encounter: _1  (1.88 m).   Weight as of this encounter: 229 lb (103.9 kg).  BMI interpretation table: BMI level Category Range association with higher incidence of chronic pain  <18 kg/m2 Underweight   18.5-24.9 kg/m2 Ideal body weight   25-29.9 kg/m2 Overweight Increased incidence by 20%  30-34.9 kg/m2 Obese (Class I) Increased incidence by 68%  35-39.9 kg/m2 Severe obesity (Class II) Increased incidence by 136%  >40 kg/m2 Extreme obesity (Class III) Increased incidence by 254%   BMI Readings from Last 4  Encounters:  12/05/16 29.40 kg/m  11/21/16 28.89 kg/m  11/13/16 29.40 kg/m  10/16/16 28.35 kg/m   Wt Readings from Last 4 Encounters:  12/05/16 229 lb (103.9 kg)  11/21/16 225 lb (102.1 kg)  11/13/16 229 lb (103.9 kg)  10/16/16 220 lb 12.8 oz (100.2 kg)  Psych/Mental status: Alert, oriented x 3 (person, place, & time) Eyes: PERLA Respiratory: No evidence of acute respiratory distress  Cervical Spine Exam  Inspection: No masses, redness, or swelling Alignment: Symmetrical Functional ROM: Unrestricted ROM Stability: No instability detected Muscle strength & Tone: Functionally intact Sensory: Unimpaired Palpation: Non-contributory  Upper Extremity (UE) Exam    Side: Right upper extremity  Side: Left upper extremity  Inspection: No masses, redness, swelling, or asymmetry  Inspection: No masses, redness, swelling, or asymmetry  Functional ROM: Unrestricted ROM          Functional ROM: Unrestricted ROM          Muscle strength & Tone: Functionally intact  Muscle strength & Tone: Functionally intact  Sensory: Unimpaired  Sensory: Unimpaired  Palpation: Non-contributory  Palpation: Non-contributory   Thoracic Spine Exam  Inspection: No masses, redness, or swelling Alignment: Symmetrical Functional ROM: Unrestricted ROM Stability: No instability detected Sensory: Unimpaired Muscle strength & Tone: Functionally intact Palpation: Non-contributory  Lumbar Spine Exam  Inspection: No masses, redness, or swelling Alignment: Symmetrical Functional ROM: Unrestricted ROM Stability: No instability detected Muscle strength & Tone: Functionally intact Sensory: Unimpaired Palpation: Non-contributory Provocative Tests: Lumbar Hyperextension and rotation test: evaluation deferred today       Patrick's Maneuver: evaluation deferred today              Gait & Posture Assessment  Ambulation: Unassisted Gait: Relatively normal for age and body habitus Posture: WNL   Lower Extremity  Exam    Side: Right lower extremity  Side: Left lower extremity  Inspection: No masses, redness, swelling, or asymmetry  Inspection: No masses, redness, swelling, or asymmetry  Functional ROM: Unrestricted ROM          Functional ROM: Unrestricted ROM          Muscle strength & Tone: Functionally intact  Muscle strength & Tone: Functionally intact  Sensory: Unimpaired  Sensory: Unimpaired  Palpation: Non-contributory  Palpation: Non-contributory   Assessment  Primary Diagnosis & Pertinent Problem List: The primary encounter diagnosis was Chronic pain syndrome. Diagnoses of Long term prescription opiate use, Opiate use (112.5 MME/Day), Encounter for therapeutic drug  level monitoring, Chronic low back pain (Location of Primary Source of Pain) (Midline pain) (R>L), Chronic lower extremity pain (Location of Secondary source of pain) (Bilateral) (R>L), Chronic shoulder pain (Location of Tertiary source of pain) (Bilateral) (R>L), Lumbar facet syndrome (Location of Primary Source of Pain) (Bilateral) (R>L), and Neurogenic pain were also pertinent to this visit.  Visit Diagnosis: 1. Chronic pain syndrome   2. Long term prescription opiate use   3. Opiate use (112.5 MME/Day)   4. Encounter for therapeutic drug level monitoring   5. Chronic low back pain (Location of Primary Source of Pain) (Midline pain) (R>L)   6. Chronic lower extremity pain (Location of Secondary source of pain) (Bilateral) (R>L)   7. Chronic shoulder pain (Location of Tertiary source of pain) (Bilateral) (R>L)   8. Lumbar facet syndrome (Location of Primary Source of Pain) (Bilateral) (R>L)   9. Neurogenic pain    Plan of Care  Pharmacotherapy (Medications Ordered): Meds ordered this encounter  Medications  . oxyCODONE (ROXICODONE) 15 MG immediate release tablet    Sig: Take 1 tablet (15 mg total) by mouth 5 (five) times daily as needed for pain.    Dispense:  150 tablet    Refill:  0    Do not place this medication, or  any other prescription from our practice, on "Automatic Refill". Patient may have prescription filled one day early if pharmacy is closed on scheduled refill date. Do not fill until: 12/15/16 To last until: 01/14/17  . gabapentin (NEURONTIN) 600 MG tablet    Sig: Take 2 tablets (1,200 mg total) by mouth every 6 (six) hours.    Dispense:  240 tablet    Refill:  2    Do not place this medication, or any other prescription from our practice, on "Automatic Refill". Patient may have prescription filled one day early if pharmacy is closed on scheduled refill date.  Marland Kitchen oxyCODONE (ROXICODONE) 15 MG immediate release tablet    Sig: Take 1 tablet (15 mg total) by mouth 5 (five) times daily as needed for pain.    Dispense:  150 tablet    Refill:  0    Do not place this medication, or any other prescription from our practice, on "Automatic Refill". Patient may have prescription filled one day early if pharmacy is closed on scheduled refill date. Do not fill until: 01/14/17 To last until: 02/13/17   New Prescriptions   No medications on file   Medications administered today: Mr. Maselli had no medications administered during this visit. Lab-work, procedure(s), and/or referral(s): Orders Placed This Encounter  Procedures  . Radiofrequency,Lumbar  . ToxASSURE Select 13 (MW), Urine   Imaging and/or referral(s): None  Interventional therapies: Planned, scheduled, and/or pending:   Right Lumbar facet RFA.   Considering:   Right Lumbar facet RFA.   Palliative PRN treatment(s):   Not at this time.   Provider-requested follow-up: Return in about 2 months (around 02/05/2017) for Med-Mgmt, in addition, (PRN) procedure.  Future Appointments Date Time Provider [place]  02/09/2017 10:45 AM Milinda Pointer, MD [facility] None   Primary Care Physician: [facility] Location: [facility] Note by: Shaindel Sweeten A. Dossie Arbour, M.D, DABA, DABAPM, DABPM, DABIPP,  FIPP Date: 12/05/16; Time: 1:51 PM  Pain Score Disclaimer: We use the NRS-11 scale. This is a self-reported, subjective measurement of pain severity with only modest accuracy. It is used primarily to identify changes within a particular patient. It must be understood that outpatient pain scales are  significantly less accurate that those used for research, where they can be applied under ideal controlled circumstances with minimal exposure to variables. In reality, the score is likely to be a combination of pain intensity and pain affect, where pain affect describes the degree of emotional arousal or changes in action readiness caused by the sensory experience of pain. Factors such as social and work situation, setting, emotional state, anxiety levels, expectation, and prior pain experience may influence pain perception and show large inter-individual differences that may also be affected by time variables.  Patient instructions provided during this appointment: There are no Patient Instructions on file for this visit.

## 2016-12-05 ENCOUNTER — Encounter: Payer: Self-pay | Admitting: Pain Medicine

## 2016-12-05 ENCOUNTER — Ambulatory Visit: Payer: 59 | Attending: Pain Medicine | Admitting: Pain Medicine

## 2016-12-05 VITALS — BP 134/74 | HR 103 | Temp 98.6°F | Resp 16 | Ht 74.0 in | Wt 229.0 lb

## 2016-12-05 DIAGNOSIS — F1190 Opioid use, unspecified, uncomplicated: Secondary | ICD-10-CM

## 2016-12-05 DIAGNOSIS — Z7984 Long term (current) use of oral hypoglycemic drugs: Secondary | ICD-10-CM | POA: Insufficient documentation

## 2016-12-05 DIAGNOSIS — Z7901 Long term (current) use of anticoagulants: Secondary | ICD-10-CM | POA: Insufficient documentation

## 2016-12-05 DIAGNOSIS — M79605 Pain in left leg: Secondary | ICD-10-CM

## 2016-12-05 DIAGNOSIS — M792 Neuralgia and neuritis, unspecified: Secondary | ICD-10-CM

## 2016-12-05 DIAGNOSIS — M79604 Pain in right leg: Secondary | ICD-10-CM

## 2016-12-05 DIAGNOSIS — M79642 Pain in left hand: Secondary | ICD-10-CM | POA: Diagnosis not present

## 2016-12-05 DIAGNOSIS — G894 Chronic pain syndrome: Secondary | ICD-10-CM

## 2016-12-05 DIAGNOSIS — M1288 Other specific arthropathies, not elsewhere classified, other specified site: Secondary | ICD-10-CM

## 2016-12-05 DIAGNOSIS — M79672 Pain in left foot: Secondary | ICD-10-CM | POA: Insufficient documentation

## 2016-12-05 DIAGNOSIS — M79602 Pain in left arm: Secondary | ICD-10-CM | POA: Insufficient documentation

## 2016-12-05 DIAGNOSIS — M47816 Spondylosis without myelopathy or radiculopathy, lumbar region: Secondary | ICD-10-CM

## 2016-12-05 DIAGNOSIS — M25511 Pain in right shoulder: Secondary | ICD-10-CM

## 2016-12-05 DIAGNOSIS — Z7982 Long term (current) use of aspirin: Secondary | ICD-10-CM | POA: Insufficient documentation

## 2016-12-05 DIAGNOSIS — I10 Essential (primary) hypertension: Secondary | ICD-10-CM | POA: Insufficient documentation

## 2016-12-05 DIAGNOSIS — E11621 Type 2 diabetes mellitus with foot ulcer: Secondary | ICD-10-CM | POA: Diagnosis not present

## 2016-12-05 DIAGNOSIS — E669 Obesity, unspecified: Secondary | ICD-10-CM | POA: Insufficient documentation

## 2016-12-05 DIAGNOSIS — Z79899 Other long term (current) drug therapy: Secondary | ICD-10-CM | POA: Insufficient documentation

## 2016-12-05 DIAGNOSIS — M545 Low back pain: Secondary | ICD-10-CM | POA: Diagnosis not present

## 2016-12-05 DIAGNOSIS — M79641 Pain in right hand: Secondary | ICD-10-CM | POA: Insufficient documentation

## 2016-12-05 DIAGNOSIS — M5412 Radiculopathy, cervical region: Secondary | ICD-10-CM | POA: Insufficient documentation

## 2016-12-05 DIAGNOSIS — Z6829 Body mass index (BMI) 29.0-29.9, adult: Secondary | ICD-10-CM | POA: Insufficient documentation

## 2016-12-05 DIAGNOSIS — M79601 Pain in right arm: Secondary | ICD-10-CM | POA: Insufficient documentation

## 2016-12-05 DIAGNOSIS — Z79891 Long term (current) use of opiate analgesic: Secondary | ICD-10-CM

## 2016-12-05 DIAGNOSIS — F17210 Nicotine dependence, cigarettes, uncomplicated: Secondary | ICD-10-CM | POA: Diagnosis not present

## 2016-12-05 DIAGNOSIS — M47892 Other spondylosis, cervical region: Secondary | ICD-10-CM | POA: Insufficient documentation

## 2016-12-05 DIAGNOSIS — Z880 Allergy status to penicillin: Secondary | ICD-10-CM | POA: Insufficient documentation

## 2016-12-05 DIAGNOSIS — E785 Hyperlipidemia, unspecified: Secondary | ICD-10-CM | POA: Insufficient documentation

## 2016-12-05 DIAGNOSIS — M25512 Pain in left shoulder: Secondary | ICD-10-CM

## 2016-12-05 DIAGNOSIS — Z5181 Encounter for therapeutic drug level monitoring: Secondary | ICD-10-CM | POA: Diagnosis present

## 2016-12-05 DIAGNOSIS — E1142 Type 2 diabetes mellitus with diabetic polyneuropathy: Secondary | ICD-10-CM | POA: Insufficient documentation

## 2016-12-05 DIAGNOSIS — L97509 Non-pressure chronic ulcer of other part of unspecified foot with unspecified severity: Secondary | ICD-10-CM | POA: Diagnosis not present

## 2016-12-05 DIAGNOSIS — M869 Osteomyelitis, unspecified: Secondary | ICD-10-CM | POA: Insufficient documentation

## 2016-12-05 DIAGNOSIS — E1169 Type 2 diabetes mellitus with other specified complication: Secondary | ICD-10-CM | POA: Insufficient documentation

## 2016-12-05 DIAGNOSIS — M79671 Pain in right foot: Secondary | ICD-10-CM | POA: Diagnosis not present

## 2016-12-05 DIAGNOSIS — G8929 Other chronic pain: Secondary | ICD-10-CM

## 2016-12-05 MED ORDER — GABAPENTIN 600 MG PO TABS: 1200.0000 mg | ORAL_TABLET | Freq: Four times a day (QID) | ORAL | 2 refills | Status: DC

## 2016-12-05 MED ORDER — OXYCODONE HCL 15 MG PO TABS: 15.0000 mg | ORAL_TABLET | Freq: Every day | ORAL | 0 refills | Status: DC | PRN

## 2016-12-05 NOTE — Progress Notes (Signed)
Nursing Pain Medication Assessment:  Safety precautions to be maintained throughout the outpatient stay will include: orient to surroundings, keep bed in low position, maintain call bell within reach at all times, provide assistance with transfer out of bed and ambulation.  Medication Inspection Compliance: Pill count conducted under aseptic conditions, in front of the patient. Neither the pills nor the bottle was removed from the patient's sight at any time. Once count was completed pills were immediately returned to the patient in their original bottle.  Medication: Oxycodone IR Pill Count: 74 of 150 pills remain Bottle Appearance: Standard pharmacy container. Clearly labeled. Filled Date: 7 / 18 / 2017 Medication last intake: 12/05/2016

## 2016-12-13 LAB — TOXASSURE SELECT 13 (MW), URINE

## 2017-02-02 ENCOUNTER — Other Ambulatory Visit
Admission: RE | Admit: 2017-02-02 | Discharge: 2017-02-02 | Disposition: A | Discharge status: Home / Self Care | Payer: 59 | Source: Ambulatory Visit | Attending: Pain Medicine | Admitting: Pain Medicine

## 2017-02-02 ENCOUNTER — Ambulatory Visit: Payer: 59 | Attending: Pain Medicine | Admitting: Pain Medicine

## 2017-02-02 ENCOUNTER — Encounter: Payer: Self-pay | Admitting: Pain Medicine

## 2017-02-02 VITALS — BP 149/94 | HR 79 | Temp 98.5°F | Resp 16 | Ht 74.0 in | Wt 229.0 lb

## 2017-02-02 DIAGNOSIS — Z7984 Long term (current) use of oral hypoglycemic drugs: Secondary | ICD-10-CM | POA: Insufficient documentation

## 2017-02-02 DIAGNOSIS — E049 Nontoxic goiter, unspecified: Secondary | ICD-10-CM

## 2017-02-02 DIAGNOSIS — E11621 Type 2 diabetes mellitus with foot ulcer: Secondary | ICD-10-CM | POA: Insufficient documentation

## 2017-02-02 DIAGNOSIS — Z79899 Other long term (current) drug therapy: Secondary | ICD-10-CM | POA: Insufficient documentation

## 2017-02-02 DIAGNOSIS — M792 Neuralgia and neuritis, unspecified: Secondary | ICD-10-CM | POA: Diagnosis not present

## 2017-02-02 DIAGNOSIS — G894 Chronic pain syndrome: Secondary | ICD-10-CM

## 2017-02-02 DIAGNOSIS — Z79891 Long term (current) use of opiate analgesic: Secondary | ICD-10-CM | POA: Diagnosis not present

## 2017-02-02 DIAGNOSIS — E1169 Type 2 diabetes mellitus with other specified complication: Secondary | ICD-10-CM | POA: Insufficient documentation

## 2017-02-02 DIAGNOSIS — L270 Generalized skin eruption due to drugs and medicaments taken internally: Secondary | ICD-10-CM | POA: Insufficient documentation

## 2017-02-02 DIAGNOSIS — F17210 Nicotine dependence, cigarettes, uncomplicated: Secondary | ICD-10-CM | POA: Diagnosis not present

## 2017-02-02 DIAGNOSIS — Z880 Allergy status to penicillin: Secondary | ICD-10-CM | POA: Diagnosis not present

## 2017-02-02 DIAGNOSIS — M79604 Pain in right leg: Secondary | ICD-10-CM | POA: Diagnosis not present

## 2017-02-02 DIAGNOSIS — M25511 Pain in right shoulder: Secondary | ICD-10-CM | POA: Diagnosis not present

## 2017-02-02 DIAGNOSIS — M25512 Pain in left shoulder: Secondary | ICD-10-CM | POA: Diagnosis not present

## 2017-02-02 DIAGNOSIS — M545 Low back pain: Secondary | ICD-10-CM

## 2017-02-02 DIAGNOSIS — G8929 Other chronic pain: Secondary | ICD-10-CM

## 2017-02-02 DIAGNOSIS — M79601 Pain in right arm: Secondary | ICD-10-CM | POA: Diagnosis not present

## 2017-02-02 DIAGNOSIS — Z5181 Encounter for therapeutic drug level monitoring: Secondary | ICD-10-CM | POA: Insufficient documentation

## 2017-02-02 DIAGNOSIS — M47816 Spondylosis without myelopathy or radiculopathy, lumbar region: Secondary | ICD-10-CM

## 2017-02-02 DIAGNOSIS — M79605 Pain in left leg: Secondary | ICD-10-CM | POA: Diagnosis not present

## 2017-02-02 DIAGNOSIS — M1288 Other specific arthropathies, not elsewhere classified, other specified site: Secondary | ICD-10-CM

## 2017-02-02 DIAGNOSIS — E1142 Type 2 diabetes mellitus with diabetic polyneuropathy: Secondary | ICD-10-CM | POA: Insufficient documentation

## 2017-02-02 DIAGNOSIS — F1190 Opioid use, unspecified, uncomplicated: Secondary | ICD-10-CM

## 2017-02-02 MED ORDER — OXYCODONE HCL 15 MG PO TABS: 15.0000 mg | ORAL_TABLET | Freq: Every day | ORAL | 0 refills | Status: DC | PRN

## 2017-02-02 MED ORDER — GABAPENTIN 600 MG PO TABS: 1200.0000 mg | ORAL_TABLET | Freq: Four times a day (QID) | ORAL | 0 refills | Status: DC

## 2017-02-02 NOTE — Progress Notes (Signed)
Patient's Name: Willie Krueger  MRN: [id number]  Referring Provider: [facility]  DOB: [birth year]  PCP: [facility]  DOS: 02/02/2017  Note by: Kathlen Brunswick. Dossie Arbour, MD  Service setting: Ambulatory outpatient  Specialty: Interventional Pain Management  Location: [facility] (AMB) Pain Management Facility    Patient type: Established   Primary Reason(s) for Visit: Encounter for prescription drug management (Level of risk: moderate) CC: Back Pain (lower); Shoulder Pain (right); Arm Pain (both); Hand Pain (both); Leg Pain (both thighs); and Foot Pain (all over the feet , nerve pain)  HPI  Willie Krueger is a 61 y.o. year old, male patient, who comes today for a medication management evaluation. He has Toe ulcer ([place]); Cervical spinal cord compression ([place]); Cardiac murmur; Chronic hepatitis C virus infection ([place]); HLD (hyperlipidemia); Benign hypertension; ED (erectile dysfunction) of organic origin; MI (mitral incompetence); Adiposity; Current smoker; History of decompression of median nerve; Long term current use of opiate analgesic; Long term prescription opiate use; Opiate use (112.5 MME/Day); Opiate dependence ([place]); Encounter for therapeutic drug level monitoring; Chronic neck pain (Right side); Cervical spondylosis; Cervical post-laminectomy syndrome; Chronic low back pain (Location of Primary Source of Pain) (Midline pain) (R>L); Chronic cervical radicular pain (Bilateral) (R>L); Chronic lumbar radicular pain (Bilateral) (R>L); Substance use disorder Risk: LOW; Failed back surgical syndrome; Chronic lower extremity pain (Location of Secondary source of pain) (Bilateral) (R>L); History of carpal tunnel release of both wrists; Grade I anterolisthesis of C7 on T1; Encounter for chronic pain management; Elevated C-reactive protein (CRP); Diabetic osteomyelitis ([place]); Neuropathy ([place]); Obesity; Neurogenic pain; Type 2 diabetes mellitus with foot ulcer (CODE) ([place]); Diabetic peripheral neuropathy ([place]);  Chronic shoulder pain (Location of Tertiary source of pain) (Bilateral) (R>L); Lumbar facet syndrome (Location of Primary Source of Pain) (Bilateral) (R>L); Abnormal MRI, lumbar spine (05/19/2016); Lumbar spondylosis; Diabetic infection of left foot ([place]); Allergy to drug; Diabetic foot ulcer with osteomyelitis ([place]); Chronic pain syndrome; Allergic drug rash due to anti-infective agent; and Enlarged thyroid on his problem list. His primarily concern today is the Back Pain (lower); Shoulder Pain (right); Arm Pain (both); Hand Pain (both); Leg Pain (both thighs); and Foot Pain (all over the feet , nerve pain)  Pain Assessment: Self-Reported Pain Score: 5 /10             Reported level is compatible with observation.       Pain Type: Chronic pain Pain Location: Back Pain Orientation: Lower Pain Descriptors / Indicators: Aching, Constant, Radiating Pain Frequency: Constant  Willie Krueger was last seen on 12/05/2016 for medication management. During today's appointment we reviewed Willie Krueger chronic pain status, as well as his outpatient medication regimen. In reviewing his medical records, I found a CT angiogram of his chest done in November. This test did mention him to have an enlargement of the thyroid and upon asking him about this, it seemed to be new used to him. I couldn't find any follow-up lab work or the ultrasound recommended. Because of this, I have ordered it today and I have instructed the patient to follow-up with his primary care physician. We are pending to have his lumbar facet radiofrequency done in a couple weeks.  The patient  reports that he does not use drugs. His body mass index is 29.4 kg/m.  Further details on both, my assessment(s), as well as the proposed treatment plan, please see below.  Controlled Substance Pharmacotherapy Assessment REMS (Risk Evaluation and Mitigation Strategy)  Analgesic:Oxycodone IR 15 mg 5 times a  day (75 mg/day). MME/day:112.5 mg/day Willie Slack, RN  02/02/2017  2:17 PM  Sign at close encounter Nursing Pain Medication Assessment:  Safety precautions to be maintained throughout the outpatient stay will include: orient to surroundings, keep bed in low position, maintain call bell within reach at all times, provide assistance with transfer out of bed and ambulation.  Medication Inspection Compliance: Pill count conducted under aseptic conditions, in front of the patient. Neither the pills nor the bottle was removed from the patient's sight at any time. Once count was completed pills were immediately returned to the patient in their original bottle.  Medication: Oxycodone IR Pill/Patch Count: 84 of 150 pills remain Bottle Appearance: Standard pharmacy container. Clearly labeled. Filled Date: 01 / 20 / 2018 Last Medication intake:  Today   Pharmacokinetics: Liberation and absorption (onset of action): WNL Distribution (time to peak effect): WNL Metabolism and excretion (duration of action): WNL         Pharmacodynamics: Desired effects: Analgesia: Willie Krueger reports >50% benefit. Functional ability: Patient reports that medication allows him to accomplish basic ADLs Clinically meaningful improvement in function (CMIF): Sustained CMIF goals met Perceived effectiveness: Described as relatively effective, allowing for increase in activities of daily living (ADL) Undesirable effects: Side-effects or Adverse reactions: None reported Monitoring: [place] PMP: Online review of the past 30-monthperiod conducted. Compliant with practice rules and regulations List of all UDS test(s) done:  Lab Results  Component Value Date   TOXASSSELUR FINAL 12/08/2016   [place]AL 05/28/2016   [place] 02/27/2016   [place]AL 12/03/2015   TOXASSSELUR FINAL 10/31/2015   Last UDS on record: ToxAssure Select 13  Date Value Ref Range Status  12/08/2016 FINAL  Final    Comment:     ==================================================================== TOXASSURE SELECT 13 (MW) ==================================================================== Test                             Result       Flag       Units Drug Present and Declared for Prescription Verification   Oxycodone                      2419         EXPECTED   ng/mg creat   Oxymorphone                    2173         EXPECTED   ng/mg creat   Noroxycodone                   3938         EXPECTED   ng/mg creat   Noroxymorphone                 815          EXPECTED   ng/mg creat    Sources of oxycodone are scheduled prescription medications.    Oxymorphone, noroxycodone, and noroxymorphone are expected    metabolites of oxycodone. Oxymorphone is also available as a    scheduled prescription medication. Drug Present not Declared for Prescription Verification   Alcohol, Ethyl                 0.253        UNEXPECTED g/dL    Sources of ethyl alcohol include alcoholic beverages or as a    fermentation product of glucose; glucose is present  in this    specimen.  Interpret result with caution, as the presence of    ethyl alcohol is likely due, at least in part, to fermentation of    glucose. ==================================================================== Test                      Result    Flag   Units      Ref Range   Creatinine              26               mg/dL      >=20 ==================================================================== Declared Medications:  The flagging and interpretation on this report are based on the  following declared medications.  Unexpected results may arise from  inaccuracies in the declared medications.  **Note: The testing scope of this panel includes these medications:  Oxycodone  **Note: The testing scope of this panel does not include following  reported medications:  Acetaminophen  Bisacodyl  Diphenhydramine (Benadryl)  Docusate (Colace)  Furosemide (Lasix)  Gabapentin  (Neurontin)  Glimepiride (Amaryl)  Lisinopril  Metformin  Prednisone  Rivaroxaban  Sildenafil (Viagra)  Vitamin D3 ==================================================================== For clinical consultation, please call [phone]. ====================================================================    UDS interpretation: Compliant          Medication Assessment Form: Reviewed. Patient indicates being compliant with therapy Treatment compliance: Compliant Risk Assessment Profile: Aberrant behavior: See prior evaluations. None observed or detected today Comorbid factors increasing risk of overdose: See prior notes. No additional risks detected today Risk of substance use disorder (SUD): Low Opioid Risk Tool (ORT) Total Score: 0  Interpretation Table:  Score <3 = Low Risk for SUD  Score between 4-7 = Moderate Risk for SUD  Score >8 = High Risk for Opioid Abuse   Risk Mitigation Strategies:  Patient Counseling: Covered Patient-Prescriber Agreement (PPA): Present and active  Notification to other healthcare providers: Done  Pharmacologic Plan: No change in therapy, at this time  Laboratory Chemistry  Inflammation Markers Lab Results  Component Value Date   ESRSEDRATE 10 11/21/2016   CRP 28.9 (H) 10/13/2016   Renal Function Lab Results  Component Value Date   BUN 21 (H) 11/22/2016   CREATININE 1.13 11/22/2016   GFRAA >60 11/22/2016   GFRNONAA >60 11/22/2016   Hepatic Function Lab Results  Component Value Date   AST 22 11/21/2016   ALT 11 (L) 11/21/2016   ALBUMIN 3.9 11/21/2016   Electrolytes Lab Results  Component Value Date   NA 133 (L) 11/22/2016   K 4.4 11/22/2016   CL 103 11/22/2016   CALCIUM 8.8 (L) 11/22/2016   MG 1.9 02/27/2016   Pain Modulating Vitamins No results found for: Marveen Reeks, [id number], [id number], 25OHVITD1, 25OHVITD2, 25OHVITD3, VITAMINB12 Coagulation Parameters Lab Results  Component Value Date   PLT 193 11/22/2016    Cardiovascular Lab Results  Component Value Date   BNP 86.0 11/21/2016   HGB 14.3 11/22/2016   HCT 41.7 11/22/2016   Note: Lab results reviewed.  Recent Diagnostic Imaging Review  Ct Angio Chest Pe W And/or Wo Contrast Result Date: 11/21/2016 CLINICAL DATA:  Pt very poor historian. Per note in pt chart "Pt reports left arm swelling since yesterday, reports he has a known DVT in left arm and has a PICC line in left arm. Pt reports generalized rash to entire body". Pt also with increased shortness of breath per MD EXAM: CT ANGIOGRAPHY CHEST WITH CONTRAST TECHNIQUE: Multidetector CT imaging of the  chest was performed using the standard protocol during bolus administration of intravenous contrast. Multiplanar CT image reconstructions and MIPs were obtained to evaluate the vascular anatomy. CONTRAST:  75 cc Isovue 370 COMPARISON:  None. FINDINGS: Cardiovascular: Evaluation of the peripheral segmental and subsegmental pulmonary arteries is limited by fairly prominent patient breathing motion artifact but there is no convincing pulmonary embolism identified within the main, lobar or central segmental pulmonary arteries bilaterally. Thoracic aorta is normal in caliber. No aortic aneurysm or dissection. Scattered atherosclerotic changes noted within the aortic arch. Heart size is upper normal. No pericardial effusion. Scattered coronary artery calcifications noted. Mediastinum/Nodes: Scattered small lymph nodes within the mediastinum. No enlarged lymph nodes seen within the mediastinum or perihilar regions. Mildly prominent lymph nodes noted within the axillary regions bilaterally, left slightly greater than right. Esophagus appears normal. Heterogeneous enlargement of the left thyroid lobe. Lungs/Pleura: Lungs are clear. No evidence of pneumonia. No pleural effusion or pneumothorax. Upper Abdomen: Limited images of the upper abdomen are unremarkable. Musculoskeletal: Mild degenerative spurring within the  thoracic spine. No acute or suspicious osseous finding. Left-sided PICC line partially imaged. Review of the MIP images confirms the above findings. IMPRESSION: 1. Patient breathing motion artifact limits characterization of the peripheral pulmonary artery branches. There is no central obstructing pulmonary embolism identified. Due to the breathing motion artifact, I cannot exclude small peripheral pulmonary embolism. 2. Lungs are clear.  No evidence of pneumonia or pulmonary edema. 3. No aortic aneurysm or dissection. 4. Heart size is normal.  No pericardial effusion. 5. Aortic atherosclerosis. 6. **An incidental finding of potential clinical significance has been found. Heterogeneous enlargement of the left thyroid lobe. Recommend nonemergent thyroid ultrasound evaluation.** Electronically Signed   By: Franki Cabot M.D.   On: 11/21/2016 15:59   Note: Imaging results reviewed.          Meds  The patient has a current medication list which includes the following prescription(s): acetaminophen, vitamin d3, cvs bisacodyl, diphenhydramine, docusate sodium, furosemide, glimepiride, lisinopril, metformin, prednisone, sildenafil, xarelto, gabapentin, oxycodone, and oxycodone.  Current Outpatient Prescriptions on File Prior to Visit  Medication Sig  . acetaminophen (TYLENOL) 500 MG tablet Take 500 mg by mouth every 6 (six) hours as needed.  . Cholecalciferol (VITAMIN D3) 2000 units capsule Take 2,000 Units by mouth daily.   . [facility]S BISACODYL 5 MG EC tablet 5 mg daily as needed.   . diphenhydrAMINE (BENADRYL) 25 MG tablet Take 25 mg by mouth every 6 (six) hours as needed.  . docusate sodium (COLACE) 100 MG capsule Take 1 capsule (100 mg total) by mouth 2 (two) times daily.  . furosemide (LASIX) 20 MG tablet 20 mg as needed.   Marland Kitchen glimepiride (AMARYL) 2 MG tablet Take 2 mg by mouth daily with breakfast.   . lisinopril (PRINIVIL,ZESTRIL) 40 MG tablet Take 40 mg by mouth daily.  . metFORMIN (GLUCOPHAGE-XR) 500 MG  24 hr tablet Take 500 mg by mouth 2 (two) times daily.  . sildenafil (VIAGRA) 100 MG tablet Take 100 mg by mouth daily as needed for erectile dysfunction.   No current facility-administered medications on file prior to visit.    ROS  Constitutional: Denies any fever or chills Gastrointestinal: No reported hemesis, hematochezia, vomiting, or acute GI distress Musculoskeletal: Denies any acute onset joint swelling, redness, loss of ROM, or weakness Neurological: No reported episodes of acute onset apraxia, aphasia, dysarthria, agnosia, amnesia, paralysis, loss of coordination, or loss of consciousness  Allergies  Mr. Mangel is allergic to cleocin [  clindamycin]; clindamycin phosphate; other; sulfa antibiotics; and zosyn [piperacillin sod-tazobactam so].  [place]  Drug: Mr. Galentine  reports that he does not use drugs. Alcohol:  reports that he does not drink alcohol. Tobacco:  reports that he has been smoking Cigarettes.  He has been smoking about 1.50 packs per day. He has never used smokeless tobacco. Medical:  has a past medical history of Chronic pain; Chronic pain associated with significant psychosocial dysfunction (05/04/2015); Diabetes mellitus without complication ([place]); Heart murmur; History of spinal stenosis (10/31/2015); Hyperlipidemia; Hypertension; Leg weakness (07/07/2014); Mitral valve regurgitation; Nerve root inflammation (06/26/2014); and Osteoarthritis. Family: family history includes Cancer in his mother; Diabetes in his mother; Heart disease in his father.  Past Surgical History:  Procedure Laterality Date  . AMPUTATION TOE Left 10/2016   partial  . CARPAL TUNNEL RELEASE    . FOOT SURGERY Left 09/2016  . IRRIGATION AND DEBRIDEMENT FOOT Left 10/13/2016   Procedure: IRRIGATION AND DEBRIDEMENT FOOT;  Surgeon: Samara Deist, DPM;  Location: [facility] ORS;  Service: Podiatry;  Laterality: Left;  . PICC LINE PLACE PERIPHERAL ([place] HX) Left [id number]  . SPINE SURGERY     Constitutional  Exam  General appearance: Well nourished, well developed, and well hydrated. In no apparent acute distress Vitals:   02/02/17 1403  BP: (!) 149/94  Pulse: 79  Resp: 16  Temp: 98.5 F (36.9 C)  TempSrc: Oral  SpO2: 98%  Weight: 229 lb (103.9 kg)  Height: _0  (1.88 m)   BMI Assessment: Estimated body mass index is 29.4 kg/m as calculated from the following:   Height as of this encounter: _1  (1.88 m).   Weight as of this encounter: 229 lb (103.9 kg).  BMI interpretation table: BMI level Category Range association with higher incidence of chronic pain  <18 kg/m2 Underweight   18.5-24.9 kg/m2 Ideal body weight   25-29.9 kg/m2 Overweight Increased incidence by 20%  30-34.9 kg/m2 Obese (Class I) Increased incidence by 68%  35-39.9 kg/m2 Severe obesity (Class II) Increased incidence by 136%  >40 kg/m2 Extreme obesity (Class III) Increased incidence by 254%   BMI Readings from Last 4 Encounters:  02/02/17 29.40 kg/m  12/05/16 29.40 kg/m  11/21/16 28.89 kg/m  11/13/16 29.40 kg/m   Wt Readings from Last 4 Encounters:  02/02/17 229 lb (103.9 kg)  12/05/16 229 lb (103.9 kg)  11/21/16 225 lb (102.1 kg)  11/13/16 229 lb (103.9 kg)  Psych/Mental status: Alert, oriented x 3 (person, place, & time)       Eyes: PERLA Respiratory: No evidence of acute respiratory distress  Cervical Spine Exam  Inspection: No masses, redness, or swelling Alignment: Symmetrical Functional ROM: Unrestricted ROM Stability: No instability detected Muscle strength & Tone: Functionally intact Sensory: Unimpaired Palpation: Non-contributory  Upper Extremity (UE) Exam    Side: Right upper extremity  Side: Left upper extremity  Inspection: No masses, redness, swelling, or asymmetry. No contractures  Inspection: No masses, redness, swelling, or asymmetry. No contractures  Functional ROM: Unrestricted ROM          Functional ROM: Unrestricted ROM          Muscle strength & Tone: Functionally intact   Muscle strength & Tone: Functionally intact  Sensory: Unimpaired  Sensory: Unimpaired  Palpation: Euthermic  Palpation: Euthermic  Specialized Test(s): Deferred         Specialized Test(s): Deferred          Thoracic Spine Exam  Inspection: No masses, redness, or swelling Alignment: Symmetrical Functional  ROM: Unrestricted ROM Stability: No instability detected Sensory: Unimpaired Muscle strength & Tone: Functionally intact Palpation: Non-contributory  Lumbar Spine Exam  Inspection: No masses, redness, or swelling Alignment: Symmetrical Functional ROM: Unrestricted ROM Stability: No instability detected Muscle strength & Tone: Functionally intact Sensory: Unimpaired Palpation: Non-contributory Provocative Tests: Lumbar Hyperextension and rotation test: evaluation deferred today       Patrick's Maneuver: evaluation deferred today              Gait & Posture Assessment  Ambulation: Unassisted Gait: Relatively normal for age and body habitus Posture: WNL   Lower Extremity Exam    Side: Right lower extremity  Side: Left lower extremity  Inspection: No masses, redness, swelling, or asymmetry. No contractures  Inspection: No masses, redness, swelling, or asymmetry. No contractures  Functional ROM: Unrestricted ROM          Functional ROM: Unrestricted ROM          Muscle strength & Tone: Functionally intact  Muscle strength & Tone: Functionally intact  Sensory: Unimpaired  Sensory: Unimpaired  Palpation: No palpable anomalies  Palpation: No palpable anomalies   Assessment  Primary Diagnosis & Pertinent Problem List: The primary encounter diagnosis was Chronic low back pain (Location of Primary Source of Pain) (Midline pain) (R>L). Diagnoses of Chronic pain syndrome, Neurogenic pain, Chronic lower extremity pain (Location of Secondary source of pain) (Bilateral) (R>L), Chronic shoulder pain (Location of Tertiary source of pain) (Bilateral) (R>L), Long term current use of opiate  analgesic, Opiate use (112.5 MME/Day), Lumbar facet syndrome (Location of Primary Source of Pain) (Bilateral) (R>L), and Enlarged thyroid were also pertinent to this visit.  Status Diagnosis  Controlled Controlled Controlled 1. Chronic low back pain (Location of Primary Source of Pain) (Midline pain) (R>L)   2. Chronic pain syndrome   3. Neurogenic pain   4. Chronic lower extremity pain (Location of Secondary source of pain) (Bilateral) (R>L)   5. Chronic shoulder pain (Location of Tertiary source of pain) (Bilateral) (R>L)   6. Long term current use of opiate analgesic   7. Opiate use (112.5 MME/Day)   8. Lumbar facet syndrome (Location of Primary Source of Pain) (Bilateral) (R>L)   9. Enlarged thyroid      Plan of Care  Pharmacotherapy (Medications Ordered): Meds ordered this encounter  Medications  . oxyCODONE (ROXICODONE) 15 MG immediate release tablet    Sig: Take 1 tablet (15 mg total) by mouth 5 (five) times daily as needed for pain.    Dispense:  150 tablet    Refill:  0    Do not place this medication, or any other prescription from our practice, on "Automatic Refill". Patient may have prescription filled one day early if pharmacy is closed on scheduled refill date. Do not fill until: 02/13/17 To last until: 03/15/17  . oxyCODONE (ROXICODONE) 15 MG immediate release tablet    Sig: Take 1 tablet (15 mg total) by mouth 5 (five) times daily as needed for pain.    Dispense:  150 tablet    Refill:  0    Do not place this medication, or any other prescription from our practice, on "Automatic Refill". Patient may have prescription filled one day early if pharmacy is closed on scheduled refill date. Do not fill until: 03/15/17 To last until: 04/14/17  . gabapentin (NEURONTIN) 600 MG tablet    Sig: Take 2 tablets (1,200 mg total) by mouth every 6 (six) hours.    Dispense:  480 tablet  Refill:  0    Do not place this medication, or any other prescription from our practice, on  "Automatic Refill". Patient may have prescription filled one day early if pharmacy is closed on scheduled refill date.   New Prescriptions   No medications on file   Medications administered today: Mr. Vollmer had no medications administered during this visit. Lab-work, procedure(s), and/or referral(s): Orders Placed This Encounter  Procedures  . US THYROID  . Thyroid Panel With TSH   Imaging and/or referral(s): None  Interventional therapies: Planned, scheduled, and/or pending:   Right Lumbar facet RFA. Stop Xarelto x 3 days   Considering:   Right Lumbar facet RFA.   Palliative PRN treatment(s):   Right Lumbar facet RFA.   Provider-requested follow-up: No Follow-up on file.  Future Appointments Date Time Provider [place]  02/09/2017 10:45 AM Milinda Pointer, MD [facility] None  03/16/2017 1:45 PM Milinda Pointer, MD [facility] None  04/02/2017 11:15 AM Milinda Pointer, MD [facility] None   Primary Care Physician: [facility] Location: [facility] Facility Note by: Kamylle Axelson A. Dossie Arbour, M.D, DABA, DABAPM, DABPM, DABIPP, FIPP Date: 02/02/2017; Time: 3:56 PM  Pain Score Disclaimer: We use the NRS-11 scale. This is a self-reported, subjective measurement of pain severity with only modest accuracy. It is used primarily to identify changes within a particular patient. It must be understood that outpatient pain scales are significantly less accurate that those used for research, where they can be applied under ideal controlled circumstances with minimal exposure to variables. In reality, the score is likely to be a combination of pain intensity and pain affect, where pain affect describes the degree of emotional arousal or changes in action readiness caused by the sensory experience of pain. Factors such as social and work situation, setting, emotional state, anxiety levels, expectation, and prior pain experience may influence pain perception and  show large inter-individual differences that may also be affected by time variables.  Patient instructions provided during this appointment: Patient Instructions  Radiofrequency Lesioning Introduction Radiofrequency lesioning is a procedure that is performed to relieve pain. The procedure is often used for back, neck, or arm pain. Radiofrequency lesioning involves the use of a machine that creates radio waves to make heat. During the procedure, the heat is applied to the nerve that carries the pain signal. The heat damages the nerve and interferes with the pain signal. Pain relief usually starts about 2 weeks after the procedure and lasts for 6 months to 1 year. Tell a health care provider about:  Any allergies you have.  All medicines you are taking, including vitamins, herbs, eye drops, creams, and over-the-counter medicines.  Any problems you or family members have had with anesthetic medicines.  Any blood disorders you have.  Any surgeries you have had.  Any medical conditions you have.  Whether you are pregnant or may be pregnant. What are the risks? Generally, this is a safe procedure. However, problems may occur, including:  Pain or soreness at the injection site.  Infection at the injection site.  Damage to nerves or blood vessels. What happens before the procedure?  Ask your health care provider about:  Changing or stopping your regular medicines. This is especially important if you are taking diabetes medicines or blood thinners.  Taking medicines such as aspirin and ibuprofen. These medicines can thin your blood. Do not take these medicines before your procedure if your health care provider instructs you not to.  Follow instructions from your health care  provider about eating or drinking restrictions.  Plan to have someone take you home after the procedure.  If you go home right after the procedure, plan to have someone with you for 24 hours. What happens during  the procedure?  You will be given one or more of the following:  A medicine to help you relax (sedative).  A medicine to numb the area (local anesthetic).  You will be awake during the procedure. You will need to be able to talk with the health care provider during the procedure.  With the help of a type of X-ray (fluoroscopy), the health care provider will insert a radiofrequency needle into the area to be treated.  Next, a wire that carries the radio waves (electrode) will be put through the radiofrequency needle. An electrical pulse will be sent through the electrode to verify the correct nerve. You will feel a tingling sensation, and you may have muscle twitching.  Then, the tissue that is around the needle tip will be heated by an electric current that is passed using the radiofrequency machine. This will numb the nerves.  A bandage (dressing) will be put on the insertion area after the procedure is done. The procedure may vary among health care providers and hospitals. What happens after the procedure?  Your blood pressure, heart rate, breathing rate, and blood oxygen level will be monitored often until the medicines you were given have worn off.  Return to your normal activities as directed by your health care provider. This information is not intended to replace advice given to you by your health care provider. Make sure you discuss any questions you have with your health care provider. Document Released: 08/13/2011 Document Revised: 05/22/2016 Document Reviewed: 01/22/2015  2017 [employer] Radiofrequency Lesioning, Care After Introduction Refer to this sheet in the next few weeks. These instructions provide you with information about caring for yourself after your procedure. Your health care provider may also give you more specific instructions. Your treatment has been planned according to current medical practices, but problems sometimes occur. Call your health care provider if  you have any problems or questions after your procedure. What can I expect after the procedure? After the procedure, it is common to have:  Pain from the burned nerve.  Temporary numbness. Follow these instructions at home:  Take over-the-counter and prescription medicines only as told by your health care provider.  Return to your normal activities as told by your health care provider. Ask your health care provider what activities are safe for you.  Pay close attention to how you feel after the procedure. If you start to have pain, write down when it hurts and how it feels. This will help you and your health care provider to know if you need an additional treatment.  Check your needle insertion site every day for signs of infection. Watch for:  Redness, swelling, or pain.  Fluid, blood, or pus.  Keep all follow-up visits as told by your health care provider. This is important. Contact a health care provider if:  Your pain does not get better.  You have redness, swelling, or pain at the needle insertion site.  You have fluid, blood, or pus coming from the needle insertion site.  You have a fever. Get help right away if:  You develop sudden, severe pain.  You develop numbness or tingling near the procedure site that does not go away. This information is not intended to replace advice given to you by your health  care provider. Make sure you discuss any questions you have with your health care provider. Document Released: 08/14/2011 Document Revised: 05/22/2016 Document Reviewed: 01/22/2015  2017 [employer] Facet Joint Block, Care After Refer to this sheet in the next few weeks. These instructions provide you with information on caring for yourself after your procedure. Your health care provider may also give you more specific instructions. Your treatment has been planned according to current medical practices, but problems sometimes occur. Call your health care provider if you  have any problems or questions after your procedure. HOME CARE INSTRUCTIONS   Keep track of the amount of pain relief you feel and how long it lasts.  Limit pain medicine within the first 4-6 hours after the procedure as directed by your health care provider.  Resume taking dietary supplements and medicines as directed by your health care provider.  You may resume your regular diet.  Do not apply heat near or over the injection site(s) for 24 hours.   Do not take a bath or soak in water (such as a pool or lake) for 24 hours.  Do not drive for 24 hours unless approved by your health care provider.  Avoid strenuous activity for 24 hours.  Remove your bandages the morning after the procedure.   If the injection site is tender, applying an ice pack may relieve some tenderness. To do this:  Put ice in a bag.  Place a towel between your skin and the bag.  Leave the ice on for 15-20 minutes, 3-4 times a day.  Keep follow-up appointments as directed by your health care provider. SEEK MEDICAL CARE IF:   Your pain is not controlled by your medicines.   There is drainage from the injection site.   There is significant bleeding or swelling at the injection site.  You have diabetes and your blood sugar is above 180 mg/dL. SEEK IMMEDIATE MEDICAL CARE IF:   You develop a fever of 101F (38.3C) or greater.   You have worsening pain or swelling around the injection site.   You have red streaking around the injection site.   You develop severe pain that is not controlled by your medicines.   You develop a headache, stiff neck, nausea, or vomiting.   Your eyes become very sensitive to light.   You have weakness, paralysis, or tingling in your arms or legs that was not present before the procedure.   You develop difficulty urinating or breathing.  This information is not intended to replace advice given to you by your health care provider. Make sure you discuss any  questions you have with your health care provider. Document Released: 12/01/2012 Document Revised: 01/05/2015 Document Reviewed: 09/10/2015 Elsevier Interactive Patient Education  2017 [employer] Facet Joint Block The facet joints connect the bones of the spine (vertebrae). They make it possible for you to bend, twist, and make other movements with your spine. They also prevent you from overbending, overtwisting, and making other excessive movements.  A facet joint block is a procedure where a numbing medicine (anesthetic) is injected into a facet joint. Often, a type of anti-inflammatory medicine called a steroid is also injected. A facet joint block may be done for two reasons:   Diagnosis. A facet joint block may be done as a test to see whether neck or back pain is caused by a worn-down or infected facet joint. If the pain gets better after a facet joint block, it means the pain is probably coming from  the facet joint. If the pain does not get better, it means the pain is probably not coming from the facet joint.   Therapy. A facet joint block may be done to relieve neck or back pain caused by a facet joint. A facet joint block is only done as a therapy if the pain does not improve with medicine, exercise programs, physical therapy, and other forms of pain management. LET [facility] CARE PROVIDER KNOW ABOUT:   Any allergies you have.   All medicines you are taking, including vitamins, herbs, eyedrops, and over-the-counter medicines and creams.   Previous problems you or members of your family have had with the use of anesthetics.   Any blood disorders you have had.   Other health problems you have. RISKS AND COMPLICATIONS Generally, having a facet joint block is safe. However, as with any procedure, complications can occur. Possible complications associated with having a facet joint block include:   Bleeding.   Injury to a nerve near the injection site.   Pain at the  injection site.   Weakness or numbness in areas controlled by nerves near the injection site.   Infection.   Temporary fluid retention.   Allergic reaction to anesthetics or medicines used during the procedure. BEFORE THE PROCEDURE   Follow your health care provider's instructions if you are taking dietary supplements or medicines. You may need to stop taking them or reduce your dosage.   Do not take any new dietary supplements or medicines without asking your health care provider first.   Follow your health care provider's instructions about eating and drinking before the procedure. You may need to stop eating and drinking several hours before the procedure.   Arrange to have an adult drive you home after the procedure. PROCEDURE  You may need to remove your clothing and dress in an open-back gown so that your health care provider can access your spine.   The procedure will be done while you are lying on an X-ray table. Most of the time you will be asked to lie on your stomach, but you may be asked to lie in a different position if an injection will be made in your neck.   Special machines will be used to monitor your oxygen levels, heart rate, and blood pressure.   If an injection will be made in your neck, an intravenous (IV) tube will be inserted into one of your veins. Fluids and medicine will flow directly into your body through the IV tube.   The area over the facet joint where the injection will be made will be cleaned with an antiseptic soap. The surrounding skin will be covered with sterile drapes.   An anesthetic will be applied to your skin to make the injection area numb. You may feel a temporary stinging or burning sensation.   A video X-ray machine will be used to locate the joint. A contrast dye may be injected into the facet joint area to help with locating the joint.   When the joint is located, an anesthetic medicine will be injected into the joint  through the needle.   Your health care provider will ask you whether you feel pain relief. If you do feel relief, a steroid may be injected to provide pain relief for a longer period of time. If you do not feel relief or feel only partial relief, additional injections of an anesthetic may be made in other facet joints.   The needle will be removed,  the skin will be cleansed, and bandages will be applied.  AFTER THE PROCEDURE   You will be observed for 15-30 minutes before being allowed to go home. Do not drive. Have an adult drive you or take a taxi or public transportation instead.   If you feel pain relief, the pain will return in several hours or days when the anesthetic wears off.   You may feel pain relief 2-14 days after the procedure. The amount of time this relief lasts varies from person to person.   It is normal to feel some tenderness over the injected area(s) for 2 days following the procedure.   If you have diabetes, you may have a temporary increase in blood sugar. This information is not intended to replace advice given to you by your health care provider. Make sure you discuss any questions you have with your health care provider. Document Released: 05/06/2007 Document Revised: 01/05/2015 Document Reviewed: 09/10/2015 Elsevier Interactive Patient Education  2017 [employer].

## 2017-02-02 NOTE — Progress Notes (Signed)
Nursing Pain Medication Assessment:  Safety precautions to be maintained throughout the outpatient stay will include: orient to surroundings, keep bed in low position, maintain call bell within reach at all times, provide assistance with transfer out of bed and ambulation.  Medication Inspection Compliance: Pill count conducted under aseptic conditions, in front of the patient. Neither the pills nor the bottle was removed from the patient's sight at any time. Once count was completed pills were immediately returned to the patient in their original bottle.  Medication: Oxycodone IR Pill/Patch Count: 84 of 150 pills remain Bottle Appearance: Standard pharmacy container. Clearly labeled. Filled Date: 01 / 20 / 2018 Last Medication intake:  Today

## 2017-02-02 NOTE — Patient Instructions (Addendum)
Radiofrequency Lesioning Introduction Radiofrequency lesioning is a procedure that is performed to relieve pain. The procedure is often used for back, neck, or arm pain. Radiofrequency lesioning involves the use of a machine that creates radio waves to make heat. During the procedure, the heat is applied to the nerve that carries the pain signal. The heat damages the nerve and interferes with the pain signal. Pain relief usually starts about 2 weeks after the procedure and lasts for 6 months to 1 year. Tell a health care provider about:  Any allergies you have.  All medicines you are taking, including vitamins, herbs, eye drops, creams, and over-the-counter medicines.  Any problems you or family members have had with anesthetic medicines.  Any blood disorders you have.  Any surgeries you have had.  Any medical conditions you have.  Whether you are pregnant or may be pregnant. What are the risks? Generally, this is a safe procedure. However, problems may occur, including:  Pain or soreness at the injection site.  Infection at the injection site.  Damage to nerves or blood vessels. What happens before the procedure?  Ask your health care provider about:  Changing or stopping your regular medicines. This is especially important if you are taking diabetes medicines or blood thinners.  Taking medicines such as aspirin and ibuprofen. These medicines can thin your blood. Do not take these medicines before your procedure if your health care provider instructs you not to.  Follow instructions from your health care provider about eating or drinking restrictions.  Plan to have someone take you home after the procedure.  If you go home right after the procedure, plan to have someone with you for 24 hours. What happens during the procedure?  You will be given one or more of the following:  A medicine to help you relax (sedative).  A medicine to numb the area (local anesthetic).  You  will be awake during the procedure. You will need to be able to talk with the health care provider during the procedure.  With the help of a type of X-ray (fluoroscopy), the health care provider will insert a radiofrequency needle into the area to be treated.  Next, a wire that carries the radio waves (electrode) will be put through the radiofrequency needle. An electrical pulse will be sent through the electrode to verify the correct nerve. You will feel a tingling sensation, and you may have muscle twitching.  Then, the tissue that is around the needle tip will be heated by an electric current that is passed using the radiofrequency machine. This will numb the nerves.  A bandage (dressing) will be put on the insertion area after the procedure is done. The procedure may vary among health care providers and hospitals. What happens after the procedure?  Your blood pressure, heart rate, breathing rate, and blood oxygen level will be monitored often until the medicines you were given have worn off.  Return to your normal activities as directed by your health care provider. This information is not intended to replace advice given to you by your health care provider. Make sure you discuss any questions you have with your health care provider. Document Released: 08/13/2011 Document Revised: 05/22/2016 Document Reviewed: 01/22/2015  2017 [employer] Radiofrequency Lesioning, Care After Introduction Refer to this sheet in the next few weeks. These instructions provide you with information about caring for yourself after your procedure. Your health care provider may also give you more specific instructions. Your treatment has been planned according to  current medical practices, but problems sometimes occur. Call your health care provider if you have any problems or questions after your procedure. What can I expect after the procedure? After the procedure, it is common to have:  Pain from the burned  nerve.  Temporary numbness. Follow these instructions at home:  Take over-the-counter and prescription medicines only as told by your health care provider.  Return to your normal activities as told by your health care provider. Ask your health care provider what activities are safe for you.  Pay close attention to how you feel after the procedure. If you start to have pain, write down when it hurts and how it feels. This will help you and your health care provider to know if you need an additional treatment.  Check your needle insertion site every day for signs of infection. Watch for:  Redness, swelling, or pain.  Fluid, blood, or pus.  Keep all follow-up visits as told by your health care provider. This is important. Contact a health care provider if:  Your pain does not get better.  You have redness, swelling, or pain at the needle insertion site.  You have fluid, blood, or pus coming from the needle insertion site.  You have a fever. Get help right away if:  You develop sudden, severe pain.  You develop numbness or tingling near the procedure site that does not go away. This information is not intended to replace advice given to you by your health care provider. Make sure you discuss any questions you have with your health care provider. Document Released: 08/14/2011 Document Revised: 05/22/2016 Document Reviewed: 01/22/2015  2017 [employer] Facet Joint Block, Care After Refer to this sheet in the next few weeks. These instructions provide you with information on caring for yourself after your procedure. Your health care provider may also give you more specific instructions. Your treatment has been planned according to current medical practices, but problems sometimes occur. Call your health care provider if you have any problems or questions after your procedure. HOME CARE INSTRUCTIONS   Keep track of the amount of pain relief you feel and how long it lasts.  Limit pain  medicine within the first 4-6 hours after the procedure as directed by your health care provider.  Resume taking dietary supplements and medicines as directed by your health care provider.  You may resume your regular diet.  Do not apply heat near or over the injection site(s) for 24 hours.   Do not take a bath or soak in water (such as a pool or lake) for 24 hours.  Do not drive for 24 hours unless approved by your health care provider.  Avoid strenuous activity for 24 hours.  Remove your bandages the morning after the procedure.   If the injection site is tender, applying an ice pack may relieve some tenderness. To do this:  Put ice in a bag.  Place a towel between your skin and the bag.  Leave the ice on for 15-20 minutes, 3-4 times a day.  Keep follow-up appointments as directed by your health care provider. SEEK MEDICAL CARE IF:   Your pain is not controlled by your medicines.   There is drainage from the injection site.   There is significant bleeding or swelling at the injection site.  You have diabetes and your blood sugar is above 180 mg/dL. SEEK IMMEDIATE MEDICAL CARE IF:   You develop a fever of 101F (38.3C) or greater.   You have worsening  pain or swelling around the injection site.   You have red streaking around the injection site.   You develop severe pain that is not controlled by your medicines.   You develop a headache, stiff neck, nausea, or vomiting.   Your eyes become very sensitive to light.   You have weakness, paralysis, or tingling in your arms or legs that was not present before the procedure.   You develop difficulty urinating or breathing.  This information is not intended to replace advice given to you by your health care provider. Make sure you discuss any questions you have with your health care provider. Document Released: 12/01/2012 Document Revised: 01/05/2015 Document Reviewed: 09/10/2015 Elsevier Interactive  Patient Education  2017 [employer] Facet Joint Block The facet joints connect the bones of the spine (vertebrae). They make it possible for you to bend, twist, and make other movements with your spine. They also prevent you from overbending, overtwisting, and making other excessive movements.  A facet joint block is a procedure where a numbing medicine (anesthetic) is injected into a facet joint. Often, a type of anti-inflammatory medicine called a steroid is also injected. A facet joint block may be done for two reasons:   Diagnosis. A facet joint block may be done as a test to see whether neck or back pain is caused by a worn-down or infected facet joint. If the pain gets better after a facet joint block, it means the pain is probably coming from the facet joint. If the pain does not get better, it means the pain is probably not coming from the facet joint.   Therapy. A facet joint block may be done to relieve neck or back pain caused by a facet joint. A facet joint block is only done as a therapy if the pain does not improve with medicine, exercise programs, physical therapy, and other forms of pain management. LET [facility] CARE PROVIDER KNOW ABOUT:   Any allergies you have.   All medicines you are taking, including vitamins, herbs, eyedrops, and over-the-counter medicines and creams.   Previous problems you or members of your family have had with the use of anesthetics.   Any blood disorders you have had.   Other health problems you have. RISKS AND COMPLICATIONS Generally, having a facet joint block is safe. However, as with any procedure, complications can occur. Possible complications associated with having a facet joint block include:   Bleeding.   Injury to a nerve near the injection site.   Pain at the injection site.   Weakness or numbness in areas controlled by nerves near the injection site.   Infection.   Temporary fluid retention.   Allergic reaction  to anesthetics or medicines used during the procedure. BEFORE THE PROCEDURE   Follow your health care provider's instructions if you are taking dietary supplements or medicines. You may need to stop taking them or reduce your dosage.   Do not take any new dietary supplements or medicines without asking your health care provider first.   Follow your health care provider's instructions about eating and drinking before the procedure. You may need to stop eating and drinking several hours before the procedure.   Arrange to have an adult drive you home after the procedure. PROCEDURE  You may need to remove your clothing and dress in an open-back gown so that your health care provider can access your spine.   The procedure will be done while you are lying on an X-ray table.  Most of the time you will be asked to lie on your stomach, but you may be asked to lie in a different position if an injection will be made in your neck.   Special machines will be used to monitor your oxygen levels, heart rate, and blood pressure.   If an injection will be made in your neck, an intravenous (IV) tube will be inserted into one of your veins. Fluids and medicine will flow directly into your body through the IV tube.   The area over the facet joint where the injection will be made will be cleaned with an antiseptic soap. The surrounding skin will be covered with sterile drapes.   An anesthetic will be applied to your skin to make the injection area numb. You may feel a temporary stinging or burning sensation.   A video X-ray machine will be used to locate the joint. A contrast dye may be injected into the facet joint area to help with locating the joint.   When the joint is located, an anesthetic medicine will be injected into the joint through the needle.   Your health care provider will ask you whether you feel pain relief. If you do feel relief, a steroid may be injected to provide pain relief for  a longer period of time. If you do not feel relief or feel only partial relief, additional injections of an anesthetic may be made in other facet joints.   The needle will be removed, the skin will be cleansed, and bandages will be applied.  AFTER THE PROCEDURE   You will be observed for 15-30 minutes before being allowed to go home. Do not drive. Have an adult drive you or take a taxi or public transportation instead.   If you feel pain relief, the pain will return in several hours or days when the anesthetic wears off.   You may feel pain relief 2-14 days after the procedure. The amount of time this relief lasts varies from person to person.   It is normal to feel some tenderness over the injected area(s) for 2 days following the procedure.   If you have diabetes, you may have a temporary increase in blood sugar. This information is not intended to replace advice given to you by your health care provider. Make sure you discuss any questions you have with your health care provider. Document Released: 05/06/2007 Document Revised: 01/05/2015 Document Reviewed: 09/10/2015 Elsevier Interactive Patient Education  2017 [employer].

## 2017-02-03 LAB — THYROID PANEL WITH TSH
FREE THYROXINE INDEX: 2.1 (ref 1.2–4.9)
T3 Uptake Ratio: 30 % (ref 24–39)
T4 TOTAL: 6.9 ug/dL (ref 4.5–12.0)
TSH: 0.355 u[IU]/mL — AB (ref 0.450–4.500)

## 2017-02-05 ENCOUNTER — Encounter: Payer: Medicare Other | Admitting: Pain Medicine

## 2017-02-06 IMAGING — CT CT ANGIO CHEST
2 of 6 series · 18 of 46 positions shown · IV contrast (APPLIED)
Comparison: None.

CLINICAL DATA: Pt very poor historian. Per note in pt chart "Pt
reports left arm swelling since yesterday, reports he has a known
DVT in left arm and has a PICC line in left arm. Pt reports
generalized rash to entire body". Pt also with increased shortness

EXAM:
CT ANGIOGRAPHY CHEST WITH CONTRAST
TECHNIQUE: Multidetector CT imaging of the chest was performed using the
standard protocol during bolus administration of intravenous
contrast. Multiplanar CT image reconstructions and MIPs were
obtained to evaluate the vascular anatomy.
CONTRAST:  75 cc Isovue 370

[Series 5: thins · axial · 0.76mm/px · z∈[-617,-338]mm · 16 of 307 slices shown]
[im 14/307  lung]
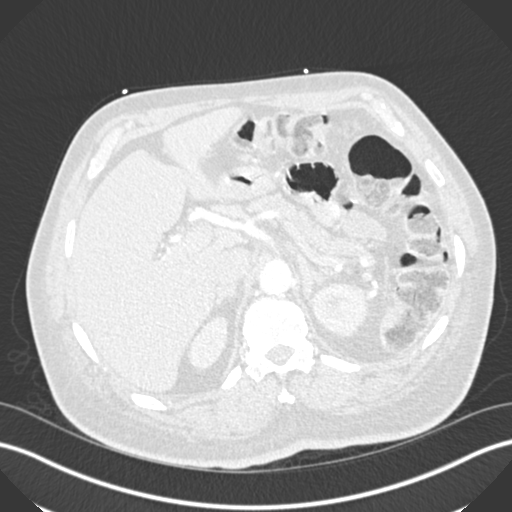
[im 40/307  soft-tissue]
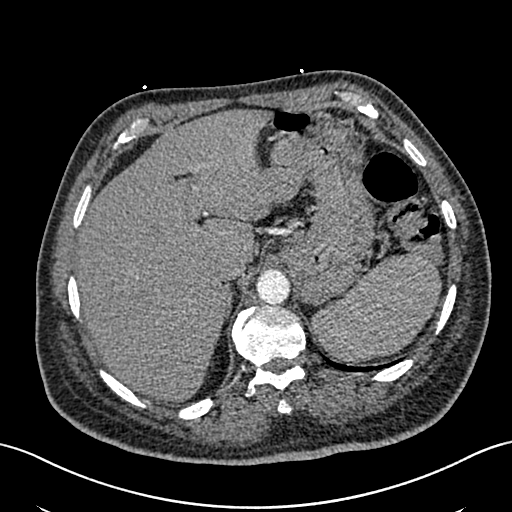
[im 54/307  lung]
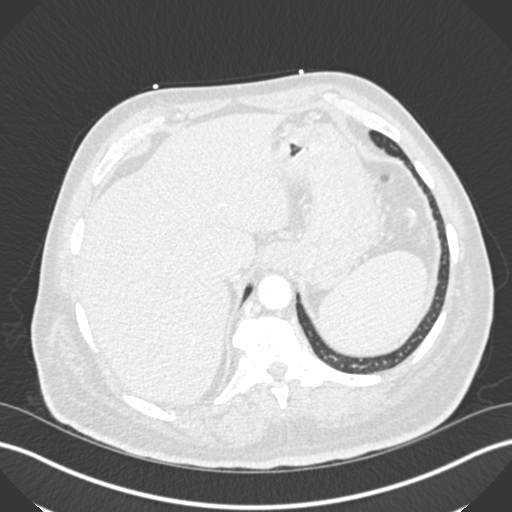
[im 67/307  soft-tissue]
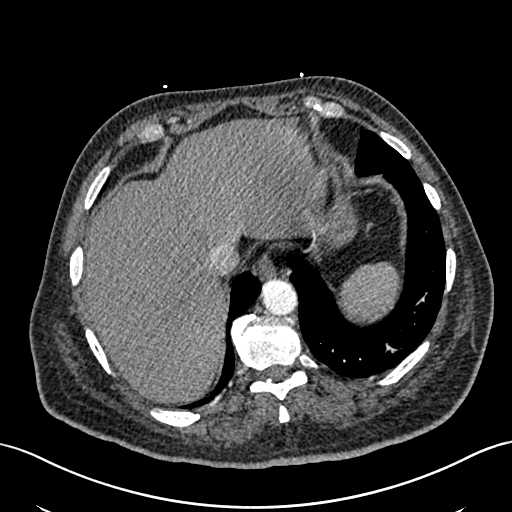
[im 94/307  lung]
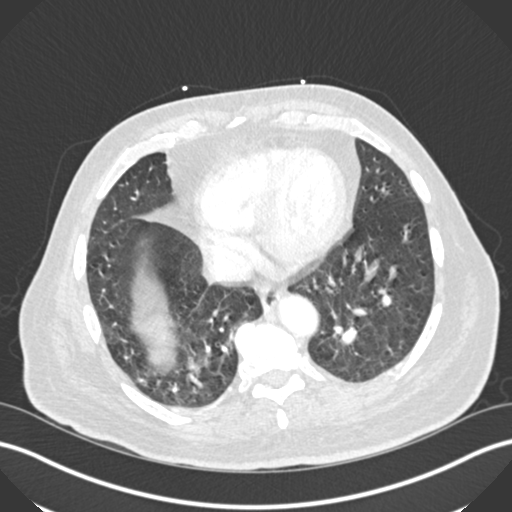
[im 107/307  soft-tissue]
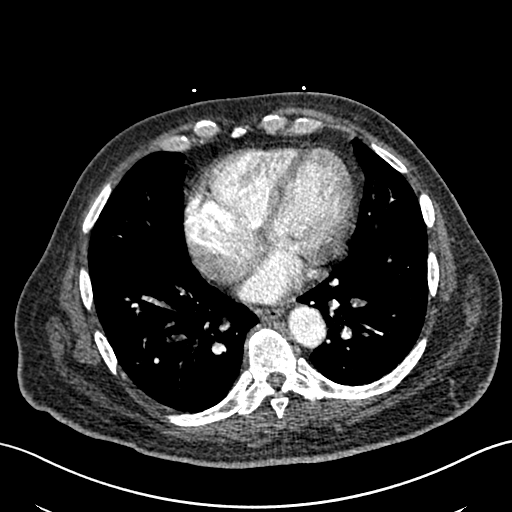
[im 120/307  lung]
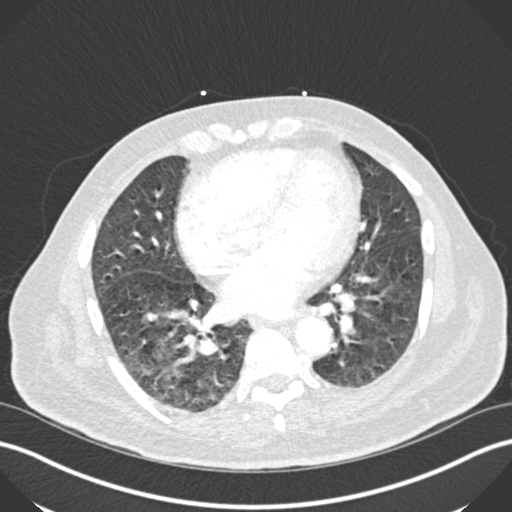
[im 147/307  soft-tissue]
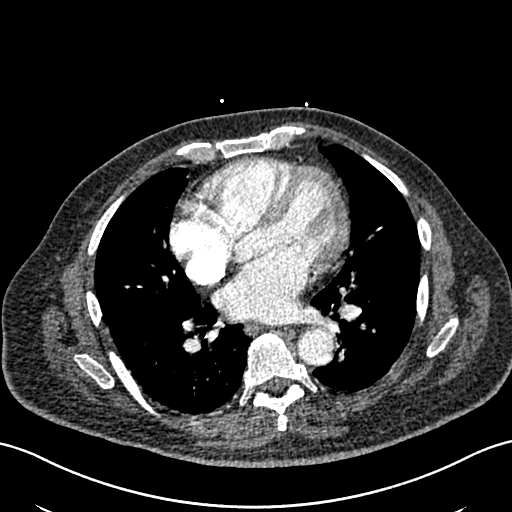
[im 160/307  lung]
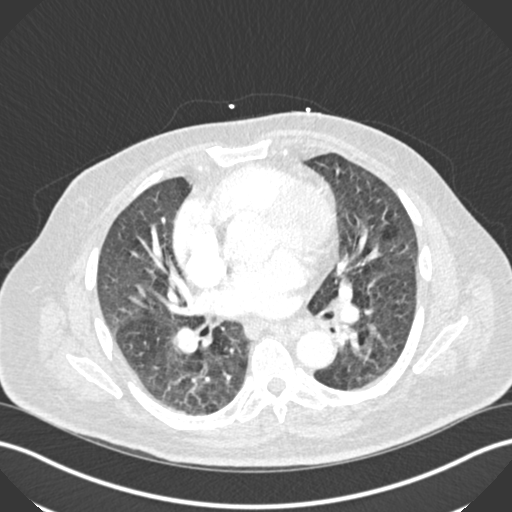
[im 187/307  soft-tissue]
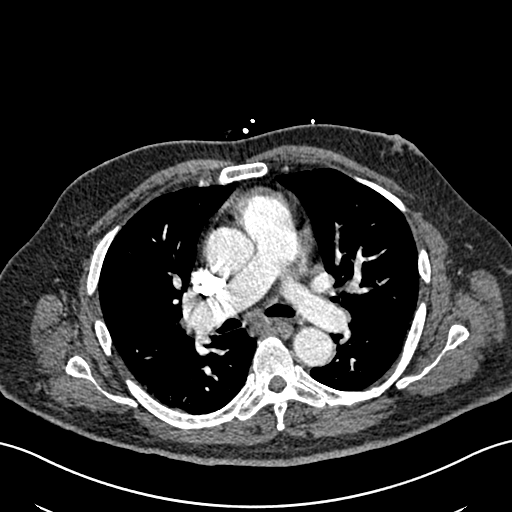
[im 200/307  lung]
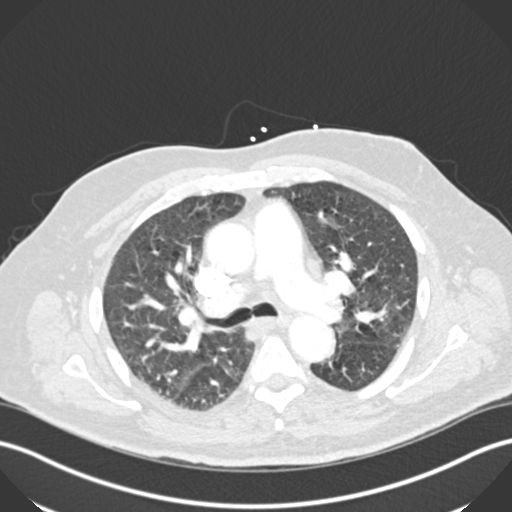
[im 213/307  soft-tissue]
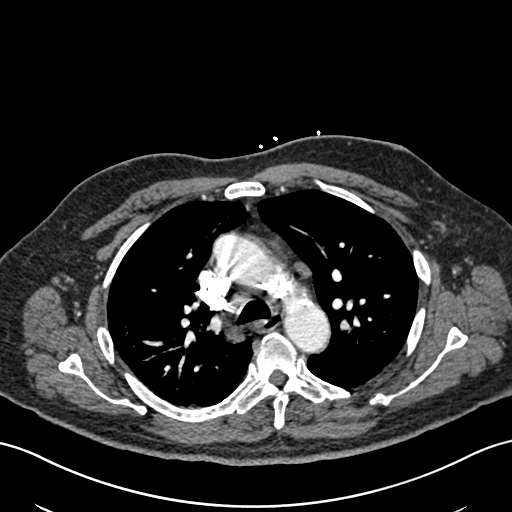
[im 240/307  lung]
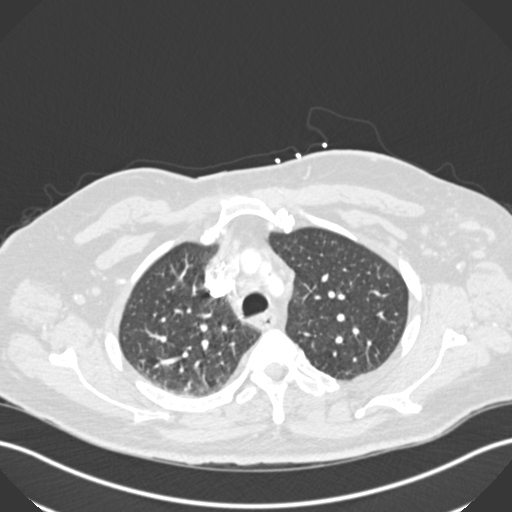
[im 253/307  soft-tissue]
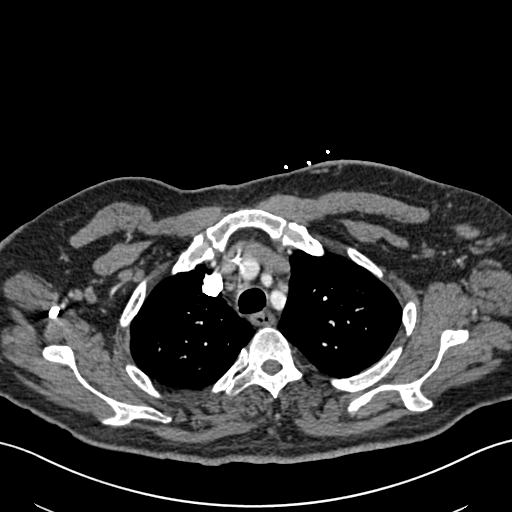
[im 267/307  lung]
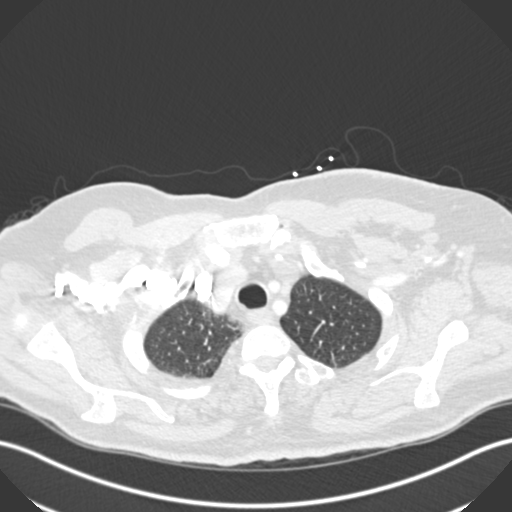
[im 293/307  soft-tissue]
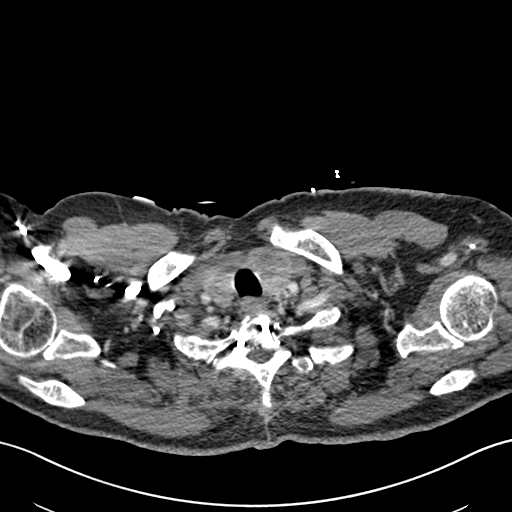

[Series 7: coronal mpr · coronal · 0.60mm/px · 2 of 95 slices shown]
[im 32/95  soft-tissue]
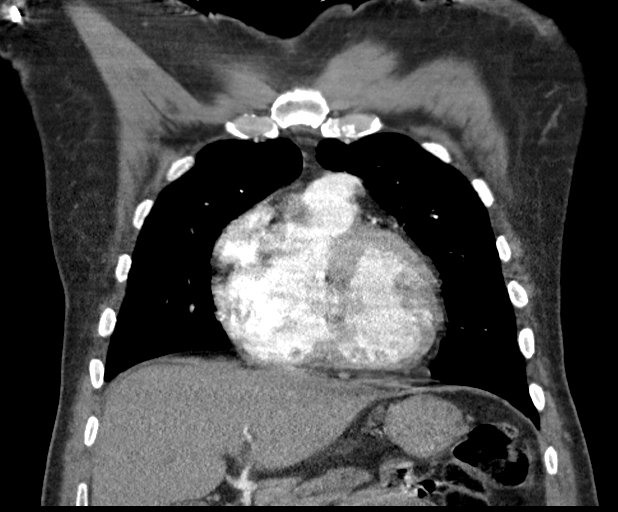
[im 63/95  soft-tissue]
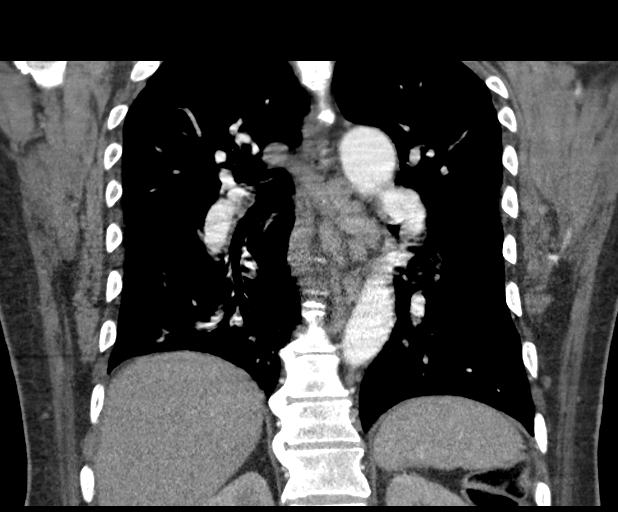

[18 of 46 positions shown; findings below may reference images not displayed]

FINDINGS: Cardiovascular: Evaluation of the peripheral segmental and
subsegmental pulmonary arteries is limited by fairly prominent
patient breathing motion artifact but there is no convincing
pulmonary embolism identified within the main, lobar or central
segmental pulmonary arteries bilaterally.

Thoracic aorta is normal in caliber. No aortic aneurysm or
dissection. Scattered atherosclerotic changes noted within the
aortic arch. Heart size is upper normal. No pericardial effusion.
Scattered coronary artery calcifications noted.

Mediastinum/Nodes: Scattered small lymph nodes within the
mediastinum. No enlarged lymph nodes seen within the mediastinum or
perihilar regions. Mildly prominent lymph nodes noted within the
axillary regions bilaterally, left slightly greater than right.
Esophagus appears normal.

Heterogeneous enlargement of the left thyroid lobe.

Lungs/Pleura: Lungs are clear. No evidence of pneumonia. No pleural
effusion or pneumothorax.

Upper Abdomen: Limited images of the upper abdomen are unremarkable.

Musculoskeletal: Mild degenerative spurring within the thoracic
spine. No acute or suspicious osseous finding. Left-sided PICC line
partially imaged.

Review of the MIP images confirms the above findings.
IMPRESSION: 1. Patient breathing motion artifact limits characterization of the
peripheral pulmonary artery branches. There is no central
obstructing pulmonary embolism identified. Due to the breathing
motion artifact, I cannot exclude small peripheral pulmonary
embolism.
2. Lungs are clear.  No evidence of pneumonia or pulmonary edema.
3. No aortic aneurysm or dissection.
4. Heart size is normal.  No pericardial effusion.
5. Aortic atherosclerosis.
6. **An incidental finding of potential clinical significance has
been found. Heterogeneous enlargement of the left thyroid lobe.
Recommend nonemergent thyroid ultrasound evaluation.**

## 2017-02-09 ENCOUNTER — Ambulatory Visit
Admission: RE | Admit: 2017-02-09 | Discharge: 2017-02-09 | Disposition: A | Discharge status: Home / Self Care | Payer: 59 | Source: Ambulatory Visit | Attending: Pain Medicine | Admitting: Pain Medicine

## 2017-02-09 ENCOUNTER — Encounter: Payer: Self-pay | Admitting: Pain Medicine

## 2017-02-09 ENCOUNTER — Ambulatory Visit (HOSPITAL_BASED_OUTPATIENT_CLINIC_OR_DEPARTMENT_OTHER): Payer: 59 | Admitting: Pain Medicine

## 2017-02-09 VITALS — BP 137/79 | HR 75 | Temp 97.8°F | Resp 16 | Ht 74.0 in | Wt 229.0 lb

## 2017-02-09 DIAGNOSIS — M1288 Other specific arthropathies, not elsewhere classified, other specified site: Secondary | ICD-10-CM | POA: Diagnosis present

## 2017-02-09 DIAGNOSIS — G8929 Other chronic pain: Secondary | ICD-10-CM | POA: Diagnosis not present

## 2017-02-09 DIAGNOSIS — M47816 Spondylosis without myelopathy or radiculopathy, lumbar region: Secondary | ICD-10-CM

## 2017-02-09 DIAGNOSIS — M545 Low back pain: Secondary | ICD-10-CM

## 2017-02-09 DIAGNOSIS — G8918 Other acute postprocedural pain: Secondary | ICD-10-CM

## 2017-02-09 HISTORY — DX: Other acute postprocedural pain: G89.18

## 2017-02-09 MED ORDER — ROPIVACAINE HCL 2 MG/ML IJ SOLN: 9.0000 mL | Freq: Once | INTRAMUSCULAR | Status: DC

## 2017-02-09 MED ORDER — FENTANYL CITRATE (PF) 100 MCG/2ML IJ SOLN
INTRAMUSCULAR | Status: AC
  Administered 2017-02-09: 50 ug via INTRAVENOUS
  Filled 2017-02-09: qty 2

## 2017-02-09 MED ORDER — TRIAMCINOLONE ACETONIDE 40 MG/ML IJ SUSP
INTRAMUSCULAR | Status: AC
Start: 2017-02-09 — End: 2017-02-09
  Administered 2017-02-09: 13:00:00
  Filled 2017-02-09: qty 1

## 2017-02-09 MED ORDER — MIDAZOLAM HCL 5 MG/5ML IJ SOLN: 1.0000 mg | INTRAMUSCULAR | Status: DC | PRN

## 2017-02-09 MED ORDER — ROPIVACAINE HCL 2 MG/ML IJ SOLN
INTRAMUSCULAR | Status: AC
  Administered 2017-02-09: 13:00:00
  Filled 2017-02-09: qty 20

## 2017-02-09 MED ORDER — FENTANYL CITRATE (PF) 100 MCG/2ML IJ SOLN: 25.0000 ug | INTRAMUSCULAR | Status: DC | PRN

## 2017-02-09 MED ORDER — MIDAZOLAM HCL 5 MG/5ML IJ SOLN
INTRAMUSCULAR | Status: AC
Start: 2017-02-09 — End: 2017-02-09
  Administered 2017-02-09: 2 mg via INTRAVENOUS
  Filled 2017-02-09: qty 5

## 2017-02-09 MED ORDER — LACTATED RINGERS IV SOLN: 1000.0000 mL | Freq: Once | INTRAVENOUS | Status: DC

## 2017-02-09 MED ORDER — HYDROCODONE-ACETAMINOPHEN 5-325 MG PO TABS: 1.0000 | ORAL_TABLET | Freq: Four times a day (QID) | ORAL | 0 refills | Status: DC | PRN

## 2017-02-09 MED ORDER — LIDOCAINE HCL (PF) 1 % IJ SOLN: 10.0000 mL | Freq: Once | INTRAMUSCULAR | Status: DC

## 2017-02-09 MED ORDER — TRIAMCINOLONE ACETONIDE 40 MG/ML IJ SUSP: 40.0000 mg | Freq: Once | INTRAMUSCULAR | Status: DC

## 2017-02-09 NOTE — Progress Notes (Signed)
Patient's Name: Willie Krueger  MRN: [id number]  Referring Provider: [facility]  DOB: [birth year]  PCP: [facility]  DOS: 02/09/2017  Note by: Kathlen Brunswick. Dossie Arbour, MD  Service setting: Ambulatory outpatient  Location: [facility] (AMB) Pain Management Facility  Visit type: Procedure  Specialty: Interventional Pain Management  Patient type: Established   Primary Reason for Visit: Interventional Pain Management Treatment. CC: Back Pain (lower on the right)  Procedure:  Anesthesia, Analgesia, Anxiolysis:  Type: Therapeutic Medial Branch Facet Radiofrequency Ablation Region: Lumbar Level: L2, L3, L4, L5, & S1 Medial Branch Level(s) Laterality: Right-Sided  Type: Local Anesthesia with Moderate (Conscious) Sedation Local Anesthetic: Lidocaine 1% Route: Intravenous (IV) IV Access: Secured Sedation: Meaningful verbal contact was maintained at all times during the procedure  Indication(s): Analgesia and Anxiety  Indications: 1. Lumbar facet syndrome (Location of Primary Source of Pain) (Bilateral) (R>L)   2. Chronic low back pain (Location of Primary Source of Pain) (Midline pain) (R>L)   3. Lumbar spondylosis   4. Acute postoperative pain    Mr. Chico has either failed to respond, was unable to tolerate, or simply did not get enough benefit from other more conservative therapies including, but not limited to: 1. Over-the-counter medications 2. Anti-inflammatory medications 3. Muscle relaxants 4. Membrane stabilizers 5. Opioids 6. Physical therapy 7. Modalities (Heat, ice, etc.) 8. Invasive techniques such as nerve blocks. Mr. Gaul has attained more than 50% relief of the pain from a series of diagnostic injections conducted in separate occasions.  Pain Score: Pre-procedure: 5 /10 Post-procedure: 0-No pain/10  Pre-op Assessment:  Previous date of service: 02/02/17 Service provided: Med Refill Mr. Cammarota is a 61 y.o. (year old), male patient, seen today for interventional  treatment. He  has a past surgical history that includes Carpal tunnel release; Spine surgery; Irrigation and debridement foot (Left, 10/13/2016); Amputation toe (Left, 10/2016); PICC LINE PLACE PERIPHERAL ([facility] HX) (Left, [id number]); and Foot surgery (Left, 09/2016). His primarily concern today is the Back Pain (lower on the right)  Initial Vital Signs: Blood pressure 133/82, pulse 76, temperature 98.4 F (36.9 C), temperature source Oral, resp. rate 16, height 6\' 2"  (1.88 m), weight 229 lb (103.9 kg), SpO2 95 %. BMI: 29.40 kg/m  Risk Assessment: Allergies: Reviewed. He is allergic to cleocin [clindamycin]; clindamycin phosphate; other; sulfa antibiotics; and zosyn [piperacillin sod-tazobactam so].  Allergy Precautions: None required Coagulopathies: "Reviewed. None identified.  Blood-thinner therapy: None at this time Active Infection(s): Reviewed. None identified. Mr. Muhs is afebrile  Site Confirmation: Mr. Lollie was asked to confirm the procedure and laterality before marking the site Procedure checklist: Completed Consent: Before the procedure and under the influence of no sedative(s), amnesic(s), or anxiolytics, the patient was informed of the treatment options, risks and possible complications. To fulfill our ethical and legal obligations, as recommended by the American Medical Association's Code of Ethics, I have informed the patient of my clinical impression; the nature and purpose of the treatment or procedure; the risks, benefits, and possible complications of the intervention; the alternatives, including doing nothing; the risk(s) and benefit(s) of the alternative treatment(s) or procedure(s); and the risk(s) and benefit(s) of doing nothing. The patient was provided information about the general risks and possible complications associated with the procedure. These may include, but are not limited to: failure to achieve desired goals, infection, bleeding, organ or nerve damage, allergic  reactions, paralysis, and death. In addition, the patient was informed of those risks and complications associated to Spine-related procedures, such as failure  to decrease pain; infection (i.e.: Meningitis, epidural or intraspinal abscess); bleeding (i.e.: epidural hematoma, subarachnoid hemorrhage, or any other type of intraspinal or peri-dural bleeding); organ or nerve damage (i.e.: Any type of peripheral nerve, nerve root, or spinal cord injury) with subsequent damage to sensory, motor, and/or autonomic systems, resulting in permanent pain, numbness, and/or weakness of one or several areas of the body; allergic reactions; (i.e.: anaphylactic reaction); and/or death. Furthermore, the patient was informed of those risks and complications associated with the medications. These include, but are not limited to: allergic reactions (i.e.: anaphylactic or anaphylactoid reaction(s)); adrenal axis suppression; blood sugar elevation that in diabetics may result in ketoacidosis or comma; water retention that in patients with history of congestive heart failure may result in shortness of breath, pulmonary edema, and decompensation with resultant heart failure; weight gain; swelling or edema; medication-induced neural toxicity; particulate matter embolism and blood vessel occlusion with resultant organ, and/or nervous system infarction; and/or aseptic necrosis of one or more joints. Finally, the patient was informed that Medicine is not an exact science; therefore, there is also the possibility of unforeseen or unpredictable risks and/or possible complications that may result in a catastrophic outcome. The patient indicated having understood very clearly. We have given the patient no guarantees and we have made no promises. Enough time was given to the patient to ask questions, all of which were answered to the patient's satisfaction. Mr. Lonsdale has indicated that he wanted to continue with the procedure. Attestation: I,  the ordering provider, attest that I have discussed with the patient the benefits, risks, side-effects, alternatives, likelihood of achieving goals, and potential problems during recovery for the procedure that I have provided informed consent. Date: 02/09/2017; Time: 11:46 AM  Pre-Procedure Preparation:  Monitoring: As per clinic protocol. Respiration, ETCO2, SpO2, BP, heart rate and rhythm monitor placed and checked for adequate function Safety Precautions: Patient was assessed for positional comfort and pressure points before starting the procedure. Time-out: I initiated and conducted the "Time-out" before starting the procedure, as per protocol. The patient was asked to participate by confirming the accuracy of the "Time Out" information. Verification of the correct person, site, and procedure were performed and confirmed by me, the nursing staff, and the patient. "Time-out" conducted as per Joint Commission's Universal Protocol (UP.01.01.01). "Time-out" Date & Time: 02/09/2017; 1200 hrs.  Description of Procedure Process:   Position: Prone Target Area: For Lumbar Facet blocks, the target is the groove formed by the junction of the transverse process and superior articular process. For the L5 dorsal ramus, the target is the notch between superior articular process and sacral ala. For the S1 dorsal ramus, the target is the superior and lateral edge of the posterior S1 Sacral foramen. Approach: Paraspinal approach. Area Prepped: Entire Posterior Lumbosacral Region Prepping solution: Hibiclens (4.0% Chlorhexidine gluconate solution) Safety Precautions: Aspiration looking for blood return was conducted prior to all injections. At no point did we inject any substances, as a needle was being advanced. No attempts were made at seeking any paresthesias. Safe injection practices and needle disposal techniques used. Medications properly checked for expiration dates. SDV (single dose vial) medications  used. Description of the Procedure: Protocol guidelines were followed. The patient was placed in position over the fluoroscopy table. The target area was identified and the area prepped in the usual manner. Skin desensitized using vapocoolant spray. Skin & deeper tissues infiltrated with local anesthetic. Appropriate amount of time allowed to pass for local anesthetics to take effect. Radiofrequency needles were  introduced to the area of the medial branch at the junction of the superior articular process and transverse process using fluoroscopy. Using the [employer], sensory stimulation using 50 Hz was used to locate & identify the nerve, making sure that the needle was positioned such that there was no sensory stimulation below 0.3 V or above 0.7 V. Stimulation using 2 Hz was used to evaluate the motor component. Care was taken not to lesion any nerves that demonstrated motor stimulation of the lower extremities at an output of less than 2.5 times that of the sensory threshold, or a maximum of 2.0 V. Once satisfactory placement of the needles was achieved, the above solution was slowly injected after negative aspiration. After waiting for at least 2 minutes, the ablation was performed at 80 degrees C for 60 seconds.The needles were then removed and the area cleansed, making sure to leave some of the prepping solution back to take advantage of its long term bactericidal properties. Start Time: 1201 hrs. End Time: 1224 hrs. Materials & Medications:  Needle(s) Type: Teflon-coated, curved tip, Radiofrequency needle(s) Gauge: 22G Length: 10cm Medication(s): We administered ropivacaine (PF) 2 mg/mL (0.2%), triamcinolone acetonide, fentaNYL, and midazolam. Please see chart orders for dosing details.  Imaging Guidance (Spinal):  Type of Imaging Technique: Fluoroscopy Guidance (Spinal) Indication(s): Assistance in needle guidance and placement for procedures requiring needle placement in or  near specific anatomical locations not easily accessible without such assistance. Exposure Time: Please see nurses notes. Contrast: None used. Fluoroscopic Guidance: I was personally present during the use of fluoroscopy. "Tunnel Vision Technique" used to obtain the best possible view of the target area. Parallax error corrected before commencing the procedure. "Direction-depth-direction" technique used to introduce the needle under continuous pulsed fluoroscopy. Once target was reached, antero-posterior, oblique, and lateral fluoroscopic projection used confirm needle placement in all planes. Images permanently stored in EMR. Interpretation: No contrast injected. I personally interpreted the imaging intraoperatively. Adequate needle placement confirmed in multiple planes. Permanent images saved into the patient's record.  Antibiotic Prophylaxis:  Indication(s): None identified Antibiotic given: None  Post-operative Assessment:  EBL: None Complications: No immediate post-treatment complications observed by team, or reported by patient. Note: The patient tolerated the entire procedure well. A repeat set of vitals were taken after the procedure and the patient was kept under observation following institutional policy, for this type of procedure. Post-procedural neurological assessment was performed, showing return to baseline, prior to discharge. The patient was provided with post-procedure discharge instructions, including a section on how to identify potential problems. Should any problems arise concerning this procedure, the patient was given instructions to immediately contact us, at any time, without hesitation. In any case, we plan to contact the patient by telephone for a follow-up status report regarding this interventional procedure. Comments:  No additional relevant information.  Plan of Care  Disposition: Discharge home  Discharge Date & Time: 02/09/2017; 1305 hrs.  Physician-requested  Follow-up:  Return for opposite side RFA (2-wks from now).  Future Appointments Date Time Provider [place]  03/16/2017 1:45 PM Milinda Pointer, MD [facility] None  04/02/2017 11:15 AM Milinda Pointer, MD [facility] None   Medications ordered for procedure: Meds ordered this encounter  Medications  . ropivacaine (PF) 2 mg/mL (0.2%) (NAROPIN) 2 MG/ML injection    Willeen Cass L: cabinet override  . triamcinolone acetonide (KENALOG-40) 40 MG/ML injection    Willeen Cass L: cabinet override  . fentaNYL (SUBLIMAZE) 100 MCG/2ML injection    Willeen Cass L: cabinet override  .  midazolam (VERSED) 5 MG/5ML injection    Willeen Cass L: cabinet override  . fentaNYL (SUBLIMAZE) injection 25-50 mcg    Make sure Narcan is available in the pyxis when using this medication. In the event of respiratory depression (RR< 8/min): Titrate NARCAN (naloxone) in increments of 0.1 to 0.2 mg IV at 2-3 minute intervals, until desired degree of reversal.  . lactated ringers infusion 1,000 mL  . midazolam (VERSED) 5 MG/5ML injection 1-2 mg    Make sure Flumazenil is available in the pyxis when using this medication. If oversedation occurs, administer 0.2 mg IV over 15 sec. If after 45 sec no response, administer 0.2 mg again over 1 min; may repeat at 1 min intervals; not to exceed 4 doses (1 mg)  . triamcinolone acetonide (KENALOG-40) injection 40 mg  . lidocaine (PF) (XYLOCAINE) 1 % injection 10 mL  . ropivacaine (PF) 2 mg/mL (0.2%) (NAROPIN) injection 9 mL  . HYDROcodone-acetaminophen (NORCO/VICODIN) 5-325 MG tablet    Sig: Take 1 tablet by mouth every 6 (six) hours as needed for moderate pain.    Dispense:  40 tablet    Refill:  0    Do not place this medication, or any other prescription from our practice, on "Automatic Refill". Patient may have prescription filled one day early if pharmacy is closed on scheduled refill date. Do not fill until: 02/09/17 To last until: 02/19/17    Medications administered: We administered ropivacaine (PF) 2 mg/mL (0.2%), triamcinolone acetonide, fentaNYL, and midazolam.  See the medical record for exact dosing, route, and time of administration.  Lab-work, Procedure(s), & Referral(s) Ordered: Orders Placed This Encounter  Procedures  . Radiofrequency,Lumbar  . Radiofrequency,Lumbar  . DG C-Arm 1-60 Min-No Report  . Discharge instructions  . Follow-up  . Informed Consent Details: Transcribe to consent form and obtain patient signature  . Provider attestation of informed consent for procedure/surgical case  . Verify informed consent   Imaging Ordered: No results found for this or any previous visit. New Prescriptions   HYDROCODONE-ACETAMINOPHEN (NORCO/VICODIN) 5-325 MG TABLET    Take 1 tablet by mouth every 6 (six) hours as needed for moderate pain.   Primary Care Physician: [facility] Location[facility] Note by: Joie Reamer A. Dossie Arbour, M.D, DABA, DABAPM, DABPM, DABIPP, FIPP Date: 02/09/2017; Time: 2:19 PM  Disclaimer:  Medicine is not an Chief Strategy Officer. The only guarantee in medicine is that nothing is guaranteed. It is important to note that the decision to proceed with this intervention was based on the information collected from the patient. The Data and conclusions were drawn from the patient's questionnaire, the interview, and the physical examination. Because the information was provided in large part by the patient, it cannot be guaranteed that it has not been purposely or unconsciously manipulated. Every effort has been made to obtain as much relevant data as possible for this evaluation. It is important to note that the conclusions that lead to this procedure are derived in large part from the available data. Always take into account that the treatment will also be dependent on availability of resources and existing treatment guidelines, considered by other Pain Management  Practitioners as being common knowledge and practice, at the time of the intervention. For Medico-Legal purposes, it is also important to point out that variation in procedural techniques and pharmacological choices are the acceptable norm. The indications, contraindications, technique, and results of the above procedure should only be interpreted and judged by a Board-Certified Interventional Pain Specialist  with extensive familiarity and expertise in the same exact procedure and technique. Attempts at providing opinions without similar or greater experience and expertise than that of the treating physician will be considered as inappropriate and unethical, and shall result in a formal complaint to the state medical board and applicable specialty societies.  Instructions provided at this appointment: Patient Instructions  Pain Management Discharge Instructions  General Discharge Instructions :  If you need to reach your doctor call: Monday-Friday 8:00 am - 4:00 pm at [phone] or toll free [phone].  After clinic hours [phone] to have operator reach doctor.  Bring all of your medication bottles to all your appointments in the pain clinic.  To cancel or reschedule your appointment with Pain Management please remember to call 24 hours in advance to avoid a fee.  Refer to the educational materials which you have been given on: General Risks, I had my Procedure. Discharge Instructions, Post Sedation.  Post Procedure Instructions:  The drugs you were given will stay in your system until tomorrow, so for the next 24 hours you should not drive, make any legal decisions or drink any alcoholic beverages.  You may eat anything you prefer, but it is better to start with liquids then soups and crackers, and gradually work up to solid foods.  Please notify your doctor immediately if you have any unusual bleeding, trouble breathing or pain that is not related to your normal pain.  Depending  on the type of procedure that was done, some parts of your body may feel week and/or numb.  This usually clears up by tonight or the next day.  Walk with the use of an assistive device or accompanied by an adult for the 24 hours.  You may use ice on the affected area for the first 24 hours.  Put ice in a Ziploc bag and cover with a towel and place against area 15 minutes on 15 minutes off.  You may switch to heat after 24 hours.Radiofrequency Lesioning, Care After Introduction Refer to this sheet in the next few weeks. These instructions provide you with information about caring for yourself after your procedure. Your health care provider may also give you more specific instructions. Your treatment has been planned according to current medical practices, but problems sometimes occur. Call your health care provider if you have any problems or questions after your procedure. What can I expect after the procedure? After the procedure, it is common to have:  Pain from the burned nerve.  Temporary numbness. Follow these instructions at home:  Take over-the-counter and prescription medicines only as told by your health care provider.  Return to your normal activities as told by your health care provider. Ask your health care provider what activities are safe for you.  Pay close attention to how you feel after the procedure. If you start to have pain, write down when it hurts and how it feels. This will help you and your health care provider to know if you need an additional treatment.  Check your needle insertion site every day for signs of infection. Watch for:  Redness, swelling, or pain.  Fluid, blood, or pus.  Keep all follow-up visits as told by your health care provider. This is important. Contact a health care provider if:  Your pain does not get better.  You have redness, swelling, or pain at the needle insertion site.  You have fluid, blood, or pus coming from the needle  insertion site.  You have a fever.  Get help right away if:  You develop sudden, severe pain.  You develop numbness or tingling near the procedure site that does not go away. This information is not intended to replace advice given to you by your health care provider. Make sure you discuss any questions you have with your health care provider. Document Released: 08/14/2011 Document Revised: 05/22/2016 Document Reviewed: 01/22/2015  2017 Elsevier Radiofrequency Lesioning Introduction Radiofrequency lesioning is a procedure that is performed to relieve pain. The procedure is often used for back, neck, or arm pain. Radiofrequency lesioning involves the use of a machine that creates radio waves to make heat. During the procedure, the heat is applied to the nerve that carries the pain signal. The heat damages the nerve and interferes with the pain signal. Pain relief usually starts about 2 weeks after the procedure and lasts for 6 months to 1 year. Tell a health care provider about:  Any allergies you have.  All medicines you are taking, including vitamins, herbs, eye drops, creams, and over-the-counter medicines.  Any problems you or family members have had with anesthetic medicines.  Any blood disorders you have.  Any surgeries you have had.  Any medical conditions you have.  Whether you are pregnant or may be pregnant. What are the risks? Generally, this is a safe procedure. However, problems may occur, including:  Pain or soreness at the injection site.  Infection at the injection site.  Damage to nerves or blood vessels. What happens before the procedure?  Ask your health care provider about:  Changing or stopping your regular medicines. This is especially important if you are taking diabetes medicines or blood thinners.  Taking medicines such as aspirin and ibuprofen. These medicines can thin your blood. Do not take these medicines before your procedure if your health care  provider instructs you not to.  Follow instructions from your health care provider about eating or drinking restrictions.  Plan to have someone take you home after the procedure.  If you go home right after the procedure, plan to have someone with you for 24 hours. What happens during the procedure?  You will be given one or more of the following:  A medicine to help you relax (sedative).  A medicine to numb the area (local anesthetic).  You will be awake during the procedure. You will need to be able to talk with the health care provider during the procedure.  With the help of a type of X-ray (fluoroscopy), the health care provider will insert a radiofrequency needle into the area to be treated.  Next, a wire that carries the radio waves (electrode) will be put through the radiofrequency needle. An electrical pulse will be sent through the electrode to verify the correct nerve. You will feel a tingling sensation, and you may have muscle twitching.  Then, the tissue that is around the needle tip will be heated by an electric current that is passed using the radiofrequency machine. This will numb the nerves.  A bandage (dressing) will be put on the insertion area after the procedure is done. The procedure may vary among health care providers and hospitals. What happens after the procedure?  Your blood pressure, heart rate, breathing rate, and blood oxygen level will be monitored often until the medicines you were given have worn off.  Return to your normal activities as directed by your health care provider. This information is not intended to replace advice given to you by your health care provider. Make sure you  discuss any questions you have with your health care provider. Document Released: 08/13/2011 Document Revised: 05/22/2016 Document Reviewed: 01/22/2015  2017 Elsevier

## 2017-02-09 NOTE — Progress Notes (Signed)
Safety precautions to be maintained throughout the outpatient stay will include: orient to surroundings, keep bed in low position, maintain call bell within reach at all times, provide assistance with transfer out of bed and ambulation.  

## 2017-02-09 NOTE — Patient Instructions (Signed)
Pain Management Discharge Instructions  General Discharge Instructions :  If you need to reach your doctor call: Monday-Friday 8:00 am - 4:00 pm at [phone] or toll free [phone].  After clinic hours [phone] to have operator reach doctor.  Bring all of your medication bottles to all your appointments in the pain clinic.  To cancel or reschedule your appointment with Pain Management please remember to call 24 hours in advance to avoid a fee.  Refer to the educational materials which you have been given on: General Risks, I had my Procedure. Discharge Instructions, Post Sedation.  Post Procedure Instructions:  The drugs you were given will stay in your system until tomorrow, so for the next 24 hours you should not drive, make any legal decisions or drink any alcoholic beverages.  You may eat anything you prefer, but it is better to start with liquids then soups and crackers, and gradually work up to solid foods.  Please notify your doctor immediately if you have any unusual bleeding, trouble breathing or pain that is not related to your normal pain.  Depending on the type of procedure that was done, some parts of your body may feel week and/or numb.  This usually clears up by tonight or the next day.  Walk with the use of an assistive device or accompanied by an adult for the 24 hours.  You may use ice on the affected area for the first 24 hours.  Put ice in a Ziploc bag and cover with a towel and place against area 15 minutes on 15 minutes off.  You may switch to heat after 24 hours.Radiofrequency Lesioning, Care After Introduction Refer to this sheet in the next few weeks. These instructions provide you with information about caring for yourself after your procedure. Your health care provider may also give you more specific instructions. Your treatment has been planned according to current medical practices, but problems sometimes occur. Call your health care provider if you  have any problems or questions after your procedure. What can I expect after the procedure? After the procedure, it is common to have:  Pain from the burned nerve.  Temporary numbness. Follow these instructions at home:  Take over-the-counter and prescription medicines only as told by your health care provider.  Return to your normal activities as told by your health care provider. Ask your health care provider what activities are safe for you.  Pay close attention to how you feel after the procedure. If you start to have pain, write down when it hurts and how it feels. This will help you and your health care provider to know if you need an additional treatment.  Check your needle insertion site every day for signs of infection. Watch for:  Redness, swelling, or pain.  Fluid, blood, or pus.  Keep all follow-up visits as told by your health care provider. This is important. Contact a health care provider if:  Your pain does not get better.  You have redness, swelling, or pain at the needle insertion site.  You have fluid, blood, or pus coming from the needle insertion site.  You have a fever. Get help right away if:  You develop sudden, severe pain.  You develop numbness or tingling near the procedure site that does not go away. This information is not intended to replace advice given to you by your health care provider. Make sure you discuss any questions you have with your health care provider. Document Released: 08/14/2011 Document Revised: 05/22/2016 Document  Reviewed: 01/22/2015  2017 [employer] Radiofrequency Lesioning Introduction Radiofrequency lesioning is a procedure that is performed to relieve pain. The procedure is often used for back, neck, or arm pain. Radiofrequency lesioning involves the use of a machine that creates radio waves to make heat. During the procedure, the heat is applied to the nerve that carries the pain signal. The heat damages the nerve and  interferes with the pain signal. Pain relief usually starts about 2 weeks after the procedure and lasts for 6 months to 1 year. Tell a health care provider about:  Any allergies you have.  All medicines you are taking, including vitamins, herbs, eye drops, creams, and over-the-counter medicines.  Any problems you or family members have had with anesthetic medicines.  Any blood disorders you have.  Any surgeries you have had.  Any medical conditions you have.  Whether you are pregnant or may be pregnant. What are the risks? Generally, this is a safe procedure. However, problems may occur, including:  Pain or soreness at the injection site.  Infection at the injection site.  Damage to nerves or blood vessels. What happens before the procedure?  Ask your health care provider about:  Changing or stopping your regular medicines. This is especially important if you are taking diabetes medicines or blood thinners.  Taking medicines such as aspirin and ibuprofen. These medicines can thin your blood. Do not take these medicines before your procedure if your health care provider instructs you not to.  Follow instructions from your health care provider about eating or drinking restrictions.  Plan to have someone take you home after the procedure.  If you go home right after the procedure, plan to have someone with you for 24 hours. What happens during the procedure?  You will be given one or more of the following:  A medicine to help you relax (sedative).  A medicine to numb the area (local anesthetic).  You will be awake during the procedure. You will need to be able to talk with the health care provider during the procedure.  With the help of a type of X-ray (fluoroscopy), the health care provider will insert a radiofrequency needle into the area to be treated.  Next, a wire that carries the radio waves (electrode) will be put through the radiofrequency needle. An electrical  pulse will be sent through the electrode to verify the correct nerve. You will feel a tingling sensation, and you may have muscle twitching.  Then, the tissue that is around the needle tip will be heated by an electric current that is passed using the radiofrequency machine. This will numb the nerves.  A bandage (dressing) will be put on the insertion area after the procedure is done. The procedure may vary among health care providers and hospitals. What happens after the procedure?  Your blood pressure, heart rate, breathing rate, and blood oxygen level will be monitored often until the medicines you were given have worn off.  Return to your normal activities as directed by your health care provider. This information is not intended to replace advice given to you by your health care provider. Make sure you discuss any questions you have with your health care provider. Document Released: 08/13/2011 Document Revised: 05/22/2016 Document Reviewed: 01/22/2015  2017 [employer]

## 2017-02-10 ENCOUNTER — Telehealth: Payer: Self-pay | Admitting: *Deleted

## 2017-02-10 NOTE — Telephone Encounter (Signed)
Voicemail left for patient to call our office if there are questions or concerns from procedure on yesterday.  

## 2017-03-16 ENCOUNTER — Ambulatory Visit: Payer: 59 | Attending: Pain Medicine | Admitting: Pain Medicine

## 2017-03-16 ENCOUNTER — Telehealth: Payer: Self-pay

## 2017-03-16 ENCOUNTER — Encounter: Payer: Self-pay | Admitting: Pain Medicine

## 2017-03-16 VITALS — BP 154/85 | HR 79 | Temp 97.3°F | Resp 18 | Ht 74.0 in | Wt 229.0 lb

## 2017-03-16 DIAGNOSIS — E11621 Type 2 diabetes mellitus with foot ulcer: Secondary | ICD-10-CM | POA: Insufficient documentation

## 2017-03-16 DIAGNOSIS — M79601 Pain in right arm: Secondary | ICD-10-CM | POA: Diagnosis not present

## 2017-03-16 DIAGNOSIS — F1190 Opioid use, unspecified, uncomplicated: Secondary | ICD-10-CM

## 2017-03-16 DIAGNOSIS — E669 Obesity, unspecified: Secondary | ICD-10-CM | POA: Insufficient documentation

## 2017-03-16 DIAGNOSIS — M1288 Other specific arthropathies, not elsewhere classified, other specified site: Secondary | ICD-10-CM | POA: Diagnosis not present

## 2017-03-16 DIAGNOSIS — Z880 Allergy status to penicillin: Secondary | ICD-10-CM | POA: Diagnosis not present

## 2017-03-16 DIAGNOSIS — M545 Low back pain, unspecified: Secondary | ICD-10-CM

## 2017-03-16 DIAGNOSIS — E1142 Type 2 diabetes mellitus with diabetic polyneuropathy: Secondary | ICD-10-CM | POA: Insufficient documentation

## 2017-03-16 DIAGNOSIS — M79602 Pain in left arm: Secondary | ICD-10-CM | POA: Diagnosis not present

## 2017-03-16 DIAGNOSIS — Z79899 Other long term (current) drug therapy: Secondary | ICD-10-CM | POA: Diagnosis not present

## 2017-03-16 DIAGNOSIS — F17210 Nicotine dependence, cigarettes, uncomplicated: Secondary | ICD-10-CM | POA: Diagnosis not present

## 2017-03-16 DIAGNOSIS — M79604 Pain in right leg: Secondary | ICD-10-CM | POA: Insufficient documentation

## 2017-03-16 DIAGNOSIS — M150 Primary generalized (osteo)arthritis: Secondary | ICD-10-CM | POA: Diagnosis not present

## 2017-03-16 DIAGNOSIS — Z5181 Encounter for therapeutic drug level monitoring: Secondary | ICD-10-CM | POA: Insufficient documentation

## 2017-03-16 DIAGNOSIS — Z79891 Long term (current) use of opiate analgesic: Secondary | ICD-10-CM | POA: Diagnosis not present

## 2017-03-16 DIAGNOSIS — M47816 Spondylosis without myelopathy or radiculopathy, lumbar region: Secondary | ICD-10-CM

## 2017-03-16 DIAGNOSIS — M79605 Pain in left leg: Secondary | ICD-10-CM

## 2017-03-16 DIAGNOSIS — G8929 Other chronic pain: Secondary | ICD-10-CM

## 2017-03-16 DIAGNOSIS — E1169 Type 2 diabetes mellitus with other specified complication: Secondary | ICD-10-CM | POA: Diagnosis not present

## 2017-03-16 DIAGNOSIS — L97529 Non-pressure chronic ulcer of other part of left foot with unspecified severity: Secondary | ICD-10-CM | POA: Insufficient documentation

## 2017-03-16 DIAGNOSIS — Z6829 Body mass index (BMI) 29.0-29.9, adult: Secondary | ICD-10-CM | POA: Diagnosis not present

## 2017-03-16 DIAGNOSIS — Z7984 Long term (current) use of oral hypoglycemic drugs: Secondary | ICD-10-CM | POA: Insufficient documentation

## 2017-03-16 DIAGNOSIS — M159 Polyosteoarthritis, unspecified: Secondary | ICD-10-CM

## 2017-03-16 DIAGNOSIS — G894 Chronic pain syndrome: Secondary | ICD-10-CM | POA: Diagnosis present

## 2017-03-16 DIAGNOSIS — M792 Neuralgia and neuritis, unspecified: Secondary | ICD-10-CM

## 2017-03-16 MED ORDER — OXYCODONE HCL 5 MG PO TABS
5.0000 mg | ORAL_TABLET | Freq: Two times a day (BID) | ORAL | 0 refills | Status: DC
Start: 2017-04-06 — End: 2017-04-20

## 2017-03-16 MED ORDER — OXYCODONE HCL 5 MG PO TABS: 5.0000 mg | ORAL_TABLET | Freq: Every day | ORAL | 0 refills | Status: DC

## 2017-03-16 MED ORDER — OXYCODONE HCL 5 MG PO TABS: 5.0000 mg | ORAL_TABLET | Freq: Four times a day (QID) | ORAL | 0 refills | Status: DC

## 2017-03-16 MED ORDER — OXYCODONE HCL 5 MG PO TABS: 5.0000 mg | ORAL_TABLET | Freq: Three times a day (TID) | ORAL | 0 refills | Status: DC

## 2017-03-16 MED ORDER — MELOXICAM 15 MG PO TABS: 15.0000 mg | ORAL_TABLET | Freq: Every day | ORAL | 0 refills | Status: DC

## 2017-03-16 MED ORDER — OXYCODONE HCL 10 MG PO TABS: 10.0000 mg | ORAL_TABLET | Freq: Two times a day (BID) | ORAL | 0 refills | Status: DC

## 2017-03-16 MED ORDER — OXYCODONE HCL 5 MG PO TABS: 5.0000 mg | ORAL_TABLET | Freq: Two times a day (BID) | ORAL | 0 refills | Status: DC

## 2017-03-16 MED ORDER — GABAPENTIN 600 MG PO TABS: 1200.0000 mg | ORAL_TABLET | Freq: Four times a day (QID) | ORAL | 0 refills | Status: DC

## 2017-03-16 NOTE — Progress Notes (Signed)
Patient's Name: Willie Krueger  MRN: [id number]  Referring Provider: [facility]  DOB: January 02, 1956  PCP: [facility]  DOS: 03/16/2017  Note by: Kathlen Brunswick. Dossie Arbour, MD  Service setting: Ambulatory outpatient  Specialty: Interventional Pain Management  Location: [facility] (AMB) Pain Management Facility    Patient type: Established   Primary Reason(s) for Visit: Encounter for prescription drug management & post-procedure evaluation of chronic illness with mild to moderate exacerbation(Level of risk: moderate) CC: Arm Pain (bilateral)  HPI  Willie Krueger is a 61 y.o. year old, male patient, who comes today for a post-procedure evaluation and medication management. He has Toe ulcer ([name]); Cervical spinal cord compression ([name]); Cardiac murmur; Chronic hepatitis C virus infection ([name]); HLD (hyperlipidemia); Benign hypertension; ED (erectile dysfunction) of organic origin; MI (mitral incompetence); Adiposity; Current smoker; History of decompression of median nerve; Long term current use of opiate analgesic; Long term prescription opiate use; Opiate use (67.5 MME/Day); Opiate dependence ([place]); Encounter for therapeutic drug level monitoring; Chronic neck pain (Right side); Cervical spondylosis; Cervical post-laminectomy syndrome; Chronic low back pain (Location of Primary Source of Pain) (Midline pain) (R>L); Chronic cervical radicular pain (Bilateral) (R>L); Chronic lumbar radicular pain (Bilateral) (R>L); Substance use disorder Risk: LOW; Failed back surgical syndrome; Chronic lower extremity pain (Location of Secondary source of pain) (Bilateral) (R>L); History of carpal tunnel release of both wrists; Grade I anterolisthesis of C7 on T1; Encounter for chronic pain management; Elevated C-reactive protein (CRP); Diabetic osteomyelitis ([place]); Neuropathy ([place]); Obesity; Neurogenic pain; Type 2 diabetes mellitus with foot ulcer (CODE) ([place]); Diabetic peripheral neuropathy ([place]); Chronic shoulder pain  (Location of Tertiary source of pain) (Bilateral) (R>L); Lumbar facet syndrome (Location of Primary Source of Pain) (Bilateral) (R>L); Abnormal MRI, lumbar spine (05/19/2016); Lumbar spondylosis; Diabetic infection of left foot ([place]); Allergy to drug; Diabetic foot ulcer with osteomyelitis ([place]); Chronic pain syndrome; Allergic drug rash due to anti-infective agent; Enlarged thyroid; Acute postoperative pain; and Osteoarthritis on his problem list. His primarily concern today is the Arm Pain (bilateral)  Pain Assessment: Self-Reported Pain Score: 0-No pain/10             Reported level is compatible with observation.       Pain Type: Chronic pain Pain Location: Arm Pain Orientation: Right, Left Pain Descriptors / Indicators: Burning Pain Frequency: Intermittent  Willie Krueger was last seen on 02/09/2017 for a procedure. During today's appointment we reviewed Willie Krueger's post-procedure results, as well as his outpatient medication regimen. The patient returns to the clinics today after having had a right sided lumbar facet radiofrequency ablation done on 02/09/2017. According to the patient, this has provided him 100% relief of his low back pain. Initially he was taking oxycodone IR 15 mg up to 5 times a day but after the radiofrequency he has been able to go down to 3 times per day. The radiofrequency by itself has lower his opioid requirements from 75 mg of oxycodone per day to 45 mg per day. We will now take the opportunity to taper his opioids down and stop them for at least 2 weeks for a "drug holiday. If at all possible, we will simply not restart them. Apparently there was a misunderstanding with regards to the purpose of this patient's visit today since I had scheduled him to return in 2 weeks for the radiofrequency on the opposite side. As it turns out, he was brought in for a follow-up evaluation instead of the radiofrequency on the left side. The patient was  rather upset about this since his wife had  to take day off to do the RFA on the left side. I have apologized to him and we will look into what happened to see if we can prevent it from happening again.  At this point, his primary concern is that of a feeling of fullness in his hands which he initially thought was secondary to carpal [place] syndrome. However, during today's evaluation he had a negative Tinel test and negative Phalen's test. What he does seem to have is osteoarthritis and this is why today I have started him on Mobic 15 mg by mouth daily. The patient indicates that he was taken off of the Xarelto. At this point he is no longer taking any blood thinners.  Further details on both, my assessment(s), as well as the proposed treatment plan, please see below.  Controlled Substance Pharmacotherapy Assessment REMS (Risk Evaluation and Mitigation Strategy)  Analgesic:Oxycodone IR 15 mg 3 times a day (45 mg/day). (The patient was able to drop from 5 pills per day to 3 pills per day after the radiofrequency.) MME/day:67.5 mg/day Zenovia Jarred, RN  03/16/2017  3:07 PM  Signed Oxycodone #45 wasted in toilet with patient and [name] Friendly as witness.[facility] notified.  Pharmacist states that they have 1 refill on file.  Pharmacist was instructed to mail it back to our clinic.Pharmacist name is Snjezana.  [name], RN  03/16/2017  1:45 PM  Sign at close encounter Safety precautions to be maintained throughout the outpatient stay will include: orient to surroundings, keep bed in low position, maintain call bell within reach at all times, provide assistance with transfer out of bed and ambulation.    Pharmacokinetics: Liberation and absorption (onset of action): WNL Distribution (time to peak effect): WNL Metabolism and excretion (duration of action): WNL         Pharmacodynamics: Desired effects: Analgesia: Willie Krueger reports >50% benefit. Functional ability: Patient reports that medication allows him to accomplish basic  ADLs Clinically meaningful improvement in function (CMIF): Sustained CMIF goals met Perceived effectiveness: Described as relatively effective, allowing for increase in activities of daily living (ADL) Undesirable effects: Side-effects or Adverse reactions: None reported Monitoring: [place] PMP: Online review of the past 68-monthperiod conducted. Compliant with practice rules and regulations List of all UDS test(s) done:  Lab Results  Component Value Date   TOXASSSELUR FINAL 12/08/2016   [place]FINAL 05/28/2016   TBalt[place] HighlandsFINAL 02/27/2016   TP[place]FINAL 12/03/2015   TM[place]FINAL 10/31/2015   Last UDS on record: ToxAssure Select 13  Date Value Ref Range Status  12/08/2016 FINAL  Final    Comment:    ==================================================================== TOXASSURE SELECT 13 (MW) ==================================================================== Test                             Result       Flag       Units Drug Present and Declared for Prescription Verification   Oxycodone                      2419         EXPECTED   ng/mg creat   Oxymorphone                    2173         EXPECTED   ng/mg creat   Noroxycodone  3938         EXPECTED   ng/mg creat   Noroxymorphone                 815          EXPECTED   ng/mg creat    Sources of oxycodone are scheduled prescription medications.    Oxymorphone, noroxycodone, and noroxymorphone are expected    metabolites of oxycodone. Oxymorphone is also available as a    scheduled prescription medication. Drug Present not Declared for Prescription Verification   Alcohol, Ethyl                 0.253        UNEXPECTED g/dL    Sources of ethyl alcohol include alcoholic beverages or as a    fermentation product of glucose; glucose is present in this    specimen.  Interpret result with caution, as the presence of    ethyl alcohol is likely due, at least in part, to fermentation of     glucose. ==================================================================== Test                      Result    Flag   Units      Ref Range   Creatinine              26               mg/dL      >=20 ==================================================================== Declared Medications:  The flagging and interpretation on this report are based on the  following declared medications.  Unexpected results may arise from  inaccuracies in the declared medications.  **Note: The testing scope of this panel includes these medications:  Oxycodone  **Note: The testing scope of this panel does not include following  reported medications:  Acetaminophen  Bisacodyl  Diphenhydramine (Benadryl)  Docusate (Colace)  Furosemide (Lasix)  Gabapentin (Neurontin)  Glimepiride (Amaryl)  Lisinopril  Metformin  Prednisone  Rivaroxaban  Sildenafil (Viagra)  Vitamin D3 ==================================================================== For clinical consultation, please call [phone]. ====================================================================    UDS interpretation: Compliant          Medication Assessment Form: Reviewed. Patient indicates being compliant with therapy Treatment compliance: Compliant Risk Assessment Profile: Aberrant behavior: See prior evaluations. None observed or detected today Comorbid factors increasing risk of overdose: See prior notes. No additional risks detected today Risk of substance use disorder (SUD): Low Opioid Risk Tool (ORT) Total Score:    Interpretation Table:  Score <3 = Low Risk for SUD  Score between 4-7 = Moderate Risk for SUD  Score >8 = High Risk for Opioid Abuse   Risk Mitigation Strategies:  Patient Counseling: Covered Patient-Prescriber Agreement (PPA): Present and active  Notification to other healthcare providers: Done  Pharmacologic Plan: No change in therapy, at this time  Post-Procedure Assessment  02/09/2017 Procedure: Right  sided lumbar facet radiofrequency ablation under fluoroscopic guidance and IV sedation Post-procedure pain score: 0/10 (100% relief) Influential Factors: BMI: 29.40 kg/m Intra-procedural challenges: None observed Assessment challenges: None detected         Post-procedural side-effects, adverse reactions, or complications: None reported Reported issues: None  Sedation: Sedation provided. When no sedatives are used, the analgesic levels obtained are directly associated to the effectiveness of the local anesthetics. However, when sedation is provided, the level of analgesia obtained during the initial 1 hour following the intervention, is believed to be the result of a combination of factors. These factors may  include, but are not limited to: 1. The effectiveness of the local anesthetics used. 2. The effects of the analgesic(s) and/or anxiolytic(s) used. 3. The degree of discomfort experienced by the patient at the time of the procedure. 4. The patients ability and reliability in recalling and recording the events. 5. The presence and influence of possible secondary gains and/or psychosocial factors. Reported result: Relief experienced during the 1st hour after the procedure: 100 % (Ultra-Short Term Relief) Interpretative annotation: Analgesia during this period is likely to be Local Anesthetic and/or IV Sedative (Analgesic/Anxiolitic) related.          Effects of local anesthetic: The analgesic effects attained during this period are directly associated to the localized infiltration of local anesthetics and therefore cary significant diagnostic value as to the etiological location, or anatomical origin, of the pain. Expected duration of relief is directly dependent on the pharmacodynamics of the local anesthetic used. Long-acting (4-6 hours) anesthetics used.  Reported result: Relief during the next 4 to 6 hour after the procedure: 100 % (Short-Term Relief) Interpretative annotation: Complete  relief would suggest area to be the source of the pain.          Long-term benefit: Defined as the period of time past the expected duration of local anesthetics. With the possible exception of prolonged sympathetic blockade from the local anesthetics, benefits during this period are typically attributed to, or associated with, other factors such as analgesic sensory neuropraxia, antiinflammatory effects, or beneficial biochemical changes provided by agents other than the local anesthetics Reported result: Extended relief following procedure: 100 % (Long-Term Relief) Interpretative annotation: Good relief. Possible therapeutic success. Benefit could signal adequate RF ablation  Current benefits: Defined as persistent relief that continues at this point in time.   Reported results: Treated area: 100 % Mr. Zapata reports improvement in function Interpretative annotation: Long-term benefit would suggest adequate RF ablation  Interpretation: Results would suggest adequate radiofrequency ablation. Excellent results. The patient has been able to drop from 75 mg per day of oxycodone to 45 mg. We will take this opportunity to complete a drug holiday and hopefully get rid of the tolerance that the patient currently has to the narcotics.  Laboratory Chemistry  Inflammation Markers Lab Results  Component Value Date   CRP 28.9 (H) 10/13/2016   ESRSEDRATE 10 11/21/2016   (CRP: Acute Phase) (ESR: Chronic Phase) Renal Function Markers Lab Results  Component Value Date   BUN 21 (H) 11/22/2016   CREATININE 1.13 11/22/2016   GFRAA >60 11/22/2016   GFRNONAA >60 11/22/2016   Hepatic Function Markers Lab Results  Component Value Date   AST 22 11/21/2016   ALT 11 (L) 11/21/2016   ALBUMIN 3.9 11/21/2016   ALKPHOS 87 11/21/2016   Electrolytes Lab Results  Component Value Date   NA 133 (L) 11/22/2016   K 4.4 11/22/2016   CL 103 11/22/2016   CALCIUM 8.8 (L) 11/22/2016   MG 1.9 02/27/2016    Neuropathy Markers No results found for: [id number] Bone Pathology Markers Lab Results  Component Value Date   ALKPHOS 87 11/21/2016   CALCIUM 8.8 (L) 11/22/2016   Coagulation Parameters Lab Results  Component Value Date   PLT 193 11/22/2016   Cardiovascular Markers Lab Results  Component Value Date   BNP 86.0 11/21/2016   HGB 14.3 11/22/2016   HCT 41.7 11/22/2016   Note: Lab results reviewed.  Recent Diagnostic Imaging Review  Dg C-arm 1-60 Min-no Report  Result Date: 02/09/2017 Fluoroscopy was utilized by the  requesting physician.  No radiographic interpretation.   Note: Imaging results reviewed.          Meds  The patient has a current medication list which includes the following prescription(s): acetaminophen, atorvastatin, vitamin d3, [facility] bisacodyl, diphenhydramine, docusate sodium, furosemide, gabapentin, glimepiride, glipizide, lisinopril, meloxicam, metformin, oxycodone, oxycodone, oxycodone, oxycodone, oxycodone, oxycodone, oxycodone, oxycodone, oxycodone, oxycodone, oxycodone hcl, rivaroxaban, and sildenafil.  Current Outpatient Prescriptions on File Prior to Visit  Medication Sig   acetaminophen (TYLENOL) 500 MG tablet Take 500 mg by mouth every 6 (six) hours as needed.   atorvastatin (LIPITOR) 40 MG tablet Take 40 mg by mouth daily.   Cholecalciferol (VITAMIN D3) 2000 units capsule Take 2,000 Units by mouth daily.    [facility] BISACODYL 5 MG EC tablet 5 mg daily as needed.    diphenhydrAMINE (BENADRYL) 25 MG tablet Take 25 mg by mouth every 6 (six) hours as needed.   docusate sodium (COLACE) 100 MG capsule Take 1 capsule (100 mg total) by mouth 2 (two) times daily.   furosemide (LASIX) 20 MG tablet 20 mg as needed.    glimepiride (AMARYL) 2 MG tablet Take 2 mg by mouth daily with breakfast.    lisinopril (PRINIVIL,ZESTRIL) 40 MG tablet Take 40 mg by mouth daily.   metFORMIN (GLUCOPHAGE-XR) 500 MG 24 hr tablet Take 500 mg by mouth 2 (two) times daily.    sildenafil (VIAGRA) 100 MG tablet Take 100 mg by mouth daily as needed for erectile dysfunction.   No current facility-administered medications on file prior to visit.    ROS  Constitutional: Denies any fever or chills Gastrointestinal: No reported hemesis, hematochezia, vomiting, or acute GI distress Musculoskeletal: Denies any acute onset joint swelling, redness, loss of ROM, or weakness Neurological: No reported episodes of acute onset apraxia, aphasia, dysarthria, agnosia, amnesia, paralysis, loss of coordination, or loss of consciousness  Allergies  Mr. Dayrit is allergic to cleocin [clindamycin]; clindamycin phosphate; other; sulfa antibiotics; and zosyn [piperacillin sod-tazobactam so].  [place]  Drug: Mr. Pham  reports that he does not use drugs. Alcohol:  reports that he does not drink alcohol. Tobacco:  reports that he has been smoking Cigarettes.  He has been smoking about 1.50 packs per day. He has never used smokeless tobacco. Medical:  has a past medical history of Chronic pain; Chronic pain associated with significant psychosocial dysfunction (05/04/2015); Diabetes mellitus without complication ([place]); Heart murmur; History of spinal stenosis (10/31/2015); Hyperlipidemia; Hypertension; Leg weakness (07/07/2014); Mitral valve regurgitation; Nerve root inflammation (06/26/2014); and Osteoarthritis. Family: family history includes Cancer in his mother; Diabetes in his mother; Heart disease in his father.  Past Surgical History:  Procedure Laterality Date   AMPUTATION TOE Left 10/2016   partial   CARPAL TUNNEL RELEASE     FOOT SURGERY Left 09/2016   IRRIGATION AND DEBRIDEMENT FOOT Left 10/13/2016   Procedure: IRRIGATION AND DEBRIDEMENT FOOT;  Surgeon: Samara Deist, DPM;  Location: [facility] ORS;  Service: Podiatry;  Laterality: Left;   PICC LINE PLACE PERIPHERAL ([place] HX) Left [id number]   SPINE SURGERY     Constitutional Exam  General appearance: Well nourished, well developed, and  well hydrated. In no apparent acute distress Vitals:   03/16/17 1341  BP: (!) 154/85  Pulse: 79  Resp: 18  Temp: 97.3 F (36.3 C)  TempSrc: Oral  SpO2: 97%  Weight: 229 lb (103.9 kg)  Height: 6' 2"  (1.88 m)   BMI Assessment: Estimated body mass index is 29.4 kg/m as calculated from the following:  Height as of this encounter: 6' 2"  (1.88 m).   Weight as of this encounter: 229 lb (103.9 kg).  BMI interpretation table: BMI level Category Range association with higher incidence of chronic pain  <18 kg/m2 Underweight   18.5-24.9 kg/m2 Ideal body weight   25-29.9 kg/m2 Overweight Increased incidence by 20%  30-34.9 kg/m2 Obese (Class I) Increased incidence by 68%  35-39.9 kg/m2 Severe obesity (Class II) Increased incidence by 136%  >40 kg/m2 Extreme obesity (Class III) Increased incidence by 254%   BMI Readings from Last 4 Encounters:  03/16/17 29.40 kg/m  02/09/17 29.40 kg/m  02/02/17 29.40 kg/m  12/05/16 29.40 kg/m   Wt Readings from Last 4 Encounters:  03/16/17 229 lb (103.9 kg)  02/09/17 229 lb (103.9 kg)  02/02/17 229 lb (103.9 kg)  12/05/16 229 lb (103.9 kg)  Psych/Mental status: Alert, oriented x 3 (person, place, & time)       Eyes: PERLA Respiratory: No evidence of acute respiratory distress  Cervical Spine Exam  Inspection: No masses, redness, or swelling Alignment: Symmetrical Functional ROM: Unrestricted ROM Stability: No instability detected Muscle strength & Tone: Functionally intact Sensory: Unimpaired Palpation: Non-contributory  Upper Extremity (UE) Exam    Side: Right upper extremity  Side: Left upper extremity  Inspection: No masses, redness, swelling, or asymmetry. No contractures  Inspection: No masses, redness, swelling, or asymmetry. No contractures  Functional ROM: Unrestricted ROM          Functional ROM: Unrestricted ROM          Muscle strength & Tone: Functionally intact  Muscle strength & Tone: Functionally intact  Sensory:  Unimpaired  Sensory: Unimpaired  Palpation: Euthermic  Palpation: Euthermic  Specialized Test(s): Deferred         Specialized Test(s): Deferred          Thoracic Spine Exam  Inspection: No masses, redness, or swelling Alignment: Symmetrical Functional ROM: Unrestricted ROM Stability: No instability detected Sensory: Unimpaired Muscle strength & Tone: Functionally intact Palpation: Non-contributory  Lumbar Spine Exam  Inspection: No masses, redness, or swelling Alignment: Symmetrical Functional ROM: Improved after treatment Stability: No instability detected Muscle strength & Tone: Functionally intact Sensory: Movement-associated pain Palpation: Complains of area being tender to palpation Provocative Tests: Lumbar Hyperextension and rotation test: evaluation deferred today       Patrick's Maneuver: evaluation deferred today              Gait & Posture Assessment  Ambulation: Unassisted Gait: Relatively normal for age and body habitus Posture: WNL   Lower Extremity Exam    Side: Right lower extremity  Side: Left lower extremity  Inspection: No masses, redness, swelling, or asymmetry. No contractures  Inspection: No masses, redness, swelling, or asymmetry. No contractures  Functional ROM: Unrestricted ROM          Functional ROM: Unrestricted ROM          Muscle strength & Tone: Functionally intact  Muscle strength & Tone: Functionally intact  Sensory: Unimpaired  Sensory: Unimpaired  Palpation: No palpable anomalies  Palpation: No palpable anomalies   Assessment  Primary Diagnosis & Pertinent Problem List: The primary encounter diagnosis was Chronic pain syndrome. Diagnoses of Chronic low back pain (Location of Primary Source of Pain) (Midline pain) (R>L), Lumbar facet syndrome (Location of Primary Source of Pain) (Bilateral) (R>L), Chronic lower extremity pain (Location of Secondary source of pain) (Bilateral) (R>L), Neurogenic pain, Osteoarthritis, Long term prescription  opiate use, and Opiate use (112.5 MME/Day) were also pertinent  to this visit.  Status Diagnosis  Controlled Controlled Controlled 1. Chronic pain syndrome   2. Chronic low back pain (Location of Primary Source of Pain) (Midline pain) (R>L)   3. Lumbar facet syndrome (Location of Primary Source of Pain) (Bilateral) (R>L)   4. Chronic lower extremity pain (Location of Secondary source of pain) (Bilateral) (R>L)   5. Neurogenic pain   6. Osteoarthritis   7. Long term prescription opiate use   8. Opiate use (112.5 MME/Day)      Plan of Care  Pharmacotherapy (Medications Ordered): Meds ordered this encounter  Medications   meloxicam (MOBIC) 15 MG tablet    Sig: Take 1 tablet (15 mg total) by mouth daily.    Dispense:  30 tablet    Refill:  0    Do not add this medication to the electronic "Automatic Refill" notification system. Patient may have prescription filled one day early if pharmacy is closed on scheduled refill date.   gabapentin (NEURONTIN) 600 MG tablet    Sig: Take 2 tablets (1,200 mg total) by mouth every 6 (six) hours.    Dispense:  720 tablet    Refill:  0    Do not place this medication, or any other prescription from our practice, on "Automatic Refill". Patient may have prescription filled one day early if pharmacy is closed on scheduled refill date.   DISCONTD: Oxycodone HCl 10 MG TABS    Sig: Take 1 tablet (10 mg total) by mouth 2 (two) times daily. Max: 2/day    Dispense:  14 tablet    Refill:  0    This is an opioid taper. Provide patient with medication in the exact order and day as requested. Patient may have prescription filled one day early if pharmacy is closed on scheduled refill date. Do not fill until: 03/16/17 To last until: 04/20/17   oxyCODONE (OXY IR/ROXICODONE) 5 MG immediate release tablet    Sig: Take 1 tablet (5 mg total) by mouth 5 (five) times daily. Max: 5/day    Dispense:  35 tablet    Refill:  0    This is an opioid taper. Provide patient  with medication in the exact order and day as requested. Patient may have prescription filled one day early if pharmacy is closed on scheduled refill date. Do not fill until: 03/16/17 To last until: 03/23/17   oxyCODONE (OXY IR/ROXICODONE) 5 MG immediate release tablet    Sig: Take 1 tablet (5 mg total) by mouth 4 (four) times daily. Max: 4/day    Dispense:  28 tablet    Refill:  0    This is an opioid taper. Provide patient with medication in the exact order and day as requested. Patient may have prescription filled one day early if pharmacy is closed on scheduled refill date. Do not fill until: 03/23/17 To last until: 03/30/17   oxyCODONE (OXY IR/ROXICODONE) 5 MG immediate release tablet    Sig: Take 1 tablet (5 mg total) by mouth 3 (three) times daily. Max: 3/day    Dispense:  21 tablet    Refill:  0    This is an opioid taper. Provide patient with medication in the exact order and day as requested. Patient may have prescription filled one day early if pharmacy is closed on scheduled refill date. Do not fill until: 03/30/17 To last until: 04/06/17   oxyCODONE (OXY IR/ROXICODONE) 5 MG immediate release tablet    Sig: Take 1 tablet (5 mg total) by mouth  2 (two) times daily. Max: 2/day    Dispense:  14 tablet    Refill:  0    This is an opioid taper. Provide patient with medication in the exact order and day as requested. Patient may have prescription filled one day early if pharmacy is closed on scheduled refill date. Do not fill until: 04/06/17 To last until: 04/13/17   oxyCODONE (OXY IR/ROXICODONE) 5 MG immediate release tablet    Sig: Take 1 tablet (5 mg total) by mouth daily. Max: 1/day    Dispense:  7 tablet    Refill:  0    This is an opioid taper. Provide patient with medication in the exact order and day as requested. Patient may have prescription filled one day early if pharmacy is closed on scheduled refill date. Do not fill until: 04/13/17 To last until: 04/20/17    oxyCODONE (OXY IR/ROXICODONE) 5 MG immediate release tablet    Sig: Take 1 tablet (5 mg total) by mouth 5 (five) times daily. Max: 5/day    Dispense:  35 tablet    Refill:  0    This is an opioid taper. Provide patient with medication in the exact order and day as requested. Patient may have prescription filled one day early if pharmacy is closed on scheduled refill date. Do not fill until: 04/20/17 To last until: 04/27/17   oxyCODONE (OXY IR/ROXICODONE) 5 MG immediate release tablet    Sig: Take 1 tablet (5 mg total) by mouth 4 (four) times daily. Max: 4/day    Dispense:  28 tablet    Refill:  0    This is an opioid taper. Provide patient with medication in the exact order and day as requested. Patient may have prescription filled one day early if pharmacy is closed on scheduled refill date. Do not fill until: 04/27/17 To last until: 05/04/17   oxyCODONE (OXY IR/ROXICODONE) 5 MG immediate release tablet    Sig: Take 1 tablet (5 mg total) by mouth 3 (three) times daily. Max: 3/day    Dispense:  21 tablet    Refill:  0    This is an opioid taper. Provide patient with medication in the exact order and day as requested. Patient may have prescription filled one day early if pharmacy is closed on scheduled refill date. Do not fill until: 05/04/17 To last until: 05/11/17   oxyCODONE (OXY IR/ROXICODONE) 5 MG immediate release tablet    Sig: Take 1 tablet (5 mg total) by mouth 2 (two) times daily. Max: 2/day    Dispense:  14 tablet    Refill:  0    This is an opioid taper. Provide patient with medication in the exact order and day as requested. Patient may have prescription filled one day early if pharmacy is closed on scheduled refill date. Do not fill until: 05/11/17 To last until: 05/18/17   oxyCODONE (OXY IR/ROXICODONE) 5 MG immediate release tablet    Sig: Take 1 tablet (5 mg total) by mouth daily. Max: 1/day    Dispense:  7 tablet    Refill:  0    This is an opioid taper. Provide  patient with medication in the exact order and day as requested. Patient may have prescription filled one day early if pharmacy is closed on scheduled refill date. Do not fill until: 05/18/17 To last until: 05/25/17   Oxycodone HCl 10 MG TABS    Sig: Take 1 tablet (10 mg total) by mouth 2 (two) times daily. Max:  2/day    Dispense:  70 tablet    Refill:  0    This prescription is part of an opioid taper. Provide patient with medication in the exact order and day as requested. Do not fill until: 03/16/17 To last until: 04/20/17   New Prescriptions   MELOXICAM (MOBIC) 15 MG TABLET    Take 1 tablet (15 mg total) by mouth daily.   OXYCODONE (OXY IR/ROXICODONE) 5 MG IMMEDIATE RELEASE TABLET    Take 1 tablet (5 mg total) by mouth 5 (five) times daily. Max: 5/day   OXYCODONE (OXY IR/ROXICODONE) 5 MG IMMEDIATE RELEASE TABLET    Take 1 tablet (5 mg total) by mouth 4 (four) times daily. Max: 4/day   OXYCODONE (OXY IR/ROXICODONE) 5 MG IMMEDIATE RELEASE TABLET    Take 1 tablet (5 mg total) by mouth 3 (three) times daily. Max: 3/day   OXYCODONE (OXY IR/ROXICODONE) 5 MG IMMEDIATE RELEASE TABLET    Take 1 tablet (5 mg total) by mouth 2 (two) times daily. Max: 2/day   OXYCODONE (OXY IR/ROXICODONE) 5 MG IMMEDIATE RELEASE TABLET    Take 1 tablet (5 mg total) by mouth daily. Max: 1/day   OXYCODONE (OXY IR/ROXICODONE) 5 MG IMMEDIATE RELEASE TABLET    Take 1 tablet (5 mg total) by mouth 5 (five) times daily. Max: 5/day   OXYCODONE (OXY IR/ROXICODONE) 5 MG IMMEDIATE RELEASE TABLET    Take 1 tablet (5 mg total) by mouth 4 (four) times daily. Max: 4/day   OXYCODONE (OXY IR/ROXICODONE) 5 MG IMMEDIATE RELEASE TABLET    Take 1 tablet (5 mg total) by mouth 3 (three) times daily. Max: 3/day   OXYCODONE (OXY IR/ROXICODONE) 5 MG IMMEDIATE RELEASE TABLET    Take 1 tablet (5 mg total) by mouth 2 (two) times daily. Max: 2/day   OXYCODONE (OXY IR/ROXICODONE) 5 MG IMMEDIATE RELEASE TABLET    Take 1 tablet (5 mg total) by mouth  daily. Max: 1/day   OXYCODONE HCL 10 MG TABS    Take 1 tablet (10 mg total) by mouth 2 (two) times daily. Max: 2/day   Medications administered today: Mr. Gamblin had no medications administered during this visit. Lab-work, procedure(s), and/or referral(s): Orders Placed This Encounter  Procedures   Radiofrequency,Lumbar   Imaging and/or referral(s): None  Interventional therapies: Planned, scheduled, and/or pending:   Pending lumbar facet RFA on the left side, as soon as possible.    Considering:   Palliative Right Lumbar facet RFA.    Palliative PRN treatment(s):   Palliative Right Lumbar facet RFA.    Provider-requested follow-up: Return in about 3 months (around 06/11/2017) for (MD) Med-Mgmt, procedure ([facility]).  Future Appointments Date Time Provider [place]  06/11/2017 11:20 AM Vevelyn Francois, NP [facility] None  06/22/2017 10:45 AM Milinda Pointer, MD [facility] None   Primary Care Physician: [facility] Location: [facility] Facility Note by: Cobi Delph A. Dossie Arbour, M.D, DABA, DABAPM, DABPM, DABIPP, FIPP Date: 03/16/2017; Time: 4:35 PM  Pain Score Disclaimer: We use the NRS-11 scale. This is a self-reported, subjective measurement of pain severity with only modest accuracy. It is used primarily to identify changes within a particular patient. It must be understood that outpatient pain scales are significantly less accurate that those used for research, where they can be applied under ideal controlled circumstances with minimal exposure to variables. In reality, the score is likely to be a combination of pain intensity and pain affect, where pain affect describes the degree of emotional arousal or  changes in action readiness caused by the sensory experience of pain. Factors such as social and work situation, setting, emotional state, anxiety levels, expectation, and prior pain experience may influence pain perception and show large  inter-individual differences that may also be affected by time variables.  Patient instructions provided during this appointment: Patient Instructions     DRUG HOLIDAYS  Definitions Tolerance: defined as the progressively decreased responsiveness to a drug. Occurs when the drug is used repeatedly and the body adapts to the continued presence of the drug. As a result, a larger dose of the drug is needed to achieve the effect originally obtained by a smaller dose. It is thought to be due to the formation of excess opioid receptors.  Drug Holiday: is when a patient stops taking a medication(s) for a period of time; anywhere from a few days to several weeks.  Withdrawals: refers to the wide range of symptoms that occur after stopping or dramatically reducing opiate drugs after heavy and prolonged use. Withdrawal symptoms do not occur to patients that use low dose opioids, or those who take the medication sporadically. Contrary to benzodiazepine (example: Valium, Xanax, etc.) or alcohol withdrawals (Delirium Tremens), opioid withdrawals are not lethal. Withdrawals are the physical manifestation of the body getting rid of the excess receptors.  Purpose To eliminate tolerance.  Duration of Holiday 14 consecutive days. (2 weeks)  Expected Symptoms Early symptoms of withdrawal include:  Agitation  Anxiety  Muscle aches  Increased tearing  Insomnia  Runny nose  Sweating  Yawning  Late symptoms of withdrawal include:  Abdominal cramping  Diarrhea  Dilated pupils  Goose bumps  Nausea  Vomiting  Opioid withdrawal reactions are very uncomfortable but are not life-threatening. Symptoms usually start within 12 hours of last opioid dose and within 30 hours of last methadone exposure.  Duration of Symptoms 48 to 72 hours for short acting medications and 2 to 14 days for methadone.  Treatment  Clonidine (Catapres) or tizanidine (Zanaflex) for agitation, sweating,  tearing, runny nose.  Promethazine (Phenergan) for nausea, vomiting.  NSAIDs for pain.  Benefits  Improved effectiveness of opioids.  Decreased opioid dose needed to achieve benefits.  Improved pain with lesser dose.  Preparing for Procedure with Sedation Instructions:  Oral Intake: Do not eat or drink anything for at least 8 hours prior to your procedure.  Transportation: Public transportation is not allowed. Bring an adult driver. The driver must be physically present in our waiting room before any procedure can be started.  Physical Assistance: Bring an adult capable of physically assisting you, in the event you need help.  Blood Pressure Medicine: Take your blood pressure medicine with a sip of water the morning of the procedure.  Insulin: Take only  of your normal insulin dose.  Preventing infections: Shower with an antibacterial soap the morning of your procedure.  Build-up your immune system: Take 1000 mg of Vitamin C with every meal (3 times a day) the day prior to your procedure.  Pregnancy: If you are pregnant, call and cancel the procedure.  Sickness: If you have a cold, fever, or any active infections, call and cancel the procedure.  Arrival: You must be in the facility at least 30 minutes prior to your scheduled procedure.  Children: Do not bring children with you.  Dress appropriately: Bring dark clothing that you would not mind if they get stained.  Valuables: Do not bring any jewelry or valuables. Procedure appointments are reserved for interventional treatments only.  No  Prescription Refills.  No medication changes will be discussed during procedure appointments. No disability issues will be discussed.Radiofrequency Lesioning Radiofrequency lesioning is a procedure that is performed to relieve pain. The procedure is often used for back, neck, or arm pain. Radiofrequency lesioning involves the use of a machine that creates radio waves to make heat.  During the procedure, the heat is applied to the nerve that carries the pain signal. The heat damages the nerve and interferes with the pain signal. Pain relief usually starts about 2 weeks after the procedure and lasts for 6 months to 1 year. Tell a health care provider about:  Any allergies you have.  All medicines you are taking, including vitamins, herbs, eye drops, creams, and over-the-counter medicines.  Any problems you or family members have had with anesthetic medicines.  Any blood disorders you have.  Any surgeries you have had.  Any medical conditions you have.  Whether you are pregnant or may be pregnant. What are the risks? Generally, this is a safe procedure. However, problems may occur, including:  Pain or soreness at the injection site.  Infection at the injection site.  Damage to nerves or blood vessels. What happens before the procedure?  Ask your health care provider about:  Changing or stopping your regular medicines. This is especially important if you are taking diabetes medicines or blood thinners.  Taking medicines such as aspirin and ibuprofen. These medicines can thin your blood. Do not take these medicines before your procedure if your health care provider instructs you not to.  Follow instructions from your health care provider about eating or drinking restrictions.  Plan to have someone take you home after the procedure.  If you go home right after the procedure, plan to have someone with you for 24 hours. What happens during the procedure?  You will be given one or more of the following:  A medicine to help you relax (sedative).  A medicine to numb the area (local anesthetic).  You will be awake during the procedure. You will need to be able to talk with the health care provider during the procedure.  With the help of a type of X-ray (fluoroscopy), the health care provider will insert a radiofrequency needle into the area to be  treated.  Next, a wire that carries the radio waves (electrode) will be put through the radiofrequency needle. An electrical pulse will be sent through the electrode to verify the correct nerve. You will feel a tingling sensation, and you may have muscle twitching.  Then, the tissue that is around the needle tip will be heated by an electric current that is passed using the radiofrequency machine. This will numb the nerves.  A bandage (dressing) will be put on the insertion area after the procedure is done. The procedure may vary among health care providers and hospitals. What happens after the procedure?  Your blood pressure, heart rate, breathing rate, and blood oxygen level will be monitored often until the medicines you were given have worn off.  Return to your normal activities as directed by your health care provider. This information is not intended to replace advice given to you by your health care provider. Make sure you discuss any questions you have with your health care provider. Document Released: 08/13/2011 Document Revised: 05/22/2016 Document Reviewed: 01/22/2015 Elsevier Interactive Patient Education  2017 [employer].

## 2017-03-16 NOTE — Progress Notes (Signed)
Oxycodone #45 wasted in toilet with patient and [name] Friendly as witness.[facility] notified.  Pharmacist states that they have 1 refill on file.  Pharmacist was instructed to mail it back to our clinic.Pharmacist name is Snjezana.

## 2017-03-16 NOTE — Progress Notes (Signed)
Safety precautions to be maintained throughout the outpatient stay will include: orient to surroundings, keep bed in low position, maintain call bell within reach at all times, provide assistance with transfer out of bed and ambulation.  

## 2017-03-16 NOTE — Patient Instructions (Addendum)
DRUG HOLIDAYS  Definitions Tolerance: defined as the progressively decreased responsiveness to a drug. Occurs when the drug is used repeatedly and the body adapts to the continued presence of the drug. As a result, a larger dose of the drug is needed to achieve the effect originally obtained by a smaller dose. It is thought to be due to the formation of excess opioid receptors.  Drug Holiday: is when a patient stops taking a medication(s) for a period of time; anywhere from a few days to several weeks.  Withdrawals: refers to the wide range of symptoms that occur after stopping or dramatically reducing opiate drugs after heavy and prolonged use. Withdrawal symptoms do not occur to patients that use low dose opioids, or those who take the medication sporadically. Contrary to benzodiazepine (example: Valium, Xanax, etc.) or alcohol withdrawals (Delirium Tremens), opioid withdrawals are not lethal. Withdrawals are the physical manifestation of the body getting rid of the excess receptors.  Purpose To eliminate tolerance.  Duration of Holiday 14 consecutive days. (2 weeks)  Expected Symptoms Early symptoms of withdrawal include:  Agitation  Anxiety  Muscle aches  Increased tearing  Insomnia  Runny nose  Sweating  Yawning  Late symptoms of withdrawal include:  Abdominal cramping  Diarrhea  Dilated pupils  Goose bumps  Nausea  Vomiting  Opioid withdrawal reactions are very uncomfortable but are not life-threatening. Symptoms usually start within 12 hours of last opioid dose and within 30 hours of last methadone exposure.  Duration of Symptoms 48 to 72 hours for short acting medications and 2 to 14 days for methadone.  Treatment  Clonidine (Catapres) or tizanidine (Zanaflex) for agitation, sweating, tearing, runny nose.  Promethazine (Phenergan) for nausea, vomiting.  NSAIDs for pain.  Benefits  Improved effectiveness of opioids.  Decreased opioid  dose needed to achieve benefits.  Improved pain with lesser dose.  Preparing for Procedure with Sedation Instructions:  Oral Intake: Do not eat or drink anything for at least 8 hours prior to your procedure.  Transportation: Public transportation is not allowed. Bring an adult driver. The driver must be physically present in our waiting room before any procedure can be started.  Physical Assistance: Bring an adult capable of physically assisting you, in the event you need help.  Blood Pressure Medicine: Take your blood pressure medicine with a sip of water the morning of the procedure.  Insulin: Take only  of your normal insulin dose.  Preventing infections: Shower with an antibacterial soap the morning of your procedure.  Build-up your immune system: Take 1000 mg of Vitamin C with every meal (3 times a day) the day prior to your procedure.  Pregnancy: If you are pregnant, call and cancel the procedure.  Sickness: If you have a cold, fever, or any active infections, call and cancel the procedure.  Arrival: You must be in the facility at least 30 minutes prior to your scheduled procedure.  Children: Do not bring children with you.  Dress appropriately: Bring dark clothing that you would not mind if they get stained.  Valuables: Do not bring any jewelry or valuables. Procedure appointments are reserved for interventional treatments only.  No Prescription Refills.  No medication changes will be discussed during procedure appointments. No disability issues will be discussed.Radiofrequency Lesioning Radiofrequency lesioning is a procedure that is performed to relieve pain. The procedure is often used for back, neck, or arm pain. Radiofrequency lesioning involves the use of a machine that creates radio waves to make heat. During  the procedure, the heat is applied to the nerve that carries the pain signal. The heat damages the nerve and interferes with the pain signal. Pain relief  usually starts about 2 weeks after the procedure and lasts for 6 months to 1 year. Tell a health care provider about:  Any allergies you have.  All medicines you are taking, including vitamins, herbs, eye drops, creams, and over-the-counter medicines.  Any problems you or family members have had with anesthetic medicines.  Any blood disorders you have.  Any surgeries you have had.  Any medical conditions you have.  Whether you are pregnant or may be pregnant. What are the risks? Generally, this is a safe procedure. However, problems may occur, including:  Pain or soreness at the injection site.  Infection at the injection site.  Damage to nerves or blood vessels. What happens before the procedure?  Ask your health care provider about:  Changing or stopping your regular medicines. This is especially important if you are taking diabetes medicines or blood thinners.  Taking medicines such as aspirin and ibuprofen. These medicines can thin your blood. Do not take these medicines before your procedure if your health care provider instructs you not to.  Follow instructions from your health care provider about eating or drinking restrictions.  Plan to have someone take you home after the procedure.  If you go home right after the procedure, plan to have someone with you for 24 hours. What happens during the procedure?  You will be given one or more of the following:  A medicine to help you relax (sedative).  A medicine to numb the area (local anesthetic).  You will be awake during the procedure. You will need to be able to talk with the health care provider during the procedure.  With the help of a type of X-ray (fluoroscopy), the health care provider will insert a radiofrequency needle into the area to be treated.  Next, a wire that carries the radio waves (electrode) will be put through the radiofrequency needle. An electrical pulse will be sent through the electrode to  verify the correct nerve. You will feel a tingling sensation, and you may have muscle twitching.  Then, the tissue that is around the needle tip will be heated by an electric current that is passed using the radiofrequency machine. This will numb the nerves.  A bandage (dressing) will be put on the insertion area after the procedure is done. The procedure may vary among health care providers and hospitals. What happens after the procedure?  Your blood pressure, heart rate, breathing rate, and blood oxygen level will be monitored often until the medicines you were given have worn off.  Return to your normal activities as directed by your health care provider. This information is not intended to replace advice given to you by your health care provider. Make sure you discuss any questions you have with your health care provider. Document Released: 08/13/2011 Document Revised: 05/22/2016 Document Reviewed: 01/22/2015 Elsevier Interactive Patient Education  2017 [employer].

## 2017-03-18 NOTE — Telephone Encounter (Signed)
Received Prescription in mail From [facility],   For patient- Oxycodone 15mg  with fill date of 03/15/17 Called [facility]. No one at Pharm is aware of the situation and does not know why it would be mailed to [place]. He asked me to call back tomorrow and speak with the manager Vicente Males.

## 2017-03-18 NOTE — Telephone Encounter (Signed)
Talked with Willie Krueger- aware of this situation and had instructed Pharm to send Rx back. Patient is being tapered from oxycodlone. Placed Rx to be scanned.

## 2017-04-02 ENCOUNTER — Encounter: Payer: 59 | Admitting: Pain Medicine

## 2017-04-20 ENCOUNTER — Other Ambulatory Visit: Payer: Self-pay | Admitting: Pain Medicine

## 2017-04-20 ENCOUNTER — Ambulatory Visit (HOSPITAL_BASED_OUTPATIENT_CLINIC_OR_DEPARTMENT_OTHER): Payer: 59 | Admitting: Pain Medicine

## 2017-04-20 ENCOUNTER — Ambulatory Visit
Admission: RE | Admit: 2017-04-20 | Discharge: 2017-04-20 | Disposition: A | Discharge status: Home / Self Care | Payer: 59 | Source: Ambulatory Visit | Attending: Pain Medicine | Admitting: Pain Medicine

## 2017-04-20 ENCOUNTER — Encounter: Payer: Self-pay | Admitting: Pain Medicine

## 2017-04-20 VITALS — BP 129/82 | HR 78 | Temp 97.5°F | Resp 23 | Ht 74.0 in | Wt 237.0 lb

## 2017-04-20 DIAGNOSIS — M47816 Spondylosis without myelopathy or radiculopathy, lumbar region: Secondary | ICD-10-CM

## 2017-04-20 DIAGNOSIS — G8929 Other chronic pain: Secondary | ICD-10-CM | POA: Diagnosis not present

## 2017-04-20 DIAGNOSIS — M4696 Unspecified inflammatory spondylopathy, lumbar region: Secondary | ICD-10-CM | POA: Diagnosis not present

## 2017-04-20 DIAGNOSIS — M159 Polyosteoarthritis, unspecified: Secondary | ICD-10-CM

## 2017-04-20 DIAGNOSIS — M545 Low back pain, unspecified: Secondary | ICD-10-CM

## 2017-04-20 DIAGNOSIS — G8918 Other acute postprocedural pain: Secondary | ICD-10-CM

## 2017-04-20 DIAGNOSIS — M150 Primary generalized (osteo)arthritis: Principal | ICD-10-CM

## 2017-04-20 MED ORDER — FENTANYL CITRATE (PF) 100 MCG/2ML IJ SOLN
INTRAMUSCULAR | Status: AC
  Filled 2017-04-20: qty 2

## 2017-04-20 MED ORDER — LIDOCAINE HCL (PF) 1 % IJ SOLN
10.0000 mL | Freq: Once | INTRAMUSCULAR | Status: AC
  Administered 2017-04-20: 5 mL

## 2017-04-20 MED ORDER — MIDAZOLAM HCL 5 MG/5ML IJ SOLN
1.0000 mg | INTRAMUSCULAR | Status: DC | PRN
  Administered 2017-04-20: 2 mg via INTRAVENOUS

## 2017-04-20 MED ORDER — LIDOCAINE HCL (PF) 1 % IJ SOLN
INTRAMUSCULAR | Status: AC
  Filled 2017-04-20: qty 5

## 2017-04-20 MED ORDER — ROPIVACAINE HCL 2 MG/ML IJ SOLN
INTRAMUSCULAR | Status: AC
  Filled 2017-04-20: qty 10

## 2017-04-20 MED ORDER — FENTANYL CITRATE (PF) 100 MCG/2ML IJ SOLN
25.0000 ug | INTRAMUSCULAR | Status: DC | PRN
  Administered 2017-04-20: 100 ug via INTRAVENOUS

## 2017-04-20 MED ORDER — TRIAMCINOLONE ACETONIDE 40 MG/ML IJ SUSP
40.0000 mg | Freq: Once | INTRAMUSCULAR | Status: AC
  Administered 2017-04-20: 40 mg

## 2017-04-20 MED ORDER — ROPIVACAINE HCL 2 MG/ML IJ SOLN
9.0000 mL | Freq: Once | INTRAMUSCULAR | Status: AC
  Administered 2017-04-20: 9 mL via PERINEURAL

## 2017-04-20 MED ORDER — LACTATED RINGERS IV SOLN
1000.0000 mL | Freq: Once | INTRAVENOUS | Status: AC
  Administered 2017-04-20: 1000 mL via INTRAVENOUS

## 2017-04-20 MED ORDER — MIDAZOLAM HCL 5 MG/5ML IJ SOLN
INTRAMUSCULAR | Status: AC
  Filled 2017-04-20: qty 5

## 2017-04-20 MED ORDER — TRIAMCINOLONE ACETONIDE 40 MG/ML IJ SUSP
INTRAMUSCULAR | Status: AC
  Filled 2017-04-20: qty 1

## 2017-04-20 NOTE — Progress Notes (Signed)
Safety precautions to be maintained throughout the outpatient stay will include: orient to surroundings, keep bed in low position, maintain call bell within reach at all times, provide assistance with transfer out of bed and ambulation.  Patient was given discharge instructions; Rollene Fare was called and patient was discharged via wheelchair accompanied by Charna Busman, CNA, who assisted the patient into a white pick up truck. Butch Penny said " The driver of the white pick up truck then took the patient over to a parking handicap space where the patient got out of the truck and got into a little Pearline Cables car and proceeded to drive away". Gwendolyn Fill was informed by Charna Busman and Dr. Dossie Arbour was  Informed by Cherly Anderson.Marland Kitchen

## 2017-04-20 NOTE — Patient Instructions (Addendum)
Post-Procedure instructions Instructions:  Apply ice: Fill a plastic sandwich bag with crushed ice. Cover it with a small towel and apply to injection site. Apply for 15 minutes then remove x 15 minutes. Repeat sequence on day of procedure, until you go to bed. The purpose is to minimize swelling and discomfort after procedure.  Apply heat: Apply heat to procedure site starting the day following the procedure. The purpose is to treat any soreness and discomfort from the procedure.  Food intake: Start with clear liquids (like water) and advance to regular food, as tolerated.   Physical activities: Keep activities to a minimum for the first 8 hours after the procedure.   Driving: If you have received any sedation, you are not allowed to drive for 24 hours after your procedure.  Blood thinner: Restart your blood thinner 6 hours after your procedure. (Only for those taking blood thinners)  Insulin: As soon as you can eat, you may resume your normal dosing schedule. (Only for those taking insulin)  Infection prevention: Keep procedure site clean and dry.  Post-procedure Pain Diary: Extremely important that this be done correctly and accurately. Recorded information will be used to determine the next step in treatment.  Pain evaluated is that of treated area only. Do not include pain from an untreated area.  Complete every hour, on the hour, for the initial 8 hours. Set an alarm to help you do this part accurately.  Do not go to sleep and have it completed later. It will not be accurate.  Follow-up appointment: Keep your follow-up appointment after the procedure. Usually 2 weeks for most procedures. (6 weeks in the case of radiofrequency.) Bring you pain diary.  Expect:  From numbing medicine (AKA: Local Anesthetics): Numbness or decrease in pain.  Onset: Full effect within 15 minutes of injected.  Duration: It will depend on the type of local anesthetic used. On the average, 1 to 8  hours.   From steroids: Decrease in swelling or inflammation. Once inflammation is improved, relief of the pain will follow.  Onset of benefits: Depends on the amount of swelling present. The more swelling, the longer it will take for the benefits to be seen.   Duration: Steroids will stay in the system x 2 weeks. Duration of benefits will depend on multiple posibilities including persistent irritating factors.  From procedure: Some discomfort is to be expected once the numbing medicine wears off. This should be minimal if ice and heat are applied as instructed. Call if:  You experience numbness and weakness that gets worse with time, as opposed to wearing off.  New onset bowel or bladder incontinence. (Spinal procedures only)  Emergency Numbers:  Durning business hours (Monday - Thursday, 8:00 AM - 4:00 PM) (Friday, 9:00 AM - 12:00 Noon): [phone]  After hours: [phone] _____________________________________________________________________________________________  Pain Management Discharge Instructions  General Discharge Instructions :  If you need to reach your doctor call: Monday-Friday 8:00 am - 4:00 pm at [phone] or toll free [phone].  After clinic hours [phone] to have operator reach doctor.  Bring all of your medication bottles to all your appointments in the pain clinic.  To cancel or reschedule your appointment with Pain Management please remember to call 24 hours in advance to avoid a fee.  Refer to the educational materials which you have been given on: General Risks, I had my Procedure. Discharge Instructions, Post Sedation.  Post Procedure Instructions:  The drugs you were given will stay in your system until  tomorrow, so for the next 24 hours you should not drive, make any legal decisions or drink any alcoholic beverages.  You may eat anything you prefer, but it is better to start with liquids then soups and crackers, and gradually work  up to solid foods.  Please notify your doctor immediately if you have any unusual bleeding, trouble breathing or pain that is not related to your normal pain.  Depending on the type of procedure that was done, some parts of your body may feel week and/or numb.  This usually clears up by tonight or the next day.  Walk with the use of an assistive device or accompanied by an adult for the 24 hours.  You may use ice on the affected area for the first 24 hours.  Put ice in a Ziploc bag and cover with a towel and place against area 15 minutes on 15 minutes off.  You may switch to heat after 24 hours.Radiofrequency Lesioning, Care After Refer to this sheet in the next few weeks. These instructions provide you with information about caring for yourself after your procedure. Your health care provider may also give you more specific instructions. Your treatment has been planned according to current medical practices, but problems sometimes occur. Call your health care provider if you have any problems or questions after your procedure. What can I expect after the procedure? After the procedure, it is common to have:  Pain from the burned nerve.  Temporary numbness. Follow these instructions at home:  Take over-the-counter and prescription medicines only as told by your health care provider.  Return to your normal activities as told by your health care provider. Ask your health care provider what activities are safe for you.  Pay close attention to how you feel after the procedure. If you start to have pain, write down when it hurts and how it feels. This will help you and your health care provider to know if you need an additional treatment.  Check your needle insertion site every day for signs of infection. Watch for:  Redness, swelling, or pain.  Fluid, blood, or pus.  Keep all follow-up visits as told by your health care provider. This is important. Contact a health care provider if:  Your  pain does not get better.  You have redness, swelling, or pain at the needle insertion site.  You have fluid, blood, or pus coming from the needle insertion site.  You have a fever. Get help right away if:  You develop sudden, severe pain.  You develop numbness or tingling near the procedure site that does not go away. This information is not intended to replace advice given to you by your health care provider. Make sure you discuss any questions you have with your health care provider. Document Released: 08/14/2011 Document Revised: 05/22/2016 Document Reviewed: 01/22/2015 Elsevier Interactive Patient Education  2017 [place]  What are the risk, side effects and possible complications? Generally speaking, most procedures are safe.  However, with any procedure there are risks, side effects, and the possibility of complications.  The risks and complications are dependent upon the sites that are lesioned, or the type of nerve block to be performed.  The closer the procedure is to the spine, the more serious the risks are.  Great care is taken when placing the radio frequency needles, block needles or lesioning probes, but sometimes complications can occur. 1. Infection: Any time there is an injection through the skin, there  is a risk of infection.  This is why sterile conditions are used for these blocks.  There are four possible types of infection. 1. Localized skin infection. 2. Central Nervous System Infection-This can be in the form of Meningitis, which can be deadly. 3. Epidural Infections-This can be in the form of an epidural abscess, which can cause pressure inside of the spine, causing compression of the spinal cord with subsequent paralysis. This would require an emergency surgery to decompress, and there are no guarantees that the patient would recover from the paralysis. 4. Discitis-This is an infection of the intervertebral discs.  It occurs in  about 1% of discography procedures.  It is difficult to treat and it may lead to surgery.        2. Pain: the needles have to go through skin and soft tissues, will cause soreness.       3. Damage to internal structures:  The nerves to be lesioned may be near blood vessels or    other nerves which can be potentially damaged.       4. Bleeding: Bleeding is more common if the patient is taking blood thinners such as  aspirin, Coumadin, Ticiid, Plavix, etc., or if he/she have some genetic predisposition  such as hemophilia. Bleeding into the spinal canal can cause compression of the spinal  cord with subsequent paralysis.  This would require an emergency surgery to  decompress and there are no guarantees that the patient would recover from the  paralysis.       5. Pneumothorax:  Puncturing of a lung is a possibility, every time a needle is introduced in  the area of the chest or upper back.  Pneumothorax refers to free air around the  collapsed lung(s), inside of the thoracic cavity (chest cavity).  Another two possible  complications related to a similar event would include: Hemothorax and Chylothorax.   These are variations of the Pneumothorax, where instead of air around the collapsed  lung(s), you may have blood or chyle, respectively.       6. Spinal headaches: They may occur with any procedures in the area of the spine.       7. Persistent CSF (Cerebro-Spinal Fluid) leakage: This is a rare problem, but may occur  with prolonged intrathecal or epidural catheters either due to the formation of a fistulous  track or a dural tear.       8. Nerve damage: By working so close to the spinal cord, there is always a possibility of  nerve damage, which could be as serious as a permanent spinal cord injury with  paralysis.       9. Death:  Although rare, severe deadly allergic reactions known as "Anaphylactic  reaction" can occur to any of the medications used.      10. Worsening of the symptoms:  We can always  make thing worse.  What are the chances of something like this happening? Chances of any of this occuring are extremely low.  By statistics, you have more of a chance of getting killed in a motor vehicle accident: while driving to the hospital than any of the above occurring .  Nevertheless, you should be aware that they are possibilities.  In general, it is similar to taking a shower.  Everybody knows that you can slip, hit your head and get killed.  Does that mean that you should not shower again?  Nevertheless always keep in mind that statistics do not mean anything if  you happen to be on the wrong side of them.  Even if a procedure has a 1 (one) in a 1,000,000 (million) chance of going wrong, it you happen to be that one..Also, keep in mind that by statistics, you have more of a chance of having something go wrong when taking medications.  Who should not have this procedure? If you are on a blood thinning medication (e.g. Coumadin, Plavix, see list of "Blood Thinners"), or if you have an active infection going on, you should not have the procedure.  If you are taking any blood thinners, please inform your physician.  How should I prepare for this procedure?  Do not eat or drink anything at least six hours prior to the procedure.  Bring a driver with you .  It cannot be a taxi.  Come accompanied by an adult that can drive you back, and that is strong enough to help you if your legs get weak or numb from the local anesthetic.  Take all of your medicines the morning of the procedure with just enough water to swallow them.  If you have diabetes, make sure that you are scheduled to have your procedure done first thing in the morning, whenever possible.  If you have diabetes, take only half of your insulin dose and notify our nurse that you have done so as soon as you arrive at the clinic.  If you are diabetic, but only take blood sugar pills (oral hypoglycemic), then do not take them on the  morning of your procedure.  You may take them after you have had the procedure.  Do not take aspirin or any aspirin-containing medications, at least eleven (11) days prior to the procedure.  They may prolong bleeding.  Wear loose fitting clothing that may be easy to take off and that you would not mind if it got stained with Betadine or blood.  Do not wear any jewelry or perfume  Remove any nail coloring.  It will interfere with some of our monitoring equipment.  NOTE: Remember that this is not meant to be interpreted as a complete list of all possible complications.  Unforeseen problems may occur.  BLOOD THINNERS The following drugs contain aspirin or other products, which can cause increased bleeding during surgery and should not be taken for 2 weeks prior to and 1 week after surgery.  If you should need take something for relief of minor pain, you may take acetaminophen which is found in Tylenol,m Datril, Anacin-3 and Panadol. It is not blood thinner. The products listed below are.  Do not take any of the products listed below in addition to any listed on your instruction sheet.  A.P.C or A.P.C with Codeine Codeine Phosphate Capsules #3 Ibuprofen Ridaura  ABC compound Congesprin Imuran rimadil  Advil Cope Indocin Robaxisal  Alka-Seltzer Effervescent Pain Reliever and Antacid Coricidin or Coricidin-D  Indomethacin Rufen  Alka-Seltzer plus Cold Medicine Cosprin Ketoprofen S-A-C Tablets  Anacin Analgesic Tablets or Capsules Coumadin Korlgesic Salflex  Anacin Extra Strength Analgesic tablets or capsules CP-2 Tablets Lanoril Salicylate  Anaprox Cuprimine Capsules Levenox Salocol  Anexsia-D Dalteparin Magan Salsalate  Anodynos Darvon compound Magnesium Salicylate Sine-off  Ansaid Dasin Capsules Magsal Sodium Salicylate  Anturane Depen Capsules Marnal Soma  APF Arthritis pain formula Dewitt's Pills Measurin Stanback  Argesic Dia-Gesic Meclofenamic Sulfinpyrazone  Arthritis Bayer Timed  Release Aspirin Diclofenac Meclomen Sulindac  Arthritis pain formula Anacin Dicumarol Medipren Supac  Analgesic (Safety coated) Arthralgen Diffunasal Mefanamic Suprofen  Arthritis Strength Bufferin  Dihydrocodeine Mepro Compound Suprol  Arthropan liquid Dopirydamole Methcarbomol with Aspirin Synalgos  ASA tablets/Enseals Disalcid Micrainin Tagament  Ascriptin Doan's Midol Talwin  Ascriptin A/D Dolene Mobidin Tanderil  Ascriptin Extra Strength Dolobid Moblgesic Ticlid  Ascriptin with Codeine Doloprin or Doloprin with Codeine Momentum Tolectin  Asperbuf Duoprin Mono-gesic Trendar  Aspergum Duradyne Motrin or Motrin IB Triminicin  Aspirin plain, buffered or enteric coated Durasal Myochrisine Trigesic  Aspirin Suppositories Easprin Nalfon Trillsate  Aspirin with Codeine Ecotrin Regular or Extra Strength Naprosyn Uracel  Atromid-S Efficin Naproxen Ursinus  Auranofin Capsules Elmiron Neocylate Vanquish  Axotal Emagrin Norgesic Verin  Azathioprine Empirin or Empirin with Codeine Normiflo Vitamin E  Azolid Emprazil Nuprin Voltaren  Bayer Aspirin plain, buffered or children's or timed BC Tablets or powders Encaprin Orgaran Warfarin Sodium  Buff-a-Comp Enoxaparin Orudis Zorpin  Buff-a-Comp with Codeine Equegesic Os-Cal-Gesic   Buffaprin Excedrin plain, buffered or Extra Strength Oxalid   Bufferin Arthritis Strength Feldene Oxphenbutazone   Bufferin plain or Extra Strength Feldene Capsules Oxycodone with Aspirin   Bufferin with Codeine Fenoprofen Fenoprofen Pabalate or Pabalate-SF   Buffets II Flogesic Panagesic   Buffinol plain or Extra Strength Florinal or Florinal with Codeine Panwarfarin   Buf-Tabs Flurbiprofen Penicillamine   Butalbital Compound Four-way cold tablets Penicillin   Butazolidin Fragmin Pepto-Bismol   Carbenicillin Geminisyn Percodan   Carna Arthritis Reliever Geopen Persantine   Carprofen Gold's salt Persistin   Chloramphenicol Goody's Phenylbutazone   Chloromycetin  [place]altrain Piroxlcam   Clmetidine heparin Plaquenil   Cllnoril Hyco-pap Ponstel   Clofibrate Hydroxy chloroquine Propoxyphen         Before stopping any of these medications, be sure to consult the physician who ordered them.  Some, such as Coumadin (Warfarin) are ordered to prevent or treat serious conditions such as "deep thrombosis", "pumonary embolisms", and other heart problems.  The amount of time that you may need off of the medication may also vary with the medication and the reason for which you were taking it.  If you are taking any of these medications, please make sure you notify your pain physician before you undergo any procedures.         Radiofrequency Lesioning Radiofrequency lesioning is a procedure that is performed to relieve pain. The procedure is often used for back, neck, or arm pain. Radiofrequency lesioning involves the use of a machine that creates radio waves to make heat. During the procedure, the heat is applied to the nerve that carries the pain signal. The heat damages the nerve and interferes with the pain signal. Pain relief usually starts about 2 weeks after the procedure and lasts for 6 months to 1 year. Tell a health care provider about:  Any allergies you have.  All medicines you are taking, including vitamins, herbs, eye drops, creams, and over-the-counter medicines.  Any problems you or family members have had with anesthetic medicines.  Any blood disorders you have.  Any surgeries you have had.  Any medical conditions you have.  Whether you are pregnant or may be pregnant. What are the risks? Generally, this is a safe procedure. However, problems may occur, including:  Pain or soreness at the injection site.  Infection at the injection site.  Damage to nerves or blood vessels. What happens before the procedure?  Ask your health care provider about:  Changing or stopping your regular medicines. This is especially important if you are  taking diabetes medicines or blood thinners.  Taking medicines such as aspirin and ibuprofen. These medicines can  thin your blood. Do not take these medicines before your procedure if your health care provider instructs you not to.  Follow instructions from your health care provider about eating or drinking restrictions.  Plan to have someone take you home after the procedure.  If you go home right after the procedure, plan to have someone with you for 24 hours. What happens during the procedure?  You will be given one or more of the following:  A medicine to help you relax (sedative).  A medicine to numb the area (local anesthetic).  You will be awake during the procedure. You will need to be able to talk with the health care provider during the procedure.  With the help of a type of X-ray (fluoroscopy), the health care provider will insert a radiofrequency needle into the area to be treated.  Next, a wire that carries the radio waves (electrode) will be put through the radiofrequency needle. An electrical pulse will be sent through the electrode to verify the correct nerve. You will feel a tingling sensation, and you may have muscle twitching.  Then, the tissue that is around the needle tip will be heated by an electric current that is passed using the radiofrequency machine. This will numb the nerves.  A bandage (dressing) will be put on the insertion area after the procedure is done. The procedure may vary among health care providers and hospitals. What happens after the procedure?  Your blood pressure, heart rate, breathing rate, and blood oxygen level will be monitored often until the medicines you were given have worn off.  Return to your normal activities as directed by your health care provider. This information is not intended to replace advice given to you by your health care provider. Make sure you discuss any questions you have with your health care provider. Document  Released: 08/13/2011 Document Revised: 05/22/2016 Document Reviewed: 01/22/2015 Elsevier Interactive Patient Education  2017 [place]  What are the risk, side effects and possible complications? Generally speaking, most procedures are safe.  However, with any procedure there are risks, side effects, and the possibility of complications.  The risks and complications are dependent upon the sites that are lesioned, or the type of nerve block to be performed.  The closer the procedure is to the spine, the more serious the risks are.  Great care is taken when placing the radio frequency needles, block needles or lesioning probes, but sometimes complications can occur. 2. Infection: Any time there is an injection through the skin, there is a risk of infection.  This is why sterile conditions are used for these blocks.  There are four possible types of infection. 1. Localized skin infection. 2. Central Nervous System Infection-This can be in the form of Meningitis, which can be deadly. 3. Epidural Infections-This can be in the form of an epidural abscess, which can cause pressure inside of the spine, causing compression of the spinal cord with subsequent paralysis. This would require an emergency surgery to decompress, and there are no guarantees that the patient would recover from the paralysis. 4. Discitis-This is an infection of the intervertebral discs.  It occurs in about 1% of discography procedures.  It is difficult to treat and it may lead to surgery.        2. Pain: the needles have to go through skin and soft tissues, will cause soreness.       3. Damage to internal structures:  The nerves to be lesioned  may be near blood vessels or    other nerves which can be potentially damaged.       4. Bleeding: Bleeding is more common if the patient is taking blood thinners such as  aspirin, Coumadin, Ticiid, Plavix, etc., or if he/she have some genetic predisposition   such as hemophilia. Bleeding into the spinal canal can cause compression of the spinal  cord with subsequent paralysis.  This would require an emergency surgery to  decompress and there are no guarantees that the patient would recover from the  paralysis.       5. Pneumothorax:  Puncturing of a lung is a possibility, every time a needle is introduced in  the area of the chest or upper back.  Pneumothorax refers to free air around the  collapsed lung(s), inside of the thoracic cavity (chest cavity).  Another two possible  complications related to a similar event would include: Hemothorax and Chylothorax.   These are variations of the Pneumothorax, where instead of air around the collapsed  lung(s), you may have blood or chyle, respectively.       6. Spinal headaches: They may occur with any procedures in the area of the spine.       7. Persistent CSF (Cerebro-Spinal Fluid) leakage: This is a rare problem, but may occur  with prolonged intrathecal or epidural catheters either due to the formation of a fistulous  track or a dural tear.       8. Nerve damage: By working so close to the spinal cord, there is always a possibility of  nerve damage, which could be as serious as a permanent spinal cord injury with  paralysis.       9. Death:  Although rare, severe deadly allergic reactions known as "Anaphylactic  reaction" can occur to any of the medications used.      10. Worsening of the symptoms:  We can always make thing worse.  What are the chances of something like this happening? Chances of any of this occuring are extremely low.  By statistics, you have more of a chance of getting killed in a motor vehicle accident: while driving to the hospital than any of the above occurring .  Nevertheless, you should be aware that they are possibilities.  In general, it is similar to taking a shower.  Everybody knows that you can slip, hit your head and get killed.  Does that mean that you should not shower again?   Nevertheless always keep in mind that statistics do not mean anything if you happen to be on the wrong side of them.  Even if a procedure has a 1 (one) in a 1,000,000 (million) chance of going wrong, it you happen to be that one..Also, keep in mind that by statistics, you have more of a chance of having something go wrong when taking medications.  Who should not have this procedure? If you are on a blood thinning medication (e.g. Coumadin, Plavix, see list of "Blood Thinners"), or if you have an active infection going on, you should not have the procedure.  If you are taking any blood thinners, please inform your physician.  How should I prepare for this procedure?  Do not eat or drink anything at least six hours prior to the procedure.  Bring a driver with you .  It cannot be a taxi.  Come accompanied by an adult that can drive you back, and that is strong enough to help you if your legs get  weak or numb from the local anesthetic.  Take all of your medicines the morning of the procedure with just enough water to swallow them.  If you have diabetes, make sure that you are scheduled to have your procedure done first thing in the morning, whenever possible.  If you have diabetes, take only half of your insulin dose and notify our nurse that you have done so as soon as you arrive at the clinic.  If you are diabetic, but only take blood sugar pills (oral hypoglycemic), then do not take them on the morning of your procedure.  You may take them after you have had the procedure.  Do not take aspirin or any aspirin-containing medications, at least eleven (11) days prior to the procedure.  They may prolong bleeding.  Wear loose fitting clothing that may be easy to take off and that you would not mind if it got stained with Betadine or blood.  Do not wear any jewelry or perfume  Remove any nail coloring.  It will interfere with some of our monitoring equipment.  NOTE: Remember that this is not  meant to be interpreted as a complete list of all possible complications.  Unforeseen problems may occur.  BLOOD THINNERS The following drugs contain aspirin or other products, which can cause increased bleeding during surgery and should not be taken for 2 weeks prior to and 1 week after surgery.  If you should need take something for relief of minor pain, you may take acetaminophen which is found in Tylenol,m Datril, Anacin-3 and Panadol. It is not blood thinner. The products listed below are.  Do not take any of the products listed below in addition to any listed on your instruction sheet.  A.P.C or A.P.C with Codeine Codeine Phosphate Capsules #3 Ibuprofen Ridaura  ABC compound Congesprin Imuran rimadil  Advil Cope Indocin Robaxisal  Alka-Seltzer Effervescent Pain Reliever and Antacid Coricidin or Coricidin-D  Indomethacin Rufen  Alka-Seltzer plus Cold Medicine Cosprin Ketoprofen S-A-C Tablets  Anacin Analgesic Tablets or Capsules Coumadin Korlgesic Salflex  Anacin Extra Strength Analgesic tablets or capsules CP-2 Tablets Lanoril Salicylate  Anaprox Cuprimine Capsules Levenox Salocol  Anexsia-D Dalteparin Magan Salsalate  Anodynos Darvon compound Magnesium Salicylate Sine-off  Ansaid Dasin Capsules Magsal Sodium Salicylate  Anturane Depen Capsules Marnal Soma  APF Arthritis pain formula Dewitt's Pills Measurin Stanback  Argesic Dia-Gesic Meclofenamic Sulfinpyrazone  Arthritis Bayer Timed Release Aspirin Diclofenac Meclomen Sulindac  Arthritis pain formula Anacin Dicumarol Medipren Supac  Analgesic (Safety coated) Arthralgen Diffunasal Mefanamic Suprofen  Arthritis Strength Bufferin Dihydrocodeine Mepro Compound Suprol  Arthropan liquid Dopirydamole Methcarbomol with Aspirin Synalgos  ASA tablets/Enseals Disalcid Micrainin Tagament  Ascriptin Doan's Midol Talwin  Ascriptin A/D Dolene Mobidin Tanderil  Ascriptin Extra Strength Dolobid Moblgesic Ticlid  Ascriptin with Codeine Doloprin or  Doloprin with Codeine Momentum Tolectin  Asperbuf Duoprin Mono-gesic Trendar  Aspergum Duradyne Motrin or Motrin IB Triminicin  Aspirin plain, buffered or enteric coated Durasal Myochrisine Trigesic  Aspirin Suppositories Easprin Nalfon Trillsate  Aspirin with Codeine Ecotrin Regular or Extra Strength Naprosyn Uracel  Atromid-S Efficin Naproxen Ursinus  Auranofin Capsules Elmiron Neocylate Vanquish  Axotal Emagrin Norgesic Verin  Azathioprine Empirin or Empirin with Codeine Normiflo Vitamin E  Azolid Emprazil Nuprin Voltaren  Bayer Aspirin plain, buffered or children's or timed BC Tablets or powders Encaprin Orgaran Warfarin Sodium  Buff-a-Comp Enoxaparin Orudis Zorpin  Buff-a-Comp with Codeine Equegesic Os-Cal-Gesic   Buffaprin Excedrin plain, buffered or Extra Strength Oxalid   Bufferin Arthritis Strength Feldene Oxphenbutazone  Bufferin plain or Extra Strength Feldene Capsules Oxycodone with Aspirin   Bufferin with Codeine Fenoprofen Fenoprofen Pabalate or Pabalate-SF   Buffets II Flogesic Panagesic   Buffinol plain or Extra Strength Florinal or Florinal with Codeine Panwarfarin   Buf-Tabs Flurbiprofen Penicillamine   Butalbital Compound Four-way cold tablets Penicillin   Butazolidin Fragmin Pepto-Bismol   Carbenicillin Geminisyn Percodan   Carna Arthritis Reliever Geopen Persantine   Carprofen Gold's salt Persistin   Chloramphenicol Goody's Phenylbutazone   Chloromycetin Haltrain Piroxlcam   Clmetidine heparin Plaquenil   Cllnoril Hyco-pap Ponstel   Clofibrate Hydroxy chloroquine Propoxyphen         Before stopping any of these medications, be sure to consult the physician who ordered them.  Some, such as Coumadin (Warfarin) are ordered to prevent or treat serious conditions such as "deep thrombosis", "pumonary embolisms", and other heart problems.  The amount of time that you may need off of the medication may also vary with the medication and the reason for which you were  taking it.  If you are taking any of these medications, please make sure you notify your pain physician before you undergo any procedures.

## 2017-04-20 NOTE — Progress Notes (Signed)
Patient's Name: Willie Krueger  MRN: [id number]  Referring Provider: Milinda Pointer, MD  DOB: 07/18/1956  PCP: [facility]  DOS: 04/20/2017  Note by: Kathlen Brunswick. Dossie Arbour, MD  Service setting: Ambulatory outpatient  Location: [facility] (AMB) Pain Management Facility  Visit type: Procedure  Specialty: Interventional Pain Management  Patient type: Established   Primary Reason for Visit: Interventional Pain Management Treatment. CC: Back Pain (left, lower)  Procedure:  Anesthesia, Analgesia, Anxiolysis:  Type: Therapeutic Medial Branch Facet Radiofrequency Ablation Region: Lumbar Level: L2, L3, L4, L5, & S1 Medial Branch Level(s) Laterality: Left-Sided  Type: Local Anesthesia with Moderate (Conscious) Sedation Local Anesthetic: Lidocaine 1% Route: Intravenous (IV) IV Access: Secured Sedation: Meaningful verbal contact was maintained at all times during the procedure  Indication(s): Analgesia and Anxiety  Indications: 1. Lumbar facet syndrome (Location of Primary Source of Pain) (Bilateral) (R>L)   2. Lumbar spondylosis   3. Chronic low back pain (Location of Primary Source of Pain) (Midline pain) (R>L)   4. Acute postoperative pain    Willie Krueger has either failed to respond, was unable to tolerate, or simply did not get enough benefit from other more conservative therapies including, but not limited to: 1. Over-the-counter medications 2. Anti-inflammatory medications 3. Muscle relaxants 4. Membrane stabilizers 5. Opioids 6. Physical therapy 7. Modalities (Heat, ice, etc.) 8. Invasive techniques such as nerve blocks. Willie Krueger has attained more than 50% relief of the pain from a series of diagnostic injections conducted in separate occasions.  Pain Score: Pre-procedure: 7 /10 Post-procedure: 0-No pain/10  Pre-op Assessment:  Previous date of service: 03/16/17 Service provided: Evaluation Willie Krueger is a 61 y.o. (year old), male patient, seen today for interventional  treatment. He  has a past surgical history that includes Carpal tunnel release; Spine surgery; Irrigation and debridement foot (Left, 10/13/2016); Amputation toe (Left, 10/2016); PICC LINE PLACE PERIPHERAL ([place] HX) (Left, [id number]); and Foot surgery (Left, 09/2016). His primarily concern today is the Back Pain (left, lower)  Initial Vital Signs: There were no vitals taken for this visit. BMI: 30.43 kg/m  Risk Assessment: Allergies: Reviewed. He is allergic to cleocin [clindamycin]; clindamycin phosphate; other; sulfa antibiotics; and zosyn [piperacillin sod-tazobactam so].  Allergy Precautions: None required Coagulopathies: "Reviewed. None identified.  Blood-thinner therapy: None at this time Active Infection(s): Reviewed. None identified. Willie Krueger is afebrile  Site Confirmation: Willie Krueger was asked to confirm the procedure and laterality before marking the site Procedure checklist: Completed Consent: Before the procedure and under the influence of no sedative(s), amnesic(s), or anxiolytics, the patient was informed of the treatment options, risks and possible complications. To fulfill our ethical and legal obligations, as recommended by the American Medical Association's Code of Ethics, I have informed the patient of my clinical impression; the nature and purpose of the treatment or procedure; the risks, benefits, and possible complications of the intervention; the alternatives, including doing nothing; the risk(s) and benefit(s) of the alternative treatment(s) or procedure(s); and the risk(s) and benefit(s) of doing nothing. The patient was provided information about the general risks and possible complications associated with the procedure. These may include, but are not limited to: failure to achieve desired goals, infection, bleeding, organ or nerve damage, allergic reactions, paralysis, and death. In addition, the patient was informed of those risks and complications associated to  Spine-related procedures, such as failure to decrease pain; infection (i.e.: Meningitis, epidural or intraspinal abscess); bleeding (i.e.: epidural hematoma, subarachnoid hemorrhage, or any other type of intraspinal or peri-dural bleeding); organ  or nerve damage (i.e.: Any type of peripheral nerve, nerve root, or spinal cord injury) with subsequent damage to sensory, motor, and/or autonomic systems, resulting in permanent pain, numbness, and/or weakness of one or several areas of the body; allergic reactions; (i.e.: anaphylactic reaction); and/or death. Furthermore, the patient was informed of those risks and complications associated with the medications. These include, but are not limited to: allergic reactions (i.e.: anaphylactic or anaphylactoid reaction(s)); adrenal axis suppression; blood sugar elevation that in diabetics may result in ketoacidosis or comma; water retention that in patients with history of congestive heart failure may result in shortness of breath, pulmonary edema, and decompensation with resultant heart failure; weight gain; swelling or edema; medication-induced neural toxicity; particulate matter embolism and blood vessel occlusion with resultant organ, and/or nervous system infarction; and/or aseptic necrosis of one or more joints. Finally, the patient was informed that Medicine is not an exact science; therefore, there is also the possibility of unforeseen or unpredictable risks and/or possible complications that may result in a catastrophic outcome. The patient indicated having understood very clearly. We have given the patient no guarantees and we have made no promises. Enough time was given to the patient to ask questions, all of which were answered to the patient's satisfaction. Willie Krueger has indicated that he wanted to continue with the procedure. Attestation: I, the ordering provider, attest that I have discussed with the patient the benefits, risks, side-effects, alternatives,  likelihood of achieving goals, and potential problems during recovery for the procedure that I have provided informed consent. Date: 04/20/2017; Time: 11:34 AM  Pre-Procedure Preparation:  Monitoring: As per clinic protocol. Respiration, ETCO2, SpO2, BP, heart rate and rhythm monitor placed and checked for adequate function Safety Precautions: Patient was assessed for positional comfort and pressure points before starting the procedure. Time-out: I initiated and conducted the "Time-out" before starting the procedure, as per protocol. The patient was asked to participate by confirming the accuracy of the "Time Out" information. Verification of the correct person, site, and procedure were performed and confirmed by me, the nursing staff, and the patient. "Time-out" conducted as per Joint Commission's Universal Protocol (UP.01.01.01). "Time-out" Date & Time: 04/20/2017; 1454 hrs.  Description of Procedure Process:   Position: Prone Target Area: For Lumbar Facet blocks, the target is the groove formed by the junction of the transverse process and superior articular process. For the L5 dorsal ramus, the target is the notch between superior articular process and sacral ala. For the S1 dorsal ramus, the target is the superior and lateral edge of the posterior S1 Sacral foramen. Approach: Paraspinal approach. Area Prepped: Entire Posterior Lumbosacral Region Prepping solution: Hibiclens (4.0% Chlorhexidine gluconate solution) Safety Precautions: Aspiration looking for blood return was conducted prior to all injections. At no point did we inject any substances, as a needle was being advanced. No attempts were made at seeking any paresthesias. Safe injection practices and needle disposal techniques used. Medications properly checked for expiration dates. SDV (single dose vial) medications used. Description of the Procedure: Protocol guidelines were followed. The patient was placed in position over the  fluoroscopy table. The target area was identified and the area prepped in the usual manner. Skin desensitized using vapocoolant spray. Skin & deeper tissues infiltrated with local anesthetic. Appropriate amount of time allowed to pass for local anesthetics to take effect. Radiofrequency needles were introduced to the area of the medial branch at the junction of the superior articular process and transverse process using fluoroscopy. Using the [employer],  sensory stimulation using 50 Hz was used to locate & identify the nerve, making sure that the needle was positioned such that there was no sensory stimulation below 0.3 V or above 0.7 V. Stimulation using 2 Hz was used to evaluate the motor component. Care was taken not to lesion any nerves that demonstrated motor stimulation of the lower extremities at an output of less than 2.5 times that of the sensory threshold, or a maximum of 2.0 V. Once satisfactory placement of the needles was achieved, the above solution was slowly injected after negative aspiration. After waiting for at least 2 minutes, the ablation was performed at 80 degrees C for 60 seconds.The needles were then removed and the area cleansed, making sure to leave some of the prepping solution back to take advantage of its long term bactericidal properties. Vitals:   04/20/17 1529 04/20/17 1539 04/20/17 1549 04/20/17 1559  BP: (!) 143/92 135/89 138/87 129/82  Pulse: 82 85 77 78  Resp: 20 13 20  (!) 23  Temp:  97.5 F (36.4 C)    TempSrc:      SpO2: 95% 93% 94% 95%  Weight:      Height:        Start Time: 1454 hrs. End Time: 1529 hrs. Materials & Medications:  Needle(s) Type: Teflon-coated, curved tip, Radiofrequency needle(s) Gauge: 22G Length: 10cm Medication(s): We administered fentaNYL, lactated ringers, midazolam, triamcinolone acetonide, lidocaine (PF), and ropivacaine (PF) 2 mg/mL (0.2%). Please see chart orders for dosing details.  Imaging Guidance  (Spinal):  Type of Imaging Technique: Fluoroscopy Guidance (Spinal) Indication(s): Assistance in needle guidance and placement for procedures requiring needle placement in or near specific anatomical locations not easily accessible without such assistance. Exposure Time: Please see nurses notes. Contrast: None used. Fluoroscopic Guidance: I was personally present during the use of fluoroscopy. "Tunnel Vision Technique" used to obtain the best possible view of the target area. Parallax error corrected before commencing the procedure. "Direction-depth-direction" technique used to introduce the needle under continuous pulsed fluoroscopy. Once target was reached, antero-posterior, oblique, and lateral fluoroscopic projection used confirm needle placement in all planes. Images permanently stored in EMR. Interpretation: No contrast injected. I personally interpreted the imaging intraoperatively. Adequate needle placement confirmed in multiple planes. Permanent images saved into the patient's record.  Antibiotic Prophylaxis:  Indication(s): None identified Antibiotic given: None  Post-operative Assessment:  EBL: None Complications: No immediate post-treatment complications observed by team, or reported by patient. Note: The patient tolerated the entire procedure well. A repeat set of vitals were taken after the procedure and the patient was kept under observation following institutional policy, for this type of procedure. Post-procedural neurological assessment was performed, showing return to baseline, prior to discharge. The patient was provided with post-procedure discharge instructions, including a section on how to identify potential problems. Should any problems arise concerning this procedure, the patient was given instructions to immediately contact us, at any time, without hesitation. In any case, we plan to contact the patient by telephone for a follow-up status report regarding this interventional  procedure. Comments:  Unfortunately, the patient deceived [place] by getting his "ride" to take him from the entrance of the medical arts building to his car, where he got in and drove home, after having received IV sedation and knowing that it is against our policy to do this. An attempt was made at stopping him, but by the time we reached the parking lot, he had already left. Steps will be taken for this to never happen  again.  Plan of Care  Disposition: Discharge home  Discharge Date & Time: 04/20/2017; 1559 hrs.  Physician-requested Follow-up:  Return for Post-Procedure Eval, (6 wks), by MD.  Future Appointments Date Time Provider [place]  06/01/2017 1:15 PM Milinda Pointer, MD [facility] None  06/11/2017 11:20 AM Donella Stade Dorrene German, NP [facility] None   Medications ordered for procedure: Meds ordered this encounter  Medications  . fentaNYL (SUBLIMAZE) injection 25-50 mcg    Make sure Narcan is available in the pyxis when using this medication. In the event of respiratory depression (RR< 8/min): Titrate NARCAN (naloxone) in increments of 0.1 to 0.2 mg IV at 2-3 minute intervals, until desired degree of reversal.  . lactated ringers infusion 1,000 mL  . midazolam (VERSED) 5 MG/5ML injection 1-2 mg    Make sure Flumazenil is available in the pyxis when using this medication. If oversedation occurs, administer 0.2 mg IV over 15 sec. If after 45 sec no response, administer 0.2 mg again over 1 min; may repeat at 1 min intervals; not to exceed 4 doses (1 mg)  . triamcinolone acetonide (KENALOG-40) injection 40 mg  . lidocaine (PF) (XYLOCAINE) 1 % injection 10 mL  . ropivacaine (PF) 2 mg/mL (0.2%) (NAROPIN) injection 9 mL   Medications administered: We administered fentaNYL, lactated ringers, midazolam, triamcinolone acetonide, lidocaine (PF), and ropivacaine (PF) 2 mg/mL (0.2%).  See the medical record for exact dosing, route, and time of administration.  Lab-work, Procedure(s), &  Referral(s) Ordered: Orders Placed This Encounter  Procedures  . Radiofrequency,Lumbar  . DG C-Arm 1-60 Min-No Report   Imaging Ordered: Results for orders placed in visit on 02/09/17  DG C-Arm 1-60 Min-No Report   Narrative Fluoroscopy was utilized by the requesting physician.  No radiographic  interpretation.    New Prescriptions   No medications on file   Primary Care Physician: [place] [facility] Location[facility] Note by: Richie Bonanno A. Dossie Arbour, M.D, DABA, DABAPM, DABPM, DABIPP, FIPP Date: 04/20/2017; Time: 6:00 PM  Disclaimer:  Medicine is not an Chief Strategy Officer. The only guarantee in medicine is that nothing is guaranteed. It is important to note that the decision to proceed with this intervention was based on the information collected from the patient. The Data and conclusions were drawn from the patient's questionnaire, the interview, and the physical examination. Because the information was provided in large part by the patient, it cannot be guaranteed that it has not been purposely or unconsciously manipulated. Every effort has been made to obtain as much relevant data as possible for this evaluation. It is important to note that the conclusions that lead to this procedure are derived in large part from the available data. Always take into account that the treatment will also be dependent on availability of resources and existing treatment guidelines, considered by other Pain Management Practitioners as being common knowledge and practice, at the time of the intervention. For Medico-Legal purposes, it is also important to point out that variation in procedural techniques and pharmacological choices are the acceptable norm. The indications, contraindications, technique, and results of the above procedure should only be interpreted and judged by a Board-Certified Interventional Pain Specialist with extensive familiarity and expertise in the same exact  procedure and technique.  Instructions provided at this appointment: Patient Instructions   Post-Procedure instructions Instructions:  Apply ice: Fill a plastic sandwich bag with crushed ice. Cover it with a small towel and apply to injection site. Apply for 15 minutes then remove x 15  minutes. Repeat sequence on day of procedure, until you go to bed. The purpose is to minimize swelling and discomfort after procedure.  Apply heat: Apply heat to procedure site starting the day following the procedure. The purpose is to treat any soreness and discomfort from the procedure.  Food intake: Start with clear liquids (like water) and advance to regular food, as tolerated.   Physical activities: Keep activities to a minimum for the first 8 hours after the procedure.   Driving: If you have received any sedation, you are not allowed to drive for 24 hours after your procedure.  Blood thinner: Restart your blood thinner 6 hours after your procedure. (Only for those taking blood thinners)  Insulin: As soon as you can eat, you may resume your normal dosing schedule. (Only for those taking insulin)  Infection prevention: Keep procedure site clean and dry.  Post-procedure Pain Diary: Extremely important that this be done correctly and accurately. Recorded information will be used to determine the next step in treatment.  Pain evaluated is that of treated area only. Do not include pain from an untreated area.  Complete every hour, on the hour, for the initial 8 hours. Set an alarm to help you do this part accurately.  Do not go to sleep and have it completed later. It will not be accurate.  Follow-up appointment: Keep your follow-up appointment after the procedure. Usually 2 weeks for most procedures. (6 weeks in the case of radiofrequency.) Bring you pain diary.  Expect:  From numbing medicine (AKA: Local Anesthetics): Numbness or decrease in pain.  Onset: Full effect within 15 minutes of  injected.  Duration: It will depend on the type of local anesthetic used. On the average, 1 to 8 hours.   From steroids: Decrease in swelling or inflammation. Once inflammation is improved, relief of the pain will follow.  Onset of benefits: Depends on the amount of swelling present. The more swelling, the longer it will take for the benefits to be seen.   Duration: Steroids will stay in the system x 2 weeks. Duration of benefits will depend on multiple posibilities including persistent irritating factors.  From procedure: Some discomfort is to be expected once the numbing medicine wears off. This should be minimal if ice and heat are applied as instructed. Call if:  You experience numbness and weakness that gets worse with time, as opposed to wearing off.  New onset bowel or bladder incontinence. (Spinal procedures only)  Emergency Numbers:  Durning business hours (Monday - Thursday, 8:00 AM - 4:00 PM) (Friday, 9:00 AM - 12:00 Noon): [phone]  After hours: [phone] _____________________________________________________________________________________________  Pain Management Discharge Instructions  General Discharge Instructions :  If you need to reach your doctor call: Monday-Friday 8:00 am - 4:00 pm at [phone] or toll free [phone].  After clinic hours [phone] to have operator reach doctor.  Bring all of your medication bottles to all your appointments in the pain clinic.  To cancel or reschedule your appointment with Pain Management please remember to call 24 hours in advance to avoid a fee.  Refer to the educational materials which you have been given on: General Risks, I had my Procedure. Discharge Instructions, Post Sedation.  Post Procedure Instructions:  The drugs you were given will stay in your system until tomorrow, so for the next 24 hours you should not drive, make any legal decisions or drink any alcoholic beverages.  You may eat  anything you prefer, but it is better to  start with liquids then soups and crackers, and gradually work up to solid foods.  Please notify your doctor immediately if you have any unusual bleeding, trouble breathing or pain that is not related to your normal pain.  Depending on the type of procedure that was done, some parts of your body may feel week and/or numb.  This usually clears up by tonight or the next day.  Walk with the use of an assistive device or accompanied by an adult for the 24 hours.  You may use ice on the affected area for the first 24 hours.  Put ice in a Ziploc bag and cover with a towel and place against area 15 minutes on 15 minutes off.  You may switch to heat after 24 hours.Radiofrequency Lesioning, Care After Refer to this sheet in the next few weeks. These instructions provide you with information about caring for yourself after your procedure. Your health care provider may also give you more specific instructions. Your treatment has been planned according to current medical practices, but problems sometimes occur. Call your health care provider if you have any problems or questions after your procedure. What can I expect after the procedure? After the procedure, it is common to have:  Pain from the burned nerve.  Temporary numbness. Follow these instructions at home:  Take over-the-counter and prescription medicines only as told by your health care provider.  Return to your normal activities as told by your health care provider. Ask your health care provider what activities are safe for you.  Pay close attention to how you feel after the procedure. If you start to have pain, write down when it hurts and how it feels. This will help you and your health care provider to know if you need an additional treatment.  Check your needle insertion site every day for signs of infection. Watch for:  Redness, swelling, or pain.  Fluid, blood, or pus.  Keep all follow-up  visits as told by your health care provider. This is important. Contact a health care provider if:  Your pain does not get better.  You have redness, swelling, or pain at the needle insertion site.  You have fluid, blood, or pus coming from the needle insertion site.  You have a fever. Get help right away if:  You develop sudden, severe pain.  You develop numbness or tingling near the procedure site that does not go away. This information is not intended to replace advice given to you by your health care provider. Make sure you discuss any questions you have with your health care provider. Document Released: 08/14/2011 Document Revised: 05/22/2016 Document Reviewed: 01/22/2015 Elsevier Interactive Patient Education  2017 [place]  What are the risk, side effects and possible complications? Generally speaking, most procedures are safe.  However, with any procedure there are risks, side effects, and the possibility of complications.  The risks and complications are dependent upon the sites that are lesioned, or the type of nerve block to be performed.  The closer the procedure is to the spine, the more serious the risks are.  Great care is taken when placing the radio frequency needles, block needles or lesioning probes, but sometimes complications can occur. 1. Infection: Any time there is an injection through the skin, there is a risk of infection.  This is why sterile conditions are used for these blocks.  There are four possible types of infection. 1. Localized skin infection. 2. Central Nervous System Infection-This  can be in the form of Meningitis, which can be deadly. 3. Epidural Infections-This can be in the form of an epidural abscess, which can cause pressure inside of the spine, causing compression of the spinal cord with subsequent paralysis. This would require an emergency surgery to decompress, and there are no guarantees that the patient  would recover from the paralysis. 4. Discitis-This is an infection of the intervertebral discs.  It occurs in about 1% of discography procedures.  It is difficult to treat and it may lead to surgery.        2. Pain: the needles have to go through skin and soft tissues, will cause soreness.       3. Damage to internal structures:  The nerves to be lesioned may be near blood vessels or    other nerves which can be potentially damaged.       4. Bleeding: Bleeding is more common if the patient is taking blood thinners such as  aspirin, Coumadin, Ticiid, Plavix, etc., or if he/she have some genetic predisposition  such as hemophilia. Bleeding into the spinal canal can cause compression of the spinal  cord with subsequent paralysis.  This would require an emergency surgery to  decompress and there are no guarantees that the patient would recover from the  paralysis.       5. Pneumothorax:  Puncturing of a lung is a possibility, every time a needle is introduced in  the area of the chest or upper back.  Pneumothorax refers to free air around the  collapsed lung(s), inside of the thoracic cavity (chest cavity).  Another two possible  complications related to a similar event would include: Hemothorax and Chylothorax.   These are variations of the Pneumothorax, where instead of air around the collapsed  lung(s), you may have blood or chyle, respectively.       6. Spinal headaches: They may occur with any procedures in the area of the spine.       7. Persistent CSF (Cerebro-Spinal Fluid) leakage: This is a rare problem, but may occur  with prolonged intrathecal or epidural catheters either due to the formation of a fistulous  track or a dural tear.       8. Nerve damage: By working so close to the spinal cord, there is always a possibility of  nerve damage, which could be as serious as a permanent spinal cord injury with  paralysis.       9. Death:  Although rare, severe deadly allergic reactions known as  "Anaphylactic  reaction" can occur to any of the medications used.      10. Worsening of the symptoms:  We can always make thing worse.  What are the chances of something like this happening? Chances of any of this occuring are extremely low.  By statistics, you have more of a chance of getting killed in a motor vehicle accident: while driving to the hospital than any of the above occurring .  Nevertheless, you should be aware that they are possibilities.  In general, it is similar to taking a shower.  Everybody knows that you can slip, hit your head and get killed.  Does that mean that you should not shower again?  Nevertheless always keep in mind that statistics do not mean anything if you happen to be on the wrong side of them.  Even if a procedure has a 1 (one) in a 1,000,000 (million) chance of going wrong, it you happen to be that  one..Also, keep in mind that by statistics, you have more of a chance of having something go wrong when taking medications.  Who should not have this procedure? If you are on a blood thinning medication (e.g. Coumadin, Plavix, see list of "Blood Thinners"), or if you have an active infection going on, you should not have the procedure.  If you are taking any blood thinners, please inform your physician.  How should I prepare for this procedure?  Do not eat or drink anything at least six hours prior to the procedure.  Bring a driver with you .  It cannot be a taxi.  Come accompanied by an adult that can drive you back, and that is strong enough to help you if your legs get weak or numb from the local anesthetic.  Take all of your medicines the morning of the procedure with just enough water to swallow them.  If you have diabetes, make sure that you are scheduled to have your procedure done first thing in the morning, whenever possible.  If you have diabetes, take only half of your insulin dose and notify our nurse that you have done so as soon as you arrive at the  clinic.  If you are diabetic, but only take blood sugar pills (oral hypoglycemic), then do not take them on the morning of your procedure.  You may take them after you have had the procedure.  Do not take aspirin or any aspirin-containing medications, at least eleven (11) days prior to the procedure.  They may prolong bleeding.  Wear loose fitting clothing that may be easy to take off and that you would not mind if it got stained with Betadine or blood.  Do not wear any jewelry or perfume  Remove any nail coloring.  It will interfere with some of our monitoring equipment.  NOTE: Remember that this is not meant to be interpreted as a complete list of all possible complications.  Unforeseen problems may occur.  BLOOD THINNERS The following drugs contain aspirin or other products, which can cause increased bleeding during surgery and should not be taken for 2 weeks prior to and 1 week after surgery.  If you should need take something for relief of minor pain, you may take acetaminophen which is found in Tylenol,m Datril, Anacin-3 and Panadol. It is not blood thinner. The products listed below are.  Do not take any of the products listed below in addition to any listed on your instruction sheet.  A.P.C or A.P.C with Codeine Codeine Phosphate Capsules #3 Ibuprofen Ridaura  ABC compound Congesprin Imuran rimadil  Advil Cope Indocin Robaxisal  Alka-Seltzer Effervescent Pain Reliever and Antacid Coricidin or Coricidin-D  Indomethacin Rufen  Alka-Seltzer plus Cold Medicine Cosprin Ketoprofen S-A-C Tablets  Anacin Analgesic Tablets or Capsules Coumadin Korlgesic Salflex  Anacin Extra Strength Analgesic tablets or capsules CP-2 Tablets Lanoril Salicylate  Anaprox Cuprimine Capsules Levenox Salocol  Anexsia-D Dalteparin Magan Salsalate  Anodynos Darvon compound Magnesium Salicylate Sine-off  Ansaid Dasin Capsules Magsal Sodium Salicylate  Anturane Depen Capsules Marnal Soma  APF Arthritis pain  formula Dewitt's Pills Measurin Stanback  Argesic Dia-Gesic Meclofenamic Sulfinpyrazone  Arthritis Bayer Timed Release Aspirin Diclofenac Meclomen Sulindac  Arthritis pain formula Anacin Dicumarol Medipren Supac  Analgesic (Safety coated) Arthralgen Diffunasal Mefanamic Suprofen  Arthritis Strength Bufferin Dihydrocodeine Mepro Compound Suprol  Arthropan liquid Dopirydamole Methcarbomol with Aspirin Synalgos  ASA tablets/Enseals Disalcid Micrainin Tagament  Ascriptin Doan's Midol Talwin  Ascriptin A/D Dolene Mobidin Tanderil  Ascriptin Extra Strength  Dolobid Moblgesic Ticlid  Ascriptin with Codeine Doloprin or Doloprin with Codeine Momentum Tolectin  Asperbuf Duoprin Mono-gesic Trendar  Aspergum Duradyne Motrin or Motrin IB Triminicin  Aspirin plain, buffered or enteric coated Durasal Myochrisine Trigesic  Aspirin Suppositories Easprin Nalfon Trillsate  Aspirin with Codeine Ecotrin Regular or Extra Strength Naprosyn Uracel  Atromid-S Efficin Naproxen Ursinus  Auranofin Capsules Elmiron Neocylate Vanquish  Axotal Emagrin Norgesic Verin  Azathioprine Empirin or Empirin with Codeine Normiflo Vitamin E  Azolid Emprazil Nuprin Voltaren  Bayer Aspirin plain, buffered or children's or timed BC Tablets or powders Encaprin Orgaran Warfarin Sodium  Buff-a-Comp Enoxaparin Orudis Zorpin  Buff-a-Comp with Codeine Equegesic Os-Cal-Gesic   Buffaprin Excedrin plain, buffered or Extra Strength Oxalid   Bufferin Arthritis Strength Feldene Oxphenbutazone   Bufferin plain or Extra Strength Feldene Capsules Oxycodone with Aspirin   Bufferin with Codeine Fenoprofen Fenoprofen Pabalate or Pabalate-SF   Buffets II Flogesic Panagesic   Buffinol plain or Extra Strength Florinal or Florinal with Codeine Panwarfarin   Buf-Tabs Flurbiprofen Penicillamine   Butalbital Compound Four-way cold tablets Penicillin   Butazolidin Fragmin Pepto-Bismol   Carbenicillin Geminisyn Percodan   Carna Arthritis Reliever Geopen  Persantine   Carprofen Gold's salt Persistin   Chloramphenicol Goody's Phenylbutazone   Chloromycetin Haltrain Piroxlcam   Clmetidine heparin Plaquenil   Cllnoril Hyco-pap Ponstel   Clofibrate Hydroxy chloroquine Propoxyphen         Before stopping any of these medications, be sure to consult the physician who ordered them.  Some, such as Coumadin (Warfarin) are ordered to prevent or treat serious conditions such as "deep thrombosis", "pumonary embolisms", and other heart problems.  The amount of time that you may need off of the medication may also vary with the medication and the reason for which you were taking it.  If you are taking any of these medications, please make sure you notify your pain physician before you undergo any procedures.         Radiofrequency Lesioning Radiofrequency lesioning is a procedure that is performed to relieve pain. The procedure is often used for back, neck, or arm pain. Radiofrequency lesioning involves the use of a machine that creates radio waves to make heat. During the procedure, the heat is applied to the nerve that carries the pain signal. The heat damages the nerve and interferes with the pain signal. Pain relief usually starts about 2 weeks after the procedure and lasts for 6 months to 1 year. Tell a health care provider about:  Any allergies you have.  All medicines you are taking, including vitamins, herbs, eye drops, creams, and over-the-counter medicines.  Any problems you or family members have had with anesthetic medicines.  Any blood disorders you have.  Any surgeries you have had.  Any medical conditions you have.  Whether you are pregnant or may be pregnant. What are the risks? Generally, this is a safe procedure. However, problems may occur, including:  Pain or soreness at the injection site.  Infection at the injection site.  Damage to nerves or blood vessels. What happens before the procedure?  Ask your health care  provider about:  Changing or stopping your regular medicines. This is especially important if you are taking diabetes medicines or blood thinners.  Taking medicines such as aspirin and ibuprofen. These medicines can thin your blood. Do not take these medicines before your procedure if your health care provider instructs you not to.  Follow instructions from your health care provider about eating or drinking restrictions.  Plan to have someone take you home after the procedure.  If you go home right after the procedure, plan to have someone with you for 24 hours. What happens during the procedure?  You will be given one or more of the following:  A medicine to help you relax (sedative).  A medicine to numb the area (local anesthetic).  You will be awake during the procedure. You will need to be able to talk with the health care provider during the procedure.  With the help of a type of X-ray (fluoroscopy), the health care provider will insert a radiofrequency needle into the area to be treated.  Next, a wire that carries the radio waves (electrode) will be put through the radiofrequency needle. An electrical pulse will be sent through the electrode to verify the correct nerve. You will feel a tingling sensation, and you may have muscle twitching.  Then, the tissue that is around the needle tip will be heated by an electric current that is passed using the radiofrequency machine. This will numb the nerves.  A bandage (dressing) will be put on the insertion area after the procedure is done. The procedure may vary among health care providers and hospitals. What happens after the procedure?  Your blood pressure, heart rate, breathing rate, and blood oxygen level will be monitored often until the medicines you were given have worn off.  Return to your normal activities as directed by your health care provider. This information is not intended to replace advice given to you by your health  care provider. Make sure you discuss any questions you have with your health care provider. Document Released: 08/13/2011 Document Revised: 05/22/2016 Document Reviewed: 01/22/2015 Elsevier Interactive Patient Education  2017 [place]  What are the risk, side effects and possible complications? Generally speaking, most procedures are safe.  However, with any procedure there are risks, side effects, and the possibility of complications.  The risks and complications are dependent upon the sites that are lesioned, or the type of nerve block to be performed.  The closer the procedure is to the spine, the more serious the risks are.  Great care is taken when placing the radio frequency needles, block needles or lesioning probes, but sometimes complications can occur. 2. Infection: Any time there is an injection through the skin, there is a risk of infection.  This is why sterile conditions are used for these blocks.  There are four possible types of infection. 1. Localized skin infection. 2. Central Nervous System Infection-This can be in the form of Meningitis, which can be deadly. 3. Epidural Infections-This can be in the form of an epidural abscess, which can cause pressure inside of the spine, causing compression of the spinal cord with subsequent paralysis. This would require an emergency surgery to decompress, and there are no guarantees that the patient would recover from the paralysis. 4. Discitis-This is an infection of the intervertebral discs.  It occurs in about 1% of discography procedures.  It is difficult to treat and it may lead to surgery.        2. Pain: the needles have to go through skin and soft tissues, will cause soreness.       3. Damage to internal structures:  The nerves to be lesioned may be near blood vessels or    other nerves which can be potentially damaged.       4. Bleeding: Bleeding is more common if the patient is taking blood  thinners such as  aspirin, Coumadin, Ticiid, Plavix, etc., or if he/she have some genetic predisposition  such as hemophilia. Bleeding into the spinal canal can cause compression of the spinal  cord with subsequent paralysis.  This would require an emergency surgery to  decompress and there are no guarantees that the patient would recover from the  paralysis.       5. Pneumothorax:  Puncturing of a lung is a possibility, every time a needle is introduced in  the area of the chest or upper back.  Pneumothorax refers to free air around the  collapsed lung(s), inside of the thoracic cavity (chest cavity).  Another two possible  complications related to a similar event would include: Hemothorax and Chylothorax.   These are variations of the Pneumothorax, where instead of air around the collapsed  lung(s), you may have blood or chyle, respectively.       6. Spinal headaches: They may occur with any procedures in the area of the spine.       7. Persistent CSF (Cerebro-Spinal Fluid) leakage: This is a rare problem, but may occur  with prolonged intrathecal or epidural catheters either due to the formation of a fistulous  track or a dural tear.       8. Nerve damage: By working so close to the spinal cord, there is always a possibility of  nerve damage, which could be as serious as a permanent spinal cord injury with  paralysis.       9. Death:  Although rare, severe deadly allergic reactions known as "Anaphylactic  reaction" can occur to any of the medications used.      10. Worsening of the symptoms:  We can always make thing worse.  What are the chances of something like this happening? Chances of any of this occuring are extremely low.  By statistics, you have more of a chance of getting killed in a motor vehicle accident: while driving to the hospital than any of the above occurring .  Nevertheless, you should be aware that they are possibilities.  In general, it is similar to taking a shower.  Everybody knows  that you can slip, hit your head and get killed.  Does that mean that you should not shower again?  Nevertheless always keep in mind that statistics do not mean anything if you happen to be on the wrong side of them.  Even if a procedure has a 1 (one) in a 1,000,000 (million) chance of going wrong, it you happen to be that one..Also, keep in mind that by statistics, you have more of a chance of having something go wrong when taking medications.  Who should not have this procedure? If you are on a blood thinning medication (e.g. Coumadin, Plavix, see list of "Blood Thinners"), or if you have an active infection going on, you should not have the procedure.  If you are taking any blood thinners, please inform your physician.  How should I prepare for this procedure?  Do not eat or drink anything at least six hours prior to the procedure.  Bring a driver with you .  It cannot be a taxi.  Come accompanied by an adult that can drive you back, and that is strong enough to help you if your legs get weak or numb from the local anesthetic.  Take all of your medicines the morning of the procedure with just enough water to swallow them.  If you have diabetes, make sure that you are  scheduled to have your procedure done first thing in the morning, whenever possible.  If you have diabetes, take only half of your insulin dose and notify our nurse that you have done so as soon as you arrive at the clinic.  If you are diabetic, but only take blood sugar pills (oral hypoglycemic), then do not take them on the morning of your procedure.  You may take them after you have had the procedure.  Do not take aspirin or any aspirin-containing medications, at least eleven (11) days prior to the procedure.  They may prolong bleeding.  Wear loose fitting clothing that may be easy to take off and that you would not mind if it got stained with Betadine or blood.  Do not wear any jewelry or perfume  Remove any nail  coloring.  It will interfere with some of our monitoring equipment.  NOTE: Remember that this is not meant to be interpreted as a complete list of all possible complications.  Unforeseen problems may occur.  BLOOD THINNERS The following drugs contain aspirin or other products, which can cause increased bleeding during surgery and should not be taken for 2 weeks prior to and 1 week after surgery.  If you should need take something for relief of minor pain, you may take acetaminophen which is found in Tylenol,m Datril, Anacin-3 and Panadol. It is not blood thinner. The products listed below are.  Do not take any of the products listed below in addition to any listed on your instruction sheet.  A.P.C or A.P.C with Codeine Codeine Phosphate Capsules #3 Ibuprofen Ridaura  ABC compound Congesprin Imuran rimadil  Advil Cope Indocin Robaxisal  Alka-Seltzer Effervescent Pain Reliever and Antacid Coricidin or Coricidin-D  Indomethacin Rufen  Alka-Seltzer plus Cold Medicine Cosprin Ketoprofen S-A-C Tablets  Anacin Analgesic Tablets or Capsules Coumadin Korlgesic Salflex  Anacin Extra Strength Analgesic tablets or capsules CP-2 Tablets Lanoril Salicylate  Anaprox Cuprimine Capsules Levenox Salocol  Anexsia-D Dalteparin Magan Salsalate  Anodynos Darvon compound Magnesium Salicylate Sine-off  Ansaid Dasin Capsules Magsal Sodium Salicylate  Anturane Depen Capsules Marnal Soma  APF Arthritis pain formula Dewitt's Pills Measurin Stanback  Argesic Dia-Gesic Meclofenamic Sulfinpyrazone  Arthritis Bayer Timed Release Aspirin Diclofenac Meclomen Sulindac  Arthritis pain formula Anacin Dicumarol Medipren Supac  Analgesic (Safety coated) Arthralgen Diffunasal Mefanamic Suprofen  Arthritis Strength Bufferin Dihydrocodeine Mepro Compound Suprol  Arthropan liquid Dopirydamole Methcarbomol with Aspirin Synalgos  ASA tablets/Enseals Disalcid Micrainin Tagament  Ascriptin Doan's Midol Talwin  Ascriptin A/D Dolene  Mobidin Tanderil  Ascriptin Extra Strength Dolobid Moblgesic Ticlid  Ascriptin with Codeine Doloprin or Doloprin with Codeine Momentum Tolectin  Asperbuf Duoprin Mono-gesic Trendar  Aspergum Duradyne Motrin or Motrin IB Triminicin  Aspirin plain, buffered or enteric coated Durasal Myochrisine Trigesic  Aspirin Suppositories Easprin Nalfon Trillsate  Aspirin with Codeine Ecotrin Regular or Extra Strength Naprosyn Uracel  Atromid-S Efficin Naproxen Ursinus  Auranofin Capsules Elmiron Neocylate Vanquish  Axotal Emagrin Norgesic Verin  Azathioprine Empirin or Empirin with Codeine Normiflo Vitamin E  Azolid Emprazil Nuprin Voltaren  Bayer Aspirin plain, buffered or children's or timed BC Tablets or powders Encaprin Orgaran Warfarin Sodium  Buff-a-Comp Enoxaparin Orudis Zorpin  Buff-a-Comp with Codeine Equegesic Os-Cal-Gesic   Buffaprin Excedrin plain, buffered or Extra Strength Oxalid   Bufferin Arthritis Strength Feldene Oxphenbutazone   Bufferin plain or Extra Strength Feldene Capsules Oxycodone with Aspirin   Bufferin with Codeine Fenoprofen Fenoprofen Pabalate or Pabalate-SF   Buffets II Flogesic Panagesic   Buffinol plain or Extra Strength  Florinal or Florinal with Codeine Panwarfarin   Buf-Tabs Flurbiprofen Penicillamine   Butalbital Compound Four-way cold tablets Penicillin   Butazolidin Fragmin Pepto-Bismol   Carbenicillin Geminisyn Percodan   Carna Arthritis Reliever Geopen Persantine   Carprofen Gold's salt Persistin   Chloramphenicol Goody's Phenylbutazone   Chloromycetin Haltrain Piroxlcam   Clmetidine heparin Plaquenil   Cllnoril Hyco-pap Ponstel   Clofibrate Hydroxy chloroquine Propoxyphen         Before stopping any of these medications, be sure to consult the physician who ordered them.  Some, such as Coumadin (Warfarin) are ordered to prevent or treat serious conditions such as "deep thrombosis", "pumonary embolisms", and other heart problems.  The amount of time that  you may need off of the medication may also vary with the medication and the reason for which you were taking it.  If you are taking any of these medications, please make sure you notify your pain physician before you undergo any procedures.

## 2017-04-21 ENCOUNTER — Telehealth: Payer: Self-pay | Admitting: *Deleted

## 2017-05-20 ENCOUNTER — Ambulatory Visit: Payer: 59 | Admitting: Nurse Practitioner

## 2017-05-20 ENCOUNTER — Telehealth: Payer: Self-pay | Admitting: Pain Medicine

## 2017-05-20 NOTE — Telephone Encounter (Signed)
Voicemail left with patient that after reviewing his chart and having Dionisio David, NP review it is found that he is on a drug taper that will take him to a drug holiday.  His medications are due to run out on 05/25/17 and the June 14 medication management is appropriate for reevaluation of his therapy.  If he has additional questions asked him to please call our office and that we do not need to see him sooner than his scheduled appt.

## 2017-05-20 NOTE — Telephone Encounter (Signed)
Patient was asking what he could use for pain until he comes back from med holiday. Said he might get his phys to write for 800mg  Ibuprofen.

## 2017-05-20 NOTE — Telephone Encounter (Signed)
Error

## 2017-05-20 NOTE — Telephone Encounter (Signed)
Running out of meds and med refill appt not until June 14. Please call to discuss and let front know if we need to move his appt.

## 2017-05-25 ENCOUNTER — Other Ambulatory Visit: Payer: Self-pay | Admitting: Pain Medicine

## 2017-05-25 DIAGNOSIS — M792 Neuralgia and neuritis, unspecified: Secondary | ICD-10-CM

## 2017-06-01 ENCOUNTER — Encounter: Payer: Self-pay | Admitting: Pain Medicine

## 2017-06-01 ENCOUNTER — Ambulatory Visit: Payer: 59 | Attending: Pain Medicine | Admitting: Pain Medicine

## 2017-06-01 VITALS — BP 138/82 | HR 85 | Temp 97.0°F | Ht 74.0 in | Wt 229.0 lb

## 2017-06-01 DIAGNOSIS — M4722 Other spondylosis with radiculopathy, cervical region: Secondary | ICD-10-CM | POA: Insufficient documentation

## 2017-06-01 DIAGNOSIS — M5137 Other intervertebral disc degeneration, lumbosacral region: Secondary | ICD-10-CM | POA: Insufficient documentation

## 2017-06-01 DIAGNOSIS — Z8249 Family history of ischemic heart disease and other diseases of the circulatory system: Secondary | ICD-10-CM | POA: Insufficient documentation

## 2017-06-01 DIAGNOSIS — Z9109 Other allergy status, other than to drugs and biological substances: Secondary | ICD-10-CM | POA: Insufficient documentation

## 2017-06-01 DIAGNOSIS — M542 Cervicalgia: Secondary | ICD-10-CM | POA: Diagnosis not present

## 2017-06-01 DIAGNOSIS — Z809 Family history of malignant neoplasm, unspecified: Secondary | ICD-10-CM | POA: Insufficient documentation

## 2017-06-01 DIAGNOSIS — Z888 Allergy status to other drugs, medicaments and biological substances status: Secondary | ICD-10-CM | POA: Insufficient documentation

## 2017-06-01 DIAGNOSIS — M5412 Radiculopathy, cervical region: Secondary | ICD-10-CM | POA: Diagnosis not present

## 2017-06-01 DIAGNOSIS — G5603 Carpal tunnel syndrome, bilateral upper limbs: Secondary | ICD-10-CM | POA: Diagnosis not present

## 2017-06-01 DIAGNOSIS — M150 Primary generalized (osteo)arthritis: Secondary | ICD-10-CM | POA: Diagnosis not present

## 2017-06-01 DIAGNOSIS — M25512 Pain in left shoulder: Secondary | ICD-10-CM | POA: Diagnosis not present

## 2017-06-01 DIAGNOSIS — Z89422 Acquired absence of other left toe(s): Secondary | ICD-10-CM | POA: Insufficient documentation

## 2017-06-01 DIAGNOSIS — I10 Essential (primary) hypertension: Secondary | ICD-10-CM | POA: Insufficient documentation

## 2017-06-01 DIAGNOSIS — G9520 Unspecified cord compression: Secondary | ICD-10-CM | POA: Insufficient documentation

## 2017-06-01 DIAGNOSIS — M5416 Radiculopathy, lumbar region: Secondary | ICD-10-CM | POA: Insufficient documentation

## 2017-06-01 DIAGNOSIS — E11621 Type 2 diabetes mellitus with foot ulcer: Secondary | ICD-10-CM | POA: Diagnosis not present

## 2017-06-01 DIAGNOSIS — E785 Hyperlipidemia, unspecified: Secondary | ICD-10-CM | POA: Insufficient documentation

## 2017-06-01 DIAGNOSIS — M961 Postlaminectomy syndrome, not elsewhere classified: Secondary | ICD-10-CM | POA: Diagnosis not present

## 2017-06-01 DIAGNOSIS — M19011 Primary osteoarthritis, right shoulder: Secondary | ICD-10-CM | POA: Insufficient documentation

## 2017-06-01 DIAGNOSIS — E1142 Type 2 diabetes mellitus with diabetic polyneuropathy: Secondary | ICD-10-CM | POA: Diagnosis not present

## 2017-06-01 DIAGNOSIS — G8929 Other chronic pain: Secondary | ICD-10-CM

## 2017-06-01 DIAGNOSIS — Z833 Family history of diabetes mellitus: Secondary | ICD-10-CM | POA: Insufficient documentation

## 2017-06-01 DIAGNOSIS — M159 Polyosteoarthritis, unspecified: Secondary | ICD-10-CM

## 2017-06-01 DIAGNOSIS — Z7984 Long term (current) use of oral hypoglycemic drugs: Secondary | ICD-10-CM | POA: Insufficient documentation

## 2017-06-01 DIAGNOSIS — M792 Neuralgia and neuritis, unspecified: Secondary | ICD-10-CM | POA: Insufficient documentation

## 2017-06-01 DIAGNOSIS — M79604 Pain in right leg: Secondary | ICD-10-CM

## 2017-06-01 DIAGNOSIS — E1169 Type 2 diabetes mellitus with other specified complication: Secondary | ICD-10-CM | POA: Diagnosis not present

## 2017-06-01 DIAGNOSIS — M25511 Pain in right shoulder: Secondary | ICD-10-CM | POA: Diagnosis not present

## 2017-06-01 DIAGNOSIS — M545 Low back pain: Secondary | ICD-10-CM

## 2017-06-01 DIAGNOSIS — Z79891 Long term (current) use of opiate analgesic: Secondary | ICD-10-CM | POA: Diagnosis not present

## 2017-06-01 DIAGNOSIS — Z6829 Body mass index (BMI) 29.0-29.9, adult: Secondary | ICD-10-CM | POA: Insufficient documentation

## 2017-06-01 DIAGNOSIS — Z881 Allergy status to other antibiotic agents status: Secondary | ICD-10-CM | POA: Insufficient documentation

## 2017-06-01 DIAGNOSIS — E669 Obesity, unspecified: Secondary | ICD-10-CM | POA: Insufficient documentation

## 2017-06-01 DIAGNOSIS — G894 Chronic pain syndrome: Secondary | ICD-10-CM | POA: Diagnosis not present

## 2017-06-01 DIAGNOSIS — Z9889 Other specified postprocedural states: Secondary | ICD-10-CM | POA: Insufficient documentation

## 2017-06-01 DIAGNOSIS — M19012 Primary osteoarthritis, left shoulder: Secondary | ICD-10-CM | POA: Insufficient documentation

## 2017-06-01 DIAGNOSIS — M79605 Pain in left leg: Secondary | ICD-10-CM | POA: Diagnosis not present

## 2017-06-01 DIAGNOSIS — Z882 Allergy status to sulfonamides status: Secondary | ICD-10-CM | POA: Insufficient documentation

## 2017-06-01 DIAGNOSIS — F17210 Nicotine dependence, cigarettes, uncomplicated: Secondary | ICD-10-CM | POA: Diagnosis not present

## 2017-06-01 DIAGNOSIS — M51379 Other intervertebral disc degeneration, lumbosacral region without mention of lumbar back pain or lower extremity pain: Secondary | ICD-10-CM | POA: Insufficient documentation

## 2017-06-01 DIAGNOSIS — Z880 Allergy status to penicillin: Secondary | ICD-10-CM | POA: Insufficient documentation

## 2017-06-01 DIAGNOSIS — M7138 Other bursal cyst, other site: Secondary | ICD-10-CM | POA: Insufficient documentation

## 2017-06-01 MED ORDER — METHOCARBAMOL 750 MG PO TABS: 750.0000 mg | ORAL_TABLET | Freq: Three times a day (TID) | ORAL | 5 refills | Status: DC | PRN

## 2017-06-01 MED ORDER — MELOXICAM 15 MG PO TABS: 15.0000 mg | ORAL_TABLET | Freq: Every day | ORAL | 5 refills | Status: DC

## 2017-06-01 MED ORDER — KETOROLAC TROMETHAMINE 60 MG/2ML IM SOLN
60.0000 mg | Freq: Once | INTRAMUSCULAR | Status: AC
  Administered 2017-06-01: 60 mg via INTRAMUSCULAR
  Filled 2017-06-01: qty 2

## 2017-06-01 MED ORDER — ORPHENADRINE CITRATE 30 MG/ML IJ SOLN
60.0000 mg | Freq: Once | INTRAMUSCULAR | Status: AC
  Administered 2017-06-01: 60 mg via INTRAMUSCULAR
  Filled 2017-06-01: qty 2

## 2017-06-01 MED ORDER — GABAPENTIN 600 MG PO TABS: 1200.0000 mg | ORAL_TABLET | Freq: Four times a day (QID) | ORAL | 5 refills | Status: DC

## 2017-06-01 NOTE — Progress Notes (Signed)
Patient's Name: Willie Krueger  MRN: [id number]  Referring Provider: [facility] [place] [facility]  DOB: 1956/05/04  PCP: [facility] [place] [facility]  DOS: 06/01/2017  Note by: Kathlen Brunswick. Dossie Arbour, MD  Service setting: Ambulatory outpatient  Specialty: Interventional Pain Management  Location: [facility] (AMB) Pain Management Facility    Patient type: Established   Primary Reason(s) for Visit: Encounter for post-procedure evaluation of chronic illness with mild to moderate exacerbation CC: Arm Pain (bilateral); Hand Pain (bilateral); and Leg Pain (left)  HPI  Willie Krueger is a 61 y.o. year old, male patient, who comes today for a post-procedure evaluation. He has Toe ulcer ([place]); Cervical spinal cord compression ([place]); Cardiac murmur; Chronic hepatitis C virus infection ([place]); HLD (hyperlipidemia); Benign hypertension; ED (erectile dysfunction) of organic origin; MI (mitral incompetence); Adiposity; Current smoker; History of decompression of median nerve; Long term current use of opiate analgesic; Long term prescription opiate use; Opiate use (67.5 MME/Day); Opiate dependence ([place]); Encounter for therapeutic drug level monitoring; Chronic neck pain (Right side); Cervical post-laminectomy syndrome; Chronic low back pain (Location of Primary Source of Pain) (Midline pain) (R>L); Chronic cervical radicular pain (Bilateral) (R>L); Chronic lumbar radicular pain (Bilateral) (R>L); Substance use disorder Risk: LOW; Failed back surgical syndrome; Chronic lower extremity pain (Location of Secondary source of pain) (Bilateral) (R>L); History of carpal tunnel release of both wrists; Grade I anterolisthesis of C7 on T1; Encounter for chronic pain management; Elevated C-reactive protein (CRP); Diabetic osteomyelitis ([place]); Neuropathy; Obesity; Neurogenic pain; Type 2 diabetes mellitus with foot ulcer (CODE) ([place]); Diabetic peripheral neuropathy ([place]); Chronic shoulder pain (Location of Tertiary source of pain) (Bilateral) (R>L); Lumbar facet  syndrome (Location of Primary Source of Pain) (Bilateral) (R>L); Abnormal MRI, lumbar spine (05/19/2016); Lumbar spondylosis; Diabetic infection of left foot ([place]); Allergy to drug; Diabetic foot ulcer with osteomyelitis ([place]); Chronic pain syndrome; Allergic drug rash due to anti-infective agent; Enlarged thyroid; Osteoarthritis; Degeneration of lumbosacral intervertebral disc; History of spinal surgery; Synovial cyst of lumbar spine; Acute neck pain; Osteoarthritis of shoulders (B) (R>L); and Cervical spondylosis on his problem list. His primarily concern today is the Arm Pain (bilateral); Hand Pain (bilateral); and Leg Pain (left)  Pain Assessment: Self-Reported Pain Score: 7 /10 Clinically the patient looks like a 3/10 Reported level is inconsistent with clinical observations. Score may indicate symptom exaggeration Pain Type: Chronic pain Pain Location: Arm (hands, ) Pain Orientation: Right, Left Pain Descriptors / Indicators: Aching Pain Frequency: Constant  Willie Krueger comes in today for post-procedure evaluation after the treatment done on 04/20/2017. The patient returns to the clinics today after having had a left-sided lumbar facet radiofrequency ablation under fluoroscopic guidance and IV sedation on 04/20/2017. The patient initially indicated that he had not attained any significant benefit, but as it turns out he was referring to the pain in the lower extremities rather than the lower back where the treatment to place. Evaluation of the lower back revealed 100% relief of the pain in that area indicating an excellent response to the radiofrequency ablation. He continues to have some discomfort in the lower extremity but he describes this as a burning sensation rather than the pain that he was experiencing before. In addition to this, the patient's main complaint today is that of pain in both upper extremities with the right side being worst on the left. He indicates having pain in both arms  primarily in the area of the shoulder and going down the upper extremity to the level of the elbow and the lateral aspect  with pain also in both hands. However, it turns out that he had a bilateral carpal tunnel surgery that could be responsible for this pain in the hands and upon asking him to demonstrate range of motion of his shoulders, it is very clear that this is decreased. With pain in both shoulders, decreased range of motion, and a pain pattern compatible with arthropathy, decided to order diagnostic bilateral shoulder joint x-rays, since he has none for this area. In addition, I will be scheduling him for a diagnostic bilateral intra-articular shoulder joint injection to see if we can address some of this pain.  The patient completed the tapering of the opioids that he describes having been off of the oxycodone for several weeks. He also indicates that he thinks may not need to continue taking it as long as I can provide him with something better than they got been taking and the Mobic. As it turns out, he has been taking Mobic as well as other over-the-counter nonsteroidal anti-inflammatory drugs, despite the fact that I had warned him that he couldn't do that. This is an issue that I continued to have with this patient that I provide him with information and he apparently is still not listening or reading anything that I gave him. An example of this would be the "pain scale". Today I notified the patient that I would not be going back to using oxycodone since I cannot afford having him take the medication anyway he wants to and I also cannot afford trusting him any longer with anything that compose some significant risks. Today I confronted him about the issue that took place on 04/20/2017 when he had his driver taking to his car, and then him driving away, by himself, after having had a procedure where he had received IV sedation. He did not deny that this had happened and in fact he confirmed it. He  claims that he did it because he felt that he was not sedated and that he couldn't do anything, including driving his vehicle. In the past we had already had some problems with him not following my medical orders and recommendations, in particularly about the use of opioids, and at this time I had given him a final warning that should anything similar happened, I would be taking steps to prevent it from occurring again. Today, I told him that I was very sorry that he had decided to test my resolve with regards to this issue and unfortunately, I would be taking steps, as promised, to avoid this from happening again. Today I have informed the patient that I will not be going back on opioids since I cannot trust him with their appropriate use. In addition, I also informed them that it is very unlikely that I will again be doing any procedures using sedation, unless we had some type of guarantee, from the driver, that this would not be happening again.  Because the patient's worse pain today is that of the shoulders, I have ordered some x-rays and I will see him back for a diagnostic intra-articular shoulder joint injection under fluoroscopic guidance, no sedation.  Further details on both, my assessment(s), as well as the proposed treatment plan, please see below.  Post-Procedure Assessment  04/20/2017 Procedure: Left lumbar facet radiofrequency ablation under fluoroscopic guidance and IV sedation Pre-procedure pain score:  7/10 Post-procedure pain score: 0/10         Influential Factors: BMI: 29.40 kg/m Intra-procedural challenges: None observed Assessment challenges: None detected           Post-procedural side-effects, adverse reactions, or complications: Unfortunately, despite the fact that in numerous locations he had been informed that he needed to have a driver to take him home after procedures requiring sedation, we witnessed him get into his car and drive away on his own. He fooled by having a  driver that essentially drove him from the buildings main entrance to his car were he got on and drove away. Because this is a willful violation of the instructions that we had given him, we have come to the conclusion that we will no longer be offering him any sedation for any of his procedures. Reported issues: None  Sedation: Sedation provided. When no sedatives are used, the analgesic levels obtained are directly associated to the effectiveness of the local anesthetics. However, when sedation is provided, the level of analgesia obtained during the initial 1 hour following the intervention, is believed to be the result of a combination of factors. These factors may include, but are not limited to: 1. The effectiveness of the local anesthetics used. 2. The effects of the analgesic(s) and/or anxiolytic(s) used. 3. The degree of discomfort experienced by the patient at the time of the procedure. 4. The patients ability and reliability in recalling and recording the events. 5. The presence and influence of possible secondary gains and/or psychosocial factors. Reported result: Relief experienced during the 1st hour after the procedure: 100 % (Ultra-Short Term Relief) Interpretative annotation: Analgesia during this period is likely to be Local Anesthetic and/or IV Sedative (Analgesic/Anxiolitic) related.          Effects of local anesthetic: The analgesic effects attained during this period are directly associated to the localized infiltration of local anesthetics and therefore cary significant diagnostic value as to the etiological location, or anatomical origin, of the pain. Expected duration of relief is directly dependent on the pharmacodynamics of the local anesthetic used. Long-acting (4-6 hours) anesthetics used.  Reported result: Relief during the next 4 to 6 hour after the procedure: 100 % (Short-Term Relief) Interpretative annotation: Complete relief would suggest area to be the source of the  pain.          Long-term benefit: Defined as the period of time past the expected duration of local anesthetics. With the possible exception of prolonged sympathetic blockade from the local anesthetics, benefits during this period are typically attributed to, or associated with, other factors such as analgesic sensory neuropraxia, antiinflammatory effects, or beneficial biochemical changes provided by agents other than the local anesthetics Reported result: Extended relief following procedure: 80 % (lasting 1 week) (Long-Term Relief) Interpretative annotation: Good relief. Possible therapeutic success. Benefit could signal adequate RF ablation  Current benefits: Defined as persistent relief that continues at this point in time.   Reported results: Treated area: 100 %      His back pain is now gone and has not come back. He is no longer taking opioids and he states that he feels he doesn't need them anymore. Interpretative annotation: Ongoing benefits would suggest effective therapeutic approach  Interpretation: Results would suggest a successful therapeutic intervention.          Laboratory Chemistry  Inflammation Markers Lab Results  Component Value Date   CRP 28.9 (H) 10/13/2016   ESRSEDRATE 10 11/21/2016   (CRP: Acute Phase) (ESR: Chronic Phase) Renal Function Markers Lab Results  Component Value Date   BUN 21 (H) 11/22/2016   CREATININE 1.13 11/22/2016   GFRAA >60 11/22/2016   GFRNONAA >60 11/22/2016   Hepatic Function   Markers Lab Results  Component Value Date   AST 22 11/21/2016   ALT 11 (L) 11/21/2016   ALBUMIN 3.9 11/21/2016   ALKPHOS 87 11/21/2016   Electrolytes Lab Results  Component Value Date   NA 133 (L) 11/22/2016   K 4.4 11/22/2016   CL 103 11/22/2016   CALCIUM 8.8 (L) 11/22/2016   MG 1.9 02/27/2016   Neuropathy Markers No results found for: VITAMINB12 Bone Pathology Markers Lab Results  Component Value Date   ALKPHOS 87 11/21/2016   CALCIUM 8.8 (L)  11/22/2016   Coagulation Parameters Lab Results  Component Value Date   PLT 193 11/22/2016   Cardiovascular Markers Lab Results  Component Value Date   BNP 86.0 11/21/2016   HGB 14.3 11/22/2016   HCT 41.7 11/22/2016   Note: Lab results reviewed.  Recent Diagnostic Imaging Review  Dg C-arm 1-60 Min-no Report  Result Date: 04/20/2017 Fluoroscopy was utilized by the requesting physician.  No radiographic interpretation.   Note: Imaging results reviewed.          Meds  The patient has a current medication list which includes the following prescription(s): acetaminophen, aspirin ec, atorvastatin, vitamin d3, cvs bisacodyl, diphenhydramine, docusate sodium, furosemide, gabapentin, glimepiride, glipizide, lisinopril, meloxicam, metformin, methocarbamol, and sildenafil.  Current Outpatient Prescriptions on File Prior to Visit  Medication Sig  . acetaminophen (TYLENOL) 500 MG tablet Take 500 mg by mouth every 6 (six) hours as needed.  . atorvastatin (LIPITOR) 40 MG tablet Take 40 mg by mouth daily.  . Cholecalciferol (VITAMIN D3) 2000 units capsule Take 2,000 Units by mouth daily.   . [facility]S BISACODYL 5 MG EC tablet 5 mg daily as needed.   . diphenhydrAMINE (BENADRYL) 25 MG tablet Take 25 mg by mouth every 6 (six) hours as needed.  . docusate sodium (COLACE) 100 MG capsule Take 1 capsule (100 mg total) by mouth 2 (two) times daily.  . glimepiride (AMARYL) 2 MG tablet Take 2 mg by mouth daily with breakfast.   . glipiZIDE (GLUCOTROL XL) 10 MG 24 hr tablet Take by mouth.  . lisinopril (PRINIVIL,ZESTRIL) 40 MG tablet Take 40 mg by mouth daily.  . metFORMIN (GLUCOPHAGE-XR) 500 MG 24 hr tablet Take 500 mg by mouth 2 (two) times daily.  . sildenafil (VIAGRA) 100 MG tablet Take 100 mg by mouth daily as needed for erectile dysfunction.   No current facility-administered medications on file prior to visit.    ROS  Constitutional: Denies any fever or chills Gastrointestinal: No reported  hemesis, hematochezia, vomiting, or acute GI distress Musculoskeletal: Denies any acute onset joint swelling, redness, loss of ROM, or weakness Neurological: No reported episodes of acute onset apraxia, aphasia, dysarthria, agnosia, amnesia, paralysis, loss of coordination, or loss of consciousness  Allergies  Mr. Cheema is allergic to piperacillin-tazobactam in dex; cleocin [clindamycin]; clindamycin phosphate; other; sulfa antibiotics; and zosyn [piperacillin sod-tazobactam so].  PFSH  Drug: Mr. Denard  reports that he does not use drugs. Alcohol:  reports that he does not drink alcohol. Tobacco:  reports that he has been smoking Cigarettes.  He has been smoking about 1.50 packs per day. He has never used smokeless tobacco. Medical:  has a past medical history of Acute postoperative pain (02/09/2017); Chronic pain; Chronic pain associated with significant psychosocial dysfunction (05/04/2015); Diabetes mellitus without complication (HCC); Heart murmur; History of spinal stenosis (10/31/2015); Hyperlipidemia; Hypertension; Leg weakness (07/07/2014); Mitral valve regurgitation; Nerve root inflammation (06/26/2014); and Osteoarthritis. Family: family history includes Cancer in his mother; Diabetes in his   mother; Heart disease in his father.  Past Surgical History:  Procedure Laterality Date  . AMPUTATION TOE Left 10/2016   partial  . CARPAL TUNNEL RELEASE    . FOOT SURGERY Left 09/2016  . IRRIGATION AND DEBRIDEMENT FOOT Left 10/13/2016   Procedure: IRRIGATION AND DEBRIDEMENT FOOT;  Surgeon: Samara Deist, DPM;  Location: [facility] ORS;  Service: Podiatry;  Laterality: Left;  . PICC LINE PLACE PERIPHERAL ([place] HX) Left [id number]  . SPINE SURGERY     Constitutional Exam  General appearance: Well nourished, well developed, and well hydrated. In no apparent acute distress Vitals:   06/01/17 1343  BP: 138/82  Pulse: 85  Temp: 97 F (36.1 C)  SpO2: 99%  Weight: 229 lb (103.9 kg)  Height: 6' 2" (1.88 m)    BMI Assessment: Estimated body mass index is 29.4 kg/m as calculated from the following:   Height as of this encounter: 6' 2" (1.88 m).   Weight as of this encounter: 229 lb (103.9 kg).  BMI interpretation table: BMI level Category Range association with higher incidence of chronic pain  <18 kg/m2 Underweight   18.5-24.9 kg/m2 Ideal body weight   25-29.9 kg/m2 Overweight Increased incidence by 20%  30-34.9 kg/m2 Obese (Class I) Increased incidence by 68%  35-39.9 kg/m2 Severe obesity (Class II) Increased incidence by 136%  >40 kg/m2 Extreme obesity (Class III) Increased incidence by 254%   BMI Readings from Last 4 Encounters:  06/01/17 29.40 kg/m  04/20/17 30.43 kg/m  03/16/17 29.40 kg/m  02/09/17 29.40 kg/m   Wt Readings from Last 4 Encounters:  06/01/17 229 lb (103.9 kg)  04/20/17 237 lb (107.5 kg)  03/16/17 229 lb (103.9 kg)  02/09/17 229 lb (103.9 kg)  Psych/Mental status: Alert, oriented x 3 (person, place, & time)       Eyes: PERLA Respiratory: No evidence of acute respiratory distress  Cervical Spine Exam  Inspection: Well healed scar from previous spine surgery detected Alignment: Symmetrical Functional ROM: Decreased ROM      Stability: No instability detected Muscle strength & Tone: Functionally intact Sensory: Movement-associated pain Palpation: Tender              Upper Extremity (UE) Exam    Side: Right upper extremity  Side: Left upper extremity  Inspection: No masses, redness, swelling, or asymmetry. No contractures  Inspection: No masses, redness, swelling, or asymmetry. No contractures  Functional ROM: Diminished ROM for shoulder  Functional ROM: Diminished ROM for shoulder  Muscle strength & Tone: Functionally intact  Muscle strength & Tone: Functionally intact  Sensory: Arthropathic arthralgia  Sensory: Arthropathic arthralgia  Palpation: Complains of area being tender to palpation              Palpation: Complains of area being tender to  palpation              Specialized Test(s): Deferred         Specialized Test(s): Deferred          Thoracic Spine Exam  Inspection: No masses, redness, or swelling Alignment: Symmetrical Functional ROM: Unrestricted ROM Stability: No instability detected Sensory: Unimpaired Muscle strength & Tone: No palpable anomalies  Lumbar Spine Exam  Inspection: No masses, redness, or swelling Alignment: Symmetrical Functional ROM: Unrestricted ROM      Stability: No instability detected Muscle strength & Tone: Functionally intact Sensory: Unimpaired Palpation: No palpable anomalies       Provocative Tests: Lumbar Hyperextension and rotation test: evaluation deferred today  Patrick's Maneuver: evaluation deferred today                    Gait & Posture Assessment  Ambulation: Unassisted Gait: Relatively normal for age and body habitus Posture: WNL   Lower Extremity Exam    Side: Right lower extremity  Side: Left lower extremity  Inspection: No masses, redness, swelling, or asymmetry. No contractures  Inspection: No masses, redness, swelling, or asymmetry. No contractures  Functional ROM: Unrestricted ROM          Functional ROM: Unrestricted ROM          Muscle strength & Tone: Functionally intact  Muscle strength & Tone: Functionally intact  Sensory: Unimpaired  Sensory: Unimpaired  Palpation: No palpable anomalies  Palpation: No palpable anomalies   Assessment  Primary Diagnosis & Pertinent Problem List: The primary encounter diagnosis was Acute neck pain. Diagnoses of Chronic low back pain (Location of Primary Source of Pain) (Midline pain) (R>L), Chronic shoulder pain (Location of Tertiary source of pain) (Bilateral) (R>L), Cervical post-laminectomy syndrome, Cervical spinal cord compression (HCC), Osteoarthritis of spine with radiculopathy, cervical region, Chronic cervical radicular pain (Bilateral) (R>L), Osteoarthritis of shoulders (B) (R>L), Chronic lower extremity pain  (Location of Secondary source of pain) (Bilateral) (R>L), Chronic lumbar radicular pain (Bilateral) (R>L), Chronic pain syndrome, Failed back surgical syndrome, Neurogenic pain, and Osteoarthritis were also pertinent to this visit.  Status Diagnosis  Having a Flare-up Resolved Persistent 1. Acute neck pain   2. Chronic low back pain (Location of Primary Source of Pain) (Midline pain) (R>L)   3. Chronic shoulder pain (Location of Tertiary source of pain) (Bilateral) (R>L)   4. Cervical post-laminectomy syndrome   5. Cervical spinal cord compression (HCC)   6. Osteoarthritis of spine with radiculopathy, cervical region   7. Chronic cervical radicular pain (Bilateral) (R>L)   8. Osteoarthritis of shoulders (B) (R>L)   9. Chronic lower extremity pain (Location of Secondary source of pain) (Bilateral) (R>L)   10. Chronic lumbar radicular pain (Bilateral) (R>L)   11. Chronic pain syndrome   12. Failed back surgical syndrome   13. Neurogenic pain   14. Osteoarthritis     Problems updated and reviewed during this visit: Problem  Acute Neck Pain  Osteoarthritis of shoulders (B) (R>L)  Cervical spondylosis  Degeneration of Lumbosacral Intervertebral Disc  History of Spinal Surgery  Synovial Cyst of Lumbar Spine  Acute Postoperative Pain (Resolved)   Plan of Care  Pharmacotherapy (Medications Ordered): Meds ordered this encounter  Medications  . gabapentin (NEURONTIN) 600 MG tablet    Sig: Take 2 tablets (1,200 mg total) by mouth every 6 (six) hours.    Dispense:  240 tablet    Refill:  5    Do not place this medication, or any other prescription from our practice, on "Automatic Refill". Patient may have prescription filled one day early if pharmacy is closed on scheduled refill date.  . meloxicam (MOBIC) 15 MG tablet    Sig: Take 1 tablet (15 mg total) by mouth daily.    Dispense:  30 tablet    Refill:  5  . methocarbamol (ROBAXIN) 750 MG tablet    Sig: Take 1 tablet (750 mg total)  by mouth every 8 (eight) hours as needed for muscle spasms.    Dispense:  90 tablet    Refill:  5    Do not place this medication, or any other prescription from our practice, on "Automatic Refill". Patient may have prescription filled   one day early if pharmacy is closed on scheduled refill date.  . orphenadrine (NORFLEX) injection 60 mg  . ketorolac (TORADOL) injection 60 mg   New Prescriptions   METHOCARBAMOL (ROBAXIN) 750 MG TABLET    Take 1 tablet (750 mg total) by mouth every 8 (eight) hours as needed for muscle spasms.   Medications administered today: We administered orphenadrine and ketorolac. Lab-work, procedure(s), and/or referral(s): Orders Placed This Encounter  Procedures  . SHOULDER INJECTION  . DG Shoulder Left  . DG Shoulder Right   Imaging and/or referral(s): DG SHOULDER LEFT DG SHOULDER RIGHT  Interventional therapies: Planned, scheduled, and/or pending:   Diagnostic Bilateral shoulder joint injection under fluoro, NO sedation.   Considering:   Diagnostic Bilateral shoulder joint injection under fluoro, NO sedation.   Palliative PRN treatment(s):   None at this time.    Provider-requested follow-up: Return for NS procedure, (ASAA), by MD.  No future appointments. Primary Care Physician: [facility] [place] [facility] Location: [facility] Note by: Malique Driskill A. Dossie Arbour, M.D, DABA, DABAPM, DABPM, DABIPP, FIPP Date: 06/01/2017; Time: 4:57 PM  Patient instructions provided during this appointment: Patient Instructions   ____________________________________________________________________________________________    Go get xrays done in the medical mall today.  Pre procedure instructions given with teach back 3 done.     Preparing for your procedure (without sedation) Instructions: . Oral Intake: Do not eat or drink anything for at least 3 hours prior to your procedure. . Transportation: Unless otherwise stated by your physician,  you may drive yourself after the procedure. . Blood Pressure Medicine: Take your blood pressure medicine with a sip of water the morning of the procedure. . Blood thinners:  . Diabetics on insulin: Notify the staff so that you can be scheduled 1st case in the morning. If your diabetes requires high dose insulin, take only  of your normal insulin dose the morning of the procedure and notify the staff that you have done so. . Preventing infections: Shower with an antibacterial soap the morning of your procedure.  . Build-up your immune system: Take 1000 mg of Vitamin C with every meal (3 times a day) the day prior to your procedure. Marland Kitchen Antibiotics: Inform the staff if you have a condition or reason that requires you to take antibiotics before dental procedures. . Pregnancy: If you are pregnant, call and cancel the procedure. . Sickness: If you have a cold, fever, or any active infections, call and cancel the procedure. . Arrival: You must be in the facility at least 30 minutes prior to your scheduled procedure. . Children: Do not bring any children with you. . Dress appropriately: Bring dark clothing that you would not mind if they get stained. . Valuables: Do not bring any jewelry or valuables. Procedure appointments are reserved for interventional treatments only. Marland Kitchen No Prescription Refills. . No medication changes will be discussed during procedure appointments. . No disability issues will be discussed. ____________________________________________________________________________________________  ____________________________________________________________________________________________  Pain Scale  Introduction: The pain score used by this practice is the Verbal Numerical Rating Scale (VNRS-11). This is an 11-point scale. It is for adults and children 10 years or older. There are significant differences in how the pain score is reported, used, and applied. Forget everything you learned in the  past and learn this scoring system.  General Information: The scale should reflect your current level of pain. Unless you are specifically asked for the level of your worst pain, or your average pain. If you are asked for  one of these two, then it should be understood that it is over the past 24 hours.  Basic Activities of Daily Living (ADL): Personal hygiene, dressing, eating, transferring, and using restroom.  Instructions: Most patients tend to report their level of pain as a combination of two factors, their physical pain and their psychosocial pain. This last one is also known as "suffering" and it is reflection of how physical pain affects you socially and psychologically. From now on, report them separately. From this point on, when asked to report your pain level, report only your physical pain. Use the following table for reference.  Pain Clinic Pain Levels (0-5/10)  Pain Level Score  Description  No Pain 0   Mild pain 1 Nagging, annoying, but does not interfere with basic activities of daily living (ADL). Patients are able to eat, bathe, get dressed, toileting (being able to get on and off the toilet and perform personal hygiene functions), transfer (move in and out of bed or a chair without assistance), and maintain continence (able to control bladder and bowel functions). Blood pressure and heart rate are unaffected. A normal heart rate for a healthy adult ranges from 60 to 100 bpm (beats per minute).   Mild to moderate pain 2 Noticeable and distracting. Impossible to hide from other people. More frequent flare-ups. Still possible to adapt and function close to normal. It can be very annoying and may have occasional stronger flare-ups. With discipline, patients may get used to it and adapt.   Moderate pain 3 Interferes significantly with activities of daily living (ADL). It becomes difficult to feed, bathe, get dressed, get on and off the toilet or to perform personal hygiene functions.  Difficult to get in and out of bed or a chair without assistance. Very distracting. With effort, it can be ignored when deeply involved in activities.   Moderately severe pain 4 Impossible to ignore for more than a few minutes. With effort, patients may still be able to manage work or participate in some social activities. Very difficult to concentrate. Signs of autonomic nervous system discharge are evident: dilated pupils (mydriasis); mild sweating (diaphoresis); sleep interference. Heart rate becomes elevated (>115 bpm). Diastolic blood pressure (lower number) rises above 100 mmHg. Patients find relief in laying down and not moving.   Severe pain 5 Intense and extremely unpleasant. Associated with frowning face and frequent crying. Pain overwhelms the senses.  Ability to do any activity or maintain social relationships becomes significantly limited. Conversation becomes difficult. Pacing back and forth is common, as getting into a comfortable position is nearly impossible. Pain wakes you up from deep sleep. Physical signs will be obvious: pupillary dilation; increased sweating; goosebumps; brisk reflexes; cold, clammy hands and feet; nausea, vomiting or dry heaves; loss of appetite; significant sleep disturbance with inability to fall asleep or to remain asleep. When persistent, significant weight loss is observed due to the complete loss of appetite and sleep deprivation.  Blood pressure and heart rate becomes significantly elevated. Caution: If elevated blood pressure triggers a pounding headache associated with blurred vision, then the patient should immediately seek attention at an urgent or emergency care unit, as these may be signs of an impending stroke.    Emergency Department Pain Levels (6-10/10)  Emergency Room Pain 6 Severely limiting. Requires emergency care and should not be seen or managed at an outpatient pain management facility. Communication becomes difficult and requires great  effort. Assistance to reach the emergency department may be required. Facial flushing and  profuse sweating along with potentially dangerous increases in heart rate and blood pressure will be evident.   Distressing pain 7 Self-care is very difficult. Assistance is required to transport, or use restroom. Assistance to reach the emergency department will be required. Tasks requiring coordination, such as bathing and getting dressed become very difficult.   Disabling pain 8 Self-care is no longer possible. At this level, pain is disabling. The individual is unable to do even the most "basic" activities such as walking, eating, bathing, dressing, transferring to a bed, or toileting. Fine motor skills are lost. It is difficult to think clearly.   Incapacitating pain 9 Pain becomes incapacitating. Thought processing is no longer possible. Difficult to remember your own name. Control of movement and coordination are lost.   The worst pain imaginable 10 At this level, most patients pass out from pain. When this level is reached, collapse of the autonomic nervous system occurs, leading to a sudden drop in blood pressure and heart rate. This in turn results in a temporary and dramatic drop in blood flow to the brain, leading to a loss of consciousness. Fainting is one of the body's self defense mechanisms. Passing out puts the brain in a calmed state and causes it to shut down for a while, in order to begin the healing process.    Summary: 1. Refer to this scale when providing us with your pain level. 2. Be accurate and careful when reporting your pain level. This will help with your care. 3. Over-reporting your pain level will lead to loss of credibility. 4. Even a level of 1/10 means that there is pain and will be treated at our facility. 5. High, inaccurate reporting will be documented as "Symptom Exaggeration", leading to loss of credibility and suspicions of possible secondary gains such as obtaining more  narcotics, or wanting to appear disabled, for fraudulent reasons. 6. Only pain levels of 5 or below will be seen at our facility. 7. Pain levels of 6 and above will be sent to the Emergency Department and the appointment cancelled. ____________________________________________________________________________________________    

## 2017-06-01 NOTE — Patient Instructions (Addendum)
____________________________________________________________________________________________    [facility] [place] [place] [facility] [place].  Pre procedure instructions given with teach back 3 done.     Preparing for your procedure (without sedation) Instructions: . Oral Intake: Do not eat or drink anything for at least 3 hours prior to your procedure. . Transportation: Unless otherwise stated by your physician, you may drive yourself after the procedure. . Blood Pressure Medicine: Take your blood pressure medicine with a sip of water the morning of the procedure. . Blood thinners:  . Diabetics on insulin: Notify the staff so that you can be scheduled 1st case in the morning. If your diabetes requires high dose insulin, take only  of your normal insulin dose the morning of the procedure and notify the staff that you have done so. . Preventing infections: Shower with an antibacterial soap the morning of your procedure.  . Build-up your immune system: Take 1000 mg of Vitamin C with every meal (3 times a day) the day prior to your procedure. Marland Kitchen Antibiotics: Inform the staff if you have a condition or reason that requires you to take antibiotics before dental procedures. . Pregnancy: If you are pregnant, call and cancel the procedure. . Sickness: If you have a cold, fever, or any active infections, call and cancel the procedure. . Arrival: You must be in the facility at least 30 minutes prior to your scheduled procedure. . Children: Do not bring any children with you. . Dress appropriately: Bring dark clothing that you would not mind if they get stained. . Valuables: Do not bring any jewelry or valuables. Procedure appointments are reserved for interventional treatments only. Marland Kitchen No Prescription Refills. . No medication changes will be discussed during procedure appointments. . No disability issues will be  discussed. ____________________________________________________________________________________________  ____________________________________________________________________________________________  Pain Scale  Introduction: The pain score used by this practice is the Verbal Numerical Rating Scale (VNRS-11). This is an 11-point scale. It is for adults and children 10 years or older. There are significant differences in how the pain score is reported, used, and applied. Forget everything you learned in the past and learn this scoring system.  General Information: The scale should reflect your current level of pain. Unless you are specifically asked for the level of your worst pain, or your average pain. If you are asked for one of these two, then it should be understood that it is over the past 24 hours.  Basic Activities of Daily Living (ADL): Personal hygiene, dressing, eating, transferring, and using restroom.  Instructions: Most patients tend to report their level of pain as a combination of two factors, their physical pain and their psychosocial pain. This last one is also known as "suffering" and it is reflection of how physical pain affects you socially and psychologically. From now on, report them separately. From this point on, when asked to report your pain level, report only your physical pain. Use the following table for reference.  Pain Clinic Pain Levels (0-5/10)  Pain Level Score  Description  No Pain 0   Mild pain 1 Nagging, annoying, but does not interfere with basic activities of daily living (ADL). Patients are able to eat, bathe, get dressed, toileting (being able to get on and off the toilet and perform personal hygiene functions), transfer (move in and out of bed or a chair without assistance), and maintain continence (able to control bladder and bowel functions). Blood pressure and heart rate are unaffected. A normal heart rate for a healthy adult ranges  from 60 to 100 bpm  (beats per minute).   Mild to moderate pain 2 Noticeable and distracting. Impossible to hide from other people. More frequent flare-ups. Still possible to adapt and function close to normal. It can be very annoying and may have occasional stronger flare-ups. With discipline, patients may get used to it and adapt.   Moderate pain 3 Interferes significantly with activities of daily living (ADL). It becomes difficult to feed, bathe, get dressed, get on and off the toilet or to perform personal hygiene functions. Difficult to get in and out of bed or a chair without assistance. Very distracting. With effort, it can be ignored when deeply involved in activities.   Moderately severe pain 4 Impossible to ignore for more than a few minutes. With effort, patients may still be able to manage work or participate in some social activities. Very difficult to concentrate. Signs of autonomic nervous system discharge are evident: dilated pupils (mydriasis); mild sweating (diaphoresis); sleep interference. Heart rate becomes elevated (>115 bpm). Diastolic blood pressure (lower number) rises above 100 mmHg. Patients find relief in laying down and not moving.   Severe pain 5 Intense and extremely unpleasant. Associated with frowning face and frequent crying. Pain overwhelms the senses.  Ability to do any activity or maintain social relationships becomes significantly limited. Conversation becomes difficult. Pacing back and forth is common, as getting into a comfortable position is nearly impossible. Pain wakes you up from deep sleep. Physical signs will be obvious: pupillary dilation; increased sweating; goosebumps; brisk reflexes; cold, clammy hands and feet; nausea, vomiting or dry heaves; loss of appetite; significant sleep disturbance with inability to fall asleep or to remain asleep. When persistent, significant weight loss is observed due to the complete loss of appetite and sleep deprivation.  Blood pressure and heart  rate becomes significantly elevated. Caution: If elevated blood pressure triggers a pounding headache associated with blurred vision, then the patient should immediately seek attention at an urgent or emergency care unit, as these may be signs of an impending stroke.    Emergency Department Pain Levels (6-10/10)  Emergency Room Pain 6 Severely limiting. Requires emergency care and should not be seen or managed at an outpatient pain management facility. Communication becomes difficult and requires great effort. Assistance to reach the emergency department may be required. Facial flushing and profuse sweating along with potentially dangerous increases in heart rate and blood pressure will be evident.   Distressing pain 7 Self-care is very difficult. Assistance is required to transport, or use restroom. Assistance to reach the emergency department will be required. Tasks requiring coordination, such as bathing and getting dressed become very difficult.   Disabling pain 8 Self-care is no longer possible. At this level, pain is disabling. The individual is unable to do even the most "basic" activities such as walking, eating, bathing, dressing, transferring to a bed, or toileting. Fine motor skills are lost. It is difficult to think clearly.   Incapacitating pain 9 Pain becomes incapacitating. Thought processing is no longer possible. Difficult to remember your own name. Control of movement and coordination are lost.   The worst pain imaginable 10 At this level, most patients pass out from pain. When this level is reached, collapse of the autonomic nervous system occurs, leading to a sudden drop in blood pressure and heart rate. This in turn results in a temporary and dramatic drop in blood flow to the brain, leading to a loss of consciousness. Fainting is one of the body's self defense  mechanisms. Passing out puts the brain in a calmed state and causes it to shut down for a while, in order to begin the  healing process.    Summary: 1. Refer to this scale when providing [place] with your pain level. 2. Be accurate and careful when reporting your pain level. This will help with your care. 3. Over-reporting your pain level will lead to loss of credibility. 4. Even a level of 1/10 means that there is pain and will be treated at our facility. 5. High, inaccurate reporting will be documented as "Symptom Exaggeration", leading to loss of credibility and suspicions of possible secondary gains such as obtaining more narcotics, or wanting to appear disabled, for fraudulent reasons. 6. Only pain levels of 5 or below will be seen at our facility. 7. Pain levels of 6 and above will be sent to the Emergency Department and the appointment cancelled. ____________________________________________________________________________________________

## 2017-06-01 NOTE — Progress Notes (Signed)
Safety precautions to be maintained throughout the outpatient stay will include: orient to surroundings, keep bed in low position, maintain call bell within reach at all times, provide assistance with transfer out of bed and ambulation.  

## 2017-06-11 ENCOUNTER — Encounter: Payer: 59 | Admitting: Nurse Practitioner

## 2017-06-11 ENCOUNTER — Ambulatory Visit: Payer: 59 | Admitting: Nurse Practitioner

## 2017-06-22 ENCOUNTER — Ambulatory Visit: Payer: 59 | Admitting: Pain Medicine

## 2017-11-29 ENCOUNTER — Other Ambulatory Visit: Payer: Self-pay | Admitting: Pain Medicine

## 2017-11-29 DIAGNOSIS — M792 Neuralgia and neuritis, unspecified: Secondary | ICD-10-CM

## 2018-03-12 DIAGNOSIS — I878 Other specified disorders of veins: Secondary | ICD-10-CM | POA: Insufficient documentation

## 2018-04-28 ENCOUNTER — Other Ambulatory Visit: Payer: Self-pay | Admitting: Podiatry

## 2018-04-28 DIAGNOSIS — D489 Neoplasm of uncertain behavior, unspecified: Secondary | ICD-10-CM

## 2018-05-06 ENCOUNTER — Ambulatory Visit
Admission: RE | Admit: 2018-05-06 | Discharge: 2018-05-06 | Disposition: A | Discharge status: Home / Self Care | Payer: POS | Source: Ambulatory Visit | Attending: Podiatry | Admitting: Podiatry

## 2018-05-06 DIAGNOSIS — S86212A Strain of muscle(s) and tendon(s) of anterior muscle group at lower leg level, left leg, initial encounter: Secondary | ICD-10-CM | POA: Diagnosis not present

## 2018-05-06 DIAGNOSIS — X58XXXA Exposure to other specified factors, initial encounter: Secondary | ICD-10-CM | POA: Insufficient documentation

## 2018-05-06 DIAGNOSIS — M722 Plantar fascial fibromatosis: Secondary | ICD-10-CM | POA: Diagnosis not present

## 2018-05-06 DIAGNOSIS — D489 Neoplasm of uncertain behavior, unspecified: Secondary | ICD-10-CM | POA: Diagnosis present

## 2018-05-06 DIAGNOSIS — M19072 Primary osteoarthritis, left ankle and foot: Secondary | ICD-10-CM | POA: Diagnosis not present

## 2018-05-06 LAB — POCT I-STAT CREATININE: Creatinine, Ser: 1.1 mg/dL (ref 0.61–1.24)

## 2018-05-06 MED ORDER — GADOBENATE DIMEGLUMINE 529 MG/ML IV SOLN
20.0000 mL | Freq: Once | INTRAVENOUS | Status: AC | PRN
  Administered 2018-05-06: 20 mL via INTRAVENOUS

## 2018-05-28 ENCOUNTER — Other Ambulatory Visit: Payer: Self-pay

## 2018-05-28 ENCOUNTER — Ambulatory Visit
Admission: EM | Admit: 2018-05-28 | Discharge: 2018-05-28 | Disposition: A | Discharge status: Home / Self Care | Payer: 59

## 2018-05-28 ENCOUNTER — Encounter: Payer: Self-pay | Admitting: Emergency Medicine

## 2018-05-28 DIAGNOSIS — H0289 Other specified disorders of eyelid: Secondary | ICD-10-CM | POA: Diagnosis not present

## 2018-05-28 DIAGNOSIS — H02844 Edema of left upper eyelid: Secondary | ICD-10-CM

## 2018-05-28 NOTE — Discharge Instructions (Addendum)
Go now to [facility] in [place] here for further evaluation.

## 2018-05-28 NOTE — ED Provider Notes (Signed)
[facility]    CSN: [id number] Arrival date & time: 05/28/18  0915     History   Chief Complaint Chief Complaint  Patient presents with  . Eye Pain    HPI Willie Krueger is a 62 y.o. male.   62 year old male presents with left upper eyelid swelling and pain that started about 1 week ago. He thought he may have a stye or something in his eye from working outside. Was swollen and slightly painful but then yesterday symptoms got worse. Woke up this morning with his eye crusted shut. Also noticed some bloody discharge from the eyelid. Had tried OTC clear eyes with minimal relief. Has history of DM, chronic pain, HTN, osteoarthritis, hyperlipidemia and on multiple medications for management. Does have glasses but has not seen the eye doctor in about 1 year. Also is allergic or has a reaction to multiple antibiotics.   The history is provided by the patient.    Past Medical History:  Diagnosis Date  . Acute postoperative pain 02/09/2017  . Chronic pain   . Chronic pain associated with significant psychosocial dysfunction 05/04/2015   Last Assessment & Plan:  He is having worsening problems with breakthrough pain. Gabapentin is increased from 800 mg 3 times daily to 1200 mg 3 times daily. Oxycodone is also renewed. He continues to through evaluation at [facility] pain clinic.   . Diabetes mellitus without complication ([place])   . Heart murmur   . History of spinal stenosis 10/31/2015   C3-4 with severe central canal stenosis with small amount of myelomalacia present. Moderate to severe canal stenosis is also present at C4-5 and C5-6. Moderate to severe neuroforaminal narrowing between the levels of C2-C7  . Hyperlipidemia   . Hypertension   . Leg weakness 07/07/2014  . Mitral valve regurgitation   . Nerve root inflammation 06/26/2014   Last Assessment & Plan:  Status is unchanged with ongoing pain and limited mobility. Continue with physical therapy and pain management.   .  Osteoarthritis     Patient Active Problem List   Diagnosis Date Noted  . Degeneration of lumbosacral intervertebral disc 06/01/2017  . History of spinal surgery 06/01/2017  . Synovial cyst of lumbar spine 06/01/2017  . Acute neck pain 06/01/2017  . Osteoarthritis of shoulders (B) (R>L) 06/01/2017  . Cervical spondylosis 06/01/2017  . Osteoarthritis 03/16/2017  . Enlarged thyroid 02/02/2017  . Chronic pain syndrome 12/04/2016  . Allergic drug rash due to anti-infective agent 11/28/2016  . Allergy to drug 11/21/2016  . Diabetic foot ulcer with osteomyelitis ([place]) 11/14/2016  . Diabetic infection of left foot ([place]) 10/13/2016  . Lumbar spondylosis 06/03/2016  . Lumbar facet syndrome (Location of Primary Source of Pain) (Bilateral) (R>L) 05/28/2016  . Abnormal MRI, lumbar spine (05/19/2016) 05/28/2016  . Neurogenic pain 02/27/2016  . Diabetic peripheral neuropathy ([place]) 02/27/2016  . Chronic shoulder pain (Location of Tertiary source of pain) (Bilateral) (R>L) 02/27/2016  . Type 2 diabetes mellitus with foot ulcer (CODE) ([place]) 01/23/2016  . Diabetic osteomyelitis ([place]) 12/05/2015  . Neuropathy 12/05/2015  . Obesity 12/05/2015  . Encounter for chronic pain management 12/03/2015  . Elevated C-reactive protein (CRP) 12/03/2015  . Long term current use of opiate analgesic 10/31/2015  . Long term prescription opiate use 10/31/2015  . Opiate use (67.5 MME/Day) 10/31/2015  . Opiate dependence ([place]) 10/31/2015  . Encounter for therapeutic drug level monitoring 10/31/2015  . Chronic neck pain (Right side) 10/31/2015  . Cervical post-laminectomy syndrome 10/31/2015  .  Chronic low back pain (Location of Primary Source of Pain) (Midline pain) (R>L) 10/31/2015  . Chronic cervical radicular pain (Bilateral) (R>L) 10/31/2015  . Chronic lumbar radicular pain (Bilateral) (R>L) 10/31/2015  . Substance use disorder Risk: LOW 10/31/2015  . 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 Chronic lower  extremity pain (Location of Secondary source of pain) (Bilateral) (R>L) 10/31/2015  . History of carpal tunnel release of both wrists 10/31/2015  . Grade I anterolisthesis of C7 on T1 10/31/2015  . Toe ulcer ([place]) 06/24/2015  . Cervical spinal cord compression ([place]) 02/07/2015  . MI (mitral incompetence) 11/11/2014  . Cardiac murmur 11/09/2014  . History of decompression of median nerve 05/19/2014  . HLD (hyperlipidemia) 10/20/2012  . Benign hypertension 10/20/2012  . 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 Chronic hepatitis C virus infection ([place]) 10/02/2011  . Adiposity 10/02/2011  . Current smoker 10/02/2011    Past Surgical History:  Procedure Laterality Date  . AMPUTATION TOE Left 10/2016   partial  . CARPAL TUNNEL RELEASE    . FOOT SURGERY Left 09/2016  . IRRIGATION AND DEBRIDEMENT FOOT Left 10/13/2016   Procedure: IRRIGATION AND DEBRIDEMENT FOOT;  Surgeon: Samara Deist, DPM;  Location: [facility] ORS;  Service: Podiatry;  Laterality: Left;  . PICC LINE PLACE PERIPHERAL ([place] HX) Left [id number]  . SPINE SURGERY         Home Medications    Prior to Admission medications   Medication Sig Start Date End Date Taking? Authorizing Provider  acetaminophen (TYLENOL) 500 MG tablet Take 500 mg by mouth every 6 (six) hours as needed.   Yes [provider]  aspirin EC 81 MG tablet Take 81 mg by mouth. 04/25/16  Yes [provider]  atorvastatin (LIPITOR) 40 MG tablet Take 40 mg by mouth daily.   Yes [provider]  Cholecalciferol (VITAMIN D3) 2000 units capsule Take 2,000 Units by mouth daily.  01/23/16  Yes [provider]  furosemide (LASIX) 20 MG tablet Take 40 mg by mouth. 04/25/16  Yes [provider]  gabapentin (NEURONTIN) 600 MG tablet Take 2 tablets (1,200 mg total) by mouth every 6 (six) hours. 06/01/17 05/28/18 Yes Milinda Pointer, MD  glimepiride (AMARYL) 2 MG tablet Take 2 mg by mouth daily with breakfast.    Yes  [provider]  glipiZIDE (GLUCOTROL XL) 10 MG 24 hr tablet Take by mouth. 04/19/12  Yes [provider]  lisinopril (PRINIVIL,ZESTRIL) 40 MG tablet Take 40 mg by mouth daily. 03/08/16  Yes [provider]  metFORMIN (GLUCOPHAGE-XR) 500 MG 24 hr tablet Take 500 mg by mouth 2 (two) times daily. 12/03/15  Yes [provider]  oxyCODONE-acetaminophen (PERCOCET) 10-325 MG tablet Take 1 tablet by mouth every 4 (four) hours as needed for pain.   Yes [provider]  [facility] BISACODYL 5 MG EC tablet 5 mg daily as needed.  10/16/16   [provider]  sildenafil (VIAGRA) 100 MG tablet Take 100 mg by mouth daily as needed for erectile dysfunction.    [provider]    Family History Family History  Problem Relation Age of Onset  . Cancer Mother   . Diabetes Mother   . Heart disease Father     Social History Social History   Tobacco Use  . Smoking status: Current Every Day Smoker    Packs/day: 1.50    Types: Cigarettes  . Smokeless tobacco: Never Used  Substance Use Topics  . Alcohol use: No  Alcohol/week: 0.0 oz  . Drug use: No     Allergies   Piperacillin-tazobactam in dex; Cleocin [clindamycin]; Clindamycin phosphate; Other; Sulfa antibiotics; and Zosyn [piperacillin sod-tazobactam so]   Review of Systems Review of Systems  Constitutional: Negative for chills, fatigue and fever.  HENT: Negative for congestion, ear discharge, ear pain, postnasal drip, sinus pressure, sinus pain and sore throat.   Eyes: Positive for photophobia, pain, discharge and visual disturbance. Negative for redness and itching.  Respiratory: Positive for cough.   Musculoskeletal: Positive for back pain.  Skin: Positive for color change.  Neurological: Positive for light-headedness and headaches. Negative for dizziness, syncope, weakness and numbness.  Hematological: Negative for adenopathy. Does not bruise/bleed easily.     Physical Exam Triage  Vital Signs ED Triage Vitals  Enc Vitals Group     BP 05/28/18 0956 138/87     Pulse Rate 05/28/18 0956 71     Resp 05/28/18 0956 (!) 22     Temp 05/28/18 0956 98.3 F (36.8 C)     Temp Source 05/28/18 0956 Oral     SpO2 05/28/18 0956 96 %     Weight 05/28/18 0951 239 lb (108.4 kg)     Height 05/28/18 0951 6\' 2"  (1.88 m)     Head Circumference --      Peak Flow --      Pain Score 05/28/18 0951 5     Pain Loc --      Pain Edu? --      Excl. in [place]? --    No data found.  Updated Vital Signs BP 138/87 (BP Location: Left Arm)   Pulse 71   Temp 98.3 F (36.8 C) (Oral)   Resp (!) 22   Ht 6\' 2"  (1.88 m)   Wt 239 lb (108.4 kg)   SpO2 96%   BMI 30.69 kg/m   Visual Acuity Right Eye Distance:   Left Eye Distance:   Bilateral Distance:    Right Eye Near:   Left Eye Near:    Bilateral Near:     Physical Exam  Constitutional: He is oriented to person, place, and time. He appears well-developed and well-nourished. No distress.  HENT:  Head: Normocephalic and atraumatic.  Right Ear: External ear normal.  Left Ear: External ear normal.  Nose: Nose normal.  Eyes: Pupils are equal, round, and reactive to light. EOM are normal. Left eye exhibits discharge. No foreign body present in the left eye. Right conjunctiva is not injected. Right conjunctiva has no hemorrhage. Left conjunctiva is injected (slightly). Left conjunctiva has no hemorrhage.    Left upper eyelid red, hard and swollen. Unable to evert eyelid due to pain and swelling. Very tender to palpation. Conjunctiva slightly injected but has normal EOM and pupils appear normal.   Neurological: He is alert and oriented to person, place, and time.  Skin: No rash noted. There is erythema.  Psychiatric: He has a normal mood and affect. His behavior is normal.     UC Treatments / Results  Labs (all labs ordered are listed, but only abnormal results are displayed) Labs Reviewed - No data to  display  EKG None  Radiology No results found.  Procedures Procedures (including critical care time)  Medications Ordered in UC Medications - No data to display  Initial Impression / Assessment and Plan / UC Course  I have reviewed the triage vital signs and the nursing notes.  Pertinent labs & imaging results that were available during my care  of the patient were reviewed by me and considered in my medical decision making (see chart for details).     Called Dr. Edison Pace with [facility] in [place] to discuss clinical findings- he offered to see this patient ASAP in the [facility] office. Patient understands and will go to [facility] now for further evaluation.  Final Clinical Impressions(s) / UC Diagnoses   Final diagnoses:  Pain and swelling of upper eyelid of left eye     Discharge Instructions     Go now to [facility] in [place] here for further evaluation.     ED Prescriptions    None     Controlled Substance Prescriptions  Controlled Substance Registry consulted? No   Katy Apo, NP 05/28/18 1108

## 2018-05-28 NOTE — ED Triage Notes (Addendum)
Patient states his left eye began swelling and developed what he thought was a stye approximately last weekend.  His eye has continued to swell and get worse

## 2018-10-12 ENCOUNTER — Other Ambulatory Visit (HOSPITAL_COMMUNITY)
Admission: RE | Admit: 2018-10-12 | Discharge: 2018-10-12 | Disposition: A | Discharge status: Home / Self Care | Payer: POS | Source: Ambulatory Visit | Attending: Physical Medicine and Rehabilitation | Admitting: Physical Medicine and Rehabilitation

## 2018-10-12 DIAGNOSIS — M48061 Spinal stenosis, lumbar region without neurogenic claudication: Secondary | ICD-10-CM | POA: Insufficient documentation

## 2018-10-12 LAB — BUN: BUN: 17 mg/dL (ref 8–23)

## 2018-10-12 LAB — CREATININE, SERUM
CREATININE: 1.02 mg/dL (ref 0.61–1.24)
GFR calc Af Amer: >60 mL/min (ref >60)

## 2019-12-20 ENCOUNTER — Other Ambulatory Visit: Payer: Self-pay | Admitting: Orthopedic Surgery

## 2020-01-20 ENCOUNTER — Ambulatory Visit: Admit: 2020-01-20 | Payer: POS | Admitting: Orthopedic Surgery

## 2020-01-20 SURGERY — ARTHROPLASTY, KNEE, TOTAL
Anesthesia: Spinal | Site: Knee | Laterality: Left

## 2020-04-13 DIAGNOSIS — M170 Bilateral primary osteoarthritis of knee: Secondary | ICD-10-CM | POA: Insufficient documentation

## 2020-08-13 DIAGNOSIS — E663 Overweight: Secondary | ICD-10-CM | POA: Insufficient documentation

## 2022-01-28 ENCOUNTER — Other Ambulatory Visit: Payer: Self-pay | Admitting: Orthopedic Surgery

## 2022-02-03 NOTE — Progress Notes (Signed)
Pt. Needs orders for upcoming surgery. ?

## 2022-02-03 NOTE — Patient Instructions (Addendum)
DUE TO COVID-19 ONLY ONE VISITOR IS ALLOWED TO COME WITH YOU AND STAY IN THE WAITING ROOM ONLY DURING PRE OP AND PROCEDURE.   **NO VISITORS ARE ALLOWED IN THE SHORT STAY AREA OR RECOVERY ROOM!!**  IF YOU WILL BE ADMITTED INTO THE HOSPITAL YOU ARE ALLOWED ONLY TWO SUPPORT PEOPLE DURING VISITATION HOURS ONLY (7 AM -8PM)   The support person(s) must pass our screening, gel in and out, and wear a mask at all times, including in the patients room. Patients must also wear a mask when staff or their support person are in the room. Visitors GUEST BADGE MUST BE WORN VISIBLY  One adult visitor may remain with you overnight and MUST be in the room by 8 P.M.  No visitors under the age of 41. Any visitor under the age of 51 must be accompanied by an adult.    COVID SWAB TESTING MUST BE COMPLETED ON:  02/12/22   Site: [facility] [place] Enter: Main Entrance have a seat in the waiting area to the right of main entrance (DO NOT [place]!!!!!) Dial: [phone] to alert staff you have arrived  You are not required to quarantine, however you are required to wear a well-fitted mask when you are out and around people not in your household.  Hand Hygiene often Do NOT share personal items Notify your provider if you are in close contact with someone who has COVID or you develop fever 100.4 or greater, new onset of sneezing, cough, sore throat, shortness of breath or body aches.  [place], [address], must go inside of the hospital, NOT A DRIVE THRU!  (Must self quarantine after testing. Follow instructions on handout.)       Your procedure is scheduled on: 02/14/22   Report to [facility] Main Entrance    Report to admitting at : 9:45 AM   Call this number if you have problems the morning of surgery [phone]   Do not eat food :After Midnight.   May have liquids until : 9:30 AM    day of surgery  CLEAR LIQUID DIET  Foods Allowed                                                                     Foods Excluded  Water, Black Coffee and tea, regular and decaf                             liquids that you cannot  Plain Jell-O in any flavor  (No red)                                           see through such as: Fruit ices (not with fruit pulp)                                     milk, soups, orange juice  Iced Popsicles (No red)                                    All solid food                                   Apple juices Sports drinks like Gatorade (No red) Lightly seasoned clear broth or consume(fat free) Sugar Sample Menu Breakfast                                Lunch                                     Supper Cranberry juice                    Beef broth                            Chicken broth Jell-O                                     Grape juice                           Apple juice Coffee or tea                        Jell-O                                      Popsicle                                                Coffee or tea                        Coffee or tea    Oral Hygiene is also important to reduce your risk of infection.                                    Remember - BRUSH YOUR TEETH THE MORNING OF SURGERY WITH YOUR REGULAR TOOTHPASTE   Do NOT smoke after Midnight   Take these medicines the morning of surgery with A SIP OF WATER:  How to Manage Your Diabetes Before and After Surgery  Why is it important to control my blood sugar before and after surgery? Improving blood sugar levels before and after surgery helps healing and can limit problems. A way of improving blood sugar control is eating a healthy diet by:  Eating less sugar and carbohydrates  Increasing activity/exercise  Talking with your doctor about reaching your blood sugar goals High blood sugars (greater than 180 mg/dL) can raise your risk of infections and slow  your recovery, so you will need  to focus on controlling your diabetes during the weeks before surgery. Make sure that the doctor who takes care of your diabetes knows about your planned surgery including the date and location.  How do I manage my blood sugar before surgery? Check your blood sugar at least 4 times a day, starting 2 days before surgery, to make sure that the level is not too high or low. Check your blood sugar the morning of your surgery when you wake up and every 2 hours until you get to the Short Stay unit. If your blood sugar is less than 70 mg/dL, you will need to treat for low blood sugar: Do not take insulin. Treat a low blood sugar (less than 70 mg/dL) with  cup of clear juice (cranberry or apple), 4 glucose tablets, OR glucose gel. Recheck blood sugar in 15 minutes after treatment (to make sure it is greater than 70 mg/dL). If your blood sugar is not greater than 70 mg/dL on recheck, call [phone] for further instructions. Report your blood sugar to the short stay nurse when you get to Short Stay.  If you are admitted to the hospital after surgery: Your blood sugar will be checked by the staff and you will probably be given insulin after surgery (instead of oral diabetes medicines) to make sure you have good blood sugar levels. The goal for blood sugar control after surgery is 80-180 mg/dL.   WHAT DO I DO ABOUT MY DIABETES MEDICATION?  Do not take oral diabetes medicines (pills) the morning of surgery.  THE NIGHT BEFORE SURGERY, take amaryl and metformin as usual.      THE MORNING OF SURGERY, DO NOT TAKE ANY ORAL DIABETIC MEDICATIONS DAY OF YOUR SURGERY                              You may not have any metal on your body including hair pins, jewelry, and body piercing             Do not wear lotions, powders, perfumes/cologne, or deodorant              Men may shave face and neck.   Do not bring valuables to the hospital. [place].   Contacts, dentures or bridgework may not be worn into surgery.   Bring small overnight bag day of surgery.    Patients discharged on the day of surgery will not be allowed to drive home.  Someone needs to stay with you for the first 24 hours after anesthesia.   Special Instructions: Bring a copy of your healthcare power of attorney and living will documents         the day of surgery if you haven't scanned them before.              Please read over the following fact sheets you were given: IF YOU HAVE QUESTIONS ABOUT YOUR PRE-OP INSTRUCTIONS PLEASE CALL [phone]     [facility] - Preparing for Surgery Before surgery, you can play an important role.  Because skin is not sterile, your skin needs to be as free of germs as possible.  You can reduce the number of germs on your skin by washing with CHG (chlorahexidine gluconate) soap before surgery.  CHG is an antiseptic cleaner which kills germs and bonds with the skin  to continue killing germs even after washing. Please DO NOT use if you have an allergy to CHG or antibacterial soaps.  If your skin becomes reddened/irritated stop using the CHG and inform your nurse when you arrive at Short Stay. Do not shave (including legs and underarms) for at least 48 hours prior to the first CHG shower.  You may shave your face/neck. Please follow these instructions carefully:  1.  Shower with CHG Soap the night before surgery and the  morning of Surgery.  2.  If you choose to wash your hair, wash your hair first as usual with your  normal  shampoo.  3.  After you shampoo, rinse your hair and body thoroughly to remove the  shampoo.                           4.  Use CHG as you would any other liquid soap.  You can apply chg directly  to the skin and wash                       Gently with a scrungie or clean washcloth.  5.  Apply the CHG Soap to your body ONLY FROM THE NECK DOWN.   Do not use on face/ open                            Wound or open sores. Avoid contact with eyes, ears mouth and genitals (private parts).                       Wash face,  Genitals (private parts) with your normal soap.             6.  Wash thoroughly, paying special attention to the area where your surgery  will be performed.  7.  Thoroughly rinse your body with warm water from the neck down.  8.  DO NOT shower/wash with your normal soap after using and rinsing off  the CHG Soap.                9.  Pat yourself dry with a clean towel.            10.  Wear clean pajamas.            11.  Place clean sheets on your bed the night of your first shower and do not  sleep with pets. Day of Surgery : Do not apply any lotions/deodorants the morning of surgery.  Please wear clean clothes to the hospital/surgery center.  FAILURE TO FOLLOW THESE INSTRUCTIONS MAY RESULT IN THE CANCELLATION OF YOUR SURGERY PATIENT SIGNATURE_________________________________  NURSE SIGNATURE__________________________________  ________________________________________________________________________

## 2022-02-04 ENCOUNTER — Encounter (HOSPITAL_COMMUNITY)
Admission: RE | Admit: 2022-02-04 | Discharge: 2022-02-04 | Disposition: A | Discharge status: Home / Self Care | Payer: POS | Source: Ambulatory Visit | Attending: Orthopedic Surgery | Admitting: Orthopedic Surgery

## 2022-02-04 ENCOUNTER — Encounter (HOSPITAL_COMMUNITY): Payer: Self-pay

## 2022-02-04 ENCOUNTER — Other Ambulatory Visit: Payer: Self-pay

## 2022-02-04 VITALS — BP 129/82 | HR 84 | Temp 98.6°F | Ht 74.0 in | Wt 200.0 lb

## 2022-02-04 DIAGNOSIS — Z01818 Encounter for other preprocedural examination: Secondary | ICD-10-CM | POA: Diagnosis present

## 2022-02-04 DIAGNOSIS — E11621 Type 2 diabetes mellitus with foot ulcer: Secondary | ICD-10-CM | POA: Insufficient documentation

## 2022-02-04 LAB — CBC
HCT: 42.8 % (ref 39.0–52.0)
Hemoglobin: 13.7 g/dL (ref 13.0–17.0)
MCH: 31.7 pg (ref 26.0–34.0)
MCHC: 32 g/dL (ref 30.0–36.0)
MCV: 99.1 fL (ref 80.0–100.0)
Platelets: 174 10*3/uL (ref 150–400)
RBC: 4.32 MIL/uL (ref 4.22–5.81)
RDW: 14.6 % (ref 11.5–15.5)
WBC: 7.5 10*3/uL (ref 4.0–10.5)
nRBC: 0 % (ref 0.0–0.2)

## 2022-02-04 LAB — BASIC METABOLIC PANEL
Anion gap: 7 (ref 5–15)
BUN: 30 mg/dL — ABNORMAL HIGH (ref 8–23)
CO2: 22 mmol/L (ref 22–32)
Calcium: 9.3 mg/dL (ref 8.9–10.3)
Chloride: 105 mmol/L (ref 98–111)
Creatinine, Ser: 1.15 mg/dL (ref 0.61–1.24)
GFR, Estimated: >60 mL/min (ref >60)
Glucose, Bld: 94 mg/dL (ref 70–99)
Potassium: 4.5 mmol/L (ref 3.5–5.1)
Sodium: 134 mmol/L — ABNORMAL LOW (ref 135–145)

## 2022-02-04 LAB — SURGICAL PCR SCREEN
MRSA, PCR: NEGATIVE
Staphylococcus aureus: NEGATIVE

## 2022-02-04 LAB — GLUCOSE, CAPILLARY: Glucose-Capillary: 98 mg/dL (ref 70–99)

## 2022-02-04 LAB — HEMOGLOBIN A1C
Hgb A1c MFr Bld: 6.1 % — ABNORMAL HIGH (ref 4.8–5.6)
Mean Plasma Glucose: 128.37 mg/dL

## 2022-02-04 NOTE — Progress Notes (Signed)
COVID Vaccine Completed:Yes Date COVID Vaccine completed: 2022 x 3 COVID vaccine manufacturer:   Moderna    COVID Test: 02/12/22 @ 8:00 AM Bowel prep reminder: N/A  PCP - Dr. Danae Orleans Cardiologist -   Chest x-ray -  EKG -  Stress Test -  ECHO -  Cardiac Cath -  Pacemaker/ICD device last checked:  Sleep Study -  CPAP -   Fasting Blood Sugar -  Checks Blood Sugar _____ times a day  Blood Thinner Instructions: Aspirin Instructions: Last Dose:  Anesthesia review: Hx: HTN,DIA,Heart murmur,smoker.  Patient denies shortness of breath, fever, cough and chest pain at PAT appointment   Patient verbalized understanding of instructions that were given to them at the PAT appointment. Patient was also instructed that they will need to review over the PAT instructions again at home before surgery.

## 2022-02-07 ENCOUNTER — Encounter: Payer: Self-pay | Admitting: Podiatry

## 2022-02-07 ENCOUNTER — Other Ambulatory Visit: Payer: Self-pay

## 2022-02-07 ENCOUNTER — Ambulatory Visit (INDEPENDENT_AMBULATORY_CARE_PROVIDER_SITE_OTHER): Payer: POS | Admitting: Podiatry

## 2022-02-07 DIAGNOSIS — M79674 Pain in right toe(s): Secondary | ICD-10-CM

## 2022-02-07 DIAGNOSIS — B351 Tinea unguium: Secondary | ICD-10-CM | POA: Diagnosis not present

## 2022-02-07 DIAGNOSIS — M79675 Pain in left toe(s): Secondary | ICD-10-CM

## 2022-02-07 DIAGNOSIS — E0843 Diabetes mellitus due to underlying condition with diabetic autonomic (poly)neuropathy: Secondary | ICD-10-CM

## 2022-02-12 ENCOUNTER — Encounter (HOSPITAL_COMMUNITY)
Admission: RE | Admit: 2022-02-12 | Discharge: 2022-02-12 | Disposition: A | Discharge status: Home / Self Care | Payer: POS | Source: Ambulatory Visit | Attending: Orthopedic Surgery | Admitting: Orthopedic Surgery

## 2022-02-12 ENCOUNTER — Other Ambulatory Visit: Payer: Self-pay

## 2022-02-12 LAB — SARS CORONAVIRUS 2 (TAT 6-24 HRS): SARS Coronavirus 2: NEGATIVE

## 2022-02-14 ENCOUNTER — Inpatient Hospital Stay (HOSPITAL_BASED_OUTPATIENT_CLINIC_OR_DEPARTMENT_OTHER): Payer: POS | Admitting: Anesthesiology

## 2022-02-14 ENCOUNTER — Inpatient Hospital Stay (HOSPITAL_COMMUNITY): Payer: POS | Admitting: Physician Assistant

## 2022-02-14 ENCOUNTER — Encounter (HOSPITAL_COMMUNITY): Payer: Self-pay | Admitting: Orthopedic Surgery

## 2022-02-14 ENCOUNTER — Other Ambulatory Visit: Payer: Self-pay

## 2022-02-14 ENCOUNTER — Other Ambulatory Visit (HOSPITAL_COMMUNITY): Payer: Self-pay

## 2022-02-14 ENCOUNTER — Encounter (HOSPITAL_COMMUNITY)
Admission: RE | Disposition: A | Discharge status: Home / Self Care | Payer: Self-pay | Source: Home / Self Care | Attending: Orthopedic Surgery

## 2022-02-14 ENCOUNTER — Inpatient Hospital Stay (HOSPITAL_COMMUNITY)
Admission: RE | Admit: 2022-02-14 | Discharge: 2022-02-15 | DRG: 470 | Disposition: A | Discharge status: Home / Self Care | Payer: POS | Attending: Orthopedic Surgery | Admitting: Orthopedic Surgery

## 2022-02-14 DIAGNOSIS — Z7984 Long term (current) use of oral hypoglycemic drugs: Secondary | ICD-10-CM

## 2022-02-14 DIAGNOSIS — E663 Overweight: Secondary | ICD-10-CM | POA: Diagnosis present

## 2022-02-14 DIAGNOSIS — Z7982 Long term (current) use of aspirin: Secondary | ICD-10-CM

## 2022-02-14 DIAGNOSIS — B182 Chronic viral hepatitis C: Secondary | ICD-10-CM | POA: Diagnosis present

## 2022-02-14 DIAGNOSIS — Z79891 Long term (current) use of opiate analgesic: Secondary | ICD-10-CM | POA: Diagnosis not present

## 2022-02-14 DIAGNOSIS — E1142 Type 2 diabetes mellitus with diabetic polyneuropathy: Secondary | ICD-10-CM | POA: Diagnosis present

## 2022-02-14 DIAGNOSIS — Z882 Allergy status to sulfonamides status: Secondary | ICD-10-CM | POA: Diagnosis not present

## 2022-02-14 DIAGNOSIS — Z20822 Contact with and (suspected) exposure to covid-19: Secondary | ICD-10-CM | POA: Diagnosis present

## 2022-02-14 DIAGNOSIS — Z79899 Other long term (current) drug therapy: Secondary | ICD-10-CM | POA: Diagnosis not present

## 2022-02-14 DIAGNOSIS — Z8249 Family history of ischemic heart disease and other diseases of the circulatory system: Secondary | ICD-10-CM | POA: Diagnosis not present

## 2022-02-14 DIAGNOSIS — M1712 Unilateral primary osteoarthritis, left knee: Secondary | ICD-10-CM | POA: Diagnosis present

## 2022-02-14 DIAGNOSIS — Z01818 Encounter for other preprocedural examination: Secondary | ICD-10-CM

## 2022-02-14 DIAGNOSIS — D62 Acute posthemorrhagic anemia: Secondary | ICD-10-CM | POA: Diagnosis not present

## 2022-02-14 DIAGNOSIS — E785 Hyperlipidemia, unspecified: Secondary | ICD-10-CM | POA: Diagnosis present

## 2022-02-14 DIAGNOSIS — Z833 Family history of diabetes mellitus: Secondary | ICD-10-CM

## 2022-02-14 DIAGNOSIS — E119 Type 2 diabetes mellitus without complications: Secondary | ICD-10-CM | POA: Diagnosis not present

## 2022-02-14 DIAGNOSIS — F17200 Nicotine dependence, unspecified, uncomplicated: Secondary | ICD-10-CM | POA: Diagnosis present

## 2022-02-14 DIAGNOSIS — Z881 Allergy status to other antibiotic agents status: Secondary | ICD-10-CM

## 2022-02-14 DIAGNOSIS — I10 Essential (primary) hypertension: Secondary | ICD-10-CM | POA: Diagnosis present

## 2022-02-14 DIAGNOSIS — Z89422 Acquired absence of other left toe(s): Secondary | ICD-10-CM | POA: Diagnosis not present

## 2022-02-14 DIAGNOSIS — M25762 Osteophyte, left knee: Secondary | ICD-10-CM | POA: Diagnosis present

## 2022-02-14 DIAGNOSIS — G894 Chronic pain syndrome: Secondary | ICD-10-CM | POA: Diagnosis present

## 2022-02-14 DIAGNOSIS — Z6825 Body mass index (BMI) 25.0-25.9, adult: Secondary | ICD-10-CM

## 2022-02-14 HISTORY — PX: TOTAL KNEE ARTHROPLASTY: SHX125

## 2022-02-14 LAB — GLUCOSE, CAPILLARY
Glucose-Capillary: 115 mg/dL — ABNORMAL HIGH (ref 70–99)
Glucose-Capillary: 155 mg/dL — ABNORMAL HIGH (ref 70–99)
Glucose-Capillary: 169 mg/dL — ABNORMAL HIGH (ref 70–99)
Glucose-Capillary: 202 mg/dL — ABNORMAL HIGH (ref 70–99)

## 2022-02-14 LAB — ABO/RH: ABO/RH(D): A POS

## 2022-02-14 LAB — TYPE AND SCREEN
ABO/RH(D): A POS
Antibody Screen: NEGATIVE

## 2022-02-14 SURGERY — ARTHROPLASTY, KNEE, TOTAL
Anesthesia: Monitor Anesthesia Care | Site: Knee | Laterality: Left

## 2022-02-14 MED ORDER — ACETAMINOPHEN 10 MG/ML IV SOLN: 1000.0000 mg | Freq: Once | INTRAVENOUS | Status: DC | PRN

## 2022-02-14 MED ORDER — FUROSEMIDE 40 MG PO TABS
40.0000 mg | ORAL_TABLET | Freq: Two times a day (BID) | ORAL | Status: DC
  Administered 2022-02-14 – 2022-02-15 (×2): 40 mg via ORAL
  Filled 2022-02-14 (×2): qty 1

## 2022-02-14 MED ORDER — TRANEXAMIC ACID-NACL 1000-0.7 MG/100ML-% IV SOLN
1000.0000 mg | INTRAVENOUS | Status: AC
  Administered 2022-02-14: 1000 mg via INTRAVENOUS
  Filled 2022-02-14: qty 100

## 2022-02-14 MED ORDER — ACETAMINOPHEN 500 MG PO TABS: 1000.0000 mg | ORAL_TABLET | Freq: Once | ORAL | Status: DC | PRN

## 2022-02-14 MED ORDER — MENTHOL 3 MG MT LOZG: 1.0000 | LOZENGE | OROMUCOSAL | Status: DC | PRN

## 2022-02-14 MED ORDER — FENTANYL CITRATE (PF) 100 MCG/2ML IJ SOLN
INTRAMUSCULAR | Status: AC
  Filled 2022-02-14: qty 2

## 2022-02-14 MED ORDER — ASPIRIN EC 325 MG PO TBEC
325.0000 mg | DELAYED_RELEASE_TABLET | Freq: Two times a day (BID) | ORAL | Status: DC
  Administered 2022-02-15: 325 mg via ORAL
  Filled 2022-02-14: qty 1

## 2022-02-14 MED ORDER — ZOLPIDEM TARTRATE 5 MG PO TABS
5.0000 mg | ORAL_TABLET | Freq: Every evening | ORAL | Status: DC | PRN
  Administered 2022-02-14: 5 mg via ORAL
  Filled 2022-02-14: qty 1

## 2022-02-14 MED ORDER — DIPHENHYDRAMINE HCL 12.5 MG/5ML PO ELIX: 12.5000 mg | ORAL_SOLUTION | ORAL | Status: DC | PRN

## 2022-02-14 MED ORDER — ATORVASTATIN CALCIUM 40 MG PO TABS
40.0000 mg | ORAL_TABLET | Freq: Every day | ORAL | Status: DC
  Administered 2022-02-15: 40 mg via ORAL
  Filled 2022-02-14: qty 1

## 2022-02-14 MED ORDER — FENTANYL CITRATE (PF) 100 MCG/2ML IJ SOLN
INTRAMUSCULAR | Status: DC | PRN
  Administered 2022-02-14 (×2): 50 ug via INTRAVENOUS

## 2022-02-14 MED ORDER — DEXAMETHASONE SODIUM PHOSPHATE 10 MG/ML IJ SOLN
INTRAMUSCULAR | Status: DC | PRN
  Administered 2022-02-14: 10 mg

## 2022-02-14 MED ORDER — PROPOFOL 10 MG/ML IV BOLUS
INTRAVENOUS | Status: AC
  Filled 2022-02-14: qty 20

## 2022-02-14 MED ORDER — GLIMEPIRIDE 2 MG PO TABS
2.0000 mg | ORAL_TABLET | Freq: Every day | ORAL | Status: DC
  Administered 2022-02-15: 2 mg via ORAL
  Filled 2022-02-14: qty 1

## 2022-02-14 MED ORDER — INSULIN ASPART 100 UNIT/ML IJ SOLN: 0.0000 [IU] | Freq: Three times a day (TID) | INTRAMUSCULAR | Status: DC

## 2022-02-14 MED ORDER — CEPHALEXIN 500 MG PO CAPS
500.0000 mg | ORAL_CAPSULE | Freq: Three times a day (TID) | ORAL | 0 refills | Status: AC
  Filled 2022-02-14: qty 21, 7d supply, fill #0

## 2022-02-14 MED ORDER — SODIUM CHLORIDE 0.9% FLUSH
INTRAVENOUS | Status: DC | PRN
  Administered 2022-02-14: 50 mL via INTRAVENOUS

## 2022-02-14 MED ORDER — CHLORHEXIDINE GLUCONATE 0.12 % MT SOLN
15.0000 mL | Freq: Once | OROMUCOSAL | Status: AC
  Administered 2022-02-14: 15 mL via OROMUCOSAL

## 2022-02-14 MED ORDER — ONDANSETRON HCL 4 MG/2ML IJ SOLN: 4.0000 mg | Freq: Four times a day (QID) | INTRAMUSCULAR | Status: DC | PRN

## 2022-02-14 MED ORDER — OXYCODONE HCL 5 MG PO TABS
5.0000 mg | ORAL_TABLET | ORAL | Status: DC | PRN
  Administered 2022-02-14 – 2022-02-15 (×4): 10 mg via ORAL
  Filled 2022-02-14 (×4): qty 2

## 2022-02-14 MED ORDER — PROPOFOL 500 MG/50ML IV EMUL
INTRAVENOUS | Status: AC
  Filled 2022-02-14: qty 50

## 2022-02-14 MED ORDER — TRANEXAMIC ACID-NACL 1000-0.7 MG/100ML-% IV SOLN
1000.0000 mg | Freq: Once | INTRAVENOUS | Status: AC
  Administered 2022-02-14: 1000 mg via INTRAVENOUS
  Filled 2022-02-14: qty 100

## 2022-02-14 MED ORDER — OXYCODONE HCL 5 MG PO TABS: 5.0000 mg | ORAL_TABLET | Freq: Once | ORAL | Status: DC | PRN

## 2022-02-14 MED ORDER — HYDROMORPHONE HCL 1 MG/ML IJ SOLN
0.5000 mg | INTRAMUSCULAR | Status: DC | PRN
  Administered 2022-02-14 – 2022-02-15 (×3): 1 mg via INTRAVENOUS
  Filled 2022-02-14 (×3): qty 1

## 2022-02-14 MED ORDER — GUAIFENESIN ER 600 MG PO TB12
600.0000 mg | ORAL_TABLET | ORAL | Status: DC | PRN
  Administered 2022-02-14: 600 mg via ORAL
  Filled 2022-02-14: qty 1

## 2022-02-14 MED ORDER — LIDOCAINE HCL (PF) 2 % IJ SOLN
INTRAMUSCULAR | Status: DC | PRN
  Administered 2022-02-14: 20 mg via INTRADERMAL

## 2022-02-14 MED ORDER — PROPOFOL 10 MG/ML IV BOLUS
INTRAVENOUS | Status: DC | PRN
  Administered 2022-02-14: 20 mg via INTRAVENOUS
  Administered 2022-02-14: 30 mg via INTRAVENOUS
  Administered 2022-02-14: 20 mg via INTRAVENOUS

## 2022-02-14 MED ORDER — PHENYLEPHRINE 40 MCG/ML (10ML) SYRINGE FOR IV PUSH (FOR BLOOD PRESSURE SUPPORT)
PREFILLED_SYRINGE | INTRAVENOUS | Status: AC
  Filled 2022-02-14: qty 10

## 2022-02-14 MED ORDER — ONDANSETRON HCL 4 MG PO TABS: 4.0000 mg | ORAL_TABLET | Freq: Four times a day (QID) | ORAL | Status: DC | PRN

## 2022-02-14 MED ORDER — PHENOL 1.4 % MT LIQD: 1.0000 | OROMUCOSAL | Status: DC | PRN

## 2022-02-14 MED ORDER — PHENYLEPHRINE 40 MCG/ML (10ML) SYRINGE FOR IV PUSH (FOR BLOOD PRESSURE SUPPORT)
PREFILLED_SYRINGE | INTRAVENOUS | Status: DC | PRN
Start: 2022-02-14 — End: 2022-02-14
  Administered 2022-02-14: 80 ug via INTRAVENOUS
  Administered 2022-02-14: 40 ug via INTRAVENOUS
  Administered 2022-02-14 (×2): 80 ug via INTRAVENOUS
  Administered 2022-02-14: 120 ug via INTRAVENOUS

## 2022-02-14 MED ORDER — ORAL CARE MOUTH RINSE: 15.0000 mL | Freq: Once | OROMUCOSAL | Status: AC

## 2022-02-14 MED ORDER — BUPIVACAINE HCL (PF) 0.5 % IJ SOLN
INTRAMUSCULAR | Status: DC | PRN
  Administered 2022-02-14: 30 mL via PERINEURAL

## 2022-02-14 MED ORDER — LACTATED RINGERS IV SOLN: INTRAVENOUS | Status: DC

## 2022-02-14 MED ORDER — ACETAMINOPHEN 10 MG/ML IV SOLN
INTRAVENOUS | Status: DC | PRN
  Administered 2022-02-14: 1000 mg via INTRAVENOUS

## 2022-02-14 MED ORDER — DOCUSATE SODIUM 100 MG PO CAPS
100.0000 mg | ORAL_CAPSULE | Freq: Two times a day (BID) | ORAL | 0 refills | Status: DC
  Filled 2022-02-14: qty 30, 15d supply, fill #0

## 2022-02-14 MED ORDER — ONDANSETRON HCL 4 MG/2ML IJ SOLN
INTRAMUSCULAR | Status: DC | PRN
  Administered 2022-02-14: 4 mg via INTRAVENOUS

## 2022-02-14 MED ORDER — PROPOFOL 10 MG/ML IV BOLUS
INTRAVENOUS | Status: AC
Start: 2022-02-14 — End: ?
  Filled 2022-02-14: qty 20

## 2022-02-14 MED ORDER — GABAPENTIN 400 MG PO CAPS
800.0000 mg | ORAL_CAPSULE | Freq: Four times a day (QID) | ORAL | Status: DC
  Administered 2022-02-14 – 2022-02-15 (×4): 800 mg via ORAL
  Filled 2022-02-14 (×4): qty 2

## 2022-02-14 MED ORDER — METHOCARBAMOL 1000 MG/10ML IJ SOLN
500.0000 mg | Freq: Four times a day (QID) | INTRAVENOUS | Status: DC | PRN
  Filled 2022-02-14: qty 5

## 2022-02-14 MED ORDER — OXYCODONE-ACETAMINOPHEN 5-325 MG PO TABS
1.0000 | ORAL_TABLET | Freq: Four times a day (QID) | ORAL | 0 refills | Status: DC | PRN
Start: 2022-02-14 — End: 2023-02-13
  Filled 2022-02-14: qty 40, 5d supply, fill #0

## 2022-02-14 MED ORDER — DOCUSATE SODIUM 100 MG PO CAPS
100.0000 mg | ORAL_CAPSULE | Freq: Two times a day (BID) | ORAL | Status: DC
  Administered 2022-02-14 – 2022-02-15 (×2): 100 mg via ORAL
  Filled 2022-02-14 (×2): qty 1

## 2022-02-14 MED ORDER — BISACODYL 5 MG PO TBEC: 5.0000 mg | DELAYED_RELEASE_TABLET | Freq: Every day | ORAL | Status: DC | PRN

## 2022-02-14 MED ORDER — CEFAZOLIN SODIUM-DEXTROSE 2-4 GM/100ML-% IV SOLN
2.0000 g | Freq: Four times a day (QID) | INTRAVENOUS | Status: AC
  Administered 2022-02-14 – 2022-02-15 (×3): 2 g via INTRAVENOUS
  Filled 2022-02-14 (×3): qty 100

## 2022-02-14 MED ORDER — VANCOMYCIN HCL IN DEXTROSE 1-5 GM/200ML-% IV SOLN
1000.0000 mg | INTRAVENOUS | Status: AC
  Administered 2022-02-14: 1000 mg via INTRAVENOUS
  Filled 2022-02-14: qty 200

## 2022-02-14 MED ORDER — OXYCODONE HCL 5 MG/5ML PO SOLN: 5.0000 mg | Freq: Once | ORAL | Status: DC | PRN

## 2022-02-14 MED ORDER — FENTANYL CITRATE PF 50 MCG/ML IJ SOSY
50.0000 ug | PREFILLED_SYRINGE | INTRAMUSCULAR | Status: DC
  Filled 2022-02-14: qty 2

## 2022-02-14 MED ORDER — METFORMIN HCL ER 500 MG PO TB24
500.0000 mg | ORAL_TABLET | Freq: Two times a day (BID) | ORAL | Status: DC
  Administered 2022-02-15: 500 mg via ORAL
  Filled 2022-02-14: qty 1

## 2022-02-14 MED ORDER — BUPIVACAINE LIPOSOME 1.3 % IJ SUSP
INTRAMUSCULAR | Status: DC | PRN
  Administered 2022-02-14: 20 mL

## 2022-02-14 MED ORDER — ASPIRIN EC 325 MG PO TBEC
325.0000 mg | DELAYED_RELEASE_TABLET | Freq: Two times a day (BID) | ORAL | 0 refills | Status: DC
  Filled 2022-02-14: qty 60, 30d supply, fill #0

## 2022-02-14 MED ORDER — BUPIVACAINE-EPINEPHRINE (PF) 0.25% -1:200000 IJ SOLN
INTRAMUSCULAR | Status: AC
  Filled 2022-02-14: qty 30

## 2022-02-14 MED ORDER — ACETAMINOPHEN 325 MG PO TABS: 325.0000 mg | ORAL_TABLET | Freq: Four times a day (QID) | ORAL | Status: DC | PRN

## 2022-02-14 MED ORDER — MELATONIN 3 MG PO TABS
3.0000 mg | ORAL_TABLET | Freq: Every day | ORAL | Status: DC
  Administered 2022-02-15: 3 mg via ORAL
  Filled 2022-02-14: qty 1

## 2022-02-14 MED ORDER — METHOCARBAMOL 500 MG PO TABS
500.0000 mg | ORAL_TABLET | Freq: Four times a day (QID) | ORAL | Status: DC | PRN
  Administered 2022-02-15: 500 mg via ORAL
  Filled 2022-02-14: qty 1

## 2022-02-14 MED ORDER — TIZANIDINE HCL 2 MG PO TABS
2.0000 mg | ORAL_TABLET | Freq: Three times a day (TID) | ORAL | 0 refills | Status: DC | PRN
  Filled 2022-02-14: qty 40, 14d supply, fill #0

## 2022-02-14 MED ORDER — ONDANSETRON HCL 4 MG/2ML IJ SOLN
INTRAMUSCULAR | Status: AC
  Filled 2022-02-14: qty 2

## 2022-02-14 MED ORDER — BUPIVACAINE LIPOSOME 1.3 % IJ SUSP
20.0000 mL | Freq: Once | INTRAMUSCULAR | Status: DC
  Filled 2022-02-14: qty 20

## 2022-02-14 MED ORDER — DEXAMETHASONE SODIUM PHOSPHATE 10 MG/ML IJ SOLN
10.0000 mg | Freq: Once | INTRAMUSCULAR | Status: AC
  Administered 2022-02-14: 10 mg via INTRAVENOUS
  Filled 2022-02-14: qty 1

## 2022-02-14 MED ORDER — ACETAMINOPHEN 160 MG/5ML PO SOLN: 1000.0000 mg | Freq: Once | ORAL | Status: DC | PRN

## 2022-02-14 MED ORDER — LISINOPRIL 20 MG PO TABS
40.0000 mg | ORAL_TABLET | Freq: Every day | ORAL | Status: DC
  Administered 2022-02-14 – 2022-02-15 (×2): 40 mg via ORAL
  Filled 2022-02-14 (×3): qty 2

## 2022-02-14 MED ORDER — PROPOFOL 500 MG/50ML IV EMUL
INTRAVENOUS | Status: DC | PRN
  Administered 2022-02-14: 75 ug/kg/min via INTRAVENOUS

## 2022-02-14 MED ORDER — POVIDONE-IODINE 10 % EX SWAB
2.0000 "application " | Freq: Once | CUTANEOUS | Status: AC
  Administered 2022-02-14: 2 via TOPICAL

## 2022-02-14 MED ORDER — POLYETHYLENE GLYCOL 3350 17 G PO PACK: 17.0000 g | PACK | Freq: Every day | ORAL | Status: DC | PRN

## 2022-02-14 MED ORDER — MIDAZOLAM HCL 2 MG/2ML IJ SOLN
INTRAMUSCULAR | Status: DC | PRN
Start: 2022-02-14 — End: 2022-02-14
  Administered 2022-02-14 (×2): 1 mg via INTRAVENOUS

## 2022-02-14 MED ORDER — 0.9 % SODIUM CHLORIDE (POUR BTL) OPTIME
TOPICAL | Status: DC | PRN
  Administered 2022-02-14: 1000 mL

## 2022-02-14 MED ORDER — BUPIVACAINE LIPOSOME 1.3 % IJ SUSP
INTRAMUSCULAR | Status: AC
  Filled 2022-02-14: qty 10

## 2022-02-14 MED ORDER — ALUM & MAG HYDROXIDE-SIMETH 200-200-20 MG/5ML PO SUSP: 30.0000 mL | ORAL | Status: DC | PRN

## 2022-02-14 MED ORDER — BUPIVACAINE-EPINEPHRINE 0.5% -1:200000 IJ SOLN
INTRAMUSCULAR | Status: DC | PRN
Start: 2022-02-14 — End: 2022-02-14
  Administered 2022-02-14: 30 mL

## 2022-02-14 MED ORDER — ACETAMINOPHEN 10 MG/ML IV SOLN
INTRAVENOUS | Status: AC
  Filled 2022-02-14: qty 100

## 2022-02-14 MED ORDER — BUPIVACAINE IN DEXTROSE 0.75-8.25 % IT SOLN
INTRATHECAL | Status: DC | PRN
  Administered 2022-02-14: 1.8 mL via INTRATHECAL

## 2022-02-14 MED ORDER — FENTANYL CITRATE PF 50 MCG/ML IJ SOSY
25.0000 ug | PREFILLED_SYRINGE | INTRAMUSCULAR | Status: DC | PRN
  Administered 2022-02-14: 50 ug via INTRAVENOUS

## 2022-02-14 MED ORDER — SODIUM CHLORIDE 0.9 % IR SOLN
Status: DC | PRN
  Administered 2022-02-14: 1000 mL

## 2022-02-14 MED ORDER — FENTANYL CITRATE PF 50 MCG/ML IJ SOSY
PREFILLED_SYRINGE | INTRAMUSCULAR | Status: AC
  Filled 2022-02-14: qty 1

## 2022-02-14 MED ORDER — SODIUM CHLORIDE 0.9 % IV SOLN: INTRAVENOUS | Status: DC

## 2022-02-14 MED ORDER — MIDAZOLAM HCL 2 MG/2ML IJ SOLN
INTRAMUSCULAR | Status: AC
  Filled 2022-02-14: qty 2

## 2022-02-14 SURGICAL SUPPLY — 50 items
ATTUNE MED DOME PAT 38 KNEE (Knees) ×1 IMPLANT
ATTUNE PS FEM LT SZ 6 CEM KNEE (Femur) ×1 IMPLANT
ATTUNE PSRP INSR SZ6 8 KNEE (Insert) ×1 IMPLANT
BAG COUNTER SPONGE SURGICOUNT (BAG) IMPLANT
BAG ZIPLOCK 12X15 (MISCELLANEOUS) ×2 IMPLANT
BASE TIBIAL ROT PLAT SZ 8 KNEE (Knees) IMPLANT
BENZOIN TINCTURE PRP APPL 2/3 (GAUZE/BANDAGES/DRESSINGS) ×2 IMPLANT
BLADE SAGITTAL 25.0X1.19X90 (BLADE) ×2 IMPLANT
BLADE SAW SGTL 13.0X1.19X90.0M (BLADE) ×2 IMPLANT
BLADE SURG SZ10 CARB STEEL (BLADE) ×4 IMPLANT
BNDG ELASTIC 6X10 VLCR STRL LF (GAUZE/BANDAGES/DRESSINGS) ×1 IMPLANT
BNDG ELASTIC 6X5.8 VLCR STR LF (GAUZE/BANDAGES/DRESSINGS) ×2 IMPLANT
BOOTIES KNEE HIGH SLOAN (MISCELLANEOUS) ×2 IMPLANT
BOWL SMART MIX CTS (DISPOSABLE) ×2 IMPLANT
CEMENT HV SMART SET (Cement) ×4 IMPLANT
CLSR STERI-STRIP ANTIMIC 1/2X4 (GAUZE/BANDAGES/DRESSINGS) ×1 IMPLANT
COVER SURGICAL LIGHT HANDLE (MISCELLANEOUS) ×2 IMPLANT
CUFF TOURN SGL QUICK 34 (TOURNIQUET CUFF) ×1
CUFF TRNQT CYL 34X4.125X (TOURNIQUET CUFF) ×1 IMPLANT
DRAPE INCISE IOBAN 66X45 STRL (DRAPES) ×2 IMPLANT
DRAPE U-SHAPE 47X51 STRL (DRAPES) ×2 IMPLANT
DRSG AQUACEL AG ADV 3.5X10 (GAUZE/BANDAGES/DRESSINGS) ×2 IMPLANT
DURAPREP 26ML APPLICATOR (WOUND CARE) ×2 IMPLANT
ELECT REM PT RETURN 15FT ADLT (MISCELLANEOUS) ×2 IMPLANT
GLOVE SRG 8 PF TXTR STRL LF DI (GLOVE) ×2 IMPLANT
GLOVE SURG NEOP MICRO LF SZ7.5 (GLOVE) ×4 IMPLANT
GLOVE SURG UNDER POLY LF SZ8 (GLOVE) ×2
GOWN STRL REUS W/TWL XL LVL3 (GOWN DISPOSABLE) ×4 IMPLANT
HANDPIECE INTERPULSE COAX TIP (DISPOSABLE) ×1
HOLDER FOLEY CATH W/STRAP (MISCELLANEOUS) IMPLANT
HOOD PEEL AWAY FLYTE STAYCOOL (MISCELLANEOUS) ×6 IMPLANT
KIT TURNOVER KIT A (KITS) IMPLANT
MANIFOLD NEPTUNE II (INSTRUMENTS) ×2 IMPLANT
NEEDLE HYPO 22GX1.5 SAFETY (NEEDLE) ×4 IMPLANT
NS IRRIG 1000ML POUR BTL (IV SOLUTION) ×2 IMPLANT
PACK TOTAL KNEE CUSTOM (KITS) ×2 IMPLANT
PADDING CAST COTTON 6X4 STRL (CAST SUPPLIES) ×2 IMPLANT
PROTECTOR NERVE ULNAR (MISCELLANEOUS) ×2 IMPLANT
SET HNDPC FAN SPRY TIP SCT (DISPOSABLE) ×1 IMPLANT
SPIKE FLUID TRANSFER (MISCELLANEOUS) ×4 IMPLANT
SPONGE T-LAP 18X18 ~~LOC~~+RFID (SPONGE) ×6 IMPLANT
STRIP CLOSURE SKIN 1/2X4 (GAUZE/BANDAGES/DRESSINGS) IMPLANT
SUT MNCRL AB 3-0 PS2 18 (SUTURE) ×2 IMPLANT
SUT VIC AB 0 CT1 36 (SUTURE) ×2 IMPLANT
SUT VIC AB 1 CT1 36 (SUTURE) ×4 IMPLANT
SYR CONTROL 10ML LL (SYRINGE) ×4 IMPLANT
TIBIAL BASE ROT PLAT SZ 8 KNEE (Knees) ×2 IMPLANT
TRAY FOLEY MTR SLVR 16FR STAT (SET/KITS/TRAYS/PACK) ×2 IMPLANT
WATER STERILE IRR 1000ML POUR (IV SOLUTION) ×4 IMPLANT
WRAP KNEE MAXI GEL POST OP (GAUZE/BANDAGES/DRESSINGS) ×2 IMPLANT

## 2022-02-14 NOTE — Evaluation (Signed)
Physical Therapy Evaluation Patient Details Name: Willie Krueger MRN: [id number] DOB: 1956-03-11 Today's Date: 02/14/2022  History of Present Illness  Pt s/p L TKR and with hx of neck surgery, DM with peripheral neuropathy, chronic pain syndrome, and osteoarthritis multiple joints  Clinical Impression  Pt s/p L TKR and presents with decreased L LE strength/ROM, post op pain and min WB tolerance on L LE limiting functional mobility.  Pt should progress to dc home with family assist and would greatly benefit from follow up HHPT to maximize IND and safety.     Recommendations for follow up therapy are one component of a multi-disciplinary discharge planning process, led by the attending physician.  Recommendations may be updated based on patient status, additional functional criteria and insurance authorization.  Follow Up Recommendations Home health PT    Assistance Recommended at Discharge Frequent or constant Supervision/Assistance  Patient can return home with the following  A lot of help with walking and/or transfers;A little help with bathing/dressing/bathroom;Assistance with cooking/housework;Assist for transportation;Help with stairs or ramp for entrance    Equipment Recommendations Rolling walker (2 wheels)  Recommendations for Other Services       Functional Status Assessment Patient has had a recent decline in their functional status and demonstrates the ability to make significant improvements in function in a reasonable and predictable amount of time.     Precautions / Restrictions Precautions Precautions: Knee;Fall Restrictions Weight Bearing Restrictions: No Other Position/Activity Restrictions: WBAT      Mobility  Bed Mobility Overal bed mobility: Needs Assistance Bed Mobility: Supine to Sit, Sit to Supine     Supine to sit: Min assist Sit to supine: Min assist   General bed mobility comments: Increased time with min assist for L LE    Transfers Overall  transfer level: Needs assistance Equipment used: Rolling walker (2 wheels) Transfers: Sit to/from Stand Sit to Stand: Mod assist           General transfer comment: cues for LE management and use of UEs to self assist    Ambulation/Gait Ambulation/Gait assistance: Min assist, Mod assist Gait Distance (Feet): 3 Feet Assistive device: Rolling walker (2 wheels) Gait Pattern/deviations: Step-to pattern, Shuffle, Decreased step length - right, Decreased step length - left, Trunk flexed Gait velocity: decr     General Gait Details: pt tolerating min WB on L LE; pt side-stepped up side of bed only  Stairs            Wheelchair Mobility    Modified Rankin (Stroke Patients Only)       Balance Overall balance assessment: Needs assistance Sitting-balance support: No upper extremity supported, Feet supported Sitting balance-Leahy Scale: Fair     Standing balance support: Bilateral upper extremity supported Standing balance-Leahy Scale: Poor                               Pertinent Vitals/Pain Pain Assessment Pain Assessment: 0-10 Pain Score: 8  Pain Location: L knee Pain Descriptors / Indicators: Aching, Sore Pain Intervention(s): Limited activity within patient's tolerance, Monitored during session, Premedicated before session, Ice applied    Home Living Family/patient expects to be discharged to:: Private residence Living Arrangements: Spouse/significant other Available Help at Discharge: Family Type of Home: House Home Access: Stairs to enter Entrance Stairs-Rails: Psychiatric nurse of Steps: 6   Home Layout: One level Home Equipment: Cane - single point;Rollator (4 wheels)      Prior Function  Prior Level of Function : Independent/Modified Independent             Mobility Comments: Cane as needed       Hand Dominance        Extremity/Trunk Assessment   Upper Extremity Assessment Upper Extremity Assessment: Overall  WFL for tasks assessed    Lower Extremity Assessment Lower Extremity Assessment: LLE deficits/detail    Cervical / Trunk Assessment Cervical / Trunk Assessment: Kyphotic  Communication   Communication: No difficulties  Cognition Arousal/Alertness: Awake/alert Behavior During Therapy: WFL for tasks assessed/performed Overall Cognitive Status: Within Functional Limits for tasks assessed                                          General Comments      Exercises Total Joint Exercises Ankle Circles/Pumps: AROM, Both, 15 reps, Supine   Assessment/Plan    PT Assessment Patient needs continued PT services  PT Problem List Decreased strength;Decreased range of motion;Decreased activity tolerance;Decreased balance;Decreased mobility;Decreased knowledge of use of DME;Pain       PT Treatment Interventions DME instruction;Gait training;Stair training;Functional mobility training;Therapeutic activities;Therapeutic exercise;Patient/family education    PT Goals (Current goals can be found in the Care Plan section)  Acute Rehab PT Goals Patient Stated Goal: Regain IND PT Goal Formulation: With patient Time For Goal Achievement: 02/21/22 Potential to Achieve Goals: Good    Frequency 7X/week     Co-evaluation               AM-PAC PT "6 Clicks" Mobility  Outcome Measure Help needed turning from your back to your side while in a flat bed without using bedrails?: A Little Help needed moving from lying on your back to sitting on the side of a flat bed without using bedrails?: A Little Help needed moving to and from a bed to a chair (including a wheelchair)?: A Lot Help needed standing up from a chair using your arms (e.g., wheelchair or bedside chair)?: A Lot Help needed to walk in hospital room?: Total Help needed climbing 3-5 steps with a railing? : Total 6 Click Score: 12    End of Session Equipment Utilized During Treatment: Gait belt Activity Tolerance:  Patient limited by fatigue;Other (comment) (min WB tolerated on L LE) Patient left: in bed;with call bell/phone within reach;with bed alarm set;with family/visitor present Nurse Communication: Mobility status PT Visit Diagnosis: Difficulty in walking, not elsewhere classified (R26.2);Pain;Unsteadiness on feet (R26.81) Pain - Right/Left: Left Pain - part of body: Knee    Time: 1745-1820 PT Time Calculation (min) (ACUTE ONLY): 35 min   Charges:   PT Evaluation $PT Eval Low Complexity: 1 Low PT Treatments $Therapeutic Activity: 8-22 mins        Debe Coder PT Acute Rehabilitation Services Pager [phone] Office [phone]   Willie Krueger 02/14/2022, 7:11 PM

## 2022-02-14 NOTE — H&P (Signed)
TOTAL KNEE ADMISSION H&P  Patient is being admitted for left total knee arthroplasty.  Subjective:  Chief Complaint:left knee pain.  HPI: Willie Krueger, 66 y.o. male, has a history of pain and functional disability in the left knee due to arthritis and has failed non-surgical conservative treatments for greater than 12 weeks to includeNSAID's and/or analgesics, corticosteriod injections, viscosupplementation injections, use of assistive devices, and activity modification.  Onset of symptoms was gradual, starting 4 years ago with gradually worsening course since that time. The patient noted no past surgery on the left knee(s).  Patient currently rates pain in the left knee(s) at 10 out of 10 with activity. Patient has night pain, worsening of pain with activity and weight bearing, pain that interferes with activities of daily living, pain with passive range of motion, and joint swelling.  Patient has evidence of subchondral cysts, subchondral sclerosis, joint subluxation, and joint space narrowing by imaging studies. This patient has had  failure of all reasonable conservative care . There is no active infection.  Patient Active Problem List   Diagnosis Date Noted   Overweight 08/13/2020   Osteoarthritis of knees, bilateral 04/13/2020   Chronic venous stasis 03/12/2018   Degeneration of lumbosacral intervertebral disc 06/01/2017   History of spinal surgery 06/01/2017   Synovial cyst of lumbar spine 06/01/2017   Acute neck pain 06/01/2017   Osteoarthritis of shoulders (B) (R>L) 06/01/2017   Cervical spondylosis 06/01/2017   Osteoarthritis 03/16/2017   Enlarged thyroid 02/02/2017   Chronic pain syndrome 12/04/2016   Allergic drug rash due to anti-infective agent 11/28/2016   Allergy to drug 11/21/2016   Diabetic foot ulcer with osteomyelitis ([place]) 11/14/2016   Diabetic infection of left foot ([place]) 10/13/2016   Lumbar spondylosis 06/03/2016   Lumbar facet syndrome (Location of Primary  Source of Pain) (Bilateral) (R>L) 05/28/2016   Abnormal MRI, lumbar spine (05/19/2016) 05/28/2016   Neurogenic pain 02/27/2016   Diabetic peripheral neuropathy ([place]) 02/27/2016   Chronic shoulder pain (Location of Tertiary source of pain) (Bilateral) (R>L) 02/27/2016   Type 2 diabetes mellitus with foot ulcer (CODE) ([place]) 01/23/2016   Diabetic osteomyelitis ([place]) 12/05/2015   Neuropathy 12/05/2015   Obesity 12/05/2015   Encounter for chronic pain management 12/03/2015   Elevated C-reactive protein (CRP) 12/03/2015   Long term current use of opiate analgesic 10/31/2015   Long term prescription opiate use 10/31/2015   Opiate use (67.5 MME/Day) 10/31/2015   Opiate dependence ([place]) 10/31/2015   Encounter for therapeutic drug level monitoring 10/31/2015   Chronic neck pain (Right side) 10/31/2015   Cervical post-laminectomy syndrome 10/31/2015   Chronic low back pain (Location of Primary Source of Pain) (Midline pain) (R>L) 10/31/2015   Chronic cervical radicular pain (Bilateral) (R>L) 10/31/2015   Chronic lumbar radicular pain (Bilateral) (R>L) 10/31/2015   Substance use disorder Risk: LOW 10/31/2015   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   Chronic lower extremity pain (Location of Secondary source of pain) (Bilateral) (R>L) 10/31/2015   History of carpal tunnel release of both wrists 10/31/2015   Grade I anterolisthesis of C7 on T1 10/31/2015   Toe ulcer ([place]) 06/24/2015   Cervical spinal cord compression ([place]) 02/07/2015   MI (mitral incompetence) 11/11/2014   Cardiac murmur 11/09/2014   History of decompression of median nerve 05/19/2014   HLD (hyperlipidemia) 10/20/2012   Benign hypertension 10/20/2012   ED (erectile dysfunction) of organic origin 10/20/2012   Chronic hepatitis C virus infection ([place]) 10/02/2011   Adiposity 10/02/2011   Current smoker 10/02/2011  Past Medical History:  Diagnosis Date   Acute postoperative pain 02/09/2017   Chronic pain    Chronic pain  associated with significant psychosocial dysfunction 05/04/2015   Last Assessment & Plan:  He is having worsening problems with breakthrough pain. Gabapentin is increased from 800 mg 3 times daily to 1200 mg 3 times daily. Oxycodone is also renewed. He continues to through evaluation at [facility] pain clinic.    Diabetes mellitus without complication (HCC)    Heart murmur    History of spinal stenosis 10/31/2015   C3-4 with severe central canal stenosis with small amount of myelomalacia present. Moderate to severe canal stenosis is also present at C4-5 and C5-6. Moderate to severe neuroforaminal narrowing between the levels of C2-C7   Hyperlipidemia    Hypertension    Leg weakness 07/07/2014   Mitral valve regurgitation    Nerve root inflammation 06/26/2014   Last Assessment & Plan:  Status is unchanged with ongoing pain and limited mobility. Continue with physical therapy and pain management.    Osteoarthritis     Past Surgical History:  Procedure Laterality Date   AMPUTATION TOE Left 10/2016   partial   CARPAL TUNNEL RELEASE     FOOT SURGERY Left 09/2016   IRRIGATION AND DEBRIDEMENT FOOT Left 10/13/2016   Procedure: IRRIGATION AND DEBRIDEMENT FOOT;  Surgeon: Samara Deist, DPM;  Location: [facility] ORS;  Service: Podiatry;  Laterality: Left;   PICC LINE PLACE PERIPHERAL ([place] HX) Left [phone]   SPINE SURGERY      Current Facility-Administered Medications  Medication Dose Route Frequency Provider Last Rate Last Admin   bupivacaine liposome (EXPAREL) 1.3 % injection 266 mg  20 mL Other Once Dorna Leitz, MD       Current Outpatient Medications  Medication Sig Dispense Refill Last Dose   acetaminophen (TYLENOL) 650 MG CR tablet Take 1,300 mg by mouth every 4 (four) hours as needed for pain.      aspirin EC 81 MG tablet Take 81 mg by mouth daily.      atorvastatin (LIPITOR) 40 MG tablet Take 40 mg by mouth daily.      Cholecalciferol (VITAMIN D3) 2000 units capsule Take 2,000 Units by mouth  daily.       furosemide (LASIX) 20 MG tablet Take 40 mg by mouth 2 (two) times daily.      gabapentin (NEURONTIN) 800 MG tablet Take 800 mg by mouth every 6 (six) hours.      glimepiride (AMARYL) 2 MG tablet Take 2 mg by mouth daily with breakfast.       guaiFENesin (MUCINEX) 600 MG 12 hr tablet Take 600 mg by mouth 2 (two) times daily as needed for cough.      lisinopril (PRINIVIL,ZESTRIL) 40 MG tablet Take 40 mg by mouth daily.      metaxalone (SKELAXIN) 800 MG tablet Take 800 mg by mouth every 8 (eight) hours as needed for muscle spasms.      metFORMIN (GLUCOPHAGE-XR) 500 MG 24 hr tablet Take 500 mg by mouth 2 (two) times daily.      gabapentin (NEURONTIN) 600 MG tablet Take 2 tablets (1,200 mg total) by mouth every 6 (six) hours. (Patient not taking: Reported on 01/29/2022) 240 tablet 5 Not Taking   traMADol (ULTRAM) 50 MG tablet Take by mouth.      Allergies  Allergen Reactions   Piperacillin-Tazobactam In Dex Rash   Cleocin [Clindamycin] Rash   Clindamycin Phosphate Rash   Sulfa Antibiotics Rash   Zosyn [  Piperacillin Sod-Tazobactam So] Rash    Social History   Tobacco Use   Smoking status: Every Day    Packs/day: 1.50    Types: Cigarettes   Smokeless tobacco: Never  Substance Use Topics   Alcohol use: No    Alcohol/week: 0.0 standard drinks    Family History  Problem Relation Age of Onset   Cancer Mother    Diabetes Mother    Heart disease Father      Review of Systems ROS: I have reviewed the patient's review of systems thoroughly and there are no positive responses as relates to the HPI.  Objective:  Physical Exam  Vital signs in last 24 hours:   Well-developed well-nourished patient in no acute distress. Alert and oriented x3 HEENT:within normal limits Cardiac: Regular rate and rhythm Pulmonary: Lungs clear to auscultation Abdomen: Soft and nontender.  Normal active bowel sounds  Musculoskeletal: (Left knee: Obvious valgus malalignment.  Painful range of  motion.  No instability.  Trace effusion.  Lower extremity has mild edema and erythema. Labs: Recent Results (from the past 2160 hour(s))  Glucose, capillary     Status: None   Collection Time: 02/04/22  9:06 AM  Result Value Ref Range   Glucose-Capillary 98 70 - 99 mg/dL    Comment: Glucose reference range applies only to samples taken after fasting for at least 8 hours.  CBC per protocol     Status: None   Collection Time: 02/04/22  9:34 AM  Result Value Ref Range   WBC 7.5 4.0 - 10.5 K/uL   RBC 4.32 4.22 - 5.81 MIL/uL   Hemoglobin 13.7 13.0 - 17.0 g/dL   HCT 42.8 39.0 - 52.0 %   MCV 99.1 80.0 - 100.0 fL   MCH 31.7 26.0 - 34.0 pg   MCHC 32.0 30.0 - 36.0 g/dL   RDW 14.6 11.5 - 15.5 %   Platelets 174 150 - 400 K/uL   nRBC 0.0 0.0 - 0.2 %    Comment: Performed at [facility], [place] [address], [place], [place] [address]  Basic metabolic panel per protocol     Status: Abnormal   Collection Time: 02/04/22  9:34 AM  Result Value Ref Range   Sodium 134 (L) 135 - 145 mmol/L   Potassium 4.5 3.5 - 5.1 mmol/L   Chloride 105 98 - 111 mmol/L   CO2 22 22 - 32 mmol/L   Glucose, Bld 94 70 - 99 mg/dL    Comment: Glucose reference range applies only to samples taken after fasting for at least 8 hours.   BUN 30 (H) 8 - 23 mg/dL   Creatinine, Ser 1.15 0.61 - 1.24 mg/dL   Calcium 9.3 8.9 - 10.3 mg/dL   GFR, Estimated >60 >60 mL/min    Comment: (NOTE) Calculated using the CKD-EPI Creatinine Equation (2021)    Anion gap 7 5 - 15    Comment: Performed at [facility], [place] [address] [place], [place] [address]  Hemoglobin A1c per protocol     Status: Abnormal   Collection Time: 02/04/22  9:34 AM  Result Value Ref Range   Hgb A1c MFr Bld 6.1 (H) 4.8 - 5.6 %    Comment: (NOTE) Pre diabetes:          5.7%-6.4%  Diabetes:              >6.4%  Glycemic control for   <7.0% adults with diabetes    Mean Plasma Glucose 128.37 mg/dL  Comment: Performed at  [facility], [place] [address], [place], [place] [address]  Surgical PCR Screen     Status: None   Collection Time: 02/04/22  9:34 AM   Specimen: Nasal Mucosa; Nasal Swab  Result Value Ref Range   MRSA, PCR NEGATIVE NEGATIVE   Staphylococcus aureus NEGATIVE NEGATIVE    Comment: (NOTE) The Xpert SA Assay (FDA approved for NASAL specimens in patients 59 years of age and older), is one component of a comprehensive surveillance program. It is not intended to diagnose infection nor to guide or monitor treatment. Performed at [facility], [place] [address] [place], [place] [address]   SARS CORONAVIRUS 2 (TAT 6-24 HRS) Nasopharyngeal Nasopharyngeal Swab     Status: None   Collection Time: 02/12/22  7:39 AM   Specimen: Nasopharyngeal Swab  Result Value Ref Range   SARS Coronavirus 2 NEGATIVE NEGATIVE    Comment: (NOTE) SARS-CoV-2 target nucleic acids are NOT DETECTED.  The SARS-CoV-2 RNA is generally detectable in upper and lower respiratory specimens during the acute phase of infection. Negative results do not preclude SARS-CoV-2 infection, do not rule out co-infections with other pathogens, and should not be used as the sole basis for treatment or other patient management decisions. Negative results must be combined with clinical observations, patient history, and epidemiological information. The expected result is Negative.  Fact Sheet for Patients: SugarRoll.be  Fact Sheet for Healthcare Providers: https://www.woods-mathews.com/  This test is not yet approved or cleared by the [place] FDA and  has been authorized for detection and/or diagnosis of SARS-CoV-2 by FDA under an Emergency Use Authorization (EUA). This EUA will remain  in effect (meaning this test can be used) for the duration of the COVID-19 declaration under Se ction 564(b)(1) of the Act, 21 U.S.C. section 360bbb-3(b)(1), unless the authorization is  terminated or revoked sooner.  Performed at [facility], [place] [address] [place], [place] [address]      Estimated body mass index is 25.68 kg/m as calculated from the following:   Height as of 02/04/22: 6\' 2"  (1.88 m).   Weight as of 02/04/22: 90.7 kg.   Imaging Review Plain radiographs demonstrate severe degenerative joint disease of the left knee(s). The overall alignment issignificant valgus. The bone quality appears to be fair for age and reported activity level.      Assessment/Plan:  End stage arthritis, left knee   The patient history, physical examination, clinical judgment of the provider and imaging studies are consistent with end stage degenerative joint disease of the left knee(s) and total knee arthroplasty is deemed medically necessary. The treatment options including medical management, injection therapy arthroscopy and arthroplasty were discussed at length. The risks and benefits of total knee arthroplasty were presented and reviewed. The risks due to aseptic loosening, infection, stiffness, patella tracking problems, thromboembolic complications and other imponderables were discussed. The patient acknowledged the explanation, agreed to proceed with the plan and consent was signed. Patient is being admitted for inpatient treatment for surgery, pain control, PT, OT, prophylactic antibiotics, VTE prophylaxis, progressive ambulation and ADL's and discharge planning. The patient is planning to be discharged home with home health services    Anticipated LOS equal to or greater than 2 midnights due to - Age 71 and older with one or more of the following:  - Obesity  - Expected need for hospital services (PT, OT, Nursing) required for safe  discharge  - Anticipated need for postoperative skilled nursing care or  inpatient rehab  - Active co-morbidities: Diabetes, Coronary Artery Disease, and poor home situation OR   -   - Patient is a high risk of re-admission due  to: Barriers to post-acute care (logistical, no family support in home)

## 2022-02-14 NOTE — Progress Notes (Signed)
Orthopedic Tech Progress Note Patient Details:  Willie Krueger Aug 22, 1956 [id number] Patient would not let me apply a CPM to his left knee at this time.  Patient ID: Willie Krueger, male   DOB: 1956/10/06, 65 y.o.   MRN: [id number]  Jearld Lesch 02/14/2022, 4:57 PM

## 2022-02-14 NOTE — Anesthesia Procedure Notes (Signed)
Procedure Name: MAC Date/Time: 02/14/2022 10:54 AM Performed by: Lollie Sails, CRNA Pre-anesthesia Checklist: Patient identified, Emergency Drugs available, Suction available, Patient being monitored and Timeout performed Oxygen Delivery Method: Simple face mask Placement Confirmation: positive ETCO2

## 2022-02-14 NOTE — Anesthesia Postprocedure Evaluation (Signed)
Anesthesia Post Note  Patient: Willie Krueger  Procedure(s) Performed: TOTAL KNEE ARTHROPLASTY (Left: Knee)     Patient location during evaluation: PACU Anesthesia Type: Regional, MAC and Spinal Level of consciousness: awake and alert and oriented Pain management: pain level controlled Vital Signs Assessment: post-procedure vital signs reviewed and stable Respiratory status: spontaneous breathing, nonlabored ventilation and respiratory function stable Cardiovascular status: blood pressure returned to baseline and stable Postop Assessment: no headache, no backache, spinal receding and patient able to bend at knees Anesthetic complications: no   No notable events documented.  Last Vitals:  Vitals:   02/14/22 1430 02/14/22 1445  BP: 134/70   Pulse: 84 79  Resp: 17 18  Temp:    SpO2: 100% 100%    Last Pain:  Vitals:   02/14/22 1430  TempSrc:   PainSc: 0-No pain                 Pervis Hocking

## 2022-02-14 NOTE — Progress Notes (Signed)
Orthopedic Tech Progress Note Patient Details:  Willie Krueger January 17, 1956 [id number]  CPM Left Knee CPM Left Knee: On Left Knee Flexion (Degrees): 70 Left Knee Extension (Degrees): 0  Post Interventions Patient Tolerated: Well  [name] [name] 02/14/2022, 6:42 PM

## 2022-02-14 NOTE — Anesthesia Preprocedure Evaluation (Addendum)
Anesthesia Evaluation  Patient identified by MRN, date of birth, ID band Patient awake    Reviewed: Allergy & Precautions, NPO status , Patient's Chart, lab work & pertinent test results  Airway Mallampati: III  TM Distance: >3 FB Neck ROM: Full    Dental  (+) Edentulous Upper, Edentulous Lower   Pulmonary Current Smoker and Patient abstained from smoking.,  Quit one week ago, former 1ppd x many years    Pulmonary exam normal breath sounds clear to auscultation       Cardiovascular hypertension, Pt. on medications Normal cardiovascular exam+ Valvular Problems/Murmurs MR  Rhythm:Regular Rate:Normal     Neuro/Psych negative psych ROS   GI/Hepatic negative GI ROS, (+) Hepatitis -  Endo/Other  diabetes, Well Controlled, Type 2, Oral Hypoglycemic Agents  Renal/GU negative Renal ROS  negative genitourinary   Musculoskeletal  (+) Arthritis , Osteoarthritis,    Abdominal   Peds negative pediatric ROS (+)  Hematology negative hematology ROS (+) hct 42.8, plt 174   Anesthesia Other Findings   Reproductive/Obstetrics negative OB ROS                            Anesthesia Physical Anesthesia Plan  ASA: 3  Anesthesia Plan: Spinal, MAC and Regional   Post-op Pain Management: Ofirmev IV (intra-op)*   Induction:   PONV Risk Score and Plan: 2 and Propofol infusion and TIVA  Airway Management Planned: Natural Airway and Nasal Cannula  Additional Equipment: None  Intra-op Plan:   Post-operative Plan:   Informed Consent: I have reviewed the patients History and Physical, chart, labs and discussed the procedure including the risks, benefits and alternatives for the proposed anesthesia with the patient or authorized representative who has indicated his/her understanding and acceptance.       Plan Discussed with: CRNA  Anesthesia Plan Comments:         Anesthesia Quick Evaluation

## 2022-02-14 NOTE — Op Note (Signed)
PATIENT ID:      Willie SANSOUCIE  MRN:     [id number] DOB/AGE:    08/10/1956 / 66 y.o.       OPERATIVE REPORT   DATE OF PROCEDURE:  02/14/2022      PREOPERATIVE DIAGNOSIS:   OA      Estimated body mass index is 25.67 kg/m as calculated from the following:   Height as of this encounter: 6\' 2"  (1.88 m).   Weight as of this encounter: 90.7 kg.                                                       POSTOPERATIVE DIAGNOSIS:   Same                                                                  PROCEDURE:  Procedure(s): TOTAL KNEE ARTHROPLASTY Using DepuyAttune RP implants #6 Femur, #8Tibia, 8 mm Attune RP bearing, 38 Patella    SURGEON: Alta Corning  ASSISTANT:   Gaspar Skeeters PA-C   (Present and scrubbed throughout the case, critical for assistance with exposure, retraction, instrumentation, and closure.)        ANESTHESIA: spinal, 20cc Exparel, 50cc 0.25% Marcaine EBL: 200 cc FLUID REPLACEMENT: unk cc crystaloid TOURNIQUET: DRAINS: None TRANEXAMIC ACID: 1gm IV, 2gm topical COMPLICATIONS:  None         INDICATIONS FOR PROCEDURE: The patient has  OA, severe valgus deformities, XR shows bone on bone arthritis, lateral subluxation of tibia. Patient has failed all conservative measures including anti-inflammatory medicines, narcotics, attempts at exercise and weight loss, cortisone injections and viscosupplementation.  Risks and benefits of surgery have been discussed, questions answered.   DESCRIPTION OF PROCEDURE: The patient identified by armband, received  IV antibiotics, in the holding area at [facility]. Patient taken to the operating room, appropriate anesthetic monitors were attached, and spinal anesthesia was  induced. IV Tranexamic acid was given.Tourniquet applied high to the operative thigh. Lateral post and foot positioner applied to the table, the lower extremity was then prepped and draped in usual sterile fashion from the toes to the tourniquet. Time-out procedure was  performed.Gaspar Skeeters PAC, was present and scrubbed throughout the case, critical for assistance with, positioning, exposure, retraction, instrumentation, and closure.The skin and subcutaneous tissue along the incision was injected with 20 cc of a mixture of Exparel and Marcaine solution, using a 20-gauge by 1-1/2 inch needle. We began the operation, with the knee flexed 130 degrees, by making the anterior midline incision starting at handbreadth above the patella going over the patella 1 cm medial to and 4 cm distal to the tibial tubercle. Small bleeders in the skin and the subcutaneous tissue identified and cauterized. Transverse retinaculum was incised and reflected medially and a medial parapatellar arthrotomy was accomplished. the patella was everted and theprepatellar fat pad resected. The superficial medial collateral ligament was then elevated from anterior to posterior along the proximal flare of the tibia and anterior half of the menisci resected. The knee was hyperflexed exposing bone on bone arthritis. Peripheral and notch osteophytes as well as the  cruciate ligaments were then resected. We continued to work our way around posteriorly along the proximal tibia, and externally rotated the tibia subluxing it out from underneath the femur. A McHale PCL retractor was placed through the notch and a lateral Hohmann retractor placed, and we then entered the proximal tibia in line with the Depuy starter drill in line with the axis of the tibia followed by an intramedullary guide rod and 0-degree posterior slope cutting guide. The tibial cutting guide, 4 degree posterior sloped, was pinned into place allowing resection of 14 mm of bone medially and 1 mm of bone laterally. Satisfied with the tibial resection, we then entered the distal femur 2 mm anterior to the PCL origin with the intramedullary guide rod and applied the distal femoral cutting guide set at 9 mm, with 6 degrees of valgus. This was pinned along the  epicondylar axis. At this point, the distal femoral cut was accomplished without difficulty. We then sized for a #6 femoral component and pinned the guide in 0 degrees of external rotation. The chamfer cutting guide was pinned into place. The anterior, posterior, and chamfer cuts were accomplished without difficulty followed by the Attune RP box cutting guide and the box cut. We also removed posterior osteophytes from the posterior femoral condyles.  At this point we tried to put in the distal lollipop and really could not get the lollipop and distally.  We did releasing of the entire iliotibial band distally.  I did fractional lengthening of the IT band above this.  I did releases of the posterior capsule carefully not going back in the area where the peroneal nerve might be.  Flexion gap was fine for a 6 lollipop and so we knew we had to do additional distal femoral cutting.  I put the guide back on and we took 4 additional millimeters of distal bone and still cannot get a lollipop in.  At this point I turned to a 9 degree valgus cut and took 2 additional millimeters of bone.  At this point I could get a 6 mm dog bone and and actually could force a 7N.  I did some additional releases.  We then aggressively went after the posterior capsule to try to get it released on that lateral side.  The posterior capsule was then injected with Exparel solution. The knee was brought into full extension. We checked our extension gap and fit a 8 mm bearing. Distracting in extension with a lamina spreader,  bleeders in the posterior capsule, Posterior medial and posterior lateral gutter were cauterized.  The transexamic acid-soaked sponge was then placed in the gap of the knee in extension. The knee was flexed 30. The posterior patella cut was accomplished with the 9.5 mm Attune cutting guide, sized for a 44mm dome, and the fixation pegs drilled.The knee was then once again hyperflexed exposing the proximal tibia. We sized for a  # 8 tibial base plate, applied the smokestack and the conical reamer followed by the the Delta fin keel punch. We then hammered into place the Attune RP trial femoral component, drilled the lugs, inserted a  8 mm trial bearing, trial patellar button, and took the knee through range of motion from 0-130 degrees. Medial and lateral ligamentous stability was checked. No thumb pressure was required for patellar Tracking. The tourniquet was released at 1 hour and 25 minutes.. All trial components were removed, mating surfaces irrigated with pulse lavage, and dried with suction and sponges. 10 cc of the Exparel  solution was applied to the cancellus bone of the patella distal femur and proximal tibia.  After waiting 30 seconds, the bony surfaces were again, dried with sponges. A double batch of DePuy HV cement was mixed and applied to all bony metallic mating surfaces except for the posterior condyles of the femur itself. In order, we hammered into place the tibial tray and removed excess cement, the femoral component and removed excess cement. The final Attune RP bearing was inserted, and the knee brought to full extension with compression. The patellar button was clamped into place, and excess cement removed. The knee was held at 30 flexion with compression, while the cement cured. The wound was irrigated out with normal saline solution pulse lavage. The rest of the Exparel was injected into the parapatellar arthrotomy, subcutaneous tissues, and periosteal tissues. The parapatellar arthrotomy was closed with running #1 Vicryl suture. The subcutaneous tissue with 3-0 undyed Vicryl suture, and the skin with running 3-0 SQ vicryl. An Aquacil and Ace wrap were applied. The patient was taken to recovery room without difficulty.   Alta Corning 02/14/2022, 1:05 PM

## 2022-02-14 NOTE — Plan of Care (Signed)
Problem: Education: Goal: Knowledge of the prescribed therapeutic regimen will improve Outcome: Progressing   Problem: Clinical Measurements: Goal: Postoperative complications will be avoided or minimized Outcome: Progressing   Problem: Pain Management: Goal: Pain level will decrease with appropriate interventions Outcome: [place], RN 02/14/22 8:03 PM

## 2022-02-14 NOTE — Transfer of Care (Signed)
Immediate Anesthesia Transfer of Care Note  Patient: Willie Krueger  Procedure(s) Performed: TOTAL KNEE ARTHROPLASTY (Left: Knee)  Patient Location: PACU  Anesthesia Type:Spinal and MAC combined with regional for post-op pain  Level of Consciousness: awake, drowsy and responds to stimulation  Airway & Oxygen Therapy: Patient Spontanous Breathing and Patient connected to face mask oxygen  Post-op Assessment: Report given to RN and Post -op Vital signs reviewed and stable  Post vital signs: Reviewed and stable  Last Vitals:  Vitals Value Taken Time  BP 118/72 02/14/22 1340  Temp    Pulse 79 02/14/22 1342  Resp 19 02/14/22 1342  SpO2 100 % 02/14/22 1342  Vitals shown include unvalidated device data.  Last Pain:  Vitals:   02/14/22 1023  TempSrc: Oral  PainSc:       Patients Stated Pain Goal: 6 (13/64/38 3779)  Complications: No notable events documented.

## 2022-02-14 NOTE — Anesthesia Procedure Notes (Signed)
Spinal  Patient location during procedure: OR Start time: 02/14/2022 10:55 AM End time: 02/14/2022 11:01 AM Reason for block: surgical anesthesia Staffing Performed: anesthesiologist  Anesthesiologist: Pervis Hocking, DO Preanesthetic Checklist Completed: patient identified, IV checked, risks and benefits discussed, surgical consent, monitors and equipment checked, pre-op evaluation and timeout performed Spinal Block Patient position: sitting Prep: DuraPrep and site prepped and draped Patient monitoring: cardiac monitor, continuous pulse ox and blood pressure Approach: midline Location: L3-4 Injection technique: single-shot Needle Needle type: Pencan  Needle gauge: 24 G Needle length: 9 cm Assessment Sensory level: T6 Events: CSF return Additional Notes Functioning IV was confirmed and monitors were applied. Sterile prep and drape, including hand hygiene and sterile gloves were used. The patient was positioned and the spine was prepped. The skin was anesthetized with lidocaine.  Free flow of clear CSF was obtained prior to injecting local anesthetic into the CSF.  The spinal needle aspirated freely following injection.  The needle was carefully withdrawn.  The patient tolerated the procedure well.

## 2022-02-14 NOTE — Anesthesia Procedure Notes (Signed)
Anesthesia Regional Block: Adductor canal block   Pre-Anesthetic Checklist: , timeout performed,  Correct Patient, Correct Site, Correct Laterality,  Correct Procedure, Correct Position, site marked,  Risks and benefits discussed,  Surgical consent,  Pre-op evaluation,  At surgeon's request and post-op pain management  Laterality: Left  Prep: Maximum Sterile Barrier Precautions used, chloraprep       Needles:  Injection technique: Single-shot  Needle Type: Echogenic Stimulator Needle     Needle Length: 9cm  Needle Gauge: 22     Additional Needles:   Procedures:,,,, ultrasound used (permanent image in chart),,    Narrative:  Start time: 02/14/2022 10:40 AM End time: 02/14/2022 10:45 AM Injection made incrementally with aspirations every 5 mL.  Performed by: Personally  Anesthesiologist: Pervis Hocking, DO  Additional Notes: Monitors applied. No increased pain on injection. No increased resistance to injection. Injection made in 5cc increments. Good needle visualization. Patient tolerated procedure well.

## 2022-02-15 ENCOUNTER — Other Ambulatory Visit (HOSPITAL_COMMUNITY): Payer: Self-pay

## 2022-02-15 LAB — CBC
HCT: 39.2 % (ref 39.0–52.0)
Hemoglobin: 12.8 g/dL — ABNORMAL LOW (ref 13.0–17.0)
MCH: 31.9 pg (ref 26.0–34.0)
MCHC: 32.7 g/dL (ref 30.0–36.0)
MCV: 97.8 fL (ref 80.0–100.0)
Platelets: 156 10*3/uL (ref 150–400)
RBC: 4.01 MIL/uL — ABNORMAL LOW (ref 4.22–5.81)
RDW: 14.2 % (ref 11.5–15.5)
WBC: 10.2 10*3/uL (ref 4.0–10.5)
nRBC: 0 % (ref 0.0–0.2)

## 2022-02-15 LAB — GLUCOSE, CAPILLARY
Glucose-Capillary: 166 mg/dL — ABNORMAL HIGH (ref 70–99)
Glucose-Capillary: 173 mg/dL — ABNORMAL HIGH (ref 70–99)

## 2022-02-15 NOTE — Progress Notes (Signed)
Pt went home after seen by PT. Instructed to call the MD's office for OTPT. Walker was delivered at bedside and sent home. AVS was given and discussed to both pt and wife, all questions were answered.

## 2022-02-15 NOTE — Discharge Instructions (Signed)

## 2022-02-15 NOTE — Discharge Summary (Signed)
Patient ID: Willie Krueger MRN: [id number] DOB/AGE: 68-Nov-1957 68 y.o.  Admit date: 02/14/2022 Discharge date: 02/15/2022  Admission Diagnoses:  Principal Problem:   Primary osteoarthritis of left knee   Discharge Diagnoses:  Same  Past Medical History:  Diagnosis Date   Acute postoperative pain 02/09/2017   Chronic pain    Chronic pain associated with significant psychosocial dysfunction 05/04/2015   Last Assessment & Plan:  He is having worsening problems with breakthrough pain. Gabapentin is increased from 800 mg 3 times daily to 1200 mg 3 times daily. Oxycodone is also renewed. He continues to through evaluation at [facility] pain clinic.    Diabetes mellitus without complication (HCC)    Heart murmur    History of spinal stenosis 10/31/2015   C3-4 with severe central canal stenosis with small amount of myelomalacia present. Moderate to severe canal stenosis is also present at C4-5 and C5-6. Moderate to severe neuroforaminal narrowing between the levels of C2-C7   Hyperlipidemia    Hypertension    Leg weakness 07/07/2014   Mitral valve regurgitation    Nerve root inflammation 06/26/2014   Last Assessment & Plan:  Status is unchanged with ongoing pain and limited mobility. Continue with physical therapy and pain management.    Osteoarthritis     Surgeries: Procedure(s): TOTAL KNEE ARTHROPLASTY on 02/14/2022   Consultants:   Discharged Condition: Improved  Hospital Course: Willie Krueger is an 66 y.o. male who was admitted 02/14/2022 for operative treatment ofPrimary osteoarthritis of left knee. Patient has severe unremitting pain that affects sleep, daily activities, and work/hobbies. After pre-op clearance the patient was taken to the operating room on 02/14/2022 and underwent  Procedure(s): TOTAL KNEE ARTHROPLASTY.    Patient was given perioperative antibiotics:  Anti-infectives (From admission, onward)    Start     Dose/Rate Route Frequency Ordered Stop   02/14/22 1700   ceFAZolin (ANCEF) IVPB 2g/100 mL premix        2 g 200 mL/hr over 30 Minutes Intravenous Every 6 hours 02/14/22 1617 02/15/22 0550   02/14/22 1015  vancomycin (VANCOCIN) IVPB 1000 mg/200 mL premix        1,000 mg 200 mL/hr over 60 Minutes Intravenous On call to O.R. 02/14/22 1010 02/14/22 1137   02/14/22 0000  cephALEXin (KEFLEX) 500 MG capsule        500 mg Oral 3 times daily 02/14/22 1041 02/24/22 2359        Patient was given sequential compression devices, early ambulation, and chemoprophylaxis to prevent DVT.  Patient benefited maximally from hospital stay and there were no complications.    Recent vital signs: Patient Vitals for the past 24 hrs:  BP Temp Temp src Pulse Resp SpO2 Height Weight  02/15/22 0807 110/62 98.2 F (36.8 C) Oral 62 -- 94 % -- --  02/15/22 0533 108/62 98 F (36.7 C) Oral 73 16 98 % -- --  02/15/22 0100 111/75 98.8 F (37.1 C) Oral 78 16 98 % -- --  02/14/22 2037 134/79 98 F (36.7 C) Oral 89 16 99 % -- --  02/14/22 1705 129/87 97.8 F (36.6 C) Oral 77 14 96 % -- --  02/14/22 1545 130/77 97.8 F (36.6 C) -- 75 17 99 % -- --  02/14/22 1530 138/79 -- -- 86 18 100 % -- --  02/14/22 1515 135/82 -- -- 79 17 98 % -- --  02/14/22 1500 128/78 -- -- 79 17 98 % -- --  02/14/22 1445 129/73 -- --  79 18 100 % -- --  02/14/22 1430 134/70 -- -- 84 17 100 % -- --  02/14/22 1415 137/73 -- -- 84 12 100 % -- --  02/14/22 1400 130/69 -- -- 81 19 100 % -- --  02/14/22 1345 120/71 -- -- 79 19 100 % -- --  02/14/22 1341 118/72 97.7 F (36.5 C) -- 79 19 100 % -- --  02/14/22 1023 (!) 150/71 98.5 F (36.9 C) Oral 80 15 95 % -- --  02/14/22 1018 -- -- -- -- -- -- 6\' 2"  (1.88 m) 90.7 kg     Recent laboratory studies:  Recent Labs    02/15/22 0355  WBC 10.2  HGB 12.8*  HCT 39.2  PLT 156     Discharge Medications:   Allergies as of 02/15/2022       Reactions   Piperacillin-tazobactam In Dex Rash   Cleocin [clindamycin] Rash   Clindamycin Phosphate Rash    Sulfa Antibiotics Rash   Zosyn [piperacillin Sod-tazobactam So] Rash        Medication List     STOP taking these medications    metaxalone 800 MG tablet Commonly known as: SKELAXIN   traMADol 50 MG tablet Commonly known as: ULTRAM       TAKE these medications    acetaminophen 650 MG CR tablet Commonly known as: TYLENOL Take 1,300 mg by mouth every 4 (four) hours as needed for pain.   aspirin EC 325 MG tablet Take 1 tablet (325 mg total) by mouth 2 (two) times daily after a meal. Take x 1 month post op to decrease risk of blood clots. What changed:  medication strength how much to take when to take this additional instructions   atorvastatin 40 MG tablet Commonly known as: LIPITOR Take 40 mg by mouth daily.   cephALEXin 500 MG capsule Commonly known as: KEFLEX Take 1 capsule by mouth 3 (three) times daily   docusate sodium 100 MG capsule Commonly known as: Colace Take 1 capsule (100 mg total) by mouth 2 (two) times daily.   furosemide 20 MG tablet Commonly known as: LASIX Take 40 mg by mouth 2 (two) times daily.   gabapentin 800 MG tablet Commonly known as: NEURONTIN Take 800 mg by mouth every 6 (six) hours. What changed: Another medication with the same name was removed. Continue taking this medication, and follow the directions you see here.   glimepiride 2 MG tablet Commonly known as: AMARYL Take 2 mg by mouth daily with breakfast.   guaiFENesin 600 MG 12 hr tablet Commonly known as: MUCINEX Take 600 mg by mouth 2 (two) times daily as needed for cough.   lisinopril 40 MG tablet Commonly known as: ZESTRIL Take 40 mg by mouth daily.   metFORMIN 500 MG 24 hr tablet Commonly known as: GLUCOPHAGE-XR Take 500 mg by mouth 2 (two) times daily.   oxyCODONE-acetaminophen 5-325 MG tablet Commonly known as: PERCOCET/ROXICET Take 1-2 tablets by mouth every 6 (six) hours as needed for severe pain.   tiZANidine 2 MG tablet Commonly known as:  ZANAFLEX Take 1 tablet by mouth every 8 (eight) hours as needed for muscle spasms.   Vitamin D3 50 MCG (2000 UT) capsule Take 2,000 Units by mouth daily.               Durable Medical Equipment  (From admission, onward)           Start     Ordered   02/14/22 1618  DME  Walker rolling  Once       Question:  Patient needs a walker to treat with the following condition  Answer:  Primary osteoarthritis of left knee   02/14/22 1617   02/14/22 1618  DME 3 n 1  Once        02/14/22 1617              Discharge Care Instructions  (From admission, onward)           Start     Ordered   02/15/22 0000  Weight bearing as tolerated        02/15/22 0827            Diagnostic Studies: No results found.  Disposition: Discharge disposition: 01-Home or Self Care       Discharge Instructions     Call MD / Call 911   Complete by: As directed    If you experience chest pain or shortness of breath, CALL 911 and be transported to the hospital emergency room.  If you develope a fever above 101 F, pus (white drainage) or increased drainage or redness at the wound, or calf pain, call your surgeon's office.   Constipation Prevention   Complete by: As directed    Drink plenty of fluids.  Prune juice may be helpful.  You may use a stool softener, such as Colace (over the counter) 100 mg twice a day.  Use MiraLax (over the counter) for constipation as needed.   Diet - low sodium heart healthy   Complete by: As directed    Driving restrictions   Complete by: As directed    No driving for 2 weeks   Increase activity slowly as tolerated   Complete by: As directed    Patient may shower   Complete by: As directed    You may shower without a dressing once there is no drainage.  Do not wash over the wound.  If drainage remains, cover wound with plastic wrap and then shower.   Post-operative opioid taper instructions:   Complete by: As directed    POST-OPERATIVE OPIOID TAPER  INSTRUCTIONS: It is important to wean off of your opioid medication as soon as possible. If you do not need pain medication after your surgery it is ok to stop day one. Opioids include: Codeine, Hydrocodone(Norco, Vicodin), Oxycodone(Percocet, oxycontin) and hydromorphone amongst others.  Long term and even short term use of opiods can cause: Increased pain response Dependence Constipation Depression Respiratory depression And more.  Withdrawal symptoms can include Flu like symptoms Nausea, vomiting And more Techniques to manage these symptoms Hydrate well Eat regular healthy meals Stay active Use relaxation techniques(deep breathing, meditating, yoga) Do Not substitute Alcohol to help with tapering If you have been on opioids for less than two weeks and do not have pain than it is ok to stop all together.  Plan to wean off of opioids This plan should start within one week post op of your joint replacement. Maintain the same interval or time between taking each dose and first decrease the dose.  Cut the total daily intake of opioids by one tablet each day Next start to increase the time between doses. The last dose that should be eliminated is the evening dose.      Weight bearing as tolerated   Complete by: As directed         Follow-up Information     Dorna Leitz, MD. Schedule an appointment as soon as possible for  a visit in 2 week(s).   Specialty: Orthopedic Surgery Contact information: [place] [phone]                  Signed: Joanell Rising 02/15/2022, 8:27 AM

## 2022-02-15 NOTE — Progress Notes (Signed)
Physical Therapy Treatment Patient Details Name: Willie Krueger MRN: [id number] DOB: 30-Sep-1956 Today's Date: 02/15/2022   History of Present Illness Pt s/p L TKR and with hx of neck surgery, DM with peripheral neuropathy, chronic pain syndrome, and osteoarthritis multiple joints    PT Comments    Pt very cooperative and progressing steadily with mobility but with mild buckling at L knee and decreased endurance.  Pt hopeful for dc home this pm.   Recommendations for follow up therapy are one component of a multi-disciplinary discharge planning process, led by the attending physician.  Recommendations may be updated based on patient status, additional functional criteria and insurance authorization.  Follow Up Recommendations  Home health PT     Assistance Recommended at Discharge Frequent or constant Supervision/Assistance  Patient can return home with the following A little help with bathing/dressing/bathroom;Assistance with cooking/housework;Assist for transportation;Help with stairs or ramp for entrance;A little help with walking and/or transfers   Equipment Recommendations  Rolling walker (2 wheels)    Recommendations for Other Services       Precautions / Restrictions Precautions Precautions: Knee;Fall Restrictions Weight Bearing Restrictions: No Other Position/Activity Restrictions: WBAT     Mobility  Bed Mobility Overal bed mobility: Needs Assistance Bed Mobility: Supine to Sit     Supine to sit: Min guard     General bed mobility comments: Increased time with min guard for L LE    Transfers Overall transfer level: Needs assistance Equipment used: Rolling walker (2 wheels) Transfers: Sit to/from Stand Sit to Stand: Min assist, From elevated surface           General transfer comment: cues for LE management and use of UEs to self assist    Ambulation/Gait Ambulation/Gait assistance: Min assist Gait Distance (Feet): 80 Feet Assistive device: Rolling  walker (2 wheels) Gait Pattern/deviations: Step-to pattern, Decreased step length - right, Decreased step length - left, Shuffle, Narrow base of support, Knees buckling Gait velocity: decr     General Gait Details: Increased time with cues for sequence, posture, position from RW and to increase BOS; difficulty with L knee extension with noted mild buckling   Stairs             Wheelchair Mobility    Modified Rankin (Stroke Patients Only)       Balance Overall balance assessment: Needs assistance Sitting-balance support: No upper extremity supported, Feet supported Sitting balance-Leahy Scale: Good     Standing balance support: Bilateral upper extremity supported Standing balance-Leahy Scale: Poor                              Cognition Arousal/Alertness: Awake/alert Behavior During Therapy: WFL for tasks assessed/performed Overall Cognitive Status: Within Functional Limits for tasks assessed                                          Exercises Total Joint Exercises Ankle Circles/Pumps: AROM, Both, 15 reps, Supine Quad Sets: AROM, Both, 10 reps, Supine Heel Slides: AAROM, Left, 15 reps, Supine Straight Leg Raises: AAROM, Left, 10 reps, Supine Goniometric ROM: -10 - 50 AAROM L knee    General Comments        Pertinent Vitals/Pain Pain Assessment Pain Assessment: 0-10 Pain Score: 4  Pain Location: L knee Pain Descriptors / Indicators: Aching, Sore Pain Intervention(s): Limited activity within patient's  tolerance, Monitored during session, Premedicated before session    Home Living                          Prior Function            PT Goals (current goals can now be found in the care plan section) Acute Rehab PT Goals Patient Stated Goal: Regain IND PT Goal Formulation: With patient Time For Goal Achievement: 02/21/22 Potential to Achieve Goals: Good Progress towards PT goals: Progressing toward goals     Frequency    7X/week      PT Plan Current plan remains appropriate    Co-evaluation              AM-PAC PT "6 Clicks" Mobility   Outcome Measure  Help needed turning from your back to your side while in a flat bed without using bedrails?: A Little Help needed moving from lying on your back to sitting on the side of a flat bed without using bedrails?: A Little Help needed moving to and from a bed to a chair (including a wheelchair)?: A Lot Help needed standing up from a chair using your arms (e.g., wheelchair or bedside chair)?: A Lot Help needed to walk in hospital room?: A Lot Help needed climbing 3-5 steps with a railing? : A Lot 6 Click Score: 14    End of Session Equipment Utilized During Treatment: Gait belt Activity Tolerance: Patient tolerated treatment well Patient left: in chair;with call bell/phone within reach;with chair alarm set Nurse Communication: Mobility status PT Visit Diagnosis: Difficulty in walking, not elsewhere classified (R26.2);Pain;Unsteadiness on feet (R26.81) Pain - Right/Left: Left Pain - part of body: Knee     Time: 0831-0908 PT Time Calculation (min) (ACUTE ONLY): 37 min  Charges:  $Gait Training: 8-22 mins $Therapeutic Exercise: 8-22 mins                     Debe Coder PT Acute Rehabilitation Services Pager [phone] Office [phone]    Willie Krueger 02/15/2022, 10:39 AM

## 2022-02-15 NOTE — Progress Notes (Signed)
Physical Therapy Treatment Patient Details Name: Willie Krueger MRN: [id number] DOB: 02/06/1956 Today's Date: 02/15/2022   History of Present Illness Pt s/p L TKR and with hx of neck surgery, DM with peripheral neuropathy, chronic pain syndrome, and osteoarthritis multiple joints    PT Comments    Pt continues motivated and progressing with mobility.  Pt up to ambulate limited distance in hall and negotiated stairs.  Pt now with no HHPT in place - states will call insurance of list on in network providers on Monday.  Pt reviewed therex from am session with 2 additional exercises added and written instruction provided and reviewed.  Pt states he feels comfortable with ability to manage at home and with HEP until HHPT can be arranged.  Recommendations for follow up therapy are one component of a multi-disciplinary discharge planning process, led by the attending physician.  Recommendations may be updated based on patient status, additional functional criteria and insurance authorization.  Follow Up Recommendations  Home health PT     Assistance Recommended at Discharge Frequent or constant Supervision/Assistance  Patient can return home with the following A little help with bathing/dressing/bathroom;Assistance with cooking/housework;Assist for transportation;Help with stairs or ramp for entrance;A little help with walking and/or transfers   Equipment Recommendations  Rolling walker (2 wheels)    Recommendations for Other Services       Precautions / Restrictions Precautions Precautions: Knee;Fall Restrictions Weight Bearing Restrictions: No Other Position/Activity Restrictions: WBAT     Mobility  Bed Mobility Overal bed mobility: Needs Assistance Bed Mobility: Supine to Sit     Supine to sit: Min guard     General bed mobility comments: Pt in wc for dc and returned to same    Transfers Overall transfer level: Needs assistance Equipment used: Rolling walker (2  wheels) Transfers: Sit to/from Stand Sit to Stand: Min guard           General transfer comment: cues for LE management and use of UEs to self assist    Ambulation/Gait Ambulation/Gait assistance: Min guard Gait Distance (Feet): 40 Feet Assistive device: Rolling walker (2 wheels) Gait Pattern/deviations: Step-to pattern, Decreased step length - right, Decreased step length - left, Shuffle, Narrow base of support Gait velocity: decr     General Gait Details: Increased time with cues for sequence, posture, position from RW and to increase BOS; difficulty with L knee extension with noted mild buckling   Stairs Stairs: Yes Stairs assistance: Min assist Stair Management: One rail Right, Step to pattern, Forwards, With crutches Number of Stairs: 5 General stair comments: 2+3 with crutch and rail and cues for sequence and foot/crutch placement   Wheelchair Mobility    Modified Rankin (Stroke Patients Only)       Balance Overall balance assessment: Needs assistance Sitting-balance support: No upper extremity supported, Feet supported Sitting balance-Leahy Scale: Good     Standing balance support: Bilateral upper extremity supported Standing balance-Leahy Scale: Poor                              Cognition Arousal/Alertness: Awake/alert Behavior During Therapy: WFL for tasks assessed/performed Overall Cognitive Status: Within Functional Limits for tasks assessed                                          Exercises Total Joint Exercises Ankle Circles/Pumps: AROM,  Both, 15 reps, Supine Quad Sets: AROM, Both, 5 reps, Seated Heel Slides: AAROM, 15 reps, 5 reps, Seated Hip ABduction/ADduction: AAROM, Left, 5 reps, Seated Straight Leg Raises: AAROM, Left, 5 reps, Seated Long Arc Quad: AAROM, AROM, Left, 10 reps, Seated Goniometric ROM: -10 - 50 AAROM L knee    General Comments        Pertinent Vitals/Pain Pain Assessment Pain  Assessment: 0-10 Pain Score: 4  Pain Location: L knee Pain Descriptors / Indicators: Aching, Sore Pain Intervention(s): Limited activity within patient's tolerance, Monitored during session, Premedicated before session    Home Living                          Prior Function            PT Goals (current goals can now be found in the care plan section) Acute Rehab PT Goals Patient Stated Goal: Regain IND PT Goal Formulation: With patient Time For Goal Achievement: 02/21/22 Potential to Achieve Goals: Good Progress towards PT goals: Progressing toward goals    Frequency    7X/week      PT Plan Current plan remains appropriate    Co-evaluation              AM-PAC PT "6 Clicks" Mobility   Outcome Measure  Help needed turning from your back to your side while in a flat bed without using bedrails?: A Little Help needed moving from lying on your back to sitting on the side of a flat bed without using bedrails?: A Little Help needed moving to and from a bed to a chair (including a wheelchair)?: A Little Help needed standing up from a chair using your arms (e.g., wheelchair or bedside chair)?: A Little Help needed to walk in hospital room?: A Little Help needed climbing 3-5 steps with a railing? : A Little 6 Click Score: 18    End of Session Equipment Utilized During Treatment: Gait belt Activity Tolerance: Patient tolerated treatment well Patient left: in chair;with call bell/phone within reach;with chair alarm set Nurse Communication: Mobility status PT Visit Diagnosis: Difficulty in walking, not elsewhere classified (R26.2);Pain;Unsteadiness on feet (R26.81) Pain - Right/Left: Left Pain - part of body: Knee     Time: 8099-8338 PT Time Calculation (min) (ACUTE ONLY): 50 min  Charges:  $Gait Training: 8-22 mins $Therapeutic Exercise: 8-22 mins $Therapeutic Activity: 8-22 mins                     Debe Coder PT Acute Rehabilitation  Services Pager [phone] Office [phone]    Ezekial Arns 02/15/2022, 5:10 PM

## 2022-02-15 NOTE — Progress Notes (Signed)
PATIENT ID: PARTHIV MUCCI  MRN: [id number]  DOB/AGE:  08-07-1956 / 66 y.o.  1 Day Post-Op Procedure(s) (LRB): TOTAL KNEE ARTHROPLASTY (Left)    PROGRESS NOTE Subjective: Patient is alert, oriented, no Nausea, no Vomiting, yes passing gas. Taking PO well. Denies SOB, Chest or Calf Pain. Using Incentive Spirometer, PAS in place. Ambulate WBAT , Patient reports pain as 0/10 .    Objective: Vital signs in last 24 hours: Vitals:   02/14/22 2037 02/15/22 0100 02/15/22 0533 02/15/22 0807  BP: 134/79 111/75 108/62 110/62  Pulse: 89 78 73 62  Resp: 16 16 16    Temp: 98 F (36.7 C) 98.8 F (37.1 C) 98 F (36.7 C) 98.2 F (36.8 C)  TempSrc: Oral Oral Oral Oral  SpO2: 99% 98% 98% 94%  Weight:      Height:          Intake/Output from previous day: I/O last 3 completed shifts: In: 4570.1 [P.O.:960; I.V.:3010.1; IV Piggyback:600] Out: 2975 [Urine:2950; Blood:25]   Intake/Output this shift: No intake/output data recorded.   LABORATORY DATA: Recent Labs    02/14/22 1635 02/14/22 2112 02/15/22 0355 02/15/22 0757  WBC  --   --  10.2  --   HGB  --   --  12.8*  --   HCT  --   --  39.2  --   PLT  --   --  156  --   GLUCAP 169* 202*  --  166*    Examination: Neurologically intact Neurovascular intact Sensation intact distally Intact pulses distally Dorsiflexion/Plantar flexion intact Incision: dressing C/D/I No cellulitis present Compartment soft}  Assessment:   1 Day Post-Op Procedure(s) (LRB): TOTAL KNEE ARTHROPLASTY (Left) ADDITIONAL DIAGNOSIS: Expected Acute Blood Loss Anemia,  Chronic opiate use Anticipated LOS equal to or greater than 2 midnights due to - Age 68 and older with one or more of the following:  - Obesity  - Expected need for hospital services (PT, OT, Nursing) required for safe  discharge  - Anticipated need for postoperative skilled nursing care or inpatient rehab    Plan: PT/OT WBAT, AROM and PROM  DVT Prophylaxis:  SCDx72hrs, ASA 325 mg BID x 2  weeks DISCHARGE PLAN: Home DISCHARGE NEEDS: HHPT, Walker, and 3-in-1 comode seat     Joanell Rising 02/15/2022, 8:23 AM

## 2022-02-15 NOTE — TOC Transition Note (Signed)
Transition of Care [facility]) - CM/SW Discharge Note  Patient Details  Name: Willie Krueger MRN: [id number] Date of Birth: 06-20-1956  [facility] CM/SW Contact:  Sherie Don, LCSW Phone Number: 02/15/2022, 11:29 AM  Clinical Narrative: Patient is expected to discharge home after working with PT. [facility] spoke with patient to discuss discharge plan and needs. Patient was referred to [facility] for [place], but was not accepted. Patient has a CPM and BSC at home, but will need a rolling walker. CSW made DME referral to [facility] with [employer]. [employer] to deliver walker to patient's room.  CSW referred patient to the following [place] agencies:  [facility]: out-of-network [facility]: out-of-network [facility]: no response [facility]: out-of-network [facility]: out-of-network [facility]: no response [facility]: out-of-network  CSW updated ortho PA that no HH agency would accept. Patient to follow up with Dr. Berenice Primas' office about an OPPT referral. CSW updated patient. TOC signing off.  Final next level of care: Home/Self Care Barriers to Discharge: No [facility] will accept this patient  Patient Goals and CMS Choice Patient states their goals for this hospitalization and ongoing recovery are:: Return home CMS Medicare.gov Compare Post Acute Care list provided to:: Patient Choice offered to / list presented to : Patient  Discharge Plan and Services        DME Arranged: Walker rolling DME Agency: [facility] Date DME Agency Contacted: 02/15/22 Representative spoke with at DME Agency: Brenton Grills  Readmission Risk Interventions No flowsheet data found.

## 2022-02-17 NOTE — Progress Notes (Signed)
° °  SUBJECTIVE Patient with a history of diabetes mellitus presents to office today complaining of elongated, thickened nails that cause pain while ambulating in shoes.  Patient is unable to trim their own nails.  Patient has a history of left hallux partial amputation which healed uneventfully several years prior.  Patient is here for further evaluation and treatment.   Past Medical History:  Diagnosis Date   Acute postoperative pain 02/09/2017   Chronic pain    Chronic pain associated with significant psychosocial dysfunction 05/04/2015   Last Assessment & Plan:  He is having worsening problems with breakthrough pain. Gabapentin is increased from 800 mg 3 times daily to 1200 mg 3 times daily. Oxycodone is also renewed. He continues to through evaluation at [facility] pain clinic.    Diabetes mellitus without complication (HCC)    Heart murmur    History of spinal stenosis 10/31/2015   C3-4 with severe central canal stenosis with small amount of myelomalacia present. Moderate to severe canal stenosis is also present at C4-5 and C5-6. Moderate to severe neuroforaminal narrowing between the levels of C2-C7   Hyperlipidemia    Hypertension    Leg weakness 07/07/2014   Mitral valve regurgitation    Nerve root inflammation 06/26/2014   Last Assessment & Plan:  Status is unchanged with ongoing pain and limited mobility. Continue with physical therapy and pain management.    Osteoarthritis     OBJECTIVE General Patient is awake, alert, and oriented x 3 and in no acute distress. Derm Skin is dry and supple bilateral. Negative open lesions or macerations. Remaining integument unremarkable. Nails are tender, long, thickened and dystrophic with subungual debris, consistent with onychomycosis, 1-5 bilateral. No signs of infection noted.   Vasc  DP and PT pedal pulses palpable bilaterally. Temperature gradient within normal limits.  Neuro Epicritic and protective threshold sensation diminished bilaterally.   Musculoskeletal Exam history of left partial hallux amputation with routine healing.  ASSESSMENT 1. Diabetes Mellitus w/ peripheral neuropathy 2.  Pain due to onychomycosis of toenails bilateral  PLAN OF CARE 1. Patient evaluated today. 2. Instructed to maintain good pedal hygiene and foot care. Stressed importance of controlling blood sugar.  3. Mechanical debridement of nails 1-5 bilaterally performed using a nail nipper. Filed with dremel without incident.  4. Return to clinic in 3 mos.     Edrick Kins, DPM [facility]  Dr. Edrick Kins, DPM    [address], [place] [address]                Office [phone]  Fax [phone]

## 2022-02-18 ENCOUNTER — Encounter (HOSPITAL_COMMUNITY): Payer: Self-pay | Admitting: Orthopedic Surgery

## 2022-02-24 DIAGNOSIS — M1712 Unilateral primary osteoarthritis, left knee: Secondary | ICD-10-CM | POA: Diagnosis present

## 2022-03-11 ENCOUNTER — Encounter: Payer: Self-pay | Admitting: Physical Therapy

## 2022-03-11 ENCOUNTER — Ambulatory Visit: Payer: POS | Attending: Orthopedic Surgery | Admitting: Physical Therapy

## 2022-03-11 ENCOUNTER — Other Ambulatory Visit: Payer: Self-pay

## 2022-03-11 DIAGNOSIS — R269 Unspecified abnormalities of gait and mobility: Secondary | ICD-10-CM | POA: Diagnosis present

## 2022-03-11 DIAGNOSIS — M25662 Stiffness of left knee, not elsewhere classified: Secondary | ICD-10-CM | POA: Diagnosis present

## 2022-03-11 DIAGNOSIS — M25562 Pain in left knee: Secondary | ICD-10-CM | POA: Diagnosis present

## 2022-03-11 DIAGNOSIS — Z96652 Presence of left artificial knee joint: Secondary | ICD-10-CM | POA: Insufficient documentation

## 2022-03-11 DIAGNOSIS — M6281 Muscle weakness (generalized): Secondary | ICD-10-CM | POA: Insufficient documentation

## 2022-03-11 NOTE — Patient Instructions (Signed)
Access Code: [email or web address] ?URL: [email or web address] ?Date: 03/11/2022 ?Prepared by: Dorcas Carrow ? ?Exercises ?Supine Quad Set - 2 x daily - 7 x weekly - 2 sets - 10 reps ?Supine Straight Leg Raises - 2 x daily - 7 x weekly - 2 sets - 10 reps ?Supine Heel Slides - 2 x daily - 7 x weekly - 1 sets - 10 reps - 20 seconds hold ?Seated Hamstring Stretch - 2 x daily - 7 x weekly - 1 sets - 5 reps - 20 seconds hold ?Standing Marching - 2 x daily - 7 x weekly - 2 sets - 10 reps ? ?

## 2022-03-12 NOTE — Therapy (Signed)
[place] ?[facility] ?[address]. [place], [place], [address] ?Phone: [phone]   Fax:  [phone] ? ?Physical Therapy Evaluation ? ?Patient Details  ?Name: Willie Krueger ?MRN: [id number] ?Date of Birth: [birth year] ?Referring Provider (PT): Dr. Dorna Leitz ? ? ?Encounter Date: 03/11/2022 ? ? PT End of Session - 03/12/22 1821   ? ? Visit Number 1   ? Number of Visits 13   ? Date for PT Re-Evaluation 04/22/22   ? Authorization - Visit Number 1   ? Authorization - Number of Visits 10   ? PT Start Time 1006   ? PT Stop Time 1100   ? PT Time Calculation (min) 54 min   ? Activity Tolerance Patient tolerated treatment well;Patient limited by pain   ? Behavior During Therapy [facility] for tasks assessed/performed   ? ?  ?  ? ?  ? ? ?Past Medical History:  ?Diagnosis Date  ? Acute postoperative pain 02/09/2017  ? Chronic pain   ? Chronic pain associated with significant psychosocial dysfunction 05/04/2015  ? Last Assessment & Plan:  He is having worsening problems with breakthrough pain. Gabapentin is increased from 800 mg 3 times daily to 1200 mg 3 times daily. Oxycodone is also renewed. He continues to through evaluation at [facility] pain clinic.   ? Diabetes mellitus without complication ([place])   ? Heart murmur   ? History of spinal stenosis 10/31/2015  ? C3-4 with severe central canal stenosis with small amount of myelomalacia present. Moderate to severe canal stenosis is also present at C4-5 and C5-6. Moderate to severe neuroforaminal narrowing between the levels of C2-C7  ? Hyperlipidemia   ? Hypertension   ? Leg weakness 07/07/2014  ? Mitral valve regurgitation   ? Nerve root inflammation 06/26/2014  ? Last Assessment & Plan:  Status is unchanged with ongoing pain and limited mobility. Continue with physical therapy and pain management.   ? Osteoarthritis   ? ? ?Past Surgical History:  ?Procedure Laterality Date  ? AMPUTATION TOE Left 10/2016  ? partial  ? CARPAL TUNNEL RELEASE    ? FOOT  SURGERY Left 09/2016  ? IRRIGATION AND DEBRIDEMENT FOOT Left 10/13/2016  ? Procedure: IRRIGATION AND DEBRIDEMENT FOOT;  Surgeon: Samara Deist, DPM;  Location: [facility] ORS;  Service: Podiatry;  Laterality: Left;  ? PICC LINE PLACE PERIPHERAL ([place] HX) Left 11-20017  ? SPINE SURGERY    ? TOTAL KNEE ARTHROPLASTY Left 02/14/2022  ? Procedure: TOTAL KNEE ARTHROPLASTY;  Surgeon: Dorna Leitz, MD;  Location: [facility];  Service: Orthopedics;  Laterality: Left;  ? ? ?There were no vitals filed for this visit. ? ? ? Subjective Assessment - 03/12/22 1808   ? ? Subjective Pt. is a pleasant 66 y/o male s/p L TKA on 02/14/22.  Pt. entered PT using single Lofstrand crutch on L side (PT explained proper use of crutch on R but pt. states he only feels comfortable on L side).  Pt. reports doing well and c/o 4/10 L knee pain at rest.  No falls but occasional stumbling and self-correcting balance.  Pt. lives with wife (active) and has no kids.  Pt. is a retired Administrator.   ? 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  Pt. has 6 horses, cattle and pigs on 110 acres.  Pt. wears B lower extremity compression stockings to manage swelling.  Pt. currently taking lasix/tramadol/percocet (every 8 hours).   ? Limitations House hold activities;Lifting;Standing;Walking   ?  Patient Stated Goals Ambulate without forearm crutch.  Improve balance and L knee ROM.   ? Currently in Pain? Yes   ? Pain Score 4    ? Pain Location Knee   ? Pain Orientation Left   ? Pain Type Acute pain;Surgical pain   ? Pain Onset More than a month ago   ? ?  ?  ? ?  ? ? ? ? ? [facility] PT Assessment - 03/12/22 0001   ? ?  ? Assessment  ? Medical Diagnosis s/p L TKA   ? Referring Provider (PT) Dr. Dorna Leitz   ? Onset Date/Surgical Date 02/14/22   ? Next MD Visit 03/20/22   ? Prior Therapy Yes   ?  ? Precautions  ? Precautions None   ?  ? Restrictions  ? Weight Bearing Restrictions No   ?  ? Balance Screen  ? Has the patient fallen in the past 6  months No   ? Has the patient had a decrease in activity level because of a fear of falling?  Yes   ?  ? Home Environment  ? Living Environment Private residence   ?  ? Prior Function  ? Level of Independence Independent   ?  ? Cognition  ? Overall Cognitive Status Within Functional Limits for tasks assessed   ? ?  ?  ? ?  ? ? ? ?See clinical impression. ? ?R LE strength grossly 5/5 MMT except hip flexion 4/5 MMT ?L LE strength grossly 4+/5 MMT except hip flexion/ abduction 4/5 MMT ? ?L antalgic gait with decrease knee extension/ step length ? ?Moderate forward head/ round shoulder/ flexed posture in sitting and standing.   ? ?Significant pitting edema/ fluid in B LE.   ? ? ?Objective measurements completed on examination: See above findings.  ? ? ? ? PT Education - 03/12/22 1821   ? ? Education Details Access Code: [email or web address]   ? Person(s) Educated Patient   ? Methods Explanation;Demonstration;Handout   ? Comprehension Verbalized understanding;Returned demonstration   ? ?  ?  ? ?  ? ? ? ? ? ? PT Long Term Goals - 03/12/22 1833   ? ?  ? PT LONG TERM GOAL #1  ? Title Pt. will increase FOTO to 58 to improve pain-free mobility.   ? Baseline initial FOTO: 41   ? Time 6   ? Period Weeks   ? Status New   ? Target Date 04/22/22   ?  ? PT LONG TERM GOAL #2  ? Title Pt. independent with HEP to increase L knee AROM extension to 0 deg. to improve gait pattern/ pain-free mobility.   ? Baseline R knee AROM: -3 to 110 deg. and strength grossly 5/5 MMT. L knee AROM: -10 to 109 deg. in supine position. L distal hamstring tightness/ guarded/ pain limted.   ? Time 6   ? Period Weeks   ? Status New   ? Target Date 04/22/22   ?  ? PT LONG TERM GOAL #3  ? Title Pt. will ambulate community distances with reciprocal gait pattern and no assistive device safetly to improve mobility around farm.   ? Baseline Pt. ambulates with limited L LE wt./ knee extension and use of lofstrand on L, instead of R side. Limited R LE step length during L LE  weight bearing due to joint stiffness.   ? Time 6   ? Period Weeks   ? Status New   ? Target Date 04/22/22   ?  ?  PT LONG TERM GOAL #4  ? Title Pt. will be able to get on/off horse with no L knee limitations to promote return to horseback riding.   ? Baseline Pt. has been unable to ride horses at this time.   ? Time 6   ? Period Weeks   ? Status New   ? Target Date 04/22/22   ? ?  ?  ? ?  ? ? ? ? ? ? ? ? ? Plan - 03/12/22 1824   ? ? Clinical Impression Statement Pt. is a pleasant 66 y/o male s/p L TKA on 02/14/2022.  Pt. reports 4/10 L knee pain currently at rest and increase pain with L knee extension.  Moderate bilateral LE swelling noted pt. wears compression stockings.  PT unable to fully assess secondary to pts. jeans too tight and PT will assess knee incision/ swelling next tx. session.  R knee AROM: -3 to 110 deg. and strength grossly 5/5 MMT.  L knee AROM: -10 to 109 deg. in supine position. L distal hamstring tightness/ guarded/ pain limted.  Pt. ambulates with limited L LE wt./ knee extension and use of lofstrand on L, instead of R side.  Limited R LE step length during L LE weight bearing due to joint stiffness.  FOTO: initial 41/ goal 58.  Pt. will benefit from skilled PT services to increase L knee AROM/ strength to improve pain-free mobility and return to working on farm.   ? Stability/Clinical Decision Making Evolving/Moderate complexity   ? Clinical Decision Making Moderate   ? Rehab Potential Good   ? PT Frequency 2x / week   ? PT Duration 6 weeks   ? PT Treatment/Interventions ADLs/Self Care Home Management;Aquatic Therapy;Cryotherapy;Moist Heat;Electrical Stimulation;Gait training;DME Instruction;Stair training;Functional mobility training;Neuromuscular re-education;Balance training;Therapeutic exercise;Therapeutic activities;Patient/family education;Manual techniques;Scar mobilization;Passive range of motion   ? PT Next Visit Plan Progress HEP/ reassess L knee extension.  Check incision/ LE  swelling   ? PT Home Exercise Plan Access Code: [email or web address]   ? ?  ?  ? ?  ? ? ?Patient will benefit from skilled therapeutic intervention in order to improve the following deficits and impairments:  Abnormal gai

## 2022-03-13 ENCOUNTER — Other Ambulatory Visit: Payer: Self-pay

## 2022-03-13 ENCOUNTER — Encounter: Payer: Self-pay | Admitting: Physical Therapy

## 2022-03-13 ENCOUNTER — Ambulatory Visit: Payer: POS | Admitting: Physical Therapy

## 2022-03-13 DIAGNOSIS — Z96652 Presence of left artificial knee joint: Secondary | ICD-10-CM | POA: Diagnosis not present

## 2022-03-13 NOTE — Therapy (Signed)
[facility] [facility] [address] [place], [place], [address] Phone: [phone]   Fax:  [phone]  Physical Therapy Treatment  Patient Details  Name: Willie Krueger MRN: [id number] Date of Birth: 24-Feb-1956 Referring Provider (PT): Dr. Jodi Geralds   Encounter Date: 03/13/2022   PT End of Session - 03/13/22 1026     Visit Number 2    Number of Visits 13    Date for PT Re-Evaluation 04/22/22    Authorization - Visit Number 2    Authorization - Number of Visits 10    PT Start Time 1024    PT Stop Time 1114    PT Time Calculation (min) 50 min    Activity Tolerance Patient tolerated treatment well;Patient limited by pain    Behavior During Therapy [facility] for tasks assessed/performed             Past Medical History:  Diagnosis Date   Acute postoperative pain 02/09/2017   Chronic pain    Chronic pain associated with significant psychosocial dysfunction 05/04/2015   Last Assessment & Plan:  He is having worsening problems with breakthrough pain. Gabapentin is increased from 800 mg 3 times daily to 1200 mg 3 times daily. Oxycodone is also renewed. He continues to through evaluation at [facility] pain clinic.    Diabetes mellitus without complication (HCC)    Heart murmur    History of spinal stenosis 10/31/2015   C3-4 with severe central canal stenosis with small amount of myelomalacia present. Moderate to severe canal stenosis is also present at C4-5 and C5-6. Moderate to severe neuroforaminal narrowing between the levels of C2-C7   Hyperlipidemia    Hypertension    Leg weakness 07/07/2014   Mitral valve regurgitation    Nerve root inflammation 06/26/2014   Last Assessment & Plan:  Status is unchanged with ongoing pain and limited mobility. Continue with physical therapy and pain management.    Osteoarthritis     Past Surgical History:  Procedure Laterality Date   AMPUTATION TOE Left 10/2016   partial   CARPAL TUNNEL RELEASE     FOOT  SURGERY Left 09/2016   IRRIGATION AND DEBRIDEMENT FOOT Left 10/13/2016   Procedure: IRRIGATION AND DEBRIDEMENT FOOT;  Surgeon: Gwyneth Revels, DPM;  Location: [facility] ORS;  Service: Podiatry;  Laterality: Left;   PICC LINE PLACE PERIPHERAL ([facility] HX) Left 11-20017   SPINE SURGERY     TOTAL KNEE ARTHROPLASTY Left 02/14/2022   Procedure: TOTAL KNEE ARTHROPLASTY;  Surgeon: Jodi Geralds, MD;  Location: [facility];  Service: Orthopedics;  Laterality: Left;    There were no vitals filed for this visit.   Subjective Assessment - 03/13/22 1024     Subjective Pt. states he continues to use ROM machine at home (company will pick up machine Friday) and doing HEP.  No new compliants.    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  Pt. has 6 horses, cattle and pigs on 110 acres.  Pt. wears B lower extremity compression stockings to manage swelling.  Pt. currently taking lasix/tramadol/percocet (every 8 hours).    Limitations House hold activities;Lifting;Standing;Walking    Patient Stated Goals Ambulate without forearm crutch.  Improve balance and L knee ROM.    Currently in Pain? Yes    Pain Score 4     Pain Location Knee    Pain Orientation Left    Pain Descriptors / Indicators Aching    Pain Onset More than a month  ago              Ther.ex.:  Walking in //-bars: high marching/ lateral walking 4 laps each (mirror feedback).    2nd step L knee flexion/lunges with static holds and hamstring stretches 3x 20 sec.    Recip. Stair climbing 4 steps x 3 with B UE assist.    Reviewed HEP: SLR/ SAQ/ marching/ LAQ 20x each.    Walking in clinic/ hallway/ outside with cuing to increase step pattern and heel strike.     Manual tx.:  Supine L knee AAROM (flexion/ extension)- 10x.  Supine patellar mobs (all planes)- discussed scar massage.  Assessment of incision/ scabs present but healing well.        PT Long Term Goals - 03/12/22 1833       PT LONG TERM GOAL  #1   Title Pt. will increase FOTO to 58 to improve pain-free mobility.    Baseline initial FOTO: 41    Time 6    Period Weeks    Status New    Target Date 04/22/22      PT LONG TERM GOAL #2   Title Pt. independent with HEP to increase L knee AROM extension to 0 deg. to improve gait pattern/ pain-free mobility.    Baseline R knee AROM: -3 to 110 deg. and strength grossly 5/5 MMT. L knee AROM: -10 to 109 deg. in supine position. L distal hamstring tightness/ guarded/ pain limted.    Time 6    Period Weeks    Status New    Target Date 04/22/22      PT LONG TERM GOAL #3   Title Pt. will ambulate community distances with reciprocal gait pattern and no assistive device safetly to improve mobility around farm.    Baseline Pt. ambulates with limited L LE wt./ knee extension and use of lofstrand on L, instead of R side. Limited R LE step length during L LE weight bearing due to joint stiffness.    Time 6    Period Weeks    Status New    Target Date 04/22/22      PT LONG TERM GOAL #4   Title Pt. will be able to get on/off horse with no L knee limitations to promote return to horseback riding.    Baseline Pt. has been unable to ride horses at this time.    Time 6    Period Weeks    Status New    Target Date 04/22/22                   Plan - 03/13/22 1026     Clinical Impression Statement Tx. session focused on normalizing gait pattern in //-bars and supine L LE ex. to encourage knee flexion/ extension.  Pt. limited wtih L knee extension and shows an increase in knee extension after patellar mobs/ hamstring stretches.  Pt. able to ascend/ descend stairs with recip. pattern and use of B handrails for safety.  Pt. ambulates with slow, controlled 2-point gait pattern with use of Lofstrand crutch on L side (pt. preference).  Pt. reports less pain in L knee after tx. session (3/10).  Good incision healing with scab noted at proximal and distal aspect of incision.    Stability/Clinical  Decision Making Evolving/Moderate complexity    Clinical Decision Making Moderate    Rehab Potential Good    PT Frequency 2x / week    PT Duration 6 weeks    PT Treatment/Interventions  ADLs/Self Care Home Management;Aquatic Therapy;Cryotherapy;Moist Heat;Electrical Stimulation;Gait training;DME Instruction;Stair training;Functional mobility training;Neuromuscular re-education;Balance training;Therapeutic exercise;Therapeutic activities;Patient/family education;Manual techniques;Scar mobilization;Passive range of motion    PT Next Visit Plan Progress HEP/ reassess L knee extension.    PT Home Exercise Plan Access Code: [facility]             Patient will benefit from skilled therapeutic intervention in order to improve the following deficits and impairments:  Abnormal gait, Decreased balance, Decreased endurance, Decreased mobility, Hypomobility, Difficulty walking, Decreased range of motion, Decreased scar mobility, Pain, Postural dysfunction, Impaired flexibility, Decreased strength, Decreased activity tolerance  Visit Diagnosis: S/P total knee replacement, left  Joint stiffness of knee, left  Muscle weakness (generalized)  Gait difficulty  Acute pain of left knee     Problem List Patient Active Problem List   Diagnosis Date Noted   Osteoarthritis of left knee 02/24/2022   Primary osteoarthritis of left knee 02/14/2022   Overweight 08/13/2020   Osteoarthritis of knees, bilateral 04/13/2020   Chronic venous stasis 03/12/2018   Degeneration of lumbosacral intervertebral disc 06/01/2017   History of spinal surgery 06/01/2017   Synovial cyst of lumbar spine 06/01/2017   Acute neck pain 06/01/2017   Osteoarthritis of shoulders (B) (R>L) 06/01/2017   Cervical spondylosis 06/01/2017   Osteoarthritis 03/16/2017   Enlarged thyroid 02/02/2017   Chronic pain syndrome 12/04/2016   Allergic drug rash due to anti-infective agent 11/28/2016   Allergy to drug 11/21/2016   Diabetic  foot ulcer with osteomyelitis (HCC) 11/14/2016   Diabetic infection of left foot (HCC) 10/13/2016   Lumbar spondylosis 06/03/2016   Lumbar facet syndrome (Location of Primary Source of Pain) (Bilateral) (R>L) 05/28/2016   Abnormal MRI, lumbar spine (05/19/2016) 05/28/2016   Neurogenic pain 02/27/2016   Diabetic peripheral neuropathy (HCC) 02/27/2016   Chronic shoulder pain (Location of Tertiary source of pain) (Bilateral) (R>L) 02/27/2016   Type 2 diabetes mellitus with foot ulcer (CODE) (HCC) 01/23/2016   Diabetic osteomyelitis (HCC) 12/05/2015   Neuropathy 12/05/2015   Obesity 12/05/2015   Encounter for chronic pain management 12/03/2015   Elevated C-reactive protein (CRP) 12/03/2015   Long term current use of opiate analgesic 10/31/2015   Long term prescription opiate use 10/31/2015   Opiate use (67.5 MME/Day) 10/31/2015   Opiate dependence (HCC) 10/31/2015   Encounter for therapeutic drug level monitoring 10/31/2015   Chronic neck pain (Right side) 10/31/2015   Cervical post-laminectomy syndrome 10/31/2015   Chronic low back pain (Location of Primary Source of Pain) (Midline pain) (R>L) 10/31/2015   Chronic cervical radicular pain (Bilateral) (R>L) 10/31/2015   Chronic lumbar radicular pain (Bilateral) (R>L) 10/31/2015   Substance use disorder Risk: LOW 10/31/2015   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   Chronic lower extremity pain (Location of Secondary source of pain) (Bilateral) (R>L) 10/31/2015   History of carpal tunnel release of both wrists 10/31/2015   Grade I anterolisthesis of C7 on T1 10/31/2015   Toe ulcer (HCC) 06/24/2015   Cervical spinal cord compression (HCC) 02/07/2015   MI (mitral incompetence) 11/11/2014   Cardiac murmur 11/09/2014   History of decompression of median nerve 05/19/2014   HLD (hyperlipidemia) 10/20/2012   Benign hypertension 10/20/2012   ED (erectile dysfunction) of organic origin 10/20/2012   Chronic hepatitis C virus infection (HCC)  10/02/2011   Adiposity 10/02/2011   Current smoker 10/02/2011   Cammie Mcgee, PT, DPT # [phone] 03/13/2022, 12:46 PM  [place] [facility] [address] [place], [place],  [address] Phone: [phone]   Fax:  [phone]  Name: Willie Krueger MRN: [id number] Date of Birth: October 09, 1956

## 2022-03-19 ENCOUNTER — Encounter: Payer: Self-pay | Admitting: Physical Therapy

## 2022-03-19 ENCOUNTER — Ambulatory Visit: Payer: POS | Admitting: Physical Therapy

## 2022-03-19 ENCOUNTER — Other Ambulatory Visit: Payer: Self-pay

## 2022-03-19 DIAGNOSIS — R269 Unspecified abnormalities of gait and mobility: Secondary | ICD-10-CM

## 2022-03-19 DIAGNOSIS — M25562 Pain in left knee: Secondary | ICD-10-CM

## 2022-03-19 DIAGNOSIS — M25662 Stiffness of left knee, not elsewhere classified: Secondary | ICD-10-CM

## 2022-03-19 DIAGNOSIS — Z96652 Presence of left artificial knee joint: Secondary | ICD-10-CM

## 2022-03-19 DIAGNOSIS — M6281 Muscle weakness (generalized): Secondary | ICD-10-CM

## 2022-03-19 NOTE — Therapy (Signed)
[place] ?[facility] ?[address] [place], [place], [address] ?Phone: [phone]   Fax:  [phone] ? ?Physical Therapy Treatment ? ?Patient Details  ?Name: Willie Krueger ?MRN: [id number] ?Date of Birth: 23-Feb-1956 ?Referring Provider (PT): Dr. Dorna Leitz ? ? ?Encounter Date: 03/19/2022 ? ? PT End of Session - 03/19/22 1109   ? ? Visit Number 3   ? Number of Visits 13   ? Date for PT Re-Evaluation 04/22/22   ? Authorization - Visit Number 3   ? Authorization - Number of Visits 10   ? PT Start Time 1108   ? PT Stop Time 1205   ? PT Time Calculation (min) 57 min   ? Activity Tolerance Patient tolerated treatment well;Patient limited by pain   ? Behavior During Therapy [facility] for tasks assessed/performed   ? ?  ?  ? ?  ? ? ?Past Medical History:  ?Diagnosis Date  ? Acute postoperative pain 02/09/2017  ? Chronic pain   ? Chronic pain associated with significant psychosocial dysfunction 05/04/2015  ? Last Assessment & Plan:  He is having worsening problems with breakthrough pain. Gabapentin is increased from 800 mg 3 times daily to 1200 mg 3 times daily. Oxycodone is also renewed. He continues to through evaluation at [facility] pain clinic.   ? Diabetes mellitus without complication ([place])   ? Heart murmur   ? History of spinal stenosis 10/31/2015  ? C3-4 with severe central canal stenosis with small amount of myelomalacia present. Moderate to severe canal stenosis is also present at C4-5 and C5-6. Moderate to severe neuroforaminal narrowing between the levels of C2-C7  ? Hyperlipidemia   ? Hypertension   ? Leg weakness 07/07/2014  ? Mitral valve regurgitation   ? Nerve root inflammation 06/26/2014  ? Last Assessment & Plan:  Status is unchanged with ongoing pain and limited mobility. Continue with physical therapy and pain management.   ? Osteoarthritis   ? ? ?Past Surgical History:  ?Procedure Laterality Date  ? AMPUTATION TOE Left 10/2016  ? partial  ? CARPAL TUNNEL RELEASE    ? FOOT  SURGERY Left 09/2016  ? IRRIGATION AND DEBRIDEMENT FOOT Left 10/13/2016  ? Procedure: IRRIGATION AND DEBRIDEMENT FOOT;  Surgeon: Samara Deist, DPM;  Location: [facility] ORS;  Service: Podiatry;  Laterality: Left;  ? PICC LINE PLACE PERIPHERAL ([place] HX) Left [id number]  ? SPINE SURGERY    ? TOTAL KNEE ARTHROPLASTY Left 02/14/2022  ? Procedure: TOTAL KNEE ARTHROPLASTY;  Surgeon: Dorna Leitz, MD;  Location: [facility] ORS;  Service: Orthopedics;  Laterality: Left;  ? ? ?There were no vitals filed for this visit. ? ? Subjective Assessment - 03/19/22 1247   ? ? Subjective Pt. reports he has been active at home and walking occasionally without use of Lofstrand crutches.  Pt. reports compliance with HEP.  No knee pain reported prior to PT tx. session.   ? 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  Pt. has 6 horses, cattle and pigs on 110 acres.  Pt. wears B lower extremity compression stockings to manage swelling.  Pt. currently taking lasix/tramadol/percocet (every 8 hours).   ? Limitations House hold activities;Lifting;Standing;Walking   ? Patient Stated Goals Ambulate without forearm crutch.  Improve balance and L knee ROM.   ? Currently in Pain? No/denies   ? ?  ?  ? ?  ? ? ? ?Ther.ex.: ? ?Nustep L3 seat position 13 10 min. B UE/LE (discussed weekend activities/  pain)- no knee pain prior to tx. session.  Pt. Reports he walking without Lofstrand crutch occasionally at home.     ?  ?Walking in //-bars: high marching/ lateral walking 4 laps each (mirror feedback).    ?  ?Recip. Stair climbing 4 steps x 3 with B UE assist.   ? ?Sitting hip flexion/ LAQ/ sit to stands 10x2 each.   ? ?Partial squats 20x each with mirror feedback.  Standing marching/ heel raises 20x.  Pharmacist, hospital.   ?  ?Walking in clinic/ hallway/ outside with cuing to increase step pattern and heel strike without use of Lofstrand crutch.     ?  ?  ?Manual tx.: ?  ?Supine L knee AAROM (flexion/ extension)- 6x. ?  ?Supine patellar  mobs (all planes)- distal scab not present and small spot of blood noted.  PT cleaned area and applied band aide.  Scab remains at superior aspect of incision.   ? ? ? ? ? PT Long Term Goals - 03/12/22 1833   ? ?  ? PT LONG TERM GOAL #1  ? Title Pt. will increase FOTO to 58 to improve pain-free mobility.   ? Baseline initial FOTO: 41   ? Time 6   ? Period Weeks   ? Status New   ? Target Date 04/22/22   ?  ? PT LONG TERM GOAL #2  ? Title Pt. independent with HEP to increase L knee AROM extension to 0 deg. to improve gait pattern/ pain-free mobility.   ? Baseline R knee AROM: -3 to 110 deg. and strength grossly 5/5 MMT. L knee AROM: -10 to 109 deg. in supine position. L distal hamstring tightness/ guarded/ pain limted.   ? Time 6   ? Period Weeks   ? Status New   ? Target Date 04/22/22   ?  ? PT LONG TERM GOAL #3  ? Title Pt. will ambulate community distances with reciprocal gait pattern and no assistive device safetly to improve mobility around farm.   ? Baseline Pt. ambulates with limited L LE wt./ knee extension and use of lofstrand on L, instead of R side. Limited R LE step length during L LE weight bearing due to joint stiffness.   ? Time 6   ? Period Weeks   ? Status New   ? Target Date 04/22/22   ?  ? PT LONG TERM GOAL #4  ? Title Pt. will be able to get on/off horse with no L knee limitations to promote return to horseback riding.   ? Baseline Pt. has been unable to ride horses at this time.   ? Time 6   ? Period Weeks   ? Status New   ? Target Date 04/22/22   ? ?  ?  ? ?  ? ? ? ? ? ? ? ? Plan - 03/19/22 1420   ? ? Clinical Impression Statement Pt. ambulates around PT clinic without use of Lofstrand crutch and consistent reciprocal pattern.  Pt. has moderate forward head/round shoulder posture and requires frequent cuing to correct during tx. session.  Pt. reports posture is due to previous neck surgery and PT discussed benefits of short-term use of posture brace with farm activities. L knee extension remains  limited and a small spot of blood is noted at distal aspect of incision.  PT cleaned the area and applied a bandaide.  No change to HEP and PT will issue a new standing based ther.ex. program next tx. session.   ?  Stability/Clinical Decision Making Evolving/Moderate complexity   ? Clinical Decision Making Moderate   ? Rehab Potential Good   ? PT Frequency 2x / week   ? PT Duration 6 weeks   ? PT Treatment/Interventions ADLs/Self Care Home Management;Aquatic Therapy;Cryotherapy;Moist Heat;Electrical Stimulation;Gait training;DME Instruction;Stair training;Functional mobility training;Neuromuscular re-education;Balance training;Therapeutic exercise;Therapeutic activities;Patient/family education;Manual techniques;Scar mobilization;Passive range of motion   ? PT Next Visit Plan Issue new HEP/ standing ther.ex.    Reassess L knee AROM   ? PT Home Exercise Plan Access Code: [email or web address]   ? ?  ?  ? ?  ? ? ?Patient will benefit from skilled therapeutic intervention in order to improve the following deficits and impairments:  Abnormal gait, Decreased balance, Decreased endurance, Decreased mobility, Hypomobility, Difficulty walking, Decreased range of motion, Decreased scar mobility, Pain, Postural dysfunction, Impaired flexibility, Decreased strength, Decreased activity tolerance ? ?Visit Diagnosis: ?S/P total knee replacement, left ? ?Joint stiffness of knee, left ? ?Muscle weakness (generalized) ? ?Gait difficulty ? ?Acute pain of left knee ? ? ? ? ?Problem List ?Patient Active Problem List  ? Diagnosis Date Noted  ? Osteoarthritis of left knee 02/24/2022  ? Primary osteoarthritis of left knee 02/14/2022  ? Overweight 08/13/2020  ? Osteoarthritis of knees, bilateral 04/13/2020  ? Chronic venous stasis 03/12/2018  ? Degeneration of lumbosacral intervertebral disc 06/01/2017  ? History of spinal surgery 06/01/2017  ? Synovial cyst of lumbar spine 06/01/2017  ? Acute neck pain 06/01/2017  ? 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 Cervical spondylosis 06/01/2017  ? Osteoarthritis 03/16/2017  ? Enlarged thyroid 02/02/2017  ? Chronic pain syndrome 12/04/2016  ? Allergic drug rash due to anti-infective agent 11/28/2016

## 2022-03-21 ENCOUNTER — Ambulatory Visit: Payer: POS | Admitting: Physical Therapy

## 2022-03-21 ENCOUNTER — Encounter: Payer: Self-pay | Admitting: Physical Therapy

## 2022-03-21 ENCOUNTER — Other Ambulatory Visit: Payer: Self-pay

## 2022-03-21 DIAGNOSIS — Z96652 Presence of left artificial knee joint: Secondary | ICD-10-CM

## 2022-03-21 DIAGNOSIS — M6281 Muscle weakness (generalized): Secondary | ICD-10-CM

## 2022-03-21 DIAGNOSIS — R269 Unspecified abnormalities of gait and mobility: Secondary | ICD-10-CM

## 2022-03-21 DIAGNOSIS — M25662 Stiffness of left knee, not elsewhere classified: Secondary | ICD-10-CM

## 2022-03-21 DIAGNOSIS — M25562 Pain in left knee: Secondary | ICD-10-CM

## 2022-03-21 NOTE — Patient Instructions (Signed)
Access Code: [email or web address] ?URL: [email or web address] ?Date: 03/21/2022 ?Prepared by: Dorcas Carrow ? ?Exercises ?- Standing March with Counter Support  - 1 x daily - 7 x weekly - 2 sets - 10 reps ?- Heel Raises with Counter Support  - 1 x daily - 7 x weekly - 2 sets - 10 reps ?- Mini Squat with Counter Support  - 1 x daily - 7 x weekly - 2 sets - 10 reps ?- Standing Hip Extension with Counter Support  - 1 x daily - 7 x weekly - 2 sets - 10 reps ?- Standing Hip Abduction with Counter Support  - 1 x daily - 7 x weekly - 2 sets - 10 reps ?

## 2022-03-21 NOTE — Therapy (Signed)
[place] ?[facility] ?[address] [place], [place], [address] ?Phone: [phone]   Fax:  [phone] ? ?Physical Therapy Treatment ? ?Patient Details  ?Name: Willie Krueger ?MRN: [id number] ?Date of Birth: [birth year] ?Referring Provider (PT): Dr. Dorna Leitz ? ? ?Encounter Date: 03/21/2022 ? ? PT End of Session - 03/21/22 1115   ? ? Visit Number 4   ? Number of Visits 13   ? Date for PT Re-Evaluation 04/22/22   ? Authorization - Visit Number 4   ? Authorization - Number of Visits 10   ? PT Start Time 1111   ? PT Stop Time 1208   ? PT Time Calculation (min) 57 min   ? Activity Tolerance Patient tolerated treatment well   ? Behavior During Therapy [facility] for tasks assessed/performed   ? ?  ?  ? ?  ? ? ?Past Medical History:  ?Diagnosis Date  ? Acute postoperative pain 02/09/2017  ? Chronic pain   ? Chronic pain associated with significant psychosocial dysfunction 05/04/2015  ? Last Assessment & Plan:  He is having worsening problems with breakthrough pain. Gabapentin is increased from 800 mg 3 times daily to 1200 mg 3 times daily. Oxycodone is also renewed. He continues to through evaluation at [facility] pain clinic.   ? Diabetes mellitus without complication ([place])   ? Heart murmur   ? History of spinal stenosis 10/31/2015  ? C3-4 with severe central canal stenosis with small amount of myelomalacia present. Moderate to severe canal stenosis is also present at C4-5 and C5-6. Moderate to severe neuroforaminal narrowing between the levels of C2-C7  ? Hyperlipidemia   ? Hypertension   ? Leg weakness 07/07/2014  ? Mitral valve regurgitation   ? Nerve root inflammation 06/26/2014  ? Last Assessment & Plan:  Status is unchanged with ongoing pain and limited mobility. Continue with physical therapy and pain management.   ? Osteoarthritis   ? ? ?Past Surgical History:  ?Procedure Laterality Date  ? AMPUTATION TOE Left 10/2016  ? partial  ? CARPAL TUNNEL RELEASE    ? FOOT SURGERY Left 09/2016  ?  IRRIGATION AND DEBRIDEMENT FOOT Left 10/13/2016  ? Procedure: IRRIGATION AND DEBRIDEMENT FOOT;  Surgeon: Samara Deist, DPM;  Location: [facility] ORS;  Service: Podiatry;  Laterality: Left;  ? PICC LINE PLACE PERIPHERAL ([place] HX) Left [id number]  ? SPINE SURGERY    ? TOTAL KNEE ARTHROPLASTY Left 02/14/2022  ? Procedure: TOTAL KNEE ARTHROPLASTY;  Surgeon: Dorna Leitz, MD;  Location: [facility];  Service: Orthopedics;  Laterality: Left;  ? ? ?There were no vitals filed for this visit. ? ? Subjective Assessment - 03/21/22 1112   ? ? Subjective Pt. reports MD is happy with knee progress and needs to focus on knee extension.  Pt. reports 2/10 L knee pain (generalized) while walking into PT clinic with use of single Lofstrand crutch.  Pt. ordered a posture brace and PT will size/ adjust next tx. session.   ? 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  Pt. has 6 horses, cattle and pigs on 110 acres.  Pt. wears B lower extremity compression stockings to manage swelling.  Pt. currently taking lasix/tramadol/percocet (every 8 hours).   ? Limitations House hold activities;Lifting;Standing;Walking   ? Patient Stated Goals Ambulate without forearm crutch.  Improve balance and L knee ROM.   ? Currently in Pain? Yes   ? Pain Score 2    ? Pain Location Knee   ?  Pain Orientation Left   ? Pain Descriptors / Indicators Aching   ? ?  ?  ? ?  ? ? ?Ther.ex.: ?  ? ?Walking in //-bars: high marching/ lateral walking 4 laps each Personal assistant).    ? ?Nustep R UE/BLE at L4 and seat position 13-14 10 min. B UE/LE (focus on knee extension).   ? ?Recip. Stair climbing 4 steps x 3 with B UE assist.   ? ?Walking in //-bars with alt. [place] taps with focus on hip/knee proprioception.  Cuing to correct posture/ mirror feedback.   ? ?Recip. Stairs (ascending/descending)- 5x on 4 steps with good technique/ UE assist for safety/ moderate cuing to correct posture and head position.   ?  ?Sitting hip flexion/ LAQ/ sit to stands  10x2 each.   ?  ?Partial squats 20x each with mirror feedback.   ? ?See new HEP (standing there.ex.).   ? ?Access Code: [email or web address] ?URL: [email or web address] ?Date: 03/21/2022 ?Prepared by: Dorcas Carrow ? ?Exercises ?- Standing March with Counter Support  - 1 x daily - 7 x weekly - 2 sets - 10 reps ?- Heel Raises with Counter Support  - 1 x daily - 7 x weekly - 2 sets - 10 reps ?- Mini Squat with Counter Support  - 1 x daily - 7 x weekly - 2 sets - 10 reps ?- Standing Hip Extension with Counter Support  - 1 x daily - 7 x weekly - 2 sets - 10 reps ?- Standing Hip Abduction with Counter Support  - 1 x daily - 7 x weekly - 2 sets - 10 reps ?  ?Walking in clinic/ hallway/ outside with cuing to increase step pattern and heel strike with use of Lofstrand crutch up curb/ grass terrain.  ?  ?  ?Manual tx.: ?  ?Supine L knee AAROM (flexion/ extension)- 7x with static holds.  STM to L distal quad/hamstring in supine position.   ?  ?Supine patellar mobs (all planes)- distal scab present  ?  ? ? ? PT Long Term Goals - 03/12/22 1833   ? ?  ? PT LONG TERM GOAL #1  ? Title Pt. will increase FOTO to 58 to improve pain-free mobility.   ? Baseline initial FOTO: 41   ? Time 6   ? Period Weeks   ? Status New   ? Target Date 04/22/22   ?  ? PT LONG TERM GOAL #2  ? Title Pt. independent with HEP to increase L knee AROM extension to 0 deg. to improve gait pattern/ pain-free mobility.   ? Baseline R knee AROM: -3 to 110 deg. and strength grossly 5/5 MMT. L knee AROM: -10 to 109 deg. in supine position. L distal hamstring tightness/ guarded/ pain limted.   ? Time 6   ? Period Weeks   ? Status New   ? Target Date 04/22/22   ?  ? PT LONG TERM GOAL #3  ? Title Pt. will ambulate community distances with reciprocal gait pattern and no assistive device safetly to improve mobility around farm.   ? Baseline Pt. ambulates with limited L LE wt./ knee extension and use of lofstrand on L, instead of R side. Limited R LE step length during  L LE weight bearing due to joint stiffness.   ? Time 6   ? Period Weeks   ? Status New   ? Target Date 04/22/22   ?  ? PT LONG TERM GOAL #4  ? Title Pt. will be  able to get on/off horse with no L knee limitations to promote return to horseback riding.   ? Baseline Pt. has been unable to ride horses at this time.   ? Time 6   ? Period Weeks   ? Status New   ? Target Date 04/22/22   ? ?  ?  ? ?  ? ? ? ? ? ? ? ? Plan - 03/21/22 1115   ? ? Clinical Impression Statement Pt. works hard during tx. session with good progress in L knee extension after manual tx./ patellar mobs./ PROM in supine position.  Pt. requires moderate cuing to correct upright posture/ head position with standing therex. New standing HEP issued today with light UE assist on counter.  Pt. instructed to increase R hip flexion/ step pattern when walking over grass/ outside terrain.  Pt. ambulates around PT clinic without use of Lofstrand but requires with outside walking, esp. when stepping up on curb/ maneuvering grass.  Pt. will continue to benefit from skilled PT services to increase L knee ROM/ strength and improve functional mobility.   ? Stability/Clinical Decision Making Evolving/Moderate complexity   ? Clinical Decision Making Moderate   ? Rehab Potential Good   ? PT Frequency 2x / week   ? PT Duration 6 weeks   ? PT Treatment/Interventions ADLs/Self Care Home Management;Aquatic Therapy;Cryotherapy;Moist Heat;Electrical Stimulation;Gait training;DME Instruction;Stair training;Functional mobility training;Neuromuscular re-education;Balance training;Therapeutic exercise;Therapeutic activities;Patient/family education;Manual techniques;Scar mobilization;Passive range of motion   ? PT Next Visit Plan Reassess L knee AROM.  Assess posture brace next tx.   ? PT Home Exercise Plan Access Code: [email or web address]   ? ?  ?  ? ?  ? ? ?Patient will benefit from skilled therapeutic intervention in order to improve the following deficits and impairments:  Abnormal  gait, Decreased balance, Decreased endurance, Decreased mobility, Hypomobility, Difficulty walking, Decreased range of motion, Decreased scar mobility, Pain, Postural dysfunction, Impaired flexibility, Dec

## 2022-03-26 ENCOUNTER — Encounter: Payer: Self-pay | Admitting: Physical Therapy

## 2022-03-26 ENCOUNTER — Ambulatory Visit: Payer: POS | Admitting: Physical Therapy

## 2022-03-26 ENCOUNTER — Other Ambulatory Visit: Payer: Self-pay

## 2022-03-26 DIAGNOSIS — M6281 Muscle weakness (generalized): Secondary | ICD-10-CM

## 2022-03-26 DIAGNOSIS — M25662 Stiffness of left knee, not elsewhere classified: Secondary | ICD-10-CM

## 2022-03-26 DIAGNOSIS — Z96652 Presence of left artificial knee joint: Secondary | ICD-10-CM

## 2022-03-26 NOTE — Therapy (Signed)
?OUTPATIENT PHYSICAL THERAPY TREATMENT NOTE ? ? ?Patient Name: Willie Krueger ?MRN: [id number] ?DOB:18-Feb-1956, 66 y.o., male ?Today's Date: 03/26/2022 ? ?PCP: Danae Orleans, MD ?REFERRING PROVIDER: Dorna Leitz, MD ? ? PT End of Session - 03/26/22 1056   ? ? Visit Number 5   ? Number of Visits 13   ? Date for PT Re-Evaluation 04/22/22   ? Authorization - Visit Number 5   ? Authorization - Number of Visits 10   ? PT Start Time 1110   ? PT Stop Time 1203   ? PT Time Calculation (min) 53 min   ? Activity Tolerance Patient tolerated treatment well   ? Behavior During Therapy [facility] for tasks assessed/performed   ? ?  ?  ? ?  ? ? ?Past Medical History:  ?Diagnosis Date  ? Acute postoperative pain 02/09/2017  ? Chronic pain   ? Chronic pain associated with significant psychosocial dysfunction 05/04/2015  ? Last Assessment & Plan:  He is having worsening problems with breakthrough pain. Gabapentin is increased from 800 mg 3 times daily to 1200 mg 3 times daily. Oxycodone is also renewed. He continues to through evaluation at [facility] pain clinic.   ? Diabetes mellitus without complication ([place])   ? Heart murmur   ? History of spinal stenosis 10/31/2015  ? C3-4 with severe central canal stenosis with small amount of myelomalacia present. Moderate to severe canal stenosis is also present at C4-5 and C5-6. Moderate to severe neuroforaminal narrowing between the levels of C2-C7  ? Hyperlipidemia   ? Hypertension   ? Leg weakness 07/07/2014  ? Mitral valve regurgitation   ? Nerve root inflammation 06/26/2014  ? Last Assessment & Plan:  Status is unchanged with ongoing pain and limited mobility. Continue with physical therapy and pain management.   ? Osteoarthritis   ? ?Past Surgical History:  ?Procedure Laterality Date  ? AMPUTATION TOE Left 10/2016  ? partial  ? CARPAL TUNNEL RELEASE    ? FOOT SURGERY Left 09/2016  ? IRRIGATION AND DEBRIDEMENT FOOT Left 10/13/2016  ? Procedure: IRRIGATION AND DEBRIDEMENT FOOT;  Surgeon: Samara Deist,  DPM;  Location: [facility] ORS;  Service: Podiatry;  Laterality: Left;  ? PICC LINE PLACE PERIPHERAL ([place] HX) Left 11-20017  ? SPINE SURGERY    ? TOTAL KNEE ARTHROPLASTY Left 02/14/2022  ? Procedure: TOTAL KNEE ARTHROPLASTY;  Surgeon: Dorna Leitz, MD;  Location: [facility] ORS;  Service: Orthopedics;  Laterality: Left;  ? ?Patient Active Problem List  ? Diagnosis Date Noted  ? Osteoarthritis of left knee 02/24/2022  ? Primary osteoarthritis of left knee 02/14/2022  ? Overweight 08/13/2020  ? Osteoarthritis of knees, bilateral 04/13/2020  ? Chronic venous stasis 03/12/2018  ? Degeneration of lumbosacral intervertebral disc 06/01/2017  ? History of spinal surgery 06/01/2017  ? Synovial cyst of lumbar spine 06/01/2017  ? Acute neck pain 06/01/2017  ? Osteoarthritis of shoulders (B) (R>L) 06/01/2017  ? Cervical spondylosis 06/01/2017  ? Osteoarthritis 03/16/2017  ? Enlarged thyroid 02/02/2017  ? Chronic pain syndrome 12/04/2016  ? Allergic drug rash due to anti-infective agent 11/28/2016  ? Allergy to drug 11/21/2016  ? Diabetic foot ulcer with osteomyelitis ([place]) 11/14/2016  ? Diabetic infection of left foot ([place]) 10/13/2016  ? Lumbar spondylosis 06/03/2016  ? Lumbar facet syndrome (Location of Primary Source of Pain) (Bilateral) (R>L) 05/28/2016  ? Abnormal MRI, lumbar spine (05/19/2016) 05/28/2016  ? Neurogenic pain 02/27/2016  ? Diabetic peripheral neuropathy ([place]) 02/27/2016  ? Chronic shoulder pain (Location of Tertiary  source of pain) (Bilateral) (R>L) 02/27/2016  ? Type 2 diabetes mellitus with foot ulcer (CODE) ([place]) 01/23/2016  ? Diabetic osteomyelitis ([place]) 12/05/2015  ? Neuropathy 12/05/2015  ? Obesity 12/05/2015  ? Encounter for chronic pain management 12/03/2015  ? Elevated C-reactive protein (CRP) 12/03/2015  ? Long term current use of opiate analgesic 10/31/2015  ? Long term prescription opiate use 10/31/2015  ? Opiate use (67.5 MME/Day) 10/31/2015  ? Opiate dependence ([place]) 10/31/2015  ? Encounter for therapeutic  drug level monitoring 10/31/2015  ? Chronic neck pain (Right side) 10/31/2015  ? Cervical post-laminectomy syndrome 10/31/2015  ? Chronic low back pain (Location of Primary Source of Pain) (Midline pain) (R>L) 10/31/2015  ? Chronic cervical radicular pain (Bilateral) (R>L) 10/31/2015  ? Chronic lumbar radicular pain (Bilateral) (R>L) 10/31/2015  ? Substance use disorder Risk: LOW 10/31/2015  ? 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 Chronic lower extremity pain (Location of Secondary source of pain) (Bilateral) (R>L) 10/31/2015  ? History of carpal tunnel release of both wrists 10/31/2015  ? Grade I anterolisthesis of C7 on T1 10/31/2015  ? Toe ulcer ([place]) 06/24/2015  ? Cervical spinal cord compression ([place]) 02/07/2015  ? MI (mitral incompetence) 11/11/2014  ? Cardiac murmur 11/09/2014  ? History of decompression of median nerve 05/19/2014  ? HLD (hyperlipidemia) 10/20/2012  ? Benign hypertension 10/20/2012  ? ED (erectile dysfunction) of organic origin 10/20/2012  ? Chronic hepatitis C virus infection ([place]) 10/02/2011  ? Adiposity 10/02/2011  ? Current smoker 10/02/2011  ? ? ?REFERRING DIAG: S/p L TKA ? ?THERAPY DIAG:  ?S/P total knee replacement, left ? ?Joint stiffness of knee, left ? ?Muscle weakness (generalized) ? ?PERTINENT HISTORY:  ? ?PRECAUTIONS: Fall ? ?SUBJECTIVE:  ?Pt. Has been wearing posture brace at home with walking and states he is walking better.  Pt. Forgot to bring to PT today.  No falls reported.  Pt. Is rarely using Lofstrand crutch.   ? ?Pt. is a pleasant 65 y/o male s/p L TKA on 02/14/22.  Pt. entered PT using single Lofstrand crutch on L side (PT explained proper use of crutch on R but pt. states he only feels comfortable on L side).  Pt. reports doing well and c/o 4/10 L knee pain at rest.  No falls but occasional stumbling and self-correcting balance.  Pt. lives with wife (active) and has no kids.  Pt. is a retired Administrator.  ? ?Pt. enjoys working in [employer] garden and riding  horses.  Pt. has 6 horses, cattle and pigs on 110 acres.  Pt. wears B lower extremity compression stockings to manage swelling.  Pt. currently taking lasix/tramadol/percocet (every 8 hours).  ? ?PAIN:  ?Are you having pain? Yes: NPRS scale: 2/10 ?Pain location: L knee ?Pain description: aching ?Aggravating factors: sitting for prolonged periods of time ?Relieving factors: walking ? ?TODAY'S TREATMENT:  ?Ther.ex.: ?  ?Walking in //-bars: high marching/ lateral walking 5 laps each (mirror feedback).    ?  ?Nustep BUE/LE at L4 and seat position 12-11 for 10 min. B UE/LE (focus on flexion and then extension seat position 14).   ?  ?Recip. Stair climbing 4 steps x 3 with light B UE assist on handrails.   ?  ?Reviewed HEP/ standing hip ex. 20x each.  Increase squats from gray chair with //-bars assist and mirror feedback (added partial lunges 10x L/R)  ?  ?Walking in clinic/ hallway/ outside with cuing to increase step pattern and heel strike with use of Lofstrand crutch up  curb/ grass terrain.  ? ?Seated marching/ LAQ 20x (mirror feedback)- moderate cuing to correct posture.   ? ?Supine L SLR 10x (cuing to maintain L knee ext/ quad activation) ?  ?  ?Manual tx.: ?  ?Supine L knee AAROM (flexion/ extension)- 8x with static holds.  STM to L distal quad/hamstring in supine position. ? ?L knee AROM in supine:  -6 to 117 deg. After manual tx.   ?  ?Supine patellar mobs (all planes)- distal scab present  ?  ? ? ?PATIENT EDUCATION: ?Education details: Reviewed HEP ?Person educated: Patient ?Education method: Explanation and Demonstration ?Education comprehension: verbalized understanding and returned demonstration ? ? ?HOME EXERCISE PROGRAM: ?Access Code: [email or web address] ?URL: [email or web address] ?Date: 03/21/2022 ?Prepared by: Dorcas Carrow ?  ?Exercises ?- Standing March with Counter Support  - 1 x daily - 7 x weekly - 2 sets - 10 reps ?- Heel Raises with Counter Support  - 1 x daily - 7 x weekly - 2 sets - 10  reps ?- Mini Squat with Counter Support  - 1 x daily - 7 x weekly - 2 sets - 10 reps ?- Standing Hip Extension with Counter Support  - 1 x daily - 7 x weekly - 2 sets - 10 reps ?- Standing Hip Abduction with

## 2022-03-28 ENCOUNTER — Ambulatory Visit: Payer: POS | Admitting: Physical Therapy

## 2022-03-28 ENCOUNTER — Encounter: Payer: Self-pay | Admitting: Physical Therapy

## 2022-03-28 DIAGNOSIS — M6281 Muscle weakness (generalized): Secondary | ICD-10-CM

## 2022-03-28 DIAGNOSIS — M25662 Stiffness of left knee, not elsewhere classified: Secondary | ICD-10-CM

## 2022-03-28 DIAGNOSIS — Z96652 Presence of left artificial knee joint: Secondary | ICD-10-CM | POA: Diagnosis not present

## 2022-03-28 DIAGNOSIS — M25562 Pain in left knee: Secondary | ICD-10-CM

## 2022-03-28 DIAGNOSIS — R269 Unspecified abnormalities of gait and mobility: Secondary | ICD-10-CM

## 2022-03-28 NOTE — Therapy (Signed)
?OUTPATIENT PHYSICAL THERAPY TREATMENT NOTE ? ? ?Patient Name: Willie Krueger ?MRN: [id number] ?DOB:[birth year], 66 y.o., male ?Today's Date: 03/28/2022 ? ?PCP: Danae Orleans, MD ?REFERRING PROVIDER: Dorna Leitz, MD ? ? PT End of Session - 03/28/22 0925   ? ? Visit Number 6   ? Number of Visits 13   ? Date for PT Re-Evaluation 04/22/22   ? Authorization - Visit Number 6   ? Authorization - Number of Visits 10   ? PT Start Time 1128   ? PT Stop Time 1210   ? PT Time Calculation (min) 42 min   ? Activity Tolerance Patient tolerated treatment well   ? Behavior During Therapy [facility] for tasks assessed/performed   ? ?  ?  ? ?  ? ? ?Past Medical History:  ?Diagnosis Date  ? Acute postoperative pain 02/09/2017  ? Chronic pain   ? Chronic pain associated with significant psychosocial dysfunction 05/04/2015  ? Last Assessment & Plan:  He is having worsening problems with breakthrough pain. Gabapentin is increased from 800 mg 3 times daily to 1200 mg 3 times daily. Oxycodone is also renewed. He continues to through evaluation at [facility] pain clinic.   ? Diabetes mellitus without complication ([place])   ? Heart murmur   ? History of spinal stenosis 10/31/2015  ? C3-4 with severe central canal stenosis with small amount of myelomalacia present. Moderate to severe canal stenosis is also present at C4-5 and C5-6. Moderate to severe neuroforaminal narrowing between the levels of C2-C7  ? Hyperlipidemia   ? Hypertension   ? Leg weakness 07/07/2014  ? Mitral valve regurgitation   ? Nerve root inflammation 06/26/2014  ? Last Assessment & Plan:  Status is unchanged with ongoing pain and limited mobility. Continue with physical therapy and pain management.   ? Osteoarthritis   ? ?Past Surgical History:  ?Procedure Laterality Date  ? AMPUTATION TOE Left 10/2016  ? partial  ? CARPAL TUNNEL RELEASE    ? FOOT SURGERY Left 09/2016  ? IRRIGATION AND DEBRIDEMENT FOOT Left 10/13/2016  ? Procedure: IRRIGATION AND DEBRIDEMENT FOOT;  Surgeon: Samara Deist,  DPM;  Location: [facility] ORS;  Service: Podiatry;  Laterality: Left;  ? PICC LINE PLACE PERIPHERAL ([place] HX) Left [id number]  ? SPINE SURGERY    ? TOTAL KNEE ARTHROPLASTY Left 02/14/2022  ? Procedure: TOTAL KNEE ARTHROPLASTY;  Surgeon: Dorna Leitz, MD;  Location: [facility];  Service: Orthopedics;  Laterality: Left;  ? ?Patient Active Problem List  ? Diagnosis Date Noted  ? Osteoarthritis of left knee 02/24/2022  ? Primary osteoarthritis of left knee 02/14/2022  ? Overweight 08/13/2020  ? Osteoarthritis of knees, bilateral 04/13/2020  ? Chronic venous stasis 03/12/2018  ? Degeneration of lumbosacral intervertebral disc 06/01/2017  ? History of spinal surgery 06/01/2017  ? Synovial cyst of lumbar spine 06/01/2017  ? Acute neck pain 06/01/2017  ? Osteoarthritis of shoulders (B) (R>L) 06/01/2017  ? Cervical spondylosis 06/01/2017  ? Osteoarthritis 03/16/2017  ? Enlarged thyroid 02/02/2017  ? Chronic pain syndrome 12/04/2016  ? Allergic drug rash due to anti-infective agent 11/28/2016  ? Allergy to drug 11/21/2016  ? Diabetic foot ulcer with osteomyelitis ([place]) 11/14/2016  ? Diabetic infection of left foot ([place]) 10/13/2016  ? Lumbar spondylosis 06/03/2016  ? Lumbar facet syndrome (Location of Primary Source of Pain) (Bilateral) (R>L) 05/28/2016  ? Abnormal MRI, lumbar spine (05/19/2016) 05/28/2016  ? Neurogenic pain 02/27/2016  ? Diabetic peripheral neuropathy ([place]) 02/27/2016  ? Chronic shoulder pain (Location of Tertiary  source of pain) (Bilateral) (R>L) 02/27/2016  ? Type 2 diabetes mellitus with foot ulcer (CODE) ([place]) 01/23/2016  ? Diabetic osteomyelitis ([place]) 12/05/2015  ? Neuropathy 12/05/2015  ? Obesity 12/05/2015  ? Encounter for chronic pain management 12/03/2015  ? Elevated C-reactive protein (CRP) 12/03/2015  ? Long term current use of opiate analgesic 10/31/2015  ? Long term prescription opiate use 10/31/2015  ? Opiate use (67.5 MME/Day) 10/31/2015  ? Opiate dependence ([place]) 10/31/2015  ? Encounter for therapeutic  drug level monitoring 10/31/2015  ? Chronic neck pain (Right side) 10/31/2015  ? Cervical post-laminectomy syndrome 10/31/2015  ? Chronic low back pain (Location of Primary Source of Pain) (Midline pain) (R>L) 10/31/2015  ? Chronic cervical radicular pain (Bilateral) (R>L) 10/31/2015  ? Chronic lumbar radicular pain (Bilateral) (R>L) 10/31/2015  ? Substance use disorder Risk: LOW 10/31/2015  ? 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 Chronic lower extremity pain (Location of Secondary source of pain) (Bilateral) (R>L) 10/31/2015  ? History of carpal tunnel release of both wrists 10/31/2015  ? Grade I anterolisthesis of C7 on T1 10/31/2015  ? Toe ulcer ([place]) 06/24/2015  ? Cervical spinal cord compression ([place]) 02/07/2015  ? MI (mitral incompetence) 11/11/2014  ? Cardiac murmur 11/09/2014  ? History of decompression of median nerve 05/19/2014  ? HLD (hyperlipidemia) 10/20/2012  ? Benign hypertension 10/20/2012  ? ED (erectile dysfunction) of organic origin 10/20/2012  ? Chronic hepatitis C virus infection ([place]) 10/02/2011  ? Adiposity 10/02/2011  ? Current smoker 10/02/2011  ? ? ?REFERRING DIAG: S/p L TKA ? ?THERAPY DIAG:  ?S/P total knee replacement, left ? ?Joint stiffness of knee, left ? ?Muscle weakness (generalized) ? ?Gait difficulty ? ?Acute pain of left knee ? ?PERTINENT HISTORY:  ? ?PRECAUTIONS: Fall ? ?SUBJECTIVE:  ?Pt. States he was rushing to get to PT today and got a speeding ticket on his way to PT.  Pt. Reports 1/10 L knee pain prior to PT tx. Session.   ? ?Pt. is a pleasant 66 y/o male s/p L TKA on 02/14/22.  Pt. entered PT using single Lofstrand crutch on L side (PT explained proper use of crutch on R but pt. states he only feels comfortable on L side).  Pt. reports doing well and c/o 4/10 L knee pain at rest.  No falls but occasional stumbling and self-correcting balance.  Pt. lives with wife (active) and has no kids.  Pt. is a retired Administrator.  ? ?Pt. enjoys working in [employer] garden and  riding horses.  Pt. has 6 horses, cattle and pigs on 110 acres.  Pt. wears B lower extremity compression stockings to manage swelling.  Pt. currently taking lasix/tramadol/percocet (every 8 hours).  ? ?PAIN:  ?Are you having pain? Yes: NPRS scale: 1/10 ?Pain location: L knee ?Pain description: aching ?Aggravating factors: sitting for prolonged periods of time ?Relieving factors: walking ? ?TODAY'S TREATMENT:  ? ?Ther.ex.: ? ?Walking at agility ladder/ high marching/ lateral walking 2 laps L/R.    ?  ?Recip. Stair climbing 4 steps x 7 with light B UE assist on handrails.  Cuing to correct posture.   ?  ?Walking in clinic/ hallway/ outside with cuing to increase step pattern and heel strike with use of Lofstrand crutch up curb/ grass terrain.  ? ?Seated marching/ LAQ 20x (mirror feedback)- moderate cuing to correct posture.   ? ?Nustep BUE/LE at L4 and seat position 11-10 for 10 min. B UE/LE (focus on flexion and then extension seat position 14).   ?  ?  ?  Manual tx.: ?  ?Supine L knee AAROM (flexion/ extension)- 5x with static holds.  STM to L distal quad/hamstring in supine position. ? ?L knee AROM in supine:  -12 to 117 deg. After manual tx.   ?  ?Supine patellar mobs (all planes)- proximal/ distal scabs still present ?  ? ? ?PATIENT EDUCATION: ?Education details: Reviewed HEP ?Person educated: Patient ?Education method: Explanation and Demonstration ?Education comprehension: verbalized understanding and returned demonstration ? ? ?HOME EXERCISE PROGRAM: ?Access Code: [email or web address] ?URL: [email or web address] ?Date: 03/21/2022 ?Prepared by: Dorcas Carrow ?  ?Exercises ?- Standing March with Counter Support  - 1 x daily - 7 x weekly - 2 sets - 10 reps ?- Heel Raises with Counter Support  - 1 x daily - 7 x weekly - 2 sets - 10 reps ?- Mini Squat with Counter Support  - 1 x daily - 7 x weekly - 2 sets - 10 reps ?- Standing Hip Extension with Counter Support  - 1 x daily - 7 x weekly - 2 sets - 10 reps ?-  Standing Hip Abduction with Counter Support  - 1 x daily - 7 x weekly - 2 sets - 10 reps ? ? ? ? PT Long Term Goals -  ? ?  ? PT LONG TERM GOAL #1  ? Title Pt. will increase FOTO to 58 to improve pain-free

## 2022-04-02 ENCOUNTER — Ambulatory Visit: Payer: POS | Admitting: Physical Therapy

## 2022-04-04 ENCOUNTER — Ambulatory Visit: Payer: POS | Admitting: Physical Therapy

## 2022-04-09 ENCOUNTER — Encounter: Payer: Self-pay | Admitting: Physical Therapy

## 2022-04-09 ENCOUNTER — Ambulatory Visit: Payer: POS | Attending: Orthopedic Surgery | Admitting: Physical Therapy

## 2022-04-09 DIAGNOSIS — M25562 Pain in left knee: Secondary | ICD-10-CM | POA: Diagnosis present

## 2022-04-09 DIAGNOSIS — Z96652 Presence of left artificial knee joint: Secondary | ICD-10-CM | POA: Insufficient documentation

## 2022-04-09 DIAGNOSIS — M6281 Muscle weakness (generalized): Secondary | ICD-10-CM | POA: Insufficient documentation

## 2022-04-09 DIAGNOSIS — R269 Unspecified abnormalities of gait and mobility: Secondary | ICD-10-CM | POA: Insufficient documentation

## 2022-04-09 DIAGNOSIS — M25662 Stiffness of left knee, not elsewhere classified: Secondary | ICD-10-CM | POA: Diagnosis present

## 2022-04-09 NOTE — Therapy (Signed)
?OUTPATIENT PHYSICAL THERAPY TREATMENT NOTE ? ? ?Patient Name: Willie Krueger ?MRN: [id number] ?DOB:1956-10-14, 66 y.o., male ?Today's Date: 04/09/2022 ? ?PCP: Danae Orleans, MD ?REFERRING PROVIDER: Dorna Leitz, MD ? ? PT End of Session - 04/09/22 1122   ? ? Visit Number 7   ? Number of Visits 13   ? Date for PT Re-Evaluation 04/22/22   ? Authorization - Visit Number 7   ? Authorization - Number of Visits 10   ? PT Start Time 1108   ? PT Stop Time 3419   ? PT Time Calculation (min) 51 min   ? Activity Tolerance Patient tolerated treatment well   ? Behavior During Therapy [facility] for tasks assessed/performed   ? ?  ?  ? ?  ? ? ?Past Medical History:  ?Diagnosis Date  ? Acute postoperative pain 02/09/2017  ? Chronic pain   ? Chronic pain associated with significant psychosocial dysfunction 05/04/2015  ? Last Assessment & Plan:  He is having worsening problems with breakthrough pain. Gabapentin is increased from 800 mg 3 times daily to 1200 mg 3 times daily. Oxycodone is also renewed. He continues to through evaluation at [facility] pain clinic.   ? Diabetes mellitus without complication ([place])   ? Heart murmur   ? History of spinal stenosis 10/31/2015  ? C3-4 with severe central canal stenosis with small amount of myelomalacia present. Moderate to severe canal stenosis is also present at C4-5 and C5-6. Moderate to severe neuroforaminal narrowing between the levels of C2-C7  ? Hyperlipidemia   ? Hypertension   ? Leg weakness 07/07/2014  ? Mitral valve regurgitation   ? Nerve root inflammation 06/26/2014  ? Last Assessment & Plan:  Status is unchanged with ongoing pain and limited mobility. Continue with physical therapy and pain management.   ? Osteoarthritis   ? ?Past Surgical History:  ?Procedure Laterality Date  ? AMPUTATION TOE Left 10/2016  ? partial  ? CARPAL TUNNEL RELEASE    ? FOOT SURGERY Left 09/2016  ? IRRIGATION AND DEBRIDEMENT FOOT Left 10/13/2016  ? Procedure: IRRIGATION AND DEBRIDEMENT FOOT;  Surgeon: Samara Deist,  DPM;  Location: [facility] ORS;  Service: Podiatry;  Laterality: Left;  ? PICC LINE PLACE PERIPHERAL ([place] HX) Left [id number]  ? SPINE SURGERY    ? TOTAL KNEE ARTHROPLASTY Left 02/14/2022  ? Procedure: TOTAL KNEE ARTHROPLASTY;  Surgeon: Dorna Leitz, MD;  Location: [facility] ORS;  Service: Orthopedics;  Laterality: Left;  ? ?Patient Active Problem List  ? Diagnosis Date Noted  ? Osteoarthritis of left knee 02/24/2022  ? Primary osteoarthritis of left knee 02/14/2022  ? Overweight 08/13/2020  ? Osteoarthritis of knees, bilateral 04/13/2020  ? Chronic venous stasis 03/12/2018  ? Degeneration of lumbosacral intervertebral disc 06/01/2017  ? History of spinal surgery 06/01/2017  ? Synovial cyst of lumbar spine 06/01/2017  ? Acute neck pain 06/01/2017  ? Osteoarthritis of shoulders (B) (R>L) 06/01/2017  ? Cervical spondylosis 06/01/2017  ? Osteoarthritis 03/16/2017  ? Enlarged thyroid 02/02/2017  ? Chronic pain syndrome 12/04/2016  ? Allergic drug rash due to anti-infective agent 11/28/2016  ? Allergy to drug 11/21/2016  ? Diabetic foot ulcer with osteomyelitis ([place]) 11/14/2016  ? Diabetic infection of left foot ([place]) 10/13/2016  ? Lumbar spondylosis 06/03/2016  ? Lumbar facet syndrome (Location of Primary Source of Pain) (Bilateral) (R>L) 05/28/2016  ? Abnormal MRI, lumbar spine (05/19/2016) 05/28/2016  ? Neurogenic pain 02/27/2016  ? Diabetic peripheral neuropathy ([place]) 02/27/2016  ? Chronic shoulder pain (Location of Tertiary  source of pain) (Bilateral) (R>L) 02/27/2016  ? Type 2 diabetes mellitus with foot ulcer (CODE) ([place]) 01/23/2016  ? Diabetic osteomyelitis ([place]) 12/05/2015  ? Neuropathy 12/05/2015  ? Obesity 12/05/2015  ? Encounter for chronic pain management 12/03/2015  ? Elevated C-reactive protein (CRP) 12/03/2015  ? Long term current use of opiate analgesic 10/31/2015  ? Long term prescription opiate use 10/31/2015  ? Opiate use (67.5 MME/Day) 10/31/2015  ? Opiate dependence ([place]) 10/31/2015  ? Encounter for therapeutic  drug level monitoring 10/31/2015  ? Chronic neck pain (Right side) 10/31/2015  ? Cervical post-laminectomy syndrome 10/31/2015  ? Chronic low back pain (Location of Primary Source of Pain) (Midline pain) (R>L) 10/31/2015  ? Chronic cervical radicular pain (Bilateral) (R>L) 10/31/2015  ? Chronic lumbar radicular pain (Bilateral) (R>L) 10/31/2015  ? Substance use disorder Risk: LOW 10/31/2015  ? 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 Chronic lower extremity pain (Location of Secondary source of pain) (Bilateral) (R>L) 10/31/2015  ? History of carpal tunnel release of both wrists 10/31/2015  ? Grade I anterolisthesis of C7 on T1 10/31/2015  ? Toe ulcer ([place]) 06/24/2015  ? Cervical spinal cord compression ([place]) 02/07/2015  ? MI (mitral incompetence) 11/11/2014  ? Cardiac murmur 11/09/2014  ? History of decompression of median nerve 05/19/2014  ? HLD (hyperlipidemia) 10/20/2012  ? Benign hypertension 10/20/2012  ? ED (erectile dysfunction) of organic origin 10/20/2012  ? Chronic hepatitis C virus infection ([place]) 10/02/2011  ? Adiposity 10/02/2011  ? Current smoker 10/02/2011  ? ? ?REFERRING DIAG: S/p L TKA ? ?THERAPY DIAG:  ?S/P total knee replacement, left ? ?Joint stiffness of knee, left ? ?Muscle weakness (generalized) ? ?Gait difficulty ? ?Acute pain of left knee ? ?PERTINENT HISTORY:  ? ?PRECAUTIONS: Fall ? ?SUBJECTIVE:  ?04/09/22: Pt. States he was sick last week with a productive cough.  Pt. Reports he is feeling better and has been doing his exercise/ stretches.  Pt. Reports no L knee pain prior to tx. Session.  Pt. Wearing posture brace to PT tx. Session and PT assisted with tightening brace for improve fit.   ? ?   ?Initial evaluation:  ? ?Pt. is a pleasant 66 y/o male s/p L TKA on 02/14/22.  Pt. entered PT using single Lofstrand crutch on L side (PT explained proper use of crutch on R but pt. states he only feels comfortable on L side).  Pt. reports doing well and c/o 4/10 L knee pain at rest.  No falls but  occasional stumbling and self-correcting balance.  Pt. lives with wife (active) and has no kids.  Pt. is a retired Administrator.  ? 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  Pt. has 6 horses, cattle and pigs on 110 acres.  Pt. wears B lower extremity compression stockings to manage swelling.  Pt. currently taking lasix/tramadol/percocet (every 8 hours).  ? ?PAIN:  ?No L knee pain prior to tx. Session.   ? ?TODAY'S TREATMENT:  ? ?Ther.ex.: ? ?Nustep BUE/LE at L5 and seat position 10 for 10 min. B UE/LE (focus on flexion and then extension seat position 14).  ? ?Walking in clinic/ hallway/ outside with cuing to increase step pattern and heel strike without use of Lofstrand.  Pt. Requires cuing to increase R hip flexion/ step length during L LE stance phase of gait.   ? ?Seated 4# ankle wts.:  marching/ LAQ 20x (mirror feedback)- moderate cuing to correct posture.  Forward/ backwards in //-bars with 4# ankle wts.  Cuing to increase hip/ knee extension.  No increase c/o knee pain.   ?  ?  ?Manual tx.: ?  ?Supine L knee AAROM (flexion/ extension)- 5x with static holds.  STM to L distal quad/hamstring in supine position. L proximal incision scab healing well.  Supine L knee PROM with 20 sec. Static holds (as tolerated)- 5x.   ?  ?Supine patellar mobs (all planes)- OP into L knee extension.  ? ?Reviewed HEP ?  ? ? ?PATIENT EDUCATION: ?Education details: Reviewed HEP ?Person educated: Patient ?Education method: Explanation and Demonstration ?Education comprehension: verbalized understanding and returned demonstration ? ? ?HOME EXERCISE PROGRAM: ?Access Code: [email or web address] ?URL: [email or web address] ?Date: 03/21/2022 ?Prepared by: Dorcas Carrow ?  ?Exercises ?- Standing March with Counter Support  - 1 x daily - 7 x weekly - 2 sets - 10 reps ?- Heel Raises with Counter Support  - 1 x daily - 7 x weekly - 2 sets - 10 reps ?- Mini Squat with Counter Support  - 1 x daily - 7 x weekly - 2 sets - 10  reps ?- Standing Hip Extension with Counter Support  - 1 x daily - 7 x weekly - 2 sets - 10 reps ?- Standing Hip Abduction with Counter Support  - 1 x daily - 7 x weekly - 2 sets - 10 reps ? ? ? ? PT L

## 2022-04-11 ENCOUNTER — Ambulatory Visit: Payer: POS | Admitting: Physical Therapy

## 2022-04-11 ENCOUNTER — Encounter: Payer: Self-pay | Admitting: Physical Therapy

## 2022-04-11 DIAGNOSIS — Z96652 Presence of left artificial knee joint: Secondary | ICD-10-CM | POA: Diagnosis not present

## 2022-04-11 DIAGNOSIS — M25662 Stiffness of left knee, not elsewhere classified: Secondary | ICD-10-CM

## 2022-04-11 DIAGNOSIS — R269 Unspecified abnormalities of gait and mobility: Secondary | ICD-10-CM

## 2022-04-11 DIAGNOSIS — M6281 Muscle weakness (generalized): Secondary | ICD-10-CM

## 2022-04-11 DIAGNOSIS — M25562 Pain in left knee: Secondary | ICD-10-CM

## 2022-04-11 NOTE — Therapy (Signed)
?OUTPATIENT PHYSICAL THERAPY TREATMENT NOTE ? ? ?Patient Name: Willie Krueger ?MRN: [id number] ?DOB:10/26/1956, 66 y.o., male ?Today's Date: 04/12/2022 ? ?PCP: Danae Orleans, MD ?REFERRING PROVIDER: Dorna Leitz, MD ? ? PT End of Session - 04/11/22 1110   ? ? Visit Number 8   ? Number of Visits 13   ? Date for PT Re-Evaluation 04/22/22   ? Authorization - Visit Number 8   ? Authorization - Number of Visits 10   ? PT Start Time 1103   ? PT Stop Time 0998   ? PT Time Calculation (min) 61 min   ? Activity Tolerance Patient tolerated treatment well   ? Behavior During Therapy [facility] for tasks assessed/performed   ? ?  ?  ? ?  ? ? ?Past Medical History:  ?Diagnosis Date  ? Acute postoperative pain 02/09/2017  ? Chronic pain   ? Chronic pain associated with significant psychosocial dysfunction 05/04/2015  ? Last Assessment & Plan:  He is having worsening problems with breakthrough pain. Gabapentin is increased from 800 mg 3 times daily to 1200 mg 3 times daily. Oxycodone is also renewed. He continues to through evaluation at [facility] pain clinic.   ? Diabetes mellitus without complication ([place])   ? Heart murmur   ? History of spinal stenosis 10/31/2015  ? C3-4 with severe central canal stenosis with small amount of myelomalacia present. Moderate to severe canal stenosis is also present at C4-5 and C5-6. Moderate to severe neuroforaminal narrowing between the levels of C2-C7  ? Hyperlipidemia   ? Hypertension   ? Leg weakness 07/07/2014  ? Mitral valve regurgitation   ? Nerve root inflammation 06/26/2014  ? Last Assessment & Plan:  Status is unchanged with ongoing pain and limited mobility. Continue with physical therapy and pain management.   ? Osteoarthritis   ? ?Past Surgical History:  ?Procedure Laterality Date  ? AMPUTATION TOE Left 10/2016  ? partial  ? CARPAL TUNNEL RELEASE    ? FOOT SURGERY Left 09/2016  ? IRRIGATION AND DEBRIDEMENT FOOT Left 10/13/2016  ? Procedure: IRRIGATION AND DEBRIDEMENT FOOT;  Surgeon: Samara Deist,  DPM;  Location: [facility] ORS;  Service: Podiatry;  Laterality: Left;  ? PICC LINE PLACE PERIPHERAL ([place] HX) Left [id number]  ? SPINE SURGERY    ? TOTAL KNEE ARTHROPLASTY Left 02/14/2022  ? Procedure: TOTAL KNEE ARTHROPLASTY;  Surgeon: Dorna Leitz, MD;  Location: [facility] ORS;  Service: Orthopedics;  Laterality: Left;  ? ?Patient Active Problem List  ? Diagnosis Date Noted  ? Osteoarthritis of left knee 02/24/2022  ? Primary osteoarthritis of left knee 02/14/2022  ? Overweight 08/13/2020  ? Osteoarthritis of knees, bilateral 04/13/2020  ? Chronic venous stasis 03/12/2018  ? Degeneration of lumbosacral intervertebral disc 06/01/2017  ? History of spinal surgery 06/01/2017  ? Synovial cyst of lumbar spine 06/01/2017  ? Acute neck pain 06/01/2017  ? Osteoarthritis of shoulders (B) (R>L) 06/01/2017  ? Cervical spondylosis 06/01/2017  ? Osteoarthritis 03/16/2017  ? Enlarged thyroid 02/02/2017  ? Chronic pain syndrome 12/04/2016  ? Allergic drug rash due to anti-infective agent 11/28/2016  ? Allergy to drug 11/21/2016  ? Diabetic foot ulcer with osteomyelitis ([place]) 11/14/2016  ? Diabetic infection of left foot ([place]) 10/13/2016  ? Lumbar spondylosis 06/03/2016  ? Lumbar facet syndrome (Location of Primary Source of Pain) (Bilateral) (R>L) 05/28/2016  ? Abnormal MRI, lumbar spine (05/19/2016) 05/28/2016  ? Neurogenic pain 02/27/2016  ? Diabetic peripheral neuropathy ([place]) 02/27/2016  ? Chronic shoulder pain (Location of Tertiary  source of pain) (Bilateral) (R>L) 02/27/2016  ? Type 2 diabetes mellitus with foot ulcer (CODE) ([place]) 01/23/2016  ? Diabetic osteomyelitis ([place]) 12/05/2015  ? Neuropathy 12/05/2015  ? Obesity 12/05/2015  ? Encounter for chronic pain management 12/03/2015  ? Elevated C-reactive protein (CRP) 12/03/2015  ? Long term current use of opiate analgesic 10/31/2015  ? Long term prescription opiate use 10/31/2015  ? Opiate use (67.5 MME/Day) 10/31/2015  ? Opiate dependence ([place]) 10/31/2015  ? Encounter for therapeutic  drug level monitoring 10/31/2015  ? Chronic neck pain (Right side) 10/31/2015  ? Cervical post-laminectomy syndrome 10/31/2015  ? Chronic low back pain (Location of Primary Source of Pain) (Midline pain) (R>L) 10/31/2015  ? Chronic cervical radicular pain (Bilateral) (R>L) 10/31/2015  ? Chronic lumbar radicular pain (Bilateral) (R>L) 10/31/2015  ? Substance use disorder Risk: LOW 10/31/2015  ? 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 Chronic lower extremity pain (Location of Secondary source of pain) (Bilateral) (R>L) 10/31/2015  ? History of carpal tunnel release of both wrists 10/31/2015  ? Grade I anterolisthesis of C7 on T1 10/31/2015  ? Toe ulcer ([place]) 06/24/2015  ? Cervical spinal cord compression ([place]) 02/07/2015  ? MI (mitral incompetence) 11/11/2014  ? Cardiac murmur 11/09/2014  ? History of decompression of median nerve 05/19/2014  ? HLD (hyperlipidemia) 10/20/2012  ? Benign hypertension 10/20/2012  ? ED (erectile dysfunction) of organic origin 10/20/2012  ? Chronic hepatitis C virus infection ([place]) 10/02/2011  ? Adiposity 10/02/2011  ? Current smoker 10/02/2011  ? ? ?REFERRING DIAG: S/p L TKA ? ?THERAPY DIAG:  ?S/P total knee replacement, left ? ?Joint stiffness of knee, left ? ?Muscle weakness (generalized) ? ?Gait difficulty ? ?Acute pain of left knee ? ?PERTINENT HISTORY:  ? ?PRECAUTIONS: Fall ? ?SUBJECTIVE: 04/11/22 ?Pt. Reports 2/10 L knee discomfort this morning.  Pt. Reports no new complaints and has been staying active.  Pt. States he is only occasionally using Lofstrand crutch.  Pt. Reports no falls or LOB.   ? ?   ?Initial evaluation:  ? ?Pt. is a pleasant 66 y/o male s/p L TKA on 02/14/22.  Pt. entered PT using single Lofstrand crutch on L side (PT explained proper use of crutch on R but pt. states he only feels comfortable on L side).  Pt. reports doing well and c/o 4/10 L knee pain at rest.  No falls but occasional stumbling and self-correcting balance.  Pt. lives with wife (active) and has  no kids.  Pt. is a retired Administrator.  ? 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  Pt. has 6 horses, cattle and pigs on 110 acres.  Pt. wears B lower extremity compression stockings to manage swelling.  Pt. currently taking lasix/tramadol/percocet (every 8 hours).  ? ?PAIN:  ?2/10 L knee pain prior to tx. Session.  Pt. Reports more soreness, than pain.    ? ?TODAY'S TREATMENT:  ? ?Ther.ex.: ? ?Walking in //-bars with high marching/ lunges with short holds 3 laps each with mirror feedback. ? ?Nustep BUE/LE at L5 and seat position 14 for 10 min. B UE/LE (focus on knee extension)- minimal UE assist.   ? ?Stairs: step touches 10x on L and R/ recip. Stairs with handrail assist 8x.   Focus on preventing lateral sway/ improve eccentric quad control with descending stairs.  ? ?Walking in clinic/ hallway/ outside with cuing to increase step pattern and heel strike without use of Lofstrand.  Pt. Requires cuing to increase R hip flexion/ step length  during L LE stance phase of gait.   ? ?Supine marching/ SLR/ heel slides with holds 10x each.  ? ?Reviewed HEP and encouraged pt. To focus on L knee extension stretches ? ?  ?Manual tx.: ?  ?Supine L knee AAROM (flexion/ extension)- 5x with static holds.  STM to L distal quad/hamstring in supine position. L proximal incision scab healing well.  Supine L knee PROM with 20 sec. Static holds (as tolerated)- 5x.   ?  ?Supine patellar mobs (all planes)- OP into L knee extension.  ? ?  ? ? ?PATIENT EDUCATION: ?Education details: Reviewed HEP ?Person educated: Patient ?Education method: Explanation and Demonstration ?Education comprehension: verbalized understanding and returned demonstration ? ? ?HOME EXERCISE PROGRAM: ?Access Code: [email or web address] ?URL: [email or web address] ?Date: 03/21/2022 ?Prepared by: Dorcas Carrow ?  ?Exercises ?- Standing March with Counter Support  - 1 x daily - 7 x weekly - 2 sets - 10 reps ?- Heel Raises with Counter Support  - 1  x daily - 7 x weekly - 2 sets - 10 reps ?- Mini Squat with Counter Support  - 1 x daily - 7 x weekly - 2 sets - 10 reps ?- Standing Hip Extension with Counter Support  - 1 x daily - 7 x weekly - 2 sets

## 2022-04-16 ENCOUNTER — Ambulatory Visit: Payer: POS | Admitting: Physical Therapy

## 2022-04-16 ENCOUNTER — Encounter: Payer: Self-pay | Admitting: Physical Therapy

## 2022-04-16 DIAGNOSIS — Z96652 Presence of left artificial knee joint: Secondary | ICD-10-CM

## 2022-04-16 DIAGNOSIS — M6281 Muscle weakness (generalized): Secondary | ICD-10-CM

## 2022-04-16 DIAGNOSIS — R269 Unspecified abnormalities of gait and mobility: Secondary | ICD-10-CM

## 2022-04-16 DIAGNOSIS — M25562 Pain in left knee: Secondary | ICD-10-CM

## 2022-04-16 DIAGNOSIS — M25662 Stiffness of left knee, not elsewhere classified: Secondary | ICD-10-CM

## 2022-04-16 NOTE — Therapy (Signed)
?OUTPATIENT PHYSICAL THERAPY TREATMENT NOTE ? ? ?Patient Name: Willie Krueger ?MRN: [id number] ?DOB:[birth year], 66 y.o., male ?Today's Date: 04/16/2022 ? ?PCP: Danae Orleans, MD ?REFERRING PROVIDER: Dorna Leitz, MD ? ? PT End of Session - 04/16/22 1117   ? ? Visit Number 9   ? Number of Visits 13   ? Date for PT Re-Evaluation 04/22/22   ? Authorization - Visit Number 9   ? Authorization - Number of Visits 10   ? PT Start Time 1111   ? PT Stop Time 4081   ? PT Time Calculation (min) 53 min   ? Activity Tolerance Patient tolerated treatment well   ? Behavior During Therapy [facility] for tasks assessed/performed   ? ?  ?  ? ?  ? ? ?Past Medical History:  ?Diagnosis Date  ? Acute postoperative pain 02/09/2017  ? Chronic pain   ? Chronic pain associated with significant psychosocial dysfunction 05/04/2015  ? Last Assessment & Plan:  He is having worsening problems with breakthrough pain. Gabapentin is increased from 800 mg 3 times daily to 1200 mg 3 times daily. Oxycodone is also renewed. He continues to through evaluation at [facility] pain clinic.   ? Diabetes mellitus without complication ([place])   ? Heart murmur   ? History of spinal stenosis 10/31/2015  ? C3-4 with severe central canal stenosis with small amount of myelomalacia present. Moderate to severe canal stenosis is also present at C4-5 and C5-6. Moderate to severe neuroforaminal narrowing between the levels of C2-C7  ? Hyperlipidemia   ? Hypertension   ? Leg weakness 07/07/2014  ? Mitral valve regurgitation   ? Nerve root inflammation 06/26/2014  ? Last Assessment & Plan:  Status is unchanged with ongoing pain and limited mobility. Continue with physical therapy and pain management.   ? Osteoarthritis   ? ?Past Surgical History:  ?Procedure Laterality Date  ? AMPUTATION TOE Left 10/2016  ? partial  ? CARPAL TUNNEL RELEASE    ? FOOT SURGERY Left 09/2016  ? IRRIGATION AND DEBRIDEMENT FOOT Left 10/13/2016  ? Procedure: IRRIGATION AND DEBRIDEMENT FOOT;  Surgeon: Samara Deist,  DPM;  Location: [facility] ORS;  Service: Podiatry;  Laterality: Left;  ? PICC LINE PLACE PERIPHERAL ([place] HX) Left 11-20017  ? SPINE SURGERY    ? TOTAL KNEE ARTHROPLASTY Left 02/14/2022  ? Procedure: TOTAL KNEE ARTHROPLASTY;  Surgeon: Dorna Leitz, MD;  Location: [facility] ORS;  Service: Orthopedics;  Laterality: Left;  ? ?Patient Active Problem List  ? Diagnosis Date Noted  ? Osteoarthritis of left knee 02/24/2022  ? Primary osteoarthritis of left knee 02/14/2022  ? Overweight 08/13/2020  ? Osteoarthritis of knees, bilateral 04/13/2020  ? Chronic venous stasis 03/12/2018  ? Degeneration of lumbosacral intervertebral disc 06/01/2017  ? History of spinal surgery 06/01/2017  ? Synovial cyst of lumbar spine 06/01/2017  ? Acute neck pain 06/01/2017  ? Osteoarthritis of shoulders (B) (R>L) 06/01/2017  ? Cervical spondylosis 06/01/2017  ? Osteoarthritis 03/16/2017  ? Enlarged thyroid 02/02/2017  ? Chronic pain syndrome 12/04/2016  ? Allergic drug rash due to anti-infective agent 11/28/2016  ? Allergy to drug 11/21/2016  ? Diabetic foot ulcer with osteomyelitis ([place]) 11/14/2016  ? Diabetic infection of left foot ([place]) 10/13/2016  ? Lumbar spondylosis 06/03/2016  ? Lumbar facet syndrome (Location of Primary Source of Pain) (Bilateral) (R>L) 05/28/2016  ? Abnormal MRI, lumbar spine (05/19/2016) 05/28/2016  ? Neurogenic pain 02/27/2016  ? Diabetic peripheral neuropathy ([place]) 02/27/2016  ? Chronic shoulder pain (Location of Tertiary  source of pain) (Bilateral) (R>L) 02/27/2016  ? Type 2 diabetes mellitus with foot ulcer (CODE) ([place]) 01/23/2016  ? Diabetic osteomyelitis ([place]) 12/05/2015  ? Neuropathy 12/05/2015  ? Obesity 12/05/2015  ? Encounter for chronic pain management 12/03/2015  ? Elevated C-reactive protein (CRP) 12/03/2015  ? Long term current use of opiate analgesic 10/31/2015  ? Long term prescription opiate use 10/31/2015  ? Opiate use (67.5 MME/Day) 10/31/2015  ? Opiate dependence ([place]) 10/31/2015  ? Encounter for therapeutic  drug level monitoring 10/31/2015  ? Chronic neck pain (Right side) 10/31/2015  ? Cervical post-laminectomy syndrome 10/31/2015  ? Chronic low back pain (Location of Primary Source of Pain) (Midline pain) (R>L) 10/31/2015  ? Chronic cervical radicular pain (Bilateral) (R>L) 10/31/2015  ? Chronic lumbar radicular pain (Bilateral) (R>L) 10/31/2015  ? Substance use disorder Risk: LOW 10/31/2015  ? 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 Chronic lower extremity pain (Location of Secondary source of pain) (Bilateral) (R>L) 10/31/2015  ? History of carpal tunnel release of both wrists 10/31/2015  ? Grade I anterolisthesis of C7 on T1 10/31/2015  ? Toe ulcer ([place]) 06/24/2015  ? Cervical spinal cord compression ([place]) 02/07/2015  ? MI (mitral incompetence) 11/11/2014  ? Cardiac murmur 11/09/2014  ? History of decompression of median nerve 05/19/2014  ? HLD (hyperlipidemia) 10/20/2012  ? Benign hypertension 10/20/2012  ? ED (erectile dysfunction) of organic origin 10/20/2012  ? Chronic hepatitis C virus infection ([place]) 10/02/2011  ? Adiposity 10/02/2011  ? Current smoker 10/02/2011  ? ? ?REFERRING DIAG: S/p L TKA ? ?THERAPY DIAG:  ?S/P total knee replacement, left ? ?Joint stiffness of knee, left ? ?Muscle weakness (generalized) ? ?Gait difficulty ? ?Acute pain of left knee ? ?PERTINENT HISTORY:  ? ?PRECAUTIONS: Fall ? ?SUBJECTIVE: 04/16/22 ?Pt. Reports minimal L knee discomfort this morning.  No subjective pain score given.  Pt. Reports no new complaints and has been staying active.  Pt. Planning to cut grass this afternoon after PT tx.   ? ?   ?Initial evaluation:  ? ?Pt. is a pleasant 66 y/o male s/p L TKA on 02/14/22.  Pt. entered PT using single Lofstrand crutch on L side (PT explained proper use of crutch on R but pt. states he only feels comfortable on L side).  Pt. reports doing well and c/o 4/10 L knee pain at rest.  No falls but occasional stumbling and self-correcting balance.  Pt. lives with wife (active) and has  no kids.  Pt. is a retired Administrator.  ? 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  Pt. has 6 horses, cattle and pigs on 110 acres.  Pt. wears B lower extremity compression stockings to manage swelling.  Pt. currently taking lasix/tramadol/percocet (every 8 hours).  ? ?PAIN:  ?No subjective L knee pain score given prior to tx. Session.  Pt. Reports more soreness, than pain.    ? ?TODAY'S TREATMENT:  04/16/22 ? ?Ther.ex.: ? ?Nustep BLE only at L6 and seat position 13-14 for 10 min. B UE/LE (focus on knee extension). Slight fatigue noted.   ? ?Walking in //-bars with high marching/ lateral walking 3 laps each with mirror feedback.  Light to no UE assist.   ? ?Resisted gait 2BTB 5x all 4-planes (light UE assist for safety with SBA).    ? ?Walking in clinic/ hallway/ outside with cuing to increase step pattern and heel strike without use of Lofstrand.  Pt. Requires cuing to increase R hip flexion/ step length during L  LE stance phase of gait.  ? ?Stairs: step touches 10x on L and R/ recip. Stairs with handrail assist 10x.   Focus on preventing lateral sway/ improve eccentric quad control with descending stairs.  Light UE assist for safety.  Unable to complete without UE assist.   ? ?Supine marching/ SLR/ clamshells with holds 10x each.  ? ?  ?Manual tx.: ?  ?Supine L knee AAROM (flexion/ extension)- 5x with static holds.   Supine L knee PROM with 20 sec. Static holds (as tolerated)- 5x.  Focus on knee extension (-2 deg. AAROM after manual stretches).   ?  ?Supine patellar mobs (all planes)- OP into L knee extension.  ? ?  ? ? ?PATIENT EDUCATION: ?Education details: Reviewed HEP ?Person educated: Patient ?Education method: Explanation and Demonstration ?Education comprehension: verbalized understanding and returned demonstration ? ? ?HOME EXERCISE PROGRAM: ?Access Code: [email or web address] ?URL: [email or web address] ?Date: 03/21/2022 ?Prepared by: Dorcas Carrow ?  ?Exercises ?- Standing March  with Counter Support  - 1 x daily - 7 x weekly - 2 sets - 10 reps ?- Heel Raises with Counter Support  - 1 x daily - 7 x weekly - 2 sets - 10 reps ?- Mini Squat with Counter Support  - 1 x daily - 7 x we

## 2022-04-18 ENCOUNTER — Encounter: Payer: Self-pay | Admitting: Physical Therapy

## 2022-04-18 ENCOUNTER — Ambulatory Visit: Payer: POS | Admitting: Physical Therapy

## 2022-04-18 DIAGNOSIS — Z96652 Presence of left artificial knee joint: Secondary | ICD-10-CM | POA: Diagnosis not present

## 2022-04-18 DIAGNOSIS — R269 Unspecified abnormalities of gait and mobility: Secondary | ICD-10-CM

## 2022-04-18 DIAGNOSIS — M25662 Stiffness of left knee, not elsewhere classified: Secondary | ICD-10-CM

## 2022-04-18 DIAGNOSIS — M25562 Pain in left knee: Secondary | ICD-10-CM

## 2022-04-18 DIAGNOSIS — M6281 Muscle weakness (generalized): Secondary | ICD-10-CM

## 2022-04-18 NOTE — Therapy (Addendum)
?OUTPATIENT PHYSICAL THERAPY TREATMENT NOTE ?Physical Therapy Progress Note ? ? ?Dates of reporting period  03/11/22  to  04/18/22  ? ?Patient Name: Willie Krueger ?MRN: [id number] ?DOB:[birth year], 66 y.o., male ?Today's Date: 04/18/2022 ? ?PCP: Danae Orleans, MD ?REFERRING PROVIDER: Dorna Leitz, MD ? ? PT End of Session - 04/18/22 1136   ? ? Visit Number 10   ? Number of Visits 18   ? Date for PT Re-Evaluation 05/16/22   ? Authorization - Visit Number 10   ? Authorization - Number of Visits 10   ? PT Start Time 1105   ? PT Stop Time 1208   ? PT Time Calculation (min) 63 min   ? Activity Tolerance Patient tolerated treatment well   ? Behavior During Therapy [facility] for tasks assessed/performed   ? ?  ?  ? ?  ? ? ?Past Medical History:  ?Diagnosis Date  ? Acute postoperative pain 02/09/2017  ? Chronic pain   ? Chronic pain associated with significant psychosocial dysfunction 05/04/2015  ? Last Assessment & Plan:  He is having worsening problems with breakthrough pain. Gabapentin is increased from 800 mg 3 times daily to 1200 mg 3 times daily. Oxycodone is also renewed. He continues to through evaluation at [facility] pain clinic.   ? Diabetes mellitus without complication ([place])   ? Heart murmur   ? History of spinal stenosis 10/31/2015  ? C3-4 with severe central canal stenosis with small amount of myelomalacia present. Moderate to severe canal stenosis is also present at C4-5 and C5-6. Moderate to severe neuroforaminal narrowing between the levels of C2-C7  ? Hyperlipidemia   ? Hypertension   ? Leg weakness 07/07/2014  ? Mitral valve regurgitation   ? Nerve root inflammation 06/26/2014  ? Last Assessment & Plan:  Status is unchanged with ongoing pain and limited mobility. Continue with physical therapy and pain management.   ? Osteoarthritis   ? ?Past Surgical History:  ?Procedure Laterality Date  ? AMPUTATION TOE Left 10/2016  ? partial  ? CARPAL TUNNEL RELEASE    ? FOOT SURGERY Left 09/2016  ? IRRIGATION AND DEBRIDEMENT FOOT  Left 10/13/2016  ? Procedure: IRRIGATION AND DEBRIDEMENT FOOT;  Surgeon: Samara Deist, DPM;  Location: [facility] ORS;  Service: Podiatry;  Laterality: Left;  ? PICC LINE PLACE PERIPHERAL ([place] HX) Left [id number]  ? SPINE SURGERY    ? TOTAL KNEE ARTHROPLASTY Left 02/14/2022  ? Procedure: TOTAL KNEE ARTHROPLASTY;  Surgeon: Dorna Leitz, MD;  Location: [facility]S;  Service: Orthopedics;  Laterality: Left;  ? ?Patient Active Problem List  ? Diagnosis Date Noted  ? Osteoarthritis of left knee 02/24/2022  ? Primary osteoarthritis of left knee 02/14/2022  ? Overweight 08/13/2020  ? Osteoarthritis of knees, bilateral 04/13/2020  ? Chronic venous stasis 03/12/2018  ? Degeneration of lumbosacral intervertebral disc 06/01/2017  ? History of spinal surgery 06/01/2017  ? Synovial cyst of lumbar spine 06/01/2017  ? Acute neck pain 06/01/2017  ? Osteoarthritis of shoulders (B) (R>L) 06/01/2017  ? Cervical spondylosis 06/01/2017  ? Osteoarthritis 03/16/2017  ? Enlarged thyroid 02/02/2017  ? Chronic pain syndrome 12/04/2016  ? Allergic drug rash due to anti-infective agent 11/28/2016  ? Allergy to drug 11/21/2016  ? Diabetic foot ulcer with osteomyelitis ([place]) 11/14/2016  ? Diabetic infection of left foot ([place]) 10/13/2016  ? Lumbar spondylosis 06/03/2016  ? Lumbar facet syndrome (Location of Primary Source of Pain) (Bilateral) (R>L) 05/28/2016  ? Abnormal MRI, lumbar spine (05/19/2016) 05/28/2016  ? Neurogenic pain  02/27/2016  ? Diabetic peripheral neuropathy ([place]) 02/27/2016  ? Chronic shoulder pain (Location of Tertiary source of pain) (Bilateral) (R>L) 02/27/2016  ? Type 2 diabetes mellitus with foot ulcer (CODE) ([place]) 01/23/2016  ? Diabetic osteomyelitis ([place]) 12/05/2015  ? Neuropathy 12/05/2015  ? Obesity 12/05/2015  ? Encounter for chronic pain management 12/03/2015  ? Elevated C-reactive protein (CRP) 12/03/2015  ? Long term current use of opiate analgesic 10/31/2015  ? Long term prescription opiate use 10/31/2015  ? Opiate use (67.5  MME/Day) 10/31/2015  ? Opiate dependence ([place]) 10/31/2015  ? Encounter for therapeutic drug level monitoring 10/31/2015  ? Chronic neck pain (Right side) 10/31/2015  ? Cervical post-laminectomy syndrome 10/31/2015  ? Chronic low back pain (Location of Primary Source of Pain) (Midline pain) (R>L) 10/31/2015  ? Chronic cervical radicular pain (Bilateral) (R>L) 10/31/2015  ? Chronic lumbar radicular pain (Bilateral) (R>L) 10/31/2015  ? Substance use disorder Risk: LOW 10/31/2015  ? 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 Chronic lower extremity pain (Location of Secondary source of pain) (Bilateral) (R>L) 10/31/2015  ? History of carpal tunnel release of both wrists 10/31/2015  ? Grade I anterolisthesis of C7 on T1 10/31/2015  ? Toe ulcer ([place]) 06/24/2015  ? Cervical spinal cord compression ([place]) 02/07/2015  ? MI (mitral incompetence) 11/11/2014  ? Cardiac murmur 11/09/2014  ? History of decompression of median nerve 05/19/2014  ? HLD (hyperlipidemia) 10/20/2012  ? Benign hypertension 10/20/2012  ? ED (erectile dysfunction) of organic origin 10/20/2012  ? Chronic hepatitis C virus infection ([place]) 10/02/2011  ? Adiposity 10/02/2011  ? Current smoker 10/02/2011  ? ? ?REFERRING DIAG: S/p L TKA ? ?THERAPY DIAG:  ?S/P total knee replacement, left ? ?Joint stiffness of knee, left ? ?Muscle weakness (generalized) ? ?Gait difficulty ? ?Acute pain of left knee ? ?PERTINENT HISTORY:  ? ?PRECAUTIONS: Fall ? ?SUBJECTIVE: 04/18/22 ?Pt. Reports 1-2/10 L knee discomfort prior to tx. Session.  Pt. Entered PT while carrying Lofstrand crutch.  Pt. States he has been busy in greenhouse and mowing yard.   ? ?   ?Initial evaluation:  ? ?Pt. is a pleasant 66 y/o male s/p L TKA on 02/14/22.  Pt. entered PT using single Lofstrand crutch on L side (PT explained proper use of crutch on R but pt. states he only feels comfortable on L side).  Pt. reports doing well and c/o 4/10 L knee pain at rest.  No falls but occasional stumbling and  self-correcting balance.  Pt. lives with wife (active) and has no kids.  Pt. is a retired Administrator.  ? 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  Pt. has 6 horses, cattle and pigs on 110 acres.  Pt. wears B lower extremity compression stockings to manage swelling.  Pt. currently taking lasix/tramadol/percocet (every 8 hours).  ? ?PAIN:  ?1-2/10 L knee discomfort (more soreness, than pain).     ? ?TODAY'S TREATMENT:  04/18/22 ? ?Ther.ex.: ? ?Nustep BLE only at L4 and seat position 13-14 for 10 min. B UE/LE (focus on knee extension). Slight fatigue noted.   ? ?  Seated LAQ/ marching/ heel raises 20x each. ? ?  Sit to stands from low blue mat table 10x with no UE assist. ? ?Supine marching/ SLR/ clamshells with holds 10x each.  ? ? ?Neuro: ? ?Walking in clinic/ hallway/ outside with cuing to increase step pattern and heel strike without use of Lofstrand.  Pt. Requires cuing to increase R hip flexion/ step length during L  LE stance phase of gait.  SBA for safety/ cuing, esp. During outside walking on grassy terrain.   ? ?Outside stairs: reciprocal pattern with use of R handrail for safety/ control.  Focus on preventing lateral sway/ improve eccentric quad control with descending stairs.  Unable to complete without UE assist.   ?  ? ?Manual tx.: ?  ?Supine L knee AAROM (flexion/ extension)- 5x with static holds.   Supine L knee PROM with 20 sec. Static holds (as tolerated)- 5x.  Focus on knee extension (-2 to 118 deg. AAROM after manual stretches).   ?  ?Supine patellar mobs (all planes)- OP into L knee extension.  ? ?  ? ? ?PATIENT EDUCATION: ?Education details: Reviewed HEP ?Person educated: Patient ?Education method: Explanation and Demonstration ?Education comprehension: verbalized understanding and returned demonstration ? ? ?HOME EXERCISE PROGRAM: ?Access Code: [email or web address] ?URL: [email or web address] ?Date: 03/21/2022 ?Prepared by: Dorcas Carrow ?  ?Exercises ?- Standing March with  Counter Support  - 1 x daily - 7 x weekly - 2 sets - 10 reps ?- Heel Raises with Counter Support  - 1 x daily - 7 x weekly - 2 sets - 10 reps ?- Mini Squat with Counter Support  - 1 x daily - 7 x weekly - 2 se

## 2022-04-29 ENCOUNTER — Ambulatory Visit: Payer: POS | Attending: Orthopedic Surgery | Admitting: Physical Therapy

## 2022-05-01 ENCOUNTER — Ambulatory Visit: Payer: POS | Admitting: Physical Therapy

## 2022-05-06 ENCOUNTER — Ambulatory Visit: Payer: POS | Admitting: Physical Therapy

## 2022-05-08 ENCOUNTER — Ambulatory Visit: Payer: POS | Admitting: Physical Therapy

## 2022-05-09 ENCOUNTER — Ambulatory Visit: Payer: POS | Admitting: Podiatry

## 2022-05-13 ENCOUNTER — Other Ambulatory Visit (HOSPITAL_COMMUNITY): Payer: Self-pay

## 2022-05-13 ENCOUNTER — Encounter: Payer: POS | Admitting: Physical Therapy

## 2022-05-15 ENCOUNTER — Encounter: Payer: POS | Admitting: Physical Therapy

## 2022-05-20 ENCOUNTER — Ambulatory Visit (INDEPENDENT_AMBULATORY_CARE_PROVIDER_SITE_OTHER): Payer: Medicare Other | Admitting: Podiatry

## 2022-05-20 ENCOUNTER — Encounter: Payer: Self-pay | Admitting: Podiatry

## 2022-05-20 ENCOUNTER — Encounter: Payer: POS | Admitting: Physical Therapy

## 2022-05-20 DIAGNOSIS — B351 Tinea unguium: Secondary | ICD-10-CM

## 2022-05-20 DIAGNOSIS — M79674 Pain in right toe(s): Secondary | ICD-10-CM | POA: Diagnosis not present

## 2022-05-20 DIAGNOSIS — M79675 Pain in left toe(s): Secondary | ICD-10-CM | POA: Diagnosis not present

## 2022-05-20 NOTE — Progress Notes (Signed)
   SUBJECTIVE Patient with a history of diabetes mellitus presents to office today complaining of elongated, thickened nails that cause pain while ambulating in shoes.  Patient is unable to trim their own nails.  Patient has a history of left hallux partial amputation which healed uneventfully several years prior.  Patient is here for further evaluation and treatment.   Past Medical History:  Diagnosis Date   Acute postoperative pain 02/09/2017   Chronic pain    Chronic pain associated with significant psychosocial dysfunction 05/04/2015   Last Assessment & Plan:  He is having worsening problems with breakthrough pain. Gabapentin is increased from 800 mg 3 times daily to 1200 mg 3 times daily. Oxycodone is also renewed. He continues to through evaluation at [facility] pain clinic.    Diabetes mellitus without complication (HCC)    Heart murmur    History of spinal stenosis 10/31/2015   C3-4 with severe central canal stenosis with small amount of myelomalacia present. Moderate to severe canal stenosis is also present at C4-5 and C5-6. Moderate to severe neuroforaminal narrowing between the levels of C2-C7   Hyperlipidemia    Hypertension    Leg weakness 07/07/2014   Mitral valve regurgitation    Nerve root inflammation 06/26/2014   Last Assessment & Plan:  Status is unchanged with ongoing pain and limited mobility. Continue with physical therapy and pain management.    Osteoarthritis     OBJECTIVE General Patient is awake, alert, and oriented x 3 and in no acute distress. Derm Skin is dry and supple bilateral. Negative open lesions or macerations. Remaining integument unremarkable. Nails are tender, long, thickened and dystrophic with subungual debris, consistent with onychomycosis, 1-5 bilateral. No signs of infection noted.   Vasc  DP and PT pedal pulses palpable bilaterally. Temperature gradient within normal limits.  Neuro Epicritic and protective threshold sensation diminished bilaterally.   Musculoskeletal Exam history of left partial hallux amputation with routine healing.  ASSESSMENT 1. Diabetes Mellitus w/ peripheral neuropathy 2.  Pain due to onychomycosis of toenails bilateral  PLAN OF CARE 1. Patient evaluated today. 2. Instructed to maintain good pedal hygiene and foot care. Stressed importance of controlling blood sugar.  3. Mechanical debridement of nails 1-5 bilaterally performed using a nail nipper. Filed with dremel without incident.  4. Return to clinic in 3 mos.     Edrick Kins, DPM [facility]  Dr. Edrick Kins, DPM    [address], [place] [address]                Office [phone]  Fax [phone]

## 2022-05-22 ENCOUNTER — Encounter: Payer: POS | Admitting: Physical Therapy

## 2022-08-22 ENCOUNTER — Ambulatory Visit (INDEPENDENT_AMBULATORY_CARE_PROVIDER_SITE_OTHER): Payer: POS | Admitting: Podiatry

## 2022-08-22 DIAGNOSIS — M79675 Pain in left toe(s): Secondary | ICD-10-CM | POA: Diagnosis not present

## 2022-08-22 DIAGNOSIS — B351 Tinea unguium: Secondary | ICD-10-CM | POA: Diagnosis not present

## 2022-08-22 DIAGNOSIS — M79674 Pain in right toe(s): Secondary | ICD-10-CM

## 2022-08-22 NOTE — Progress Notes (Signed)
   Chief Complaint  Patient presents with   foot care    Diabetic foot care.    SUBJECTIVE Patient with a history of diabetes mellitus presents to office today complaining of elongated, thickened nails that cause pain while ambulating in shoes.  Patient is unable to trim their own nails.  Patient has a history of left hallux partial amputation which healed uneventfully several years prior.  Patient is here for further evaluation and treatment.   Past Medical History:  Diagnosis Date   Acute postoperative pain 02/09/2017   Chronic pain    Chronic pain associated with significant psychosocial dysfunction 05/04/2015   Last Assessment & Plan:  He is having worsening problems with breakthrough pain. Gabapentin is increased from 800 mg 3 times daily to 1200 mg 3 times daily. Oxycodone is also renewed. He continues to through evaluation at [facility] pain clinic.    Diabetes mellitus without complication (HCC)    Heart murmur    History of spinal stenosis 10/31/2015   C3-4 with severe central canal stenosis with small amount of myelomalacia present. Moderate to severe canal stenosis is also present at C4-5 and C5-6. Moderate to severe neuroforaminal narrowing between the levels of C2-C7   Hyperlipidemia    Hypertension    Leg weakness 07/07/2014   Mitral valve regurgitation    Nerve root inflammation 06/26/2014   Last Assessment & Plan:  Status is unchanged with ongoing pain and limited mobility. Continue with physical therapy and pain management.    Osteoarthritis     OBJECTIVE General Patient is awake, alert, and oriented x 3 and in no acute distress. Derm Skin is dry and supple bilateral. Negative open lesions or macerations. Remaining integument unremarkable. Nails are tender, long, thickened and dystrophic with subungual debris, consistent with onychomycosis, 1-5 bilateral. No signs of infection noted.   Vasc  DP and PT pedal pulses palpable bilaterally. Temperature gradient within normal  limits.  Neuro Epicritic and protective threshold sensation diminished bilaterally.  Musculoskeletal Exam history of left partial hallux amputation with routine healing.  ASSESSMENT 1. Diabetes Mellitus w/ peripheral neuropathy 2.  Pain due to onychomycosis of toenails bilateral  PLAN OF CARE 1. Patient evaluated today. 2. Instructed to maintain good pedal hygiene and foot care. Stressed importance of controlling blood sugar.  3. Mechanical debridement of nails 1-5 bilaterally performed using a nail nipper. Filed with dremel without incident.  4. Return to clinic in 3 mos.     Edrick Kins, DPM [facility]  Dr. Edrick Kins, DPM    [address],  [address]                Office [phone]  Fax [phone]

## 2022-08-29 DEATH — deceased

## 2022-11-25 ENCOUNTER — Ambulatory Visit (INDEPENDENT_AMBULATORY_CARE_PROVIDER_SITE_OTHER): Payer: POS

## 2022-11-25 ENCOUNTER — Ambulatory Visit (INDEPENDENT_AMBULATORY_CARE_PROVIDER_SITE_OTHER): Payer: POS | Admitting: Podiatry

## 2022-11-25 DIAGNOSIS — L97522 Non-pressure chronic ulcer of other part of left foot with fat layer exposed: Secondary | ICD-10-CM

## 2022-11-25 DIAGNOSIS — E0843 Diabetes mellitus due to underlying condition with diabetic autonomic (poly)neuropathy: Secondary | ICD-10-CM | POA: Diagnosis not present

## 2022-11-25 DIAGNOSIS — E119 Type 2 diabetes mellitus without complications: Secondary | ICD-10-CM | POA: Diagnosis not present

## 2022-11-25 MED ORDER — DOXYCYCLINE HYCLATE 100 MG PO TABS: 100.0000 mg | ORAL_TABLET | Freq: Two times a day (BID) | ORAL | 0 refills | Status: DC

## 2022-11-25 NOTE — Progress Notes (Signed)
Chief Complaint  Patient presents with   foot care    Patient is here for diabetic foot care.    Subjective:  66 y.o. male with PMHx of diabetes mellitus presenting today for evaluation of new development of an ulcer to the left second toe.  Patient states that the ulcer has been present for a few months now.  Denies a history of injury.  Gradual onset.  He is diabetic and has a history of prior amputations of the left great toe.   Past Medical History:  Diagnosis Date   Acute postoperative pain 02/09/2017   Chronic pain    Chronic pain associated with significant psychosocial dysfunction 05/04/2015   Last Assessment & Plan:  He is having worsening problems with breakthrough pain. Gabapentin is increased from 800 mg 3 times daily to 1200 mg 3 times daily. Oxycodone is also renewed. He continues to through evaluation at [facility] pain clinic.    Diabetes mellitus without complication (HCC)    Heart murmur    History of spinal stenosis 10/31/2015   C3-4 with severe central canal stenosis with small amount of myelomalacia present. Moderate to severe canal stenosis is also present at C4-5 and C5-6. Moderate to severe neuroforaminal narrowing between the levels of C2-C7   Hyperlipidemia    Hypertension    Leg weakness 07/07/2014   Mitral valve regurgitation    Nerve root inflammation 06/26/2014   Last Assessment & Plan:  Status is unchanged with ongoing pain and limited mobility. Continue with physical therapy and pain management.    Osteoarthritis     Past Surgical History:  Procedure Laterality Date   AMPUTATION TOE Left 10/2016   partial   CARPAL TUNNEL RELEASE     FOOT SURGERY Left 09/2016   IRRIGATION AND DEBRIDEMENT FOOT Left 10/13/2016   Procedure: IRRIGATION AND DEBRIDEMENT FOOT;  Surgeon: Samara Deist, DPM;  Location: [facility] ORS;  Service: Podiatry;  Laterality: Left;   PICC LINE PLACE PERIPHERAL ([place] HX) Left [id number]   SPINE SURGERY     TOTAL KNEE ARTHROPLASTY Left 02/14/2022    Procedure: TOTAL KNEE ARTHROPLASTY;  Surgeon: Dorna Leitz, MD;  Location: [facility] ORS;  Service: Orthopedics;  Laterality: Left;    Allergies  Allergen Reactions   Piperacillin-Tazobactam In Dex Rash   Cleocin [Clindamycin] Rash   Clindamycin Phosphate Rash   Sulfa Antibiotics Rash   Zosyn [Piperacillin Sod-Tazobactam So] Rash     Objective/Physical Exam General: The patient is alert and oriented x3 in no acute distress.  Dermatology:  Wound #1 noted to the distal tip of the left second toe measuring approximately 1.0 x 1.0 x 0.2 cm (LxWxD).   To the noted ulceration(s), there is no eschar. There is a moderate amount of slough, fibrin, and necrotic tissue noted. Granulation tissue and wound base is red. There is a minimal amount of serosanguineous drainage noted. There is no exposed bone muscle-tendon ligament or joint. There is no malodor. Periwound integrity is intact. Skin is warm, dry and supple bilateral lower extremities.  Vascular: Palpable pedal pulses bilaterally.  Clinically no concern for vascular compromise.  Skin is warm to touch.  There is some edema with erythema localized around the second digit of the left foot  Neurological: Light touch and protective threshold diminished bilaterally.   Musculoskeletal Exam: Prior amputation left great toe.  Radiographic exam LT foot 11/25/2022: AP view the distal tip of the second toe is out of view.  Oblique and lateral views does not demonstrate any obvious  sign of cortical erosion or cortical irregularities that would be consistent with osteomyelitis. Prior amputation of the great toe at the diaphysis of the proximal phalanx.  Diffuse degenerative changes also noted throughout the pedal joints of the foot.  Lateral view does demonstrate arch collapse throughout the midtarsal joints.  Assessment: 1.  Ulcer left second toe secondary to diabetes mellitus 2. diabetes mellitus w/ peripheral neuropathy 3.  History of prior amputation  left great toe   Plan of Care:  1. Patient was evaluated. 2. medically necessary excisional debridement including subcutaneous tissue was performed using a tissue nipper and a chisel blade. Excisional debridement of all the necrotic nonviable tissue down to healthy bleeding viable tissue was performed with post-debridement measurements same as pre-. 3. the wound was cleansed and dry sterile dressing applied. 4.  Cultures taken and sent to pathology for culture and sensitivity 5.  Prescription for doxycycline 100 mg 2 times daily #20 6.  Betadine ointment provided for the patient to apply daily with a light dressing 7.  Postsurgical shoe dispensed.  WBAT 8.  Return to clinic 3 weeks   Edrick Kins, DPM [facility]  Dr. Edrick Kins, DPM    [address], [place] [address]                Office [phone]  Fax [phone]

## 2022-12-16 ENCOUNTER — Ambulatory Visit: Payer: Medicare Other | Admitting: Podiatry

## 2023-01-06 ENCOUNTER — Ambulatory Visit (INDEPENDENT_AMBULATORY_CARE_PROVIDER_SITE_OTHER): Payer: POS | Admitting: Podiatry

## 2023-01-06 VITALS — BP 139/75

## 2023-01-06 DIAGNOSIS — M79674 Pain in right toe(s): Secondary | ICD-10-CM

## 2023-01-06 DIAGNOSIS — M79675 Pain in left toe(s): Secondary | ICD-10-CM | POA: Diagnosis not present

## 2023-01-06 DIAGNOSIS — B351 Tinea unguium: Secondary | ICD-10-CM

## 2023-01-06 NOTE — Progress Notes (Signed)
   Chief Complaint  Patient presents with   Diabetic Ulcer    Left foot 2nd toe ulcer and nail trim, patient denies any pain, A1c -6.1    SUBJECTIVE Patient with a history of diabetes mellitus presents to office today complaining of elongated, thickened nails that cause pain while ambulating in shoes.  Patient is unable to trim their own nails. Patient is here for further evaluation and treatment.  Past Medical History:  Diagnosis Date   Acute postoperative pain 02/09/2017   Chronic pain    Chronic pain associated with significant psychosocial dysfunction 05/04/2015   Last Assessment & Plan:  He is having worsening problems with breakthrough pain. Gabapentin is increased from 800 mg 3 times daily to 1200 mg 3 times daily. Oxycodone is also renewed. He continues to through evaluation at [facility] pain clinic.    Diabetes mellitus without complication (HCC)    Heart murmur    History of spinal stenosis 10/31/2015   C3-4 with severe central canal stenosis with small amount of myelomalacia present. Moderate to severe canal stenosis is also present at C4-5 and C5-6. Moderate to severe neuroforaminal narrowing between the levels of C2-C7   Hyperlipidemia    Hypertension    Leg weakness 07/07/2014   Mitral valve regurgitation    Nerve root inflammation 06/26/2014   Last Assessment & Plan:  Status is unchanged with ongoing pain and limited mobility. Continue with physical therapy and pain management.    Osteoarthritis     Allergies  Allergen Reactions   Piperacillin-Tazobactam In Dex Rash   Cleocin [Clindamycin] Rash   Clindamycin Phosphate Rash   Sulfa Antibiotics Rash   Zosyn [Piperacillin Sod-Tazobactam So] Rash     OBJECTIVE General Patient is awake, alert, and oriented x 3 and in no acute distress. Derm Skin is dry and supple bilateral. Negative open lesions or macerations. Remaining integument unremarkable. Nails are tender, long, thickened and dystrophic with subungual debris,  consistent with onychomycosis, 1-5 bilateral. No signs of infection noted. Vasc  DP and PT pedal pulses palpable bilaterally. Temperature gradient within normal limits.  Neuro Epicritic and protective threshold sensation diminished bilaterally.  Musculoskeletal Exam No symptomatic pedal deformities noted bilateral. Muscular strength within normal limits.  ASSESSMENT 1. Diabetes Mellitus w/ peripheral neuropathy 2.  Pain due to onychomycosis of toenails bilateral  PLAN OF CARE 1. Patient evaluated today. 2. Instructed to maintain good pedal hygiene and foot care. Stressed importance of controlling blood sugar.  3. Mechanical debridement of nails 1-5 bilaterally performed using a nail nipper. Filed with dremel without incident.  4. Return to clinic in 3 mos.     Edrick Kins, DPM [facility]  Dr. Edrick Kins, DPM    [address], [place] [address]                Office [phone]  Fax [phone]

## 2023-02-13 ENCOUNTER — Other Ambulatory Visit: Payer: Self-pay

## 2023-02-13 ENCOUNTER — Inpatient Hospital Stay
Admission: EM | Admit: 2023-02-13 | Discharge: 2023-02-17 | DRG: 291 | Discharge status: Against Medical Advice | Payer: 59 | Attending: Hospitalist | Admitting: Hospitalist

## 2023-02-13 ENCOUNTER — Emergency Department: Payer: 59

## 2023-02-13 DIAGNOSIS — F17210 Nicotine dependence, cigarettes, uncomplicated: Secondary | ICD-10-CM | POA: Diagnosis present

## 2023-02-13 DIAGNOSIS — Z86718 Personal history of other venous thrombosis and embolism: Secondary | ICD-10-CM

## 2023-02-13 DIAGNOSIS — Z96652 Presence of left artificial knee joint: Secondary | ICD-10-CM | POA: Diagnosis present

## 2023-02-13 DIAGNOSIS — I5031 Acute diastolic (congestive) heart failure: Secondary | ICD-10-CM | POA: Diagnosis present

## 2023-02-13 DIAGNOSIS — Z882 Allergy status to sulfonamides status: Secondary | ICD-10-CM | POA: Diagnosis not present

## 2023-02-13 DIAGNOSIS — Z881 Allergy status to other antibiotic agents status: Secondary | ICD-10-CM | POA: Diagnosis not present

## 2023-02-13 DIAGNOSIS — G9341 Metabolic encephalopathy: Secondary | ICD-10-CM | POA: Diagnosis present

## 2023-02-13 DIAGNOSIS — Z8249 Family history of ischemic heart disease and other diseases of the circulatory system: Secondary | ICD-10-CM

## 2023-02-13 DIAGNOSIS — R55 Syncope and collapse: Secondary | ICD-10-CM | POA: Diagnosis present

## 2023-02-13 DIAGNOSIS — Z888 Allergy status to other drugs, medicaments and biological substances status: Secondary | ICD-10-CM

## 2023-02-13 DIAGNOSIS — J810 Acute pulmonary edema: Secondary | ICD-10-CM

## 2023-02-13 DIAGNOSIS — Z833 Family history of diabetes mellitus: Secondary | ICD-10-CM

## 2023-02-13 DIAGNOSIS — I2510 Atherosclerotic heart disease of native coronary artery without angina pectoris: Secondary | ICD-10-CM | POA: Diagnosis present

## 2023-02-13 DIAGNOSIS — Z6831 Body mass index (BMI) 31.0-31.9, adult: Secondary | ICD-10-CM

## 2023-02-13 DIAGNOSIS — E669 Obesity, unspecified: Secondary | ICD-10-CM | POA: Diagnosis present

## 2023-02-13 DIAGNOSIS — Z7984 Long term (current) use of oral hypoglycemic drugs: Secondary | ICD-10-CM

## 2023-02-13 DIAGNOSIS — I110 Hypertensive heart disease with heart failure: Principal | ICD-10-CM | POA: Diagnosis present

## 2023-02-13 DIAGNOSIS — J9601 Acute respiratory failure with hypoxia: Secondary | ICD-10-CM | POA: Diagnosis present

## 2023-02-13 DIAGNOSIS — J189 Pneumonia, unspecified organism: Secondary | ICD-10-CM | POA: Insufficient documentation

## 2023-02-13 DIAGNOSIS — M4802 Spinal stenosis, cervical region: Secondary | ICD-10-CM | POA: Diagnosis present

## 2023-02-13 DIAGNOSIS — E785 Hyperlipidemia, unspecified: Secondary | ICD-10-CM | POA: Diagnosis present

## 2023-02-13 DIAGNOSIS — E1151 Type 2 diabetes mellitus with diabetic peripheral angiopathy without gangrene: Secondary | ICD-10-CM | POA: Diagnosis present

## 2023-02-13 DIAGNOSIS — Z7982 Long term (current) use of aspirin: Secondary | ICD-10-CM

## 2023-02-13 DIAGNOSIS — Z1152 Encounter for screening for COVID-19: Secondary | ICD-10-CM

## 2023-02-13 DIAGNOSIS — Z79891 Long term (current) use of opiate analgesic: Secondary | ICD-10-CM

## 2023-02-13 DIAGNOSIS — R8271 Bacteriuria: Secondary | ICD-10-CM | POA: Diagnosis present

## 2023-02-13 DIAGNOSIS — G894 Chronic pain syndrome: Secondary | ICD-10-CM | POA: Diagnosis present

## 2023-02-13 DIAGNOSIS — R8290 Unspecified abnormal findings in urine: Secondary | ICD-10-CM | POA: Insufficient documentation

## 2023-02-13 DIAGNOSIS — E1165 Type 2 diabetes mellitus with hyperglycemia: Secondary | ICD-10-CM

## 2023-02-13 DIAGNOSIS — Z79899 Other long term (current) drug therapy: Secondary | ICD-10-CM

## 2023-02-13 DIAGNOSIS — I10 Essential (primary) hypertension: Secondary | ICD-10-CM | POA: Diagnosis present

## 2023-02-13 DIAGNOSIS — I509 Heart failure, unspecified: Secondary | ICD-10-CM

## 2023-02-13 DIAGNOSIS — J9602 Acute respiratory failure with hypercapnia: Secondary | ICD-10-CM | POA: Diagnosis present

## 2023-02-13 LAB — URINE DRUG SCREEN, QUALITATIVE (ARMC ONLY)
Amphetamines, Ur Screen: NOT DETECTED
Barbiturates, Ur Screen: NOT DETECTED
Benzodiazepine, Ur Scrn: NOT DETECTED
Cannabinoid 50 Ng, Ur ~~LOC~~: NOT DETECTED
Cocaine Metabolite,Ur ~~LOC~~: NOT DETECTED
MDMA (Ecstasy)Ur Screen: NOT DETECTED
Methadone Scn, Ur: NOT DETECTED
Opiate, Ur Screen: NOT DETECTED
Phencyclidine (PCP) Ur S: NOT DETECTED
Tricyclic, Ur Screen: NOT DETECTED

## 2023-02-13 LAB — URINALYSIS, COMPLETE (UACMP) WITH MICROSCOPIC
Bilirubin Urine: NEGATIVE
Glucose, UA: 50 mg/dL — AB
Hgb urine dipstick: NEGATIVE
Ketones, ur: NEGATIVE mg/dL
Leukocytes,Ua: NEGATIVE
Nitrite: POSITIVE — AB
Protein, ur: 30 mg/dL — AB
Specific Gravity, Urine: 1.009 (ref 1.005–1.030)
pH: 6 (ref 5.0–8.0)

## 2023-02-13 LAB — CBC
HCT: 41.1 % (ref 39.0–52.0)
Hemoglobin: 13 g/dL (ref 13.0–17.0)
MCH: 30.7 pg (ref 26.0–34.0)
MCHC: 31.6 g/dL (ref 30.0–36.0)
MCV: 97.2 fL (ref 80.0–100.0)
Platelets: 155 10*3/uL (ref 150–400)
RBC: 4.23 MIL/uL (ref 4.22–5.81)
RDW: 17.6 % — ABNORMAL HIGH (ref 11.5–15.5)
WBC: 10.3 10*3/uL (ref 4.0–10.5)
nRBC: 0 % (ref 0.0–0.2)

## 2023-02-13 LAB — CBC WITH DIFFERENTIAL/PLATELET
Abs Immature Granulocytes: 0.08 10*3/uL — ABNORMAL HIGH (ref 0.00–0.07)
Basophils Absolute: 0.1 10*3/uL (ref 0.0–0.1)
Basophils Relative: 1 %
Eosinophils Absolute: 0.1 10*3/uL (ref 0.0–0.5)
Eosinophils Relative: 1 %
HCT: 42.7 % (ref 39.0–52.0)
Hemoglobin: 13.6 g/dL (ref 13.0–17.0)
Immature Granulocytes: 1 %
Lymphocytes Relative: 16 %
Lymphs Abs: 1.9 10*3/uL (ref 0.7–4.0)
MCH: 31.1 pg (ref 26.0–34.0)
MCHC: 31.9 g/dL (ref 30.0–36.0)
MCV: 97.5 fL (ref 80.0–100.0)
Monocytes Absolute: 0.8 10*3/uL (ref 0.1–1.0)
Monocytes Relative: 6 %
Neutro Abs: 9.1 10*3/uL — ABNORMAL HIGH (ref 1.7–7.7)
Neutrophils Relative %: 75 %
Platelets: 146 10*3/uL — ABNORMAL LOW (ref 150–400)
RBC: 4.38 MIL/uL (ref 4.22–5.81)
RDW: 17.8 % — ABNORMAL HIGH (ref 11.5–15.5)
WBC: 12 10*3/uL — ABNORMAL HIGH (ref 4.0–10.5)
nRBC: 0 % (ref 0.0–0.2)

## 2023-02-13 LAB — BLOOD GAS, ARTERIAL
Acid-base deficit: 2.4 mmol/L — ABNORMAL HIGH (ref 0.0–2.0)
Bicarbonate: 25.9 mmol/L (ref 20.0–28.0)
Delivery systems: POSITIVE
Expiratory PAP: 5 cmH2O
FIO2: 40 %
Inspiratory PAP: 14 cmH2O
O2 Saturation: 91.2 %
Patient temperature: 37
RATE: 10 resp/min
pCO2 arterial: 59 mmHg — ABNORMAL HIGH (ref 32–48)
pH, Arterial: 7.25 — ABNORMAL LOW (ref 7.35–7.45)
pO2, Arterial: 63 mmHg — ABNORMAL LOW (ref 83–108)

## 2023-02-13 LAB — RESP PANEL BY RT-PCR (RSV, FLU A&B, COVID)  RVPGX2
Influenza A by PCR: NEGATIVE
Influenza B by PCR: NEGATIVE
Resp Syncytial Virus by PCR: NEGATIVE
SARS Coronavirus 2 by RT PCR: NEGATIVE

## 2023-02-13 LAB — BLOOD GAS, VENOUS
Acid-base deficit: 6.1 mmol/L — ABNORMAL HIGH (ref 0.0–2.0)
Bicarbonate: 22.9 mmol/L (ref 20.0–28.0)
O2 Saturation: 65.9 %
Patient temperature: 37
pCO2, Ven: 60 mmHg (ref 44–60)
pH, Ven: 7.19 — CL (ref 7.25–7.43)
pO2, Ven: 42 mmHg (ref 32–45)

## 2023-02-13 LAB — LACTIC ACID, PLASMA
Lactic Acid, Venous: 3.3 mmol/L (ref 0.5–1.9)
Lactic Acid, Venous: 1.2 mmol/L (ref 0.5–1.9)

## 2023-02-13 LAB — BRAIN NATRIURETIC PEPTIDE: B Natriuretic Peptide: 540.8 pg/mL — ABNORMAL HIGH (ref 0.0–100.0)

## 2023-02-13 LAB — COMPREHENSIVE METABOLIC PANEL
ALT: 11 U/L (ref 0–44)
AST: 31 U/L (ref 15–41)
Albumin: 3.8 g/dL (ref 3.5–5.0)
Alkaline Phosphatase: 91 U/L (ref 38–126)
Anion gap: 11 (ref 5–15)
BUN: 20 mg/dL (ref 8–23)
CO2: 20 mmol/L — ABNORMAL LOW (ref 22–32)
Calcium: 8.7 mg/dL — ABNORMAL LOW (ref 8.9–10.3)
Chloride: 103 mmol/L (ref 98–111)
Creatinine, Ser: 1.2 mg/dL (ref 0.61–1.24)
GFR, Estimated: >60 mL/min (ref >60)
Glucose, Bld: 224 mg/dL — ABNORMAL HIGH (ref 70–99)
Potassium: 4.6 mmol/L (ref 3.5–5.1)
Sodium: 134 mmol/L — ABNORMAL LOW (ref 135–145)
Total Bilirubin: 1.7 mg/dL — ABNORMAL HIGH (ref 0.3–1.2)
Total Protein: 7.9 g/dL (ref 6.5–8.1)

## 2023-02-13 LAB — CBG MONITORING, ED
Glucose-Capillary: 217 mg/dL — ABNORMAL HIGH (ref 70–99)
Glucose-Capillary: 99 mg/dL (ref 70–99)

## 2023-02-13 LAB — CREATININE, SERUM
Creatinine, Ser: 1.14 mg/dL (ref 0.61–1.24)
GFR, Estimated: >60 mL/min (ref >60)

## 2023-02-13 LAB — APTT: aPTT: 28 seconds (ref 24–36)

## 2023-02-13 LAB — PROTIME-INR
INR: 1.3 — ABNORMAL HIGH (ref 0.8–1.2)
Prothrombin Time: 16 seconds — ABNORMAL HIGH (ref 11.4–15.2)

## 2023-02-13 LAB — ETHANOL: Alcohol, Ethyl (B): <10 mg/dL (ref <10)

## 2023-02-13 LAB — TSH: TSH: 1.007 u[IU]/mL (ref 0.350–4.500)

## 2023-02-13 LAB — PROCALCITONIN: Procalcitonin: <0.1 ng/mL

## 2023-02-13 LAB — T4, FREE: Free T4: 1.13 ng/dL — ABNORMAL HIGH (ref 0.61–1.12)

## 2023-02-13 LAB — TROPONIN I (HIGH SENSITIVITY): Troponin I (High Sensitivity): 7 ng/L (ref <18)

## 2023-02-13 MED ORDER — ATORVASTATIN CALCIUM 20 MG PO TABS
40.0000 mg | ORAL_TABLET | Freq: Every day | ORAL | Status: DC
  Administered 2023-02-13 – 2023-02-17 (×5): 40 mg via ORAL
  Filled 2023-02-13 (×5): qty 2

## 2023-02-13 MED ORDER — ONDANSETRON HCL 4 MG/2ML IJ SOLN: 4.0000 mg | Freq: Four times a day (QID) | INTRAMUSCULAR | Status: DC | PRN

## 2023-02-13 MED ORDER — ACETAMINOPHEN 650 MG RE SUPP: 650.0000 mg | Freq: Four times a day (QID) | RECTAL | Status: DC | PRN

## 2023-02-13 MED ORDER — SODIUM CHLORIDE 0.9 % IV SOLN: 2.0000 g | INTRAVENOUS | Status: DC

## 2023-02-13 MED ORDER — ENOXAPARIN SODIUM 60 MG/0.6ML IJ SOSY
0.5000 mg/kg | PREFILLED_SYRINGE | INTRAMUSCULAR | Status: DC
  Administered 2023-02-13 – 2023-02-16 (×4): 55 mg via SUBCUTANEOUS
  Filled 2023-02-13 (×4): qty 0.6

## 2023-02-13 MED ORDER — ONDANSETRON HCL 4 MG PO TABS
4.0000 mg | ORAL_TABLET | Freq: Four times a day (QID) | ORAL | Status: DC | PRN
  Filled 2023-02-13: qty 1

## 2023-02-13 MED ORDER — INSULIN ASPART 100 UNIT/ML IJ SOLN
0.0000 [IU] | Freq: Three times a day (TID) | INTRAMUSCULAR | Status: DC
  Administered 2023-02-14: 3 [IU] via SUBCUTANEOUS
  Administered 2023-02-15: 5 [IU] via SUBCUTANEOUS
  Administered 2023-02-15 – 2023-02-16 (×3): 2 [IU] via SUBCUTANEOUS
  Administered 2023-02-16: 5 [IU] via SUBCUTANEOUS
  Administered 2023-02-17: 3 [IU] via SUBCUTANEOUS
  Filled 2023-02-13 (×7): qty 1

## 2023-02-13 MED ORDER — FUROSEMIDE 10 MG/ML IJ SOLN
40.0000 mg | Freq: Once | INTRAMUSCULAR | Status: AC
  Administered 2023-02-13: 40 mg via INTRAVENOUS
  Filled 2023-02-13: qty 4

## 2023-02-13 MED ORDER — SODIUM CHLORIDE 0.9 % IV SOLN
2.0000 g | Freq: Once | INTRAVENOUS | Status: AC
  Administered 2023-02-13: 2 g via INTRAVENOUS
  Filled 2023-02-13: qty 20

## 2023-02-13 MED ORDER — ASPIRIN 81 MG PO TBEC
81.0000 mg | DELAYED_RELEASE_TABLET | Freq: Every day | ORAL | Status: DC
  Administered 2023-02-13 – 2023-02-17 (×5): 81 mg via ORAL
  Filled 2023-02-13 (×5): qty 1

## 2023-02-13 MED ORDER — INSULIN ASPART 100 UNIT/ML IJ SOLN
0.0000 [IU] | Freq: Every day | INTRAMUSCULAR | Status: DC
  Administered 2023-02-16: 2 [IU] via SUBCUTANEOUS
  Filled 2023-02-13: qty 1

## 2023-02-13 MED ORDER — SODIUM CHLORIDE 0.9 % IV SOLN
500.0000 mg | Freq: Once | INTRAVENOUS | Status: AC
  Administered 2023-02-13: 500 mg via INTRAVENOUS
  Filled 2023-02-13: qty 5

## 2023-02-13 MED ORDER — SODIUM CHLORIDE 0.9% FLUSH
3.0000 mL | Freq: Two times a day (BID) | INTRAVENOUS | Status: DC
  Administered 2023-02-13 – 2023-02-17 (×8): 3 mL via INTRAVENOUS

## 2023-02-13 MED ORDER — HYDROCODONE-ACETAMINOPHEN 5-325 MG PO TABS: 1.0000 | ORAL_TABLET | ORAL | Status: DC | PRN

## 2023-02-13 MED ORDER — SODIUM CHLORIDE 0.9 % IV SOLN: 500.0000 mg | INTRAVENOUS | Status: DC

## 2023-02-13 MED ORDER — IOHEXOL 350 MG/ML SOLN
75.0000 mL | Freq: Once | INTRAVENOUS | Status: AC | PRN
  Administered 2023-02-13: 75 mL via INTRAVENOUS

## 2023-02-13 MED ORDER — LISINOPRIL 20 MG PO TABS
40.0000 mg | ORAL_TABLET | Freq: Every day | ORAL | Status: DC
  Administered 2023-02-13 – 2023-02-17 (×4): 40 mg via ORAL
  Filled 2023-02-13 (×2): qty 2
  Filled 2023-02-13: qty 4
  Filled 2023-02-13: qty 2
  Filled 2023-02-13: qty 4

## 2023-02-13 MED ORDER — FUROSEMIDE 10 MG/ML IJ SOLN
40.0000 mg | Freq: Two times a day (BID) | INTRAMUSCULAR | Status: DC
  Administered 2023-02-13: 40 mg via INTRAVENOUS
  Filled 2023-02-13 (×2): qty 4

## 2023-02-13 MED ORDER — ACETAMINOPHEN 325 MG PO TABS
650.0000 mg | ORAL_TABLET | Freq: Four times a day (QID) | ORAL | Status: DC | PRN
  Administered 2023-02-14 – 2023-02-17 (×3): 650 mg via ORAL
  Filled 2023-02-13 (×3): qty 2

## 2023-02-13 NOTE — Assessment & Plan Note (Signed)
Chronic pain secondary to cervical spinal stenosis Judicious opiate use in the setting of acute respiratory failure

## 2023-02-13 NOTE — ED Triage Notes (Signed)
Pt arrived via ems from home. Pt was found in his backyard unresponsive. Pt was bagged pta with ems due to recurrent episodes of unresponsiveness. Ems states non-rebreather and 88 % o2 sat. Pt given 43m zofran pta.  Pt 71% on non-rebreather upon arrival.

## 2023-02-13 NOTE — H&P (Addendum)
History and Physical    Patient: Willie Krueger DOB: 1956/03/04 DOA: 02/13/2023 DOS: the patient was seen and examined on 02/13/2023 PCP: Danae Orleans, MD  Patient coming from: Home  Chief Complaint:  Chief Complaint  Patient presents with   Loss of Consciousness    HPI: Willie Krueger is a 67 y.o. male with medical history significant for HTN, DM with PVD and past history of osteomyelitis, prior history of upper extremity DVT previously on Xarelto back in 2017, chronic pain from spinal stenosis on chronic opiates, brought in by EMS with altered mental status after he was found in his backyard unresponsive.  He was briefly bagged by EMS and then placed on nonrebreather for transport.  Patient reported having a cough and shortness of breath.  He denied chest pain, fever or chills. ED course and data review: On arrival, O2 sat 82% on nonrebreather at 15 L and vitals otherwise unremarkable.  He was transitioned to BiPAP.  ABG showed a pH of 7.19 on arrival improving to 7.25 on BiPAP at 40% with pCO2 59 and pO2 63.  UDS was clean.  WBC 12,000 with lactic acid 1.2.  Respiratory viral panel negative troponin 7, BNP 540.  Blood glucose 224, total bili 1.7.  Urinalysis with positive nitrites and few bacteria.EKG, personally viewed and interpreted shows sinus at 94 with no ischemic ST-T wave changes.  CTA chest negative for PE but consistent with groundglass airspace disease and pulmonary edema, further details as follows: IMPRESSION: 1. No evidence of pulmonary embolus. 2. Upper lobe predominant interlobular septal thickening and ground-glass airspace disease, consistent with pulmonary edema. 3. Cardiomegaly, with prominent right atrial and right ventricular dilation. No pericardial effusion. 4. Aortic Atherosclerosis (ICD10-I70.0). Coronary artery atherosclerosis. 5. Continued heterogeneous enlargement of the thyroid. Recommend nonemergent outpatient thyroid ultrasound if not  previously performed. (Ref: J Am Coll Radiol. 2015 Feb;12(2): 143-50).  Patient treated with IV Lasix, started on Rocephin and azithromycin.  Hospitalist consulted for admission.     Past Medical History:  Diagnosis Date   Acute postoperative pain 02/09/2017   Chronic pain    Chronic pain associated with significant psychosocial dysfunction 05/04/2015   Last Assessment & Plan:  He is having worsening problems with breakthrough pain. Gabapentin is increased from 800 mg 3 times daily to 1200 mg 3 times daily. Oxycodone is also renewed. He continues to through evaluation at [facility] pain clinic.    Diabetes mellitus without complication (HCC)    Heart murmur    History of spinal stenosis 10/31/2015   C3-4 with severe central canal stenosis with small amount of myelomalacia present. Moderate to severe canal stenosis is also present at C4-5 and C5-6. Moderate to severe neuroforaminal narrowing between the levels of C2-C7   Hyperlipidemia    Hypertension    Leg weakness 07/07/2014   Mitral valve regurgitation    Nerve root inflammation 06/26/2014   Last Assessment & Plan:  Status is unchanged with ongoing pain and limited mobility. Continue with physical therapy and pain management.    Osteoarthritis    Past Surgical History:  Procedure Laterality Date   AMPUTATION TOE Left 10/2016   partial   CARPAL TUNNEL RELEASE     FOOT SURGERY Left 09/2016   IRRIGATION AND DEBRIDEMENT FOOT Left 10/13/2016   Procedure: IRRIGATION AND DEBRIDEMENT FOOT;  Surgeon: Samara Deist, DPM;  Location: [facility] ORS;  Service: Podiatry;  Laterality: Left;   PICC LINE PLACE PERIPHERAL ([place] HX) Left [id number]   SPINE SURGERY  TOTAL KNEE ARTHROPLASTY Left 02/14/2022   Procedure: TOTAL KNEE ARTHROPLASTY;  Surgeon: Dorna Leitz, MD;  Location: [facility] ORS;  Service: Orthopedics;  Laterality: Left;   Social History:  reports that he has been smoking cigarettes. He has been smoking an average of 1.5 packs per day. He has never  used smokeless tobacco. He reports that he does not drink alcohol and does not use drugs.  Allergies  Allergen Reactions   Piperacillin-Tazobactam In Dex Rash   Cleocin [Clindamycin] Rash   Clindamycin Phosphate Rash   Sulfa Antibiotics Rash   Zosyn [Piperacillin Sod-Tazobactam So] Rash    Family History  Problem Relation Age of Onset   Cancer Mother    Diabetes Mother    Heart disease Father     Prior to Admission medications   Medication Sig Start Date End Date Taking? Authorizing Provider  acetaminophen (TYLENOL) 650 MG CR tablet Take 1,300 mg by mouth every 4 (four) hours as needed for pain.   Yes [provider]  aspirin EC 81 MG tablet Take 81 mg by mouth daily.   Yes [provider]  cetirizine (ZYRTEC) 10 MG tablet Take 10 mg by mouth daily.   Yes [provider]  DULoxetine (CYMBALTA) 30 MG capsule Take 30 mg by mouth daily.   Yes [provider]  gabapentin (NEURONTIN) 800 MG tablet Take 800 mg by mouth 4 (four) times daily.   Yes [provider]  glimepiride (AMARYL) 2 MG tablet Take 2 mg by mouth daily with breakfast.    Yes [provider]  lisinopril (PRINIVIL,ZESTRIL) 40 MG tablet Take 40 mg by mouth daily.   Yes [provider]  metaxalone (SKELAXIN) 400 MG tablet Take 400 mg by mouth 2 (two) times daily.   Yes [provider]  metFORMIN (GLUCOPHAGE-XR) 500 MG 24 hr tablet Take 1,500 mg by mouth daily with breakfast.   Yes [provider]  atorvastatin (LIPITOR) 40 MG tablet Take 40 mg by mouth daily.    [provider]  docusate sodium (COLACE) 100 MG capsule Take 1 capsule (100 mg total) by mouth 2 (two) times daily. Patient not taking: Reported on 02/13/2023 02/14/22   Gary Fleet, PA-C  furosemide (LASIX) 20 MG tablet Take 20 mg by mouth as directed.    [provider]    Physical Exam: Vitals:   02/13/23 1618 02/13/23 1622 02/13/23 1626 02/13/23 2015  BP:  112/71   (!) 140/94  Pulse:    90  Resp:    17  Temp:  (!) 96.2 F (35.7 C)    TempSrc:  Axillary    SpO2:   93% 93%   Physical Exam Vitals and nursing note reviewed.  Constitutional:      Comments: On BiPAP  HENT:     Head: Normocephalic and atraumatic.  Cardiovascular:     Rate and Rhythm: Normal rate and regular rhythm.     Heart sounds: Normal heart sounds.  Pulmonary:     Breath sounds: Rales present.  Abdominal:     Palpations: Abdomen is soft.     Tenderness: There is no abdominal tenderness.  Musculoskeletal:     Right lower leg: Edema present.     Left lower leg: Edema present.  Neurological:     General: No focal deficit present.     Mental Status: He is easily aroused. He is lethargic.     Labs on Admission: I have personally reviewed following labs and imaging studies  CBC:  Recent Labs  Lab 02/13/23 1620  WBC 12.0*  NEUTROABS 9.1*  HGB 13.6  HCT 42.7  MCV 97.5  PLT 123456*   Basic Metabolic Panel: Recent Labs  Lab 02/13/23 1620  NA 134*  K 4.6  CL 103  CO2 20*  GLUCOSE 224*  BUN 20  CREATININE 1.20  CALCIUM 8.7*   GFR: CrCl cannot be calculated (Unknown ideal weight.). Liver Function Tests: Recent Labs  Lab 02/13/23 1620  AST 31  ALT 11  ALKPHOS 91  BILITOT 1.7*  PROT 7.9  ALBUMIN 3.8   No results for input(s): "LIPASE", "AMYLASE" in the last 168 hours. No results for input(s): "AMMONIA" in the last 168 hours. Coagulation Profile: Recent Labs  Lab 02/13/23 1620  INR 1.3*   Cardiac Enzymes: No results for input(s): "CKTOTAL", "CKMB", "CKMBINDEX", "TROPONINI" in the last 168 hours. BNP (last 3 results) No results for input(s): "PROBNP" in the last 8760 hours. HbA1C: No results for input(s): "HGBA1C" in the last 72 hours. CBG: Recent Labs  Lab 02/13/23 1620  GLUCAP 217*   Lipid Profile: No results for input(s): "CHOL", "HDL", "LDLCALC", "TRIG", "CHOLHDL", "LDLDIRECT" in the last 72 hours. Thyroid Function Tests: Recent  Labs    02/13/23 1620  TSH 1.007  FREET4 1.13*   Anemia Panel: No results for input(s): "VITAMINB12", "FOLATE", "FERRITIN", "TIBC", "IRON", "RETICCTPCT" in the last 72 hours. Urine analysis:    Component Value Date/Time   COLORURINE YELLOW (A) 02/13/2023 1826   APPEARANCEUR HAZY (A) 02/13/2023 1826   APPEARANCEUR Clear 01/01/2014 2238   LABSPEC 1.009 02/13/2023 1826   LABSPEC 1.025 01/01/2014 2238   PHURINE 6.0 02/13/2023 1826   GLUCOSEU 50 (A) 02/13/2023 1826   GLUCOSEU Negative 01/01/2014 2238   HGBUR NEGATIVE 02/13/2023 1826   BILIRUBINUR NEGATIVE 02/13/2023 1826   BILIRUBINUR Negative 01/01/2014 2238   KETONESUR NEGATIVE 02/13/2023 1826   PROTEINUR 30 (A) 02/13/2023 1826   NITRITE POSITIVE (A) 02/13/2023 1826   LEUKOCYTESUR NEGATIVE 02/13/2023 1826   LEUKOCYTESUR Negative 01/01/2014 2238    Radiological Exams on Admission: CT Angio Chest PE W and/or Wo Contrast  Result Date: 02/13/2023 CLINICAL DATA:  Found unresponsive status post resuscitation EXAM: CT ANGIOGRAPHY CHEST WITH CONTRAST TECHNIQUE: Multidetector CT imaging of the chest was performed using the standard protocol during bolus administration of intravenous contrast. Multiplanar CT image reconstructions and MIPs were obtained to evaluate the vascular anatomy. RADIATION DOSE REDUCTION: This exam was performed according to the departmental dose-optimization program which includes automated exposure control, adjustment of the mA and/or kV according to patient size and/or use of iterative reconstruction technique. CONTRAST:  65m OMNIPAQUE IOHEXOL 350 MG/ML SOLN COMPARISON:  02/13/2023, 11/21/2016 FINDINGS: Cardiovascular: This is a technically adequate evaluation of the pulmonary vasculature. No filling defects or pulmonary emboli. Mild cardiomegaly, with prominent dilatation of the right atrium and right ventricle. No pericardial effusion. Atherosclerosis of the coronary vasculature greatest in the LAD and circumflex  distribution. Normal caliber of the thoracic aorta. Stable atherosclerosis of the aortic arch. Mediastinum/Nodes: Stable heterogeneous large mint of the thyroid. Trachea and esophagus are unremarkable. No pathologic mediastinal, hilar, or axillary adenopathy. Lungs/Pleura: Scattered hypoventilatory changes within the dependent lower lobes. There is upper lobe predominant interlobular septal thickening and ground-glass airspace disease, most consistent with developing pulmonary edema. No effusion or pneumothorax. Central airways are patent. Upper Abdomen: No acute abnormality. Musculoskeletal: No acute or destructive bony lesions. Reconstructed images demonstrate no additional findings. Review of the MIP images confirms the above findings. IMPRESSION: 1. No  evidence of pulmonary embolus. 2. Upper lobe predominant interlobular septal thickening and ground-glass airspace disease, consistent with pulmonary edema. 3. Cardiomegaly, with prominent right atrial and right ventricular dilation. No pericardial effusion. 4. Aortic Atherosclerosis (ICD10-I70.0). Coronary artery atherosclerosis. 5. Continued heterogeneous enlargement of the thyroid. Recommend nonemergent outpatient thyroid ultrasound if not previously performed. (Ref: J Am Coll Radiol. 2015 Feb;12(2): 143-50). Electronically Signed   By: Randa Ngo M.D.   On: 02/13/2023 20:21   CT HEAD WO CONTRAST (5MM)  Result Date: 02/13/2023 CLINICAL DATA:  Mental status changes. Unknown cause. Recurrent episodes of unresponsiveness. EXAM: CT HEAD WITHOUT CONTRAST TECHNIQUE: Contiguous axial images were obtained from the base of the skull through the vertex without intravenous contrast. RADIATION DOSE REDUCTION: This exam was performed according to the departmental dose-optimization program which includes automated exposure control, adjustment of the mA and/or kV according to patient size and/or use of iterative reconstruction technique. COMPARISON:  None Available.  FINDINGS: Brain: There is no evidence for acute hemorrhage, hydrocephalus, mass lesion, or abnormal extra-axial fluid collection. No definite CT evidence for acute infarction. Diffuse loss of parenchymal volume is consistent with atrophy. Vascular: No hyperdense vessel or unexpected calcification. Skull: No evidence for fracture. No worrisome lytic or sclerotic lesion. Sinuses/Orbits: The visualized paranasal sinuses and mastoid air cells are clear. Visualized portions of the globes and intraorbital fat are unremarkable. Other: None. IMPRESSION: 1. No acute intracranial abnormality. 2. Atrophy with chronic small vessel ischemic disease. Electronically Signed   By: Misty Stanley M.D.   On: 02/13/2023 17:15   DG Chest Port 1 View  Result Date: 02/13/2023 CLINICAL DATA:  Questionable sepsis, found unresponsive EXAM: PORTABLE CHEST 1 VIEW COMPARISON:  10/16/2016 FINDINGS: Two frontal views of the chest demonstrate an enlarged cardiac silhouette. There is increased vascular congestion, with diffuse interstitial and ground-glass opacities suspicious for developing edema. Superimposed infection cannot be excluded. No effusion or pneumothorax. No acute bony abnormalities. IMPRESSION: 1. Constellation of findings most consistent with congestive heart failure and developing pulmonary edema. Electronically Signed   By: Randa Ngo M.D.   On: 02/13/2023 16:48     Data Reviewed: Relevant notes from primary care and specialist visits, past discharge summaries as available in EHR, including Care Everywhere. Prior diagnostic testing as pertinent to current admission diagnoses Updated medications and problem lists for reconciliation ED course, including vitals, labs, imaging, treatment and response to treatment Triage notes, nursing and pharmacy notes and ED provider's notes Notable results as noted in HPI   Assessment and Plan: * Flash pulmonary edema ([place]) Acute congestive heart failure Patient likely fluid  overloaded  CTA chest showing pulmonary edema, cardiomegaly with prominent right atrial and right ventricular dilatation Continue IV Lasix Daily weights with intake and output monitoring Echocardiogram  Acute respiratory failure with hypoxia and hypercapnia (HCC) Acute respiratory acidosis CTA negative for PE showing airspace disease and pulmonary edema as well as prominent right atrial and right ventricular dilatation Likely acute heart failure, with possible underlying pneumonia UDS was clean Continue BiPAP and wean as tolerated Treat underlying etiologies  CAP (community acquired pneumonia) Patient reports a cough and he has mild leukocytosis of 12,000 Respiratory viral panel negative Will continue empiric antibiotics with Rocephin and azithromycin Follow procalcitonin Albuterol as needed, antitussives  Syncope and collapse Patient was found unresponsive in his backyard Suspect related to acute respiratory failure CT head nonacute Syncope workup with continuous cardiac monitoring, echocardiogram Neurologic checks  Uncontrolled type 2 diabetes mellitus with hyperglycemia, without long-term current use of insulin (  [place]) Blood glucose was 223 Sliding scale insulin and will hold home oral hypoglycemics for now  History of DVT (deep vein thrombosis) History of upper extremity DVT in 2017 previously on Xarelto CTA negative for PE  Abnormal urinalysis Follow urine culture Patient will be on Rocephin for presumed CAP  Long term prescription opiate use Chronic pain secondary to cervical spinal stenosis Judicious opiate use in the setting of acute respiratory failure  HTN (hypertension) Continue lisinopril    DVT prophylaxis: Lovenox  Consults: none  Advance Care Planning:   Code Status: Prior   Family Communication: none  Disposition Plan: Back to previous home environment  Severity of Illness: The appropriate patient status for this patient is INPATIENT.  Inpatient status is judged to be reasonable and necessary in order to provide the required intensity of service to ensure the patient's safety. The patient's presenting symptoms, physical exam findings, and initial radiographic and laboratory data in the context of their chronic comorbidities is felt to place them at high risk for further clinical deterioration. Furthermore, it is not anticipated that the patient will be medically stable for discharge from the hospital within 2 midnights of admission.   * I certify that at the point of admission it is my clinical judgment that the patient will require inpatient hospital care spanning beyond 2 midnights from the point of admission due to high intensity of service, high risk for further deterioration and high frequency of surveillance required.*  Author: Athena Masse, MD 02/13/2023 8:50 PM  For on call review [email or web address].

## 2023-02-13 NOTE — Assessment & Plan Note (Signed)
Patient reports a cough and he has mild leukocytosis of 12,000 Respiratory viral panel negative Will continue empiric antibiotics with Rocephin and azithromycin Follow procalcitonin Albuterol as needed, antitussives

## 2023-02-13 NOTE — Assessment & Plan Note (Signed)
Continue lisinopril

## 2023-02-13 NOTE — Assessment & Plan Note (Signed)
Follow urine culture Patient will be on Rocephin for presumed CAP

## 2023-02-13 NOTE — ED Notes (Signed)
Warm blankets placed on pt. 

## 2023-02-13 NOTE — Assessment & Plan Note (Addendum)
Acute respiratory acidosis CTA negative for PE showing airspace disease and pulmonary edema as well as prominent right atrial and right ventricular dilatation Likely acute heart failure, with possible underlying pneumonia UDS was clean Continue BiPAP and wean as tolerated Treat underlying etiologies

## 2023-02-13 NOTE — ED Provider Notes (Signed)
[facility] Provider Note    Event Date/Time   First MD Initiated Contact with Patient 02/13/23 1618     (approximate)   History   Loss of Consciousness   HPI  Willie Krueger is a 67 y.o. male with past medical history of diabetes, hypertension, hyperlipidemia, chronic pain who presents with altered mental status.  Patient is not able to provide much history given altered mental status.  Per EMS patient was found in his backyard unresponsive.  He was briefly bagged by EMS and then placed on nonrebreather.  Patient says he has been having difficulty breathing with some cough.  Denies pain.  Denies drug or alcohol use.  Tells me his only medical history is diabetes.    Past Medical History:  Diagnosis Date   Acute postoperative pain 02/09/2017   Chronic pain    Chronic pain associated with significant psychosocial dysfunction 05/04/2015   Last Assessment & Plan:  He is having worsening problems with breakthrough pain. Gabapentin is increased from 800 mg 3 times daily to 1200 mg 3 times daily. Oxycodone is also renewed. He continues to through evaluation at [facility] pain clinic.    Diabetes mellitus without complication (HCC)    Heart murmur    History of spinal stenosis 10/31/2015   C3-4 with severe central canal stenosis with small amount of myelomalacia present. Moderate to severe canal stenosis is also present at C4-5 and C5-6. Moderate to severe neuroforaminal narrowing between the levels of C2-C7   Hyperlipidemia    Hypertension    Leg weakness 07/07/2014   Mitral valve regurgitation    Nerve root inflammation 06/26/2014   Last Assessment & Plan:  Status is unchanged with ongoing pain and limited mobility. Continue with physical therapy and pain management.    Osteoarthritis     Patient Active Problem List   Diagnosis Date Noted   Osteoarthritis of left knee 02/24/2022   Primary osteoarthritis of left knee 02/14/2022   Overweight 08/13/2020    Osteoarthritis of knees, bilateral 04/13/2020   Chronic venous stasis 03/12/2018   Degeneration of lumbosacral intervertebral disc 06/01/2017   History of spinal surgery 06/01/2017   Synovial cyst of lumbar spine 06/01/2017   Acute neck pain 06/01/2017   Osteoarthritis of shoulders (B) (R>L) 06/01/2017   Cervical spondylosis 06/01/2017   Osteoarthritis 03/16/2017   Enlarged thyroid 02/02/2017   Chronic pain syndrome 12/04/2016   Allergic drug rash due to anti-infective agent 11/28/2016   Allergy to drug 11/21/2016   Diabetic foot ulcer with osteomyelitis ([place]) 11/14/2016   Diabetic infection of left foot ([place]) 10/13/2016   Lumbar spondylosis 06/03/2016   Lumbar facet syndrome (Location of Primary Source of Pain) (Bilateral) (R>L) 05/28/2016   Abnormal MRI, lumbar spine (05/19/2016) 05/28/2016   Neurogenic pain 02/27/2016   Diabetic peripheral neuropathy ([place]) 02/27/2016   Chronic shoulder pain (Location of Tertiary source of pain) (Bilateral) (R>L) 02/27/2016   Type 2 diabetes mellitus with foot ulcer (CODE) ([place]) 01/23/2016   Diabetic osteomyelitis ([place]) 12/05/2015   Neuropathy 12/05/2015   Obesity 12/05/2015   Encounter for chronic pain management 12/03/2015   Elevated C-reactive protein (CRP) 12/03/2015   Long term current use of opiate analgesic 10/31/2015   Long term prescription opiate use 10/31/2015   Opiate use (67.5 MME/Day) 10/31/2015   Opiate dependence ([place]) 10/31/2015   Encounter for therapeutic drug level monitoring 10/31/2015   Chronic neck pain (Right side) 10/31/2015   Cervical post-laminectomy syndrome 10/31/2015   Chronic low back  pain (Location of Primary Source of Pain) (Midline pain) (R>L) 10/31/2015   Chronic cervical radicular pain (Bilateral) (R>L) 10/31/2015   Chronic lumbar radicular pain (Bilateral) (R>L) 10/31/2015   Substance use disorder Risk: LOW 10/31/2015   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   Chronic lower extremity pain (Location of  Secondary source of pain) (Bilateral) (R>L) 10/31/2015   History of carpal tunnel release of both wrists 10/31/2015   Grade I anterolisthesis of C7 on T1 10/31/2015   Toe ulcer ([place]) 06/24/2015   Cervical spinal cord compression ([place]) 02/07/2015   MI (mitral incompetence) 11/11/2014   Cardiac murmur 11/09/2014   History of decompression of median nerve 05/19/2014   HLD (hyperlipidemia) 10/20/2012   Benign hypertension 10/20/2012   ED (erectile dysfunction) of organic origin 10/20/2012   Chronic hepatitis C virus infection ([place]) 10/02/2011   Adiposity 10/02/2011   Current smoker 10/02/2011     Physical Exam  Triage Vital Signs: ED Triage Vitals  Enc Vitals Group     BP 02/13/23 1618 112/71     Pulse Rate 02/13/23 1616 94     Resp 02/13/23 1616 12     Temp 02/13/23 1622 (!) 96.2 F (35.7 C)     Temp Source 02/13/23 1622 Axillary     SpO2 02/13/23 1616 (!) 82 %     Weight --      Height --      Head Circumference --      Peak Flow --      Pain Score --      Pain Loc --      Pain Edu? --      Excl. in [place]? --     Most recent vital signs: Vitals:   02/13/23 1622 02/13/23 1626  BP:    Pulse:    Resp:    Temp: (!) 96.2 F (35.7 C)   SpO2:  93%     General: Awake, patient is sleepy but awakens appropriately, not in respiratory distress CV:  Good peripheral perfusion.  Pitting edema bilateral lower extremities with chronic venous stasis changes Resp:  Normal effort.  Patient has normal respiratory effort, no increased work of breathing, breath sounds anteriorly sound normal Abd:  No distention.  Soft nontender Neuro:             Awake, Alert, Oriented x 3  Other:  Patient's pupils are dilated but responsive, moves all extremities symmetrically Somewhat slow to respond but alert and oriented x 3 and follows commands   ED Results / Procedures / Treatments  Labs (all labs ordered are listed, but only abnormal results are displayed) Labs Reviewed  LACTIC ACID, PLASMA  - Abnormal; Notable for the following components:      Result Value   Lactic Acid, Venous 3.3 (*)    All other components within normal limits  COMPREHENSIVE METABOLIC PANEL - Abnormal; Notable for the following components:   Sodium 134 (*)    CO2 20 (*)    Glucose, Bld 224 (*)    Calcium 8.7 (*)    Total Bilirubin 1.7 (*)    All other components within normal limits  CBC WITH DIFFERENTIAL/PLATELET - Abnormal; Notable for the following components:   WBC 12.0 (*)    RDW 17.8 (*)    Platelets 146 (*)    Neutro Abs 9.1 (*)    Abs Immature Granulocytes 0.08 (*)    All other components within normal limits  PROTIME-INR - Abnormal; Notable for the following components:  Prothrombin Time 16.0 (*)    INR 1.3 (*)    All other components within normal limits  T4, FREE - Abnormal; Notable for the following components:   Free T4 1.13 (*)    All other components within normal limits  BLOOD GAS, VENOUS - Abnormal; Notable for the following components:   pH, Ven 7.19 (*)    Acid-base deficit 6.1 (*)    All other components within normal limits  BRAIN NATRIURETIC PEPTIDE - Abnormal; Notable for the following components:   B Natriuretic Peptide 540.8 (*)    All other components within normal limits  BLOOD GAS, ARTERIAL - Abnormal; Notable for the following components:   pH, Arterial 7.25 (*)    pCO2 arterial 59 (*)    pO2, Arterial 63 (*)    Acid-base deficit 2.4 (*)    All other components within normal limits  CBG MONITORING, ED - Abnormal; Notable for the following components:   Glucose-Capillary 217 (*)    All other components within normal limits  RESP PANEL BY RT-PCR (RSV, FLU A&B, COVID)  RVPGX2  CULTURE, BLOOD (ROUTINE X 2)  CULTURE, BLOOD (ROUTINE X 2)  URINE CULTURE  APTT  TSH  ETHANOL  LACTIC ACID, PLASMA  URINALYSIS, COMPLETE (UACMP) WITH MICROSCOPIC  URINE DRUG SCREEN, QUALITATIVE ([facility] ONLY)  PROCALCITONIN  TROPONIN I (HIGH SENSITIVITY)     EKG  EKG  interpretation performed by myself: NSR, nml axis, nml intervals, no acute ischemic changes    RADIOLOGY I reviewed and interpreted the chest x-ray which shows interstitial edema   PROCEDURES:  Critical Care performed: Yes, see critical care procedure note(s)  Procedures  The patient is on the cardiac monitor to evaluate for evidence of arrhythmia and/or significant heart rate changes.   MEDICATIONS ORDERED IN ED: Medications  cefTRIAXone (ROCEPHIN) 2 g in sodium chloride 0.9 % 100 mL IVPB (has no administration in time range)  azithromycin (ZITHROMAX) 500 mg in sodium chloride 0.9 % 250 mL IVPB (has no administration in time range)  furosemide (LASIX) injection 40 mg (40 mg Intravenous Given 02/13/23 1758)     IMPRESSION / MDM / ASSESSMENT AND PLAN / ED COURSE  I reviewed the triage vital signs and the nursing notes.                              Patient's presentation is most consistent with acute presentation with potential threat to life or bodily function.  Differential diagnosis includes, but is not limited to, pneumonia, CHF, COPD, hypercarbia, overdose, withdrawal, intracranial hemorrhage, encephalopathy  Patient is a 67 year old male with history of diabetes hypertension hyperlipidemia who presents because of altered mental status.  Spoke with patient's wife apparently he was in his normal state of health today.  Friend called her, friend is Spanish-speaking was very upset.  Apparently patient's friend was not able to wake him.  Required BVM by EMS and was placed on nonrebreather.  Patient tells me short of breath is not provide much else in the way of history he is denying drug or alcohol use denies chest pain denies history of CHF.  On arrival to ED he is satting in the 70s on nonrebreather.  He is somnolent but does awaken appropriately alert and oriented x 3.  Does look volume overloaded with peripheral edema and chronic venous stasis changes.  I placed the patient on  BiPAP sats did improve.  Blood pressure is within normal range.  Plan to obtain broad workup including CT head and VBG.  CT head is negative.  Patient's VBG does show respiratory acidosis with pH of 719.  BNP elevated at 500.  Have a leukocytosis of 12.  Lactate is 3.3.  Patient's chest x-ray looks volume overloaded.  I performed bedside ultrasound EF does not appear to be significantly reduced.  Will give a dose of IV Lasix.  Given the lactic acidosis and patient's significant illness I will give a dose of ceftriaxone azithromycin and up some blood cultures will add on procalcitonin as well.  After about 2 hours of BiPAP repeated ABG pH is 725.  Patient still mentating okay.  Will admit to the hospitalist service.      FINAL CLINICAL IMPRESSION(S) / ED DIAGNOSES   Final diagnoses:  Acute respiratory failure with hypoxia and hypercapnia (HCC)  Acute pulmonary edema ([place])     Rx / DC Orders   ED Discharge Orders     None        Note:  This document was prepared using Dragon voice recognition software and may include unintentional dictation errors.   Rada Hay, MD 02/13/23 [phone]

## 2023-02-13 NOTE — Assessment & Plan Note (Addendum)
Acute congestive heart failure Patient likely fluid overloaded  CTA chest showing pulmonary edema, cardiomegaly with prominent right atrial and right ventricular dilatation Continue IV Lasix Daily weights with intake and output monitoring Echocardiogram

## 2023-02-13 NOTE — Assessment & Plan Note (Signed)
Blood glucose was 223 Sliding scale insulin and will hold home oral hypoglycemics for now

## 2023-02-13 NOTE — Assessment & Plan Note (Signed)
Patient was found unresponsive in his backyard Suspect related to acute respiratory failure CT head nonacute Syncope workup with continuous cardiac monitoring, echocardiogram Neurologic checks

## 2023-02-13 NOTE — Assessment & Plan Note (Signed)
History of upper extremity DVT in 2017 previously on Xarelto CTA negative for PE

## 2023-02-13 NOTE — Progress Notes (Signed)
PHARMACIST - PHYSICIAN COMMUNICATION  CONCERNING:  Enoxaparin (Lovenox) for DVT Prophylaxis    RECOMMENDATION: Patient was prescribed enoxaprin 7m q24 hours for VTE prophylaxis.   Filed Weights   02/13/23 2100  Weight: 107.5 kg (237 lb)    Body mass index is 31.27 kg/m.  Estimated Creatinine Clearance: 75.8 mL/min (by C-G formula based on SCr of 1.2 mg/dL).   Based on CDonleypatient is candidate for enoxaparin 0.553mkg TBW SQ every 24 hours based on BMI being >30.   DESCRIPTION: [facility]  Patient is now receiving enoxaparin 55 mg every 24 hours    HoLorin PicketPharmD Clinical Pharmacist  02/13/2023 9:20 PM

## 2023-02-14 ENCOUNTER — Inpatient Hospital Stay (HOSPITAL_COMMUNITY)
Admit: 2023-02-14 | Discharge: 2023-02-14 | Disposition: A | Discharge status: Home / Self Care | Payer: 59 | Attending: Internal Medicine | Admitting: Internal Medicine

## 2023-02-14 DIAGNOSIS — I5031 Acute diastolic (congestive) heart failure: Secondary | ICD-10-CM | POA: Diagnosis not present

## 2023-02-14 DIAGNOSIS — J810 Acute pulmonary edema: Secondary | ICD-10-CM

## 2023-02-14 LAB — ECHOCARDIOGRAM COMPLETE
AR max vel: 3.28 cm2
AV Area VTI: 3.37 cm2
AV Area mean vel: 3.73 cm2
AV Mean grad: 3 mmHg
AV Peak grad: 6.3 mmHg
Ao pk vel: 1.25 m/s
Area-P 1/2: 2.78 cm2
Height: 73 in
S' Lateral: 3.4 cm
Weight: 3792 oz

## 2023-02-14 LAB — HEMOGLOBIN A1C
Hgb A1c MFr Bld: 5.8 % — ABNORMAL HIGH (ref 4.8–5.6)
Mean Plasma Glucose: 119.76 mg/dL

## 2023-02-14 LAB — GLUCOSE, CAPILLARY: Glucose-Capillary: 198 mg/dL — ABNORMAL HIGH (ref 70–99)

## 2023-02-14 LAB — BASIC METABOLIC PANEL
Anion gap: 9 (ref 5–15)
BUN: 20 mg/dL (ref 8–23)
CO2: 27 mmol/L (ref 22–32)
Calcium: 8.7 mg/dL — ABNORMAL LOW (ref 8.9–10.3)
Chloride: 101 mmol/L (ref 98–111)
Creatinine, Ser: 1.11 mg/dL (ref 0.61–1.24)
GFR, Estimated: >60 mL/min (ref >60)
Glucose, Bld: 81 mg/dL (ref 70–99)
Potassium: 3.9 mmol/L (ref 3.5–5.1)
Sodium: 137 mmol/L (ref 135–145)

## 2023-02-14 LAB — CBG MONITORING, ED
Glucose-Capillary: 75 mg/dL (ref 70–99)
Glucose-Capillary: 85 mg/dL (ref 70–99)
Glucose-Capillary: 169 mg/dL — ABNORMAL HIGH (ref 70–99)
Glucose-Capillary: 119 mg/dL — ABNORMAL HIGH (ref 70–99)

## 2023-02-14 LAB — HIV ANTIBODY (ROUTINE TESTING W REFLEX): HIV Screen 4th Generation wRfx: NONREACTIVE

## 2023-02-14 MED ORDER — GABAPENTIN 400 MG PO CAPS
800.0000 mg | ORAL_CAPSULE | Freq: Four times a day (QID) | ORAL | Status: DC
  Administered 2023-02-14 – 2023-02-17 (×11): 800 mg via ORAL
  Filled 2023-02-14 (×13): qty 2

## 2023-02-14 MED ORDER — NAPROXEN 500 MG PO TABS
500.0000 mg | ORAL_TABLET | Freq: Two times a day (BID) | ORAL | Status: DC | PRN
  Administered 2023-02-14 – 2023-02-17 (×5): 500 mg via ORAL
  Filled 2023-02-14 (×6): qty 1

## 2023-02-14 MED ORDER — FUROSEMIDE 10 MG/ML IJ SOLN
40.0000 mg | Freq: Two times a day (BID) | INTRAMUSCULAR | Status: DC
  Administered 2023-02-14 – 2023-02-17 (×7): 40 mg via INTRAVENOUS
  Filled 2023-02-14 (×6): qty 4

## 2023-02-14 MED ORDER — NICOTINE 21 MG/24HR TD PT24
21.0000 mg | MEDICATED_PATCH | Freq: Every day | TRANSDERMAL | Status: DC
  Administered 2023-02-15 – 2023-02-17 (×3): 21 mg via TRANSDERMAL
  Filled 2023-02-14 (×4): qty 1

## 2023-02-14 MED ORDER — NICOTINE POLACRILEX 2 MG MT GUM: 2.0000 mg | CHEWING_GUM | OROMUCOSAL | Status: DC | PRN

## 2023-02-14 MED ORDER — DIPHENHYDRAMINE HCL 12.5 MG/5ML PO ELIX
12.5000 mg | ORAL_SOLUTION | Freq: Once | ORAL | Status: AC
  Administered 2023-02-14: 12.5 mg via ORAL
  Filled 2023-02-14: qty 5

## 2023-02-14 MED ORDER — DULOXETINE HCL 30 MG PO CPEP
30.0000 mg | ORAL_CAPSULE | Freq: Every day | ORAL | Status: DC
  Administered 2023-02-14 – 2023-02-17 (×4): 30 mg via ORAL
  Filled 2023-02-14 (×4): qty 1

## 2023-02-14 MED ORDER — METAXALONE 800 MG PO TABS
400.0000 mg | ORAL_TABLET | Freq: Two times a day (BID) | ORAL | Status: DC
  Administered 2023-02-14 – 2023-02-16 (×5): 400 mg via ORAL
  Filled 2023-02-14 (×6): qty 0.5

## 2023-02-14 NOTE — ED Notes (Signed)
Patient O2 saturation dropping into 60s while eating food, this RN checked on him and patient had removed nasal cannula. This RN informed patient that oxygen was low and he needed to put [place] back on, patient states it is in the way and he is breathing fine and does not want to put it back on. This RN asked him to try to eat with it on again, and patient placed [place] back in his nose but states "if it gets in the way I'm taking it off again." Patient's O2 came back up to low 90s with [place] back on.

## 2023-02-14 NOTE — ED Notes (Signed)
This RN went into pt room to answer call light. Pt very upset, screaming at this RN that his bed was wet. Pt stated "I am wet because you haven't been coming in here enough to empty my container. I am not a child, I shouldn't be wetting the bed like this. I am only this wet because you haven't been checking on me." Pt assured that this RN has been in the room at least once every hour to empty suction canister, that suction canister was only at 641m, and that he was not wet because the canister was overflowing, but that the bag must have a leak. Pt continuing to scream at this RN that I haven't been checking on him. Pt assured that this RN has been in the room many of times. Pt's entire bed stripped and changed, new chucks pads, brief, and new urine bag placed on pt after peri care provided. Pt provided with new warm blankets. In NAD at this time, c/o bilat leg cramping. NP messaged for c/o cramping. No further needs at this time.

## 2023-02-14 NOTE — Progress Notes (Signed)
  PROGRESS NOTE    Willie Krueger  [id number] DOB: 29-May-1956 DOA: 02/13/2023 PCP: Danae Orleans, MD  [id number]  LOS: 1 day   Brief hospital course:   Assessment & Plan: Willie Krueger is a 67 y.o. male with medical history significant for HTN, DM with PVD and past history of osteomyelitis, prior history of upper extremity DVT previously on Xarelto back in 2017, chronic pain from spinal stenosis on chronic opiates, brought in by EMS with altered mental status after he was found in his backyard unresponsive.  He was briefly bagged by EMS and then placed on nonrebreather for transport.    On arrival, O2 sat 82% on nonrebreather at 15 L and vitals otherwise unremarkable.  He was transitioned to BiPAP.  ABG showed a pH of 7.19 on arrival improving to 7.25 on BiPAP.  UDS was clean.   * Pulmonary edema (HCC) CTA chest showing pulmonary edema, cardiomegaly with prominent right atrial and right ventricular dilatation.  No prior hx of CHF. --started on IV lasix 40 BID Plan: --cont IV lasix 40 BID --Echo  Acute respiratory failure with hypoxia and hypercapnia (HCC) Acute respiratory acidosis --on 6L [place] this morning.  Hypoxia likely due to pulm edema.  No strong evidence of PNA, procal neg. Plan: --diurese --Continue supplemental O2 to keep sats >=90%, wean as tolerated  CAP, ruled out --WBC only mildly elevated at 12.  Procal neg.  No imaging finding to suggest PNA. --d/c ceftriaxone and azithromycin  Syncope and collapse Patient was found unresponsive in his backyard Suspect related to acute respiratory failure CT head nonacute  type 2 diabetes mellitus, well controlled --A1c 5.8.  not on insulin at home --hold oral hypoglycemics for now  Abnormal urinalysis Follow urine culture  Chronic pain secondary to cervical spinal stenosis --no current Rx for opioids --cont home gabapentin and metaxalone  HTN (hypertension) Continue lisinopril  Current smoker --wife reported  pt is a heavy everyday smoker --nicotine patch and gum   DVT prophylaxis: Lovenox SQ Code Status: Full code  Family Communication: wife updated on the phone today Level of care: Progressive Dispo:   The patient is from: home Anticipated d/c is to: home Anticipated d/c date is: 2-3 days   Subjective and Interval History:  Pt denied dyspnea, and kept saying he was leaving.   Objective: Vitals:   02/14/23 0900 02/14/23 1028 02/14/23 1220 02/14/23 1300  BP: 137/72 (!) 144/83 123/75   Pulse: 83  85 91  Resp: 16  10 17  $ Temp:      TempSrc:      SpO2: 99%  92% 95%  Weight:      Height:        Intake/Output Summary (Last 24 hours) at 02/14/2023 1515 Last data filed at 02/14/2023 1400 Gross per 24 hour  Intake --  Output 6600 ml  Net -6600 ml   Filed Weights   02/13/23 2100  Weight: 107.5 kg    Examination:   Constitutional: NAD, alert, appeared to have cognitive deficit, oriented to person and place HEENT: conjunctivae and lids normal, EOMI CV: No cyanosis.   RESP: normal respiratory effort, 6L sating 91% Extremities: edema in BLE Neuro: II - XII grossly intact.   Psych: irate mood and affect.     Data Reviewed: I have personally reviewed labs and imaging studies  Time spent: 50 minutes  Enzo Bi, MD [facility] If 7PM-7AM, please contact night-coverage 02/14/2023, 3:15 PM

## 2023-02-14 NOTE — ED Notes (Signed)
Pt stated he could not tolerate the high flow oxygen and that his nose was becoming too dry. O2sat in 96%. This RN placed him on regular [place] on 4L pt O2sat at 94%. Will continue to monitor.   Hospital bed ordered. Pt sitting in recliner with chair alarm on.

## 2023-02-14 NOTE — ED Notes (Signed)
This RN went into pt's room to answer his call bell again. Pt screaming at RN repeatedly "I feel better and want to go home, sitting here not being able to watch tv is unacceptable. I feel better and at this point, you people are just trying to make money off of me. I can't even have a TV remote? This is absolutely unacceptable. You just expect me to lay here and look at the ceiling? You won't take anymore of my money, tell them I am going home in the morning. This is unacceptable." Pt assured that this RN looked in several other rooms for a spare TV remote, was unable to find any but was notified by the unit secretary that more were ordered today on day shift. Pt irate, continuing to scream at this RN that not having a TV remote was unacceptable and that he would be leaving in the morning. Will notify day shift that pt would like to leave later in the morning upon his wife's arrival. Pt in NAD at this time, call bell within reach.

## 2023-02-14 NOTE — ED Notes (Signed)
Pt sleeping and tolerating BiPAP. This RN will continue to monitor.

## 2023-02-14 NOTE — ED Notes (Signed)
Pt taken off bipap and dropped to mid 70s% on room air. RT called and placed pt on high flow [place] 6L. Pt satO2 at 91%. Pt keeps requesting [place] to call his wife to take him home. States "I will leave even if you don't let me". Pt talking circular but easy to direct. Provider at bedside and stated if wife wants to take him AMA she can but pt not oriented enough to sign AMA. Pt eating breakfast now.

## 2023-02-15 DIAGNOSIS — J810 Acute pulmonary edema: Secondary | ICD-10-CM | POA: Diagnosis not present

## 2023-02-15 LAB — CBC
HCT: 40.7 % (ref 39.0–52.0)
Hemoglobin: 13.1 g/dL (ref 13.0–17.0)
MCH: 31 pg (ref 26.0–34.0)
MCHC: 32.2 g/dL (ref 30.0–36.0)
MCV: 96.4 fL (ref 80.0–100.0)
Platelets: 153 10*3/uL (ref 150–400)
RBC: 4.22 MIL/uL (ref 4.22–5.81)
RDW: 17.3 % — ABNORMAL HIGH (ref 11.5–15.5)
WBC: 8.4 10*3/uL (ref 4.0–10.5)
nRBC: 0 % (ref 0.0–0.2)

## 2023-02-15 LAB — URINE CULTURE: Culture: >=100000 — AB

## 2023-02-15 LAB — MAGNESIUM: Magnesium: 2.2 mg/dL (ref 1.7–2.4)

## 2023-02-15 LAB — BASIC METABOLIC PANEL
Anion gap: 6 (ref 5–15)
BUN: 25 mg/dL — ABNORMAL HIGH (ref 8–23)
CO2: 28 mmol/L (ref 22–32)
Calcium: 8.8 mg/dL — ABNORMAL LOW (ref 8.9–10.3)
Chloride: 102 mmol/L (ref 98–111)
Creatinine, Ser: 1.21 mg/dL (ref 0.61–1.24)
GFR, Estimated: >60 mL/min (ref >60)
Glucose, Bld: 160 mg/dL — ABNORMAL HIGH (ref 70–99)
Potassium: 3.7 mmol/L (ref 3.5–5.1)
Sodium: 136 mmol/L (ref 135–145)

## 2023-02-15 LAB — GLUCOSE, CAPILLARY
Glucose-Capillary: 140 mg/dL — ABNORMAL HIGH (ref 70–99)
Glucose-Capillary: 125 mg/dL — ABNORMAL HIGH (ref 70–99)
Glucose-Capillary: 247 mg/dL — ABNORMAL HIGH (ref 70–99)
Glucose-Capillary: 127 mg/dL — ABNORMAL HIGH (ref 70–99)

## 2023-02-15 MED ORDER — DIPHENHYDRAMINE HCL 25 MG PO CAPS
25.0000 mg | ORAL_CAPSULE | Freq: Once | ORAL | Status: AC
  Administered 2023-02-15: 25 mg via ORAL
  Filled 2023-02-15: qty 1

## 2023-02-15 NOTE — Progress Notes (Signed)
Pt. Went into a irritable state. Cussing at staff and yelling loudly to the point where another nurse called security. Pt very angry and insisting that we let him go home. Wife called and she is now here bedside by the point of me being able to write this note. He has refused oxygen therapy even though his saturations are in the high 60s. Currently wife is trying to console patient and we will see how successful she is and this will determine where we are going to proceed. MD aware and this is a repeating situation with this patient as she stated.

## 2023-02-15 NOTE — Progress Notes (Signed)
Per Mr. Tosti wife, please do not wake him up for VS if he is resting with his eyes closed during rounds.

## 2023-02-15 NOTE — Progress Notes (Signed)
PROGRESS NOTE    Willie Krueger  [id number] DOB: May 22, 1956 DOA: 02/13/2023 PCP: Danae Orleans, MD  [id number]  LOS: 2 days   Brief hospital course:   Assessment & Plan: Willie Krueger is a 67 y.o. male with medical history significant for HTN, DM with PVD and past history of osteomyelitis, prior history of upper extremity DVT previously on Xarelto back in 2017, chronic pain from spinal stenosis on chronic opiates, brought in by EMS with altered mental status after he was found in his backyard unresponsive.  He was briefly bagged by EMS and then placed on nonrebreather for transport.    On arrival, O2 sat 82% on nonrebreather at 15 L and vitals otherwise unremarkable.  He was transitioned to BiPAP.  ABG showed a pH of 7.19 on arrival improving to 7.25 on BiPAP.  UDS was clean.   * Pulmonary edema (HCC) 2/2 Acute diastolic CHF exacerbation CTA chest showing pulmonary edema, cardiomegaly with prominent right atrial and right ventricular dilatation.  No prior hx of CHF.  Current Echo showed grade 2 DD, unknown chronicity.   --started on IV lasix 40 BID Plan: --cont IV lasix 40 BID  Acute respiratory failure with hypoxia and hypercapnia (HCC) Acute respiratory acidosis --Hypoxia likely due to pulm edema.  No strong evidence of PNA, procal neg. --O2 sats 68% when pt took off his supplemental oxygen.  Currently needs 6-7L. Plan: --cont diuresis --Continue supplemental O2 to keep sats >=90%, wean as tolerated  CAP, ruled out --WBC only mildly elevated at 12.  Procal neg.  No imaging finding to suggest PNA. --d/c'ed ceftriaxone and azithromycin  Syncope and collapse Patient was found unresponsive in his backyard Suspect related to acute respiratory failure CT head nonacute  type 2 diabetes mellitus, well controlled --A1c 5.8.  not on insulin at home --hold oral hypoglycemics for now --ACHS and SSI  Abnormal urinalysis Follow urine culture  Chronic pain secondary to  cervical spinal stenosis --no current Rx for opioids --cont home gabapentin and metaxalone  HTN (hypertension) Continue lisinopril  Current smoker --wife reported pt is a heavy everyday smoker --nicotine patch and gum   DVT prophylaxis: Lovenox SQ Code Status: Full code  Family Communication: wife updated at the bedside today Level of care: Progressive Dispo:   The patient is from: home Anticipated d/c is to: home Anticipated d/c date is: 2-3 days   Subjective and Interval History:  Pt again threatened to leave AMA this morning.  Pt did not seem to feel dyspnea when O2 sats dropped.    Wife came and was able to get pt to cooperate with getting IV lasix and nicotine replacement therapy.   Objective: Vitals:   02/15/23 0414 02/15/23 0443 02/15/23 0751 02/15/23 1108  BP: 117/68 106/64 108/71   Pulse: 75 71 84   Resp: 17 16 20   $ Temp: (!) 97.5 F (36.4 C) 97.7 F (36.5 C) 97.9 F (36.6 C)   TempSrc:      SpO2: 96% 96% 97% (S) (!) 68%  Weight:      Height:        Intake/Output Summary (Last 24 hours) at 02/15/2023 1426 Last data filed at 02/15/2023 0925 Gross per 24 hour  Intake 3 ml  Output 900 ml  Net -897 ml   Filed Weights   02/13/23 2100  Weight: 107.5 kg    Examination:   Constitutional: NAD, alert, oriented to person and place HEENT: conjunctivae and lids normal, EOMI CV: No cyanosis.  Neuro: II - XII grossly intact.   Psych: irate mood and affect.     Data Reviewed: I have personally reviewed labs and imaging studies  Time spent: 35 minutes  Enzo Bi, MD [facility] If 7PM-7AM, please contact night-coverage 02/15/2023, 2:26 PM

## 2023-02-15 NOTE — Progress Notes (Signed)
RN revisited TELE monitor order with Willie Krueger and spouse at bedside. Willie Krueger declined to have TELE replaced.  HE also requested benadryl w/ tylenol to aid with sleep. Attending MD called.

## 2023-02-16 DIAGNOSIS — J810 Acute pulmonary edema: Secondary | ICD-10-CM | POA: Diagnosis not present

## 2023-02-16 LAB — BASIC METABOLIC PANEL
Anion gap: 7 (ref 5–15)
BUN: 31 mg/dL — ABNORMAL HIGH (ref 8–23)
CO2: 30 mmol/L (ref 22–32)
Calcium: 8.8 mg/dL — ABNORMAL LOW (ref 8.9–10.3)
Chloride: 102 mmol/L (ref 98–111)
Creatinine, Ser: 1.04 mg/dL (ref 0.61–1.24)
GFR, Estimated: >60 mL/min (ref >60)
Glucose, Bld: 148 mg/dL — ABNORMAL HIGH (ref 70–99)
Potassium: 3.7 mmol/L (ref 3.5–5.1)
Sodium: 139 mmol/L (ref 135–145)

## 2023-02-16 LAB — GLUCOSE, CAPILLARY
Glucose-Capillary: 150 mg/dL — ABNORMAL HIGH (ref 70–99)
Glucose-Capillary: 228 mg/dL — ABNORMAL HIGH (ref 70–99)
Glucose-Capillary: 145 mg/dL — ABNORMAL HIGH (ref 70–99)
Glucose-Capillary: 244 mg/dL — ABNORMAL HIGH (ref 70–99)
Glucose-Capillary: 234 mg/dL — ABNORMAL HIGH (ref 70–99)

## 2023-02-16 LAB — CBC
HCT: 42 % (ref 39.0–52.0)
Hemoglobin: 13.2 g/dL (ref 13.0–17.0)
MCH: 30.4 pg (ref 26.0–34.0)
MCHC: 31.4 g/dL (ref 30.0–36.0)
MCV: 96.8 fL (ref 80.0–100.0)
Platelets: 149 10*3/uL — ABNORMAL LOW (ref 150–400)
RBC: 4.34 MIL/uL (ref 4.22–5.81)
RDW: 17.1 % — ABNORMAL HIGH (ref 11.5–15.5)
WBC: 7.1 10*3/uL (ref 4.0–10.5)
nRBC: 0 % (ref 0.0–0.2)

## 2023-02-16 LAB — MAGNESIUM: Magnesium: 2.4 mg/dL (ref 1.7–2.4)

## 2023-02-16 MED ORDER — DIPHENHYDRAMINE HCL 25 MG PO CAPS
50.0000 mg | ORAL_CAPSULE | Freq: Every day | ORAL | Status: DC
  Administered 2023-02-16: 50 mg via ORAL
  Filled 2023-02-16: qty 2

## 2023-02-16 MED ORDER — ACETAMINOPHEN 325 MG PO TABS
650.0000 mg | ORAL_TABLET | Freq: Every day | ORAL | Status: DC
  Administered 2023-02-16: 650 mg via ORAL
  Filled 2023-02-16: qty 2

## 2023-02-16 MED ORDER — METAXALONE 800 MG PO TABS
800.0000 mg | ORAL_TABLET | Freq: Every day | ORAL | Status: DC
  Filled 2023-02-16: qty 1

## 2023-02-16 NOTE — Discharge Instructions (Signed)

## 2023-02-16 NOTE — Consult Note (Addendum)
   Heart Failure Nurse Navigator Note    HFpEF 55-60%.  2 diastolic dysfunction.  Right ventricular cavity is mildly enlarged.  Normal right ventricular systolic function.  Mild left atrial enlargement.  Mild mitral regurgitation.  He was brought to the hospital after he was found unresponsive in his backyard.  TA of the chest revealed groundglass airspace disease along with pulmonary edema.  BNP was 540.  Comorbidities:  Diabetes Hyperlipidemia Hypertension Osteoarthritis  Medications:  Aspirin 81 mg daily Atorvastatin 40 mg daily Furosemide 40 mg IV 2 times a day Lisinopril 40 mg daily NicoDerm patch 21 mg daily  Labs:  Sodium 139, potassium 3.7, chloride 102, CO2 30, BUN 31, creatinine 1.04, estimated GFR greater than 60. Weight is not documented Intake 120 mL Output 1750 mL   When initially meeting with patient and wife patient was asleep.  I left the education materials with the wife and told her I would give him hour to an hour and a half to nap and would return.  She is in agreement.  Return the patient was sitting up at the bedside just having finished lunch.  The wife states that she has read through all the education materials and her first question me was what kind of heart failure does he have?  Explained diastolic dysfunction.  She voices understanding.  She states that she realizes that he will have to remove the saltshaker from the table and to not drink as much as he has in the past.  She states that when she cooks its all fresh and he very seldom eats processed meats.  She states that since COVID they have not eaten in restaurants.  Instructed on daily weights and what to report.  They do not have a scale for weighing himself. Will supply with one from the clinic. Went over changes and symptoms and what to report.  It aware of follow-up in the outpatient heart failure clinic.  He has an appointment on February 27 at 8:30 in the morning.  He has a 10% no-show  ratio which is 8 out of 82 appointments.  You were given the living with heart failure teaching booklet, zone magnet, info on low-sodium and heart failure along with weight chart.  Will continue to follow along.  Pricilla Riffle RN [facility]

## 2023-02-16 NOTE — Progress Notes (Signed)
PROGRESS NOTE    Willie Krueger  [id number] DOB: [birth year] DOA: 02/13/2023 PCP: Willie Orleans, MD  [id number]  LOS: 3 days   Brief hospital course:   Assessment & Plan: [name] is a 67 y.o. male with medical history significant for HTN, DM with PVD and past history of osteomyelitis, prior history of upper extremity DVT previously on Xarelto back in 2017, chronic pain from spinal stenosis on chronic opiates, brought in by EMS with altered mental status after he was found in his backyard unresponsive.  He was briefly bagged by EMS and then placed on nonrebreather for transport.    On arrival, O2 sat 82% on nonrebreather at 15 L and vitals otherwise unremarkable.  He was transitioned to BiPAP.  ABG showed a pH of 7.19 on arrival improving to 7.25 on BiPAP.  UDS was clean.   * Pulmonary edema (HCC) 2/2 Acute diastolic CHF exacerbation CTA chest showing pulmonary edema, cardiomegaly with prominent right atrial and right ventricular dilatation.  No prior hx of CHF.  Current Echo showed grade 2 DD, unknown chronicity.   --started on IV lasix 40 BID Plan: --cont IV lasix 40 BID  Acute respiratory failure with hypoxia and hypercapnia (HCC) Acute respiratory acidosis --Hypoxia likely due to pulm edema.  No strong evidence of PNA, procal neg. --O2 sats 68% when pt took off his supplemental oxygen.  Currently on 5L. Plan: --cont IV lasix 40 BID --Continue supplemental O2 to keep sats >=90%, wean as tolerated  CAP, ruled out --WBC only mildly elevated at 12.  Procal neg.  No imaging finding to suggest PNA. --d/c'ed ceftriaxone and azithromycin  Syncope and collapse Patient was found unresponsive in his backyard Suspect related to acute respiratory failure CT head nonacute  type 2 diabetes mellitus, well controlled --A1c 5.8.  not on insulin at home --hold oral hypoglycemics for now --ACHS and SSI  Asymptomatic bacteriuria  --urine cx pos for E coli, however, pt denied  dysuria or any urinary symptoms. --no need to treat  Chronic pain secondary to cervical spinal stenosis --no current Rx for opioids --cont home gabapentin and metaxalone  HTN (hypertension) Continue lisinopril  Current smoker --wife reported pt is a heavy everyday smoker --nicotine patch and gum   DVT prophylaxis: Lovenox SQ Code Status: Full code  Family Communication: wife updated at the bedside today Level of care: Progressive Dispo:   The patient is from: home Anticipated d/c is to: home Anticipated d/c date is: 1-2 days   Subjective and Interval History:  Pt was much calmer and cooperative this morning with his wife by his side.  Pt complained of not sleeping well last night even though he was given Tylenol PM equivalent.  Denied dysuria.   Objective: Vitals:   02/15/23 2337 02/16/23 0849 02/16/23 1132 02/16/23 1607  BP: (!) 113/59 118/85 117/71 127/77  Pulse: 78 92 87 94  Resp: 18 18 20 18  $ Temp: 99.1 F (37.3 C) 97.8 F (36.6 C) 98.2 F (36.8 C) 98.9 F (37.2 C)  TempSrc: Oral Oral Oral Oral  SpO2: 99% 97% 95% 96%  Weight:      Height:        Intake/Output Summary (Last 24 hours) at 02/16/2023 1723 Last data filed at 02/16/2023 1043 Gross per 24 hour  Intake 220 ml  Output 1700 ml  Net -1480 ml   Filed Weights   02/13/23 2100  Weight: 107.5 kg    Examination:   Constitutional: NAD, AAOx3, sitting on edge  of bed HEENT: conjunctivae and lids normal, EOMI CV: No cyanosis.   RESP: normal respiratory effort, on 5L Extremities: edema in BLE improved SKIN: warm, dry Neuro: II - XII grossly intact.     Data Reviewed: I have personally reviewed labs and imaging studies  Time spent: 35 minutes  Enzo Bi, MD [facility] If 7PM-7AM, please contact night-coverage 02/16/2023, 5:23 PM

## 2023-02-16 NOTE — Progress Notes (Signed)
Nutrition Brief Note  RD pulled to chart secondary to new CHF.   Wt Readings from Last 15 Encounters:  02/13/23 107.5 kg  02/14/22 90.7 kg  02/04/22 90.7 kg  05/28/18 108.4 kg  06/01/17 103.9 kg  04/20/17 107.5 kg  03/16/17 103.9 kg  02/09/17 103.9 kg  02/02/17 103.9 kg  12/05/16 103.9 kg  11/21/16 102.1 kg  11/13/16 103.9 kg  10/16/16 100.2 kg  08/11/16 107.5 kg  07/29/16 107.5 kg   Pt with medical history significant for HTN, DM with PVD and past history of osteomyelitis, prior history of upper extremity DVT previously on Xarelto back in 2017, chronic pain from spinal stenosis on chronic opiates, brought in with altered mental status after he was found in his backyard unresponsive.   Pt admitted with pulmonary edema and new CHF.   Reviewed I/O's: -1.6 L x 24 hours and -8.2 L since admission  UOP: 1.8 L x 24 hours  Pt has been visited by heart failure navigator. Pt has been educated on basic principles of heart failure management. Pt will be provided a scale for home use.   Attempted to visit pt, but multiple providers in room at time of visit.   RD provided "Heart Healthy, Consistent Carbohydrate Nutrition Therapy" handout from [facility] Nutrition Care Manual; attached to AVS/ discharge summary.   Current diet order is heart healthy/ carb modified, patient is consuming approximately 100% of meals at this time. Labs and medications reviewed.   No nutrition interventions warranted at this time. If nutrition issues arise, please consult RD.   Loistine Chance, RD, LDN, [place] Registered Dietitian II Certified Diabetes Care and Education Specialist Please refer to [facility] for RD and/or RD on-call/weekend/after hours pager

## 2023-02-17 DIAGNOSIS — J810 Acute pulmonary edema: Secondary | ICD-10-CM | POA: Diagnosis not present

## 2023-02-17 LAB — CBC
HCT: 42.8 % (ref 39.0–52.0)
Hemoglobin: 13.4 g/dL (ref 13.0–17.0)
MCH: 30.6 pg (ref 26.0–34.0)
MCHC: 31.3 g/dL (ref 30.0–36.0)
MCV: 97.7 fL (ref 80.0–100.0)
Platelets: 148 10*3/uL — ABNORMAL LOW (ref 150–400)
RBC: 4.38 MIL/uL (ref 4.22–5.81)
RDW: 16.8 % — ABNORMAL HIGH (ref 11.5–15.5)
WBC: 7.2 10*3/uL (ref 4.0–10.5)
nRBC: 0 % (ref 0.0–0.2)

## 2023-02-17 LAB — BASIC METABOLIC PANEL
Anion gap: 6 (ref 5–15)
BUN: 34 mg/dL — ABNORMAL HIGH (ref 8–23)
CO2: 31 mmol/L (ref 22–32)
Calcium: 8.8 mg/dL — ABNORMAL LOW (ref 8.9–10.3)
Chloride: 102 mmol/L (ref 98–111)
Creatinine, Ser: 1.09 mg/dL (ref 0.61–1.24)
GFR, Estimated: >60 mL/min (ref >60)
Glucose, Bld: 144 mg/dL — ABNORMAL HIGH (ref 70–99)
Potassium: 3.9 mmol/L (ref 3.5–5.1)
Sodium: 139 mmol/L (ref 135–145)

## 2023-02-17 LAB — GLUCOSE, CAPILLARY: Glucose-Capillary: 169 mg/dL — ABNORMAL HIGH (ref 70–99)

## 2023-02-17 LAB — MAGNESIUM: Magnesium: 2.4 mg/dL (ref 1.7–2.4)

## 2023-02-17 MED ORDER — FUROSEMIDE 40 MG PO TABS: 40.0000 mg | ORAL_TABLET | Freq: Two times a day (BID) | ORAL | 0 refills | Status: AC

## 2023-02-17 NOTE — Progress Notes (Signed)
   Heart Failure Nurse Navigator Note  Met with patient and his wife who was at the bedside.  He is signing out AMA, had previously been very upset and shouting at staff.  At the time of my interview he is sitting quietly in chair at bedside and is pleasant and appropriate states that he is going home as what is being done here he can do at home.  Given went over the importance of daily weights, fluid and sodium restriction.  Still stressed keeping his appointment with the outpatient heart failure clinic.  He voices understanding and had no further questions.  Pricilla Riffle RN [facility]

## 2023-02-17 NOTE — Discharge Summary (Addendum)
Physician [facility] Discharge Summary   Willie Krueger  male DOB: Sep 25, 1956  [id number]  PCP: Danae Orleans, MD  Admit date: 02/13/2023 AMA Discharge date: 02/17/2023  CODE STATUS: Full code   Hospital Course:  For full details, please see H&P, progress notes, consult notes and ancillary notes.  Briefly,  Willie Krueger is a 67 y.o. male with medical history significant for HTN, DM with PVD and past history of osteomyelitis, prior history of upper extremity DVT previously on Xarelto back in 2017, chronic pain from spinal stenosis on chronic opiates, brought in by EMS with altered mental status after he was found in his backyard unresponsive.  He was briefly bagged by EMS and then placed on nonrebreather for transport.     On arrival, O2 sat 82% on nonrebreather at 15 L and vitals otherwise unremarkable.  He was transitioned to BiPAP.  ABG showed a pH of 7.19 on arrival improving to 7.25 on BiPAP.  UDS was clean.   * Pulmonary edema (HCC) 2/2 Acute diastolic CHF exacerbation CTA chest showing pulmonary edema, cardiomegaly with prominent right atrial and right ventricular dilatation.  No prior hx of CHF.  Current Echo showed grade 2 DD, unknown chronicity.   --pt has oral lasix 20 mg in his home med list, however, unclear dose and whether he was taking it PTA. --started on IV lasix 40 BID, with good urine output and improvement in oxygen requirement.   --Every day, pt threatened to leave AMA, and wife was able to convince him to stay until today, when pt and wife got into a shouting match and wife could not keep pt from leaving.  Since pt is still needing at least 2L supplemental oxygen and still need aggressive diuresis, pt's leaving is considered AMA. --I did prescribe oral lasix 40 mg BID for 14 days for pt to continue diurese at home, though wife was aware of the limitation of not being able to monitor urine output, kidney function, electrolytes, etc, if not inpatient. --pt was advised  to keep his appointment with CHF clinic on 02/24/23.  CHF nurse navigator went over appointment and info with pt and wife before pt left.   Acute respiratory failure with hypoxia and hypercapnia (HCC) Acute respiratory acidosis --Hypoxia due to pulm edema.  No strong evidence of PNA, procal neg. --O2 sats 68% when pt took off his supplemental oxygen.  After aggressive diureses, oxygen requirement went down to 2L on the day of pt leaving AMA.  Since the hypoxia is acute and new, and pt was expected to be weaned off oxygen, pt did not qualify for home oxygen.  This was explained to pt and wife.   Acute metabolic encephalopathy due to above respiratory issues  CAP, ruled out --WBC only mildly elevated at 12.  Procal neg.  No imaging finding to suggest PNA. --d/c'ed ceftriaxone and azithromycin   Syncope and collapse Patient was found unresponsive in his backyard Suspect related to acute respiratory failure CT head nonacute   type 2 diabetes mellitus, well controlled --A1c 5.8.  not on insulin at home --resume home glimepiride and metformin after discharge.   Asymptomatic bacteriuria  --urine cx pos for E coli, however, pt denied dysuria or any urinary symptoms. --no need to treat   Chronic pain secondary to cervical spinal stenosis --no current Rx for opioids --cont home gabapentin and metaxalone   HTN (hypertension) Continue lisinopril   Current smoker --wife reported pt is a heavy everyday smoker --nicotine patch and  gum  Obesity, BMI: 31.27 kg/m     Unless noted above, medications under "STOP" list are ones pt was not taking PTA.  Discharge Diagnoses:  Principal Problem:   Flash pulmonary edema (HCC) Active Problems:   Acute respiratory failure with hypoxia and hypercapnia (HCC)   Acute respiratory acidosis (HCC)   Acute CHF (congestive heart failure) (HCC)   CAP (community acquired pneumonia)   Syncope and collapse   Uncontrolled type 2 diabetes mellitus with  hyperglycemia, without long-term current use of insulin (HCC)   HTN (hypertension)   Long term prescription opiate use   Chronic pain syndrome   Abnormal urinalysis   History of DVT (deep vein thrombosis)   30 Day Unplanned Readmission Risk Score    Flowsheet Row ED to Hosp-Admission (Current) from 02/13/2023 in [place] [facility]  30 Day Unplanned Readmission Risk Score (%) 15.18 Filed at 02/17/2023 0801       This score is the patient's risk of an unplanned readmission within 30 days of being discharged (0 -100%). The score is based on dignosis, age, lab data, medications, orders, and past utilization.   Low:  0-14.9   Medium: 15-21.9   High: 22-29.9   Extreme: 30 and above         Discharge Instructions:  Allergies as of 02/17/2023       Reactions   Piperacillin-tazobactam In Dex Rash   Cleocin [clindamycin] Rash   Clindamycin Phosphate Rash   Sulfa Antibiotics Rash   Zosyn [piperacillin Sod-tazobactam So] Rash        Medication List     STOP taking these medications    docusate sodium 100 MG capsule Commonly known as: Colace       TAKE these medications    acetaminophen 650 MG CR tablet Commonly known as: TYLENOL Take 1,300 mg by mouth every 4 (four) hours as needed for pain.   aspirin EC 81 MG tablet Take 81 mg by mouth daily.   atorvastatin 40 MG tablet Commonly known as: LIPITOR Take 40 mg by mouth daily.   cetirizine 10 MG tablet Commonly known as: ZYRTEC Take 10 mg by mouth daily.   DULoxetine 30 MG capsule Commonly known as: CYMBALTA Take 30 mg by mouth daily.   furosemide 40 MG tablet Commonly known as: LASIX Take 1 tablet (40 mg total) by mouth 2 (two) times daily for 14 days. What changed:  medication strength how much to take when to take this   gabapentin 800 MG tablet Commonly known as: NEURONTIN Take 800 mg by mouth 4 (four) times daily.   glimepiride 2 MG tablet Commonly known as: AMARYL Take 2 mg by  mouth daily with breakfast.   lisinopril 40 MG tablet Commonly known as: ZESTRIL Take 40 mg by mouth daily.   metaxalone 400 MG tablet Commonly known as: SKELAXIN Take 400 mg by mouth 2 (two) times daily.   metFORMIN 500 MG 24 hr tablet Commonly known as: GLUCOPHAGE-XR Take 1,500 mg by mouth daily with breakfast.          Allergies  Allergen Reactions   Piperacillin-Tazobactam In Dex Rash   Cleocin [Clindamycin] Rash   Clindamycin Phosphate Rash   Sulfa Antibiotics Rash   Zosyn [Piperacillin Sod-Tazobactam So] Rash     The results of significant diagnostics from this hospitalization (including imaging, microbiology, ancillary and laboratory) are listed below for reference.   Consultations:   Procedures/Studies: ECHOCARDIOGRAM COMPLETE  Result Date: 02/14/2023    ECHOCARDIOGRAM REPORT  Patient Name:   DIOVANNI HOOFNAGLE Date of Exam: 02/14/2023 Medical Rec #:  [id number]      Height:       73.0 in Accession #:    [id number]     Weight:       237.0 lb Date of Birth:  December 09, 1956      BSA:          2.313 m Patient Age:    31 years       BP:           144/74 mmHg Patient Gender: M              HR:           88 bpm. Exam Location:  [facility] Procedure: 2D Echo, Cardiac Doppler and Color Doppler Indications:     CHF-Acute Diastolic [id number]  History:         Patient has no prior history of Echocardiogram examinations.                  CHF, Signs/Symptoms:Murmur; Risk Factors:Current Smoker,                  Hypertension, Dyslipidemia and Diabetes.  Sonographer:     Wilkie Aye RVT RCS Referring Phys:  [id number] Athena Masse Diagnosing Phys: Kate Sable MD  Sonographer Comments: Suboptimal apical window. Image acquisition challenging due to uncooperative patient and Patient became agitated during exam; could not administer Definity. IMPRESSIONS  1. Left ventricular ejection fraction, by estimation, is 55 to 60%. The left ventricle has normal function. The left ventricle has no regional wall  motion abnormalities. Left ventricular diastolic parameters are consistent with Grade II diastolic dysfunction (pseudonormalization).  2. Right ventricular systolic function is normal. The right ventricular size is moderately enlarged.  3. Left atrial size was mildly dilated.  4. The mitral valve is normal in structure. Mild mitral valve regurgitation.  5. The aortic valve is tricuspid. Aortic valve regurgitation is not visualized.  6. The inferior vena cava is normal in size with <50% respiratory variability, suggesting right atrial pressure of 8 mmHg. FINDINGS  Left Ventricle: Left ventricular ejection fraction, by estimation, is 55 to 60%. The left ventricle has normal function. The left ventricle has no regional wall motion abnormalities. The left ventricular internal cavity size was normal in size. There is  no left ventricular hypertrophy. Left ventricular diastolic parameters are consistent with Grade II diastolic dysfunction (pseudonormalization). Right Ventricle: The right ventricular size is moderately enlarged. No increase in right ventricular wall thickness. Right ventricular systolic function is normal. Left Atrium: Left atrial size was mildly dilated. Right Atrium: Right atrial size was normal in size. Pericardium: There is no evidence of pericardial effusion. Mitral Valve: The mitral valve is normal in structure. Mild mitral valve regurgitation. Tricuspid Valve: The tricuspid valve is normal in structure. Tricuspid valve regurgitation is mild. Aortic Valve: The aortic valve is tricuspid. Aortic valve regurgitation is not visualized. Aortic valve mean gradient measures 3.0 mmHg. Aortic valve peak gradient measures 6.2 mmHg. Aortic valve area, by VTI measures 3.37 cm. Pulmonic Valve: The pulmonic valve was normal in structure. Pulmonic valve regurgitation is trivial. Aorta: The aortic root is normal in size and structure. Venous: The inferior vena cava is normal in size with less than 50% respiratory  variability, suggesting right atrial pressure of 8 mmHg. IAS/Shunts: No atrial level shunt detected by color flow Doppler.  LEFT VENTRICLE PLAX 2D LVIDd:  4.80 cm   Diastology LVIDs:         3.40 cm   LV e' medial:    7.05 cm/s LV PW:         1.30 cm   LV E/e' medial:  22.7 LV IVS:        1.00 cm   LV e' lateral:   7.95 cm/s LVOT diam:     2.20 cm   LV E/e' lateral: 20.1 LV SV:         71 LV SV Index:   31 LVOT Area:     3.80 cm  RIGHT VENTRICLE            IVC RV Basal diam:  4.60 cm    IVC diam: 2.00 cm RV S prime:     9.15 cm/s TAPSE (M-mode): 1.6 cm LEFT ATRIUM           Index        RIGHT ATRIUM           Index LA diam:      4.80 cm 2.08 cm/m   RA Area:     17.80 cm LA Vol (A4C): 80.3 ml 34.72 ml/m  RA Volume:   58.10 ml  25.12 ml/m  AORTIC VALVE                    PULMONIC VALVE AV Area (Vmax):    3.28 cm     PV Vmax:       0.94 m/s AV Area (Vmean):   3.73 cm     PV Peak grad:  3.6 mmHg AV Area (VTI):     3.37 cm AV Vmax:           125.00 cm/s AV Vmean:          81.300 cm/s AV VTI:            0.211 m AV Peak Grad:      6.2 mmHg AV Mean Grad:      3.0 mmHg LVOT Vmax:         108.00 cm/s LVOT Vmean:        79.700 cm/s LVOT VTI:          0.187 m LVOT/AV VTI ratio: 0.89  AORTA Ao Root diam: 3.70 cm MITRAL VALVE                TRICUSPID VALVE MV Area (PHT): 2.78 cm     TV Peak grad:   20.6 mmHg MV Decel Time: 273 msec     TV Vmax:        2.27 m/s MV E velocity: 160.00 cm/s MV A velocity: 141.00 cm/s  SHUNTS MV E/A ratio:  1.13         Systemic VTI:  0.19 m                             Systemic Diam: 2.20 cm Kate Sable MD Electronically signed by Kate Sable MD Signature Date/Time: 02/14/2023/3:11:36 PM    Final    CT Angio Chest PE W and/or Wo Contrast  Result Date: 02/13/2023 CLINICAL DATA:  Found unresponsive status post resuscitation EXAM: CT ANGIOGRAPHY CHEST WITH CONTRAST TECHNIQUE: Multidetector CT imaging of the chest was performed using the standard protocol during bolus  administration of intravenous contrast. Multiplanar CT image reconstructions and MIPs were obtained to evaluate the vascular anatomy. RADIATION DOSE REDUCTION: This exam was performed  according to the departmental dose-optimization program which includes automated exposure control, adjustment of the mA and/or kV according to patient size and/or use of iterative reconstruction technique. CONTRAST:  43m OMNIPAQUE IOHEXOL 350 MG/ML SOLN COMPARISON:  02/13/2023, 11/21/2016 FINDINGS: Cardiovascular: This is a technically adequate evaluation of the pulmonary vasculature. No filling defects or pulmonary emboli. Mild cardiomegaly, with prominent dilatation of the right atrium and right ventricle. No pericardial effusion. Atherosclerosis of the coronary vasculature greatest in the LAD and circumflex distribution. Normal caliber of the thoracic aorta. Stable atherosclerosis of the aortic arch. Mediastinum/Nodes: Stable heterogeneous large mint of the thyroid. Trachea and esophagus are unremarkable. No pathologic mediastinal, hilar, or axillary adenopathy. Lungs/Pleura: Scattered hypoventilatory changes within the dependent lower lobes. There is upper lobe predominant interlobular septal thickening and ground-glass airspace disease, most consistent with developing pulmonary edema. No effusion or pneumothorax. Central airways are patent. Upper Abdomen: No acute abnormality. Musculoskeletal: No acute or destructive bony lesions. Reconstructed images demonstrate no additional findings. Review of the MIP images confirms the above findings. IMPRESSION: 1. No evidence of pulmonary embolus. 2. Upper lobe predominant interlobular septal thickening and ground-glass airspace disease, consistent with pulmonary edema. 3. Cardiomegaly, with prominent right atrial and right ventricular dilation. No pericardial effusion. 4. Aortic Atherosclerosis (ICD10-I70.0). Coronary artery atherosclerosis. 5. Continued heterogeneous enlargement of the  thyroid. Recommend nonemergent outpatient thyroid ultrasound if not previously performed. (Ref: J Am Coll Radiol. 2015 Feb;12(2): 143-50). Electronically Signed   By: MRanda NgoM.D.   On: 02/13/2023 20:21   CT HEAD WO CONTRAST (5MM)  Result Date: 02/13/2023 CLINICAL DATA:  Mental status changes. Unknown cause. Recurrent episodes of unresponsiveness. EXAM: CT HEAD WITHOUT CONTRAST TECHNIQUE: Contiguous axial images were obtained from the base of the skull through the vertex without intravenous contrast. RADIATION DOSE REDUCTION: This exam was performed according to the departmental dose-optimization program which includes automated exposure control, adjustment of the mA and/or kV according to patient size and/or use of iterative reconstruction technique. COMPARISON:  None Available. FINDINGS: Brain: There is no evidence for acute hemorrhage, hydrocephalus, mass lesion, or abnormal extra-axial fluid collection. No definite CT evidence for acute infarction. Diffuse loss of parenchymal volume is consistent with atrophy. Vascular: No hyperdense vessel or unexpected calcification. Skull: No evidence for fracture. No worrisome lytic or sclerotic lesion. Sinuses/Orbits: The visualized paranasal sinuses and mastoid air cells are clear. Visualized portions of the globes and intraorbital fat are unremarkable. Other: None. IMPRESSION: 1. No acute intracranial abnormality. 2. Atrophy with chronic small vessel ischemic disease. Electronically Signed   By: EMisty StanleyM.D.   On: 02/13/2023 17:15   DG Chest Port 1 View  Result Date: 02/13/2023 CLINICAL DATA:  Questionable sepsis, found unresponsive EXAM: PORTABLE CHEST 1 VIEW COMPARISON:  10/16/2016 FINDINGS: Two frontal views of the chest demonstrate an enlarged cardiac silhouette. There is increased vascular congestion, with diffuse interstitial and ground-glass opacities suspicious for developing edema. Superimposed infection cannot be excluded. No effusion or  pneumothorax. No acute bony abnormalities. IMPRESSION: 1. Constellation of findings most consistent with congestive heart failure and developing pulmonary edema. Electronically Signed   By: MRanda NgoM.D.   On: 02/13/2023 16:48      Labs: BNP (last 3 results) Recent Labs    02/13/23 1620  BNP [id number]   Basic Metabolic Panel: Recent Labs  Lab 02/13/23 1620 02/13/23 2230 02/14/23 0458 02/15/23 0531 02/16/23 0756 02/17/23 0336  NA 134*  --  137 136 139 139  K 4.6  --  3.9 3.7  3.7 3.9  CL 103  --  101 102 102 102  CO2 20*  --  '27 28 30 31  '$ GLUCOSE 224*  --  81 160* 148* 144*  BUN 20  --  20 25* 31* 34*  CREATININE 1.20 1.14 1.11 1.21 1.04 1.09  CALCIUM 8.7*  --  8.7* 8.8* 8.8* 8.8*  MG  --   --   --  2.2 2.4 2.4   Liver Function Tests: Recent Labs  Lab 02/13/23 1620  AST 31  ALT 11  ALKPHOS 91  BILITOT 1.7*  PROT 7.9  ALBUMIN 3.8   No results for input(s): "LIPASE", "AMYLASE" in the last 168 hours. No results for input(s): "AMMONIA" in the last 168 hours. CBC: Recent Labs  Lab 02/13/23 1620 02/13/23 2230 02/15/23 0531 02/16/23 0756 02/17/23 0336  WBC 12.0* 10.3 8.4 7.1 7.2  NEUTROABS 9.1*  --   --   --   --   HGB 13.6 13.0 13.1 13.2 13.4  HCT 42.7 41.1 40.7 42.0 42.8  MCV 97.5 97.2 96.4 96.8 97.7  PLT 146* 155 153 149* 148*   Cardiac Enzymes: No results for input(s): "CKTOTAL", "CKMB", "CKMBINDEX", "TROPONINI" in the last 168 hours. BNP: Invalid input(s): "POCBNP" CBG: Recent Labs  Lab 02/16/23 1128 02/16/23 1605 02/16/23 2042 02/16/23 2137 02/17/23 0831  GLUCAP 228* 145* 244* 234* 169*   D-Dimer No results for input(s): "DDIMER" in the last 72 hours. Hgb A1c No results for input(s): "HGBA1C" in the last 72 hours. Lipid Profile No results for input(s): "CHOL", "HDL", "LDLCALC", "TRIG", "CHOLHDL", "LDLDIRECT" in the last 72 hours. Thyroid function studies No results for input(s): "TSH", "T4TOTAL", "T3FREE", "THYROIDAB" in the last 72  hours.  Invalid input(s): "FREET3" Anemia work up No results for input(s): "VITAMINB12", "FOLATE", "FERRITIN", "TIBC", "IRON", "RETICCTPCT" in the last 72 hours. Urinalysis    Component Value Date/Time   COLORURINE YELLOW (A) 02/13/2023 1826   APPEARANCEUR HAZY (A) 02/13/2023 1826   APPEARANCEUR Clear 01/01/2014 2238   LABSPEC 1.009 02/13/2023 1826   LABSPEC 1.025 01/01/2014 2238   PHURINE 6.0 02/13/2023 1826   GLUCOSEU 50 (A) 02/13/2023 1826   GLUCOSEU Negative 01/01/2014 2238   HGBUR NEGATIVE 02/13/2023 1826   BILIRUBINUR NEGATIVE 02/13/2023 1826   BILIRUBINUR Negative 01/01/2014 2238   KETONESUR NEGATIVE 02/13/2023 1826   PROTEINUR 30 (A) 02/13/2023 1826   NITRITE POSITIVE (A) 02/13/2023 1826   LEUKOCYTESUR NEGATIVE 02/13/2023 1826   LEUKOCYTESUR Negative 01/01/2014 2238   Sepsis Labs Recent Labs  Lab 02/13/23 2230 02/15/23 0531 02/16/23 0756 02/17/23 0336  WBC 10.3 8.4 7.1 7.2   Microbiology Recent Results (from the past 240 hour(s))  Blood Culture (routine x 2)     Status: None (Preliminary result)   Collection Time: 02/13/23  4:20 PM   Specimen: Right Antecubital; Blood  Result Value Ref Range Status   Specimen Description RIGHT ANTECUBITAL  Final   Special Requests   Final    BOTTLES DRAWN AEROBIC AND ANAEROBIC Blood Culture adequate volume   Culture   Final    NO GROWTH 4 DAYS Performed at [facility], [address] [place], [place] [address]    Report Status PENDING  Incomplete  Blood Culture (routine x 2)     Status: None (Preliminary result)   Collection Time: 02/13/23  4:20 PM   Specimen: BLOOD  Result Value Ref Range Status   Specimen Description BLOOD BLOOD RIGHT WRIST  Final   Special Requests   Final  BOTTLES DRAWN AEROBIC AND ANAEROBIC Blood Culture adequate volume   Culture   Final    NO GROWTH 4 DAYS Performed at [facility], [address] [place], [place] [address]    Report Status PENDING  Incomplete  Resp  panel by RT-PCR (RSV, Flu A&B, Covid) Anterior Nasal Swab     Status: None   Collection Time: 02/13/23  4:20 PM   Specimen: Anterior Nasal Swab  Result Value Ref Range Status   SARS Coronavirus 2 by RT PCR NEGATIVE NEGATIVE Final    Comment: (NOTE) SARS-CoV-2 target nucleic acids are NOT DETECTED.  The SARS-CoV-2 RNA is generally detectable in upper respiratory specimens during the acute phase of infection. The lowest concentration of SARS-CoV-2 viral copies this assay can detect is 138 copies/mL. A negative result does not preclude SARS-Cov-2 infection and should not be used as the sole basis for treatment or other patient management decisions. A negative result may occur with  improper specimen collection/handling, submission of specimen other than nasopharyngeal swab, presence of viral mutation(s) within the areas targeted by this assay, and inadequate number of viral copies(<138 copies/mL). A negative result must be combined with clinical observations, patient history, and epidemiological information. The expected result is Negative.  Fact Sheet for Patients:  EntrepreneurPulse.com.au  Fact Sheet for Healthcare Providers:  IncredibleEmployment.be  This test is no t yet approved or cleared by the [place] FDA and  has been authorized for detection and/or diagnosis of SARS-CoV-2 by FDA under an Emergency Use Authorization (EUA). This EUA will remain  in effect (meaning this test can be used) for the duration of the COVID-19 declaration under Section 564(b)(1) of the Act, 21 U.S.C.section 360bbb-3(b)(1), unless the authorization is terminated  or revoked sooner.       Influenza A by PCR NEGATIVE NEGATIVE Final   Influenza B by PCR NEGATIVE NEGATIVE Final    Comment: (NOTE) The Xpert Xpress SARS-CoV-2/FLU/RSV plus assay is intended as an aid in the diagnosis of influenza from Nasopharyngeal swab specimens and should not be used as a  sole basis for treatment. Nasal washings and aspirates are unacceptable for Xpert Xpress SARS-CoV-2/FLU/RSV testing.  Fact Sheet for Patients: EntrepreneurPulse.com.au  Fact Sheet for Healthcare Providers: IncredibleEmployment.be  This test is not yet approved or cleared by the [place] FDA and has been authorized for detection and/or diagnosis of SARS-CoV-2 by FDA under an Emergency Use Authorization (EUA). This EUA will remain in effect (meaning this test can be used) for the duration of the COVID-19 declaration under Section 564(b)(1) of the Act, 21 U.S.C. section 360bbb-3(b)(1), unless the authorization is terminated or revoked.     Resp Syncytial Virus by PCR NEGATIVE NEGATIVE Final    Comment: (NOTE) Fact Sheet for Patients: EntrepreneurPulse.com.au  Fact Sheet for Healthcare Providers: IncredibleEmployment.be  This test is not yet approved or cleared by the [place] FDA and has been authorized for detection and/or diagnosis of SARS-CoV-2 by FDA under an Emergency Use Authorization (EUA). This EUA will remain in effect (meaning this test can be used) for the duration of the COVID-19 declaration under Section 564(b)(1) of the Act, 21 U.S.C. section 360bbb-3(b)(1), unless the authorization is terminated or revoked.  Performed at [facility], [address], [place], [place] [address]   Urine Culture (for pregnant, neutropenic or urologic patients or patients with an indwelling urinary catheter)     Status: Abnormal   Collection Time: 02/13/23  6:26 PM   Specimen: Urine, Clean Catch  Result  Value Ref Range Status   Specimen Description   Final    URINE, CLEAN CATCH Performed at [facility], [address] [place], [place] [address]    Special Requests   Final    NONE Performed at [facility], [place], [place] [address]    Culture  >=100,000 COLONIES/mL ESCHERICHIA COLI (A)  Final   Report Status 02/15/2023 FINAL  Final   Organism ID, Bacteria ESCHERICHIA COLI (A)  Final      Susceptibility   Escherichia coli - MIC*    AMPICILLIN 8 SENSITIVE Sensitive     CEFAZOLIN <=4 SENSITIVE Sensitive     CEFEPIME <=0.12 SENSITIVE Sensitive     CEFTRIAXONE <=0.25 SENSITIVE Sensitive     CIPROFLOXACIN <=0.25 SENSITIVE Sensitive     GENTAMICIN <=1 SENSITIVE Sensitive     IMIPENEM <=0.25 SENSITIVE Sensitive     NITROFURANTOIN 32 SENSITIVE Sensitive     TRIMETH/SULFA <=20 SENSITIVE Sensitive     AMPICILLIN/SULBACTAM <=2 SENSITIVE Sensitive     PIP/TAZO <=4 SENSITIVE Sensitive     * >=100,000 COLONIES/mL ESCHERICHIA COLI     Time spent, 35 minutes, billed as level 2, since pt left AMA and was not discharged.    Enzo Bi, MD  [facility] 02/17/2023, 11:29 AM

## 2023-02-17 NOTE — Progress Notes (Signed)
Per MD request, walked patient.  Patient, with oxygen at 2 L, desat to low 80s, HR 103.  Increased O2 to 4L, O2 sat at 85 with HR 107.  Did not attempt to walk patient on room air.     Returned to pt room and told his wife that he was still too low on oxygen with talking and that it would probably be tomorrow before he discharged to allow time to set up home health/home oxygen.    Patient started yelling, threw himself backwards into recliner, ripped off his prima-fit, refused to put nasal cannula on.  Left the room per wife's request.    Pt and wife heard yelling outside the room for 20-30 minutes.    Dr. Billie Ruddy spoke with patient and wife.  Patient left AMA, refused to sign AMA form.  Let wife know where Lasix prescription had been sent.

## 2023-02-19 LAB — CULTURE, BLOOD (ROUTINE X 2)
Culture: NO GROWTH
Culture: NO GROWTH
Special Requests: ADEQUATE
Special Requests: ADEQUATE

## 2023-02-23 NOTE — Progress Notes (Deleted)
   Patient ID: Willie Krueger, male    DOB: May 15, 1956, 67 y.o.   MRN: [id number]  HPI  Willie Krueger is a 67 y/o male with a history of  Echo 02/14/23 showed an EF of 55-60% along with mild LAE and mild Willie.   Admitted 02/13/23 due to LOC after being found unresponsive in his backyard. Briefly bagged & place on nonbreather. Patient reported SOB/ cough. CTA showed pulmonary edema. Diuresed but then left AMA.   He presents today for his initial HF visit with a chief complaint of   Review of Systems    Physical Exam    Assessment & Plan:  1: Chronic heart failure with preserved ejection fraction- - NYHA class  - BNP 02/13/23 was 540.8  2: HTN-  - BP - saw PCP @ [facility] primary care 11/24/22 - BMP 02/17/23 showed sodium 139, potassium 3.9, creatinine 1.09 & GFR >60  3: DM- - A1c 02/13/23 was 5.8%  4: Upper extremity DVT-  5: Tobacco-

## 2023-02-24 ENCOUNTER — Telehealth: Payer: Self-pay | Admitting: Family

## 2023-02-24 ENCOUNTER — Encounter: Payer: POS | Admitting: Family

## 2023-02-24 NOTE — Telephone Encounter (Signed)
Patient did not show for his initial [facility] Failure [facility] appointment on 02/24/23.

## 2023-04-10 ENCOUNTER — Ambulatory Visit: Payer: POS | Admitting: Podiatry

## 2023-04-14 ENCOUNTER — Ambulatory Visit (INDEPENDENT_AMBULATORY_CARE_PROVIDER_SITE_OTHER): Payer: 59 | Admitting: Podiatry

## 2023-04-14 ENCOUNTER — Encounter: Payer: Self-pay | Admitting: Podiatry

## 2023-04-14 VITALS — BP 133/68 | HR 77

## 2023-04-14 DIAGNOSIS — M79674 Pain in right toe(s): Secondary | ICD-10-CM

## 2023-04-14 DIAGNOSIS — M79675 Pain in left toe(s): Secondary | ICD-10-CM | POA: Diagnosis not present

## 2023-04-14 DIAGNOSIS — L97522 Non-pressure chronic ulcer of other part of left foot with fat layer exposed: Secondary | ICD-10-CM

## 2023-04-14 DIAGNOSIS — B351 Tinea unguium: Secondary | ICD-10-CM

## 2023-04-14 DIAGNOSIS — E0843 Diabetes mellitus due to underlying condition with diabetic autonomic (poly)neuropathy: Secondary | ICD-10-CM

## 2023-04-14 NOTE — Progress Notes (Signed)
Chief Complaint  Patient presents with   Nail Problem    "These toes look a mess.  Just cut them."    SUBJECTIVE Patient with a history of diabetes mellitus presents to office today complaining of elongated, thickened nails that cause pain while ambulating in shoes.  Patient is unable to trim their own nails.  Patient also develops pain and tenderness associated to the thick calluses that developed to the distal tips of the second toes bilateral.  Patient is here for further evaluation and treatment.  Past Medical History:  Diagnosis Date   Acute postoperative pain 02/09/2017   Chronic pain    Chronic pain associated with significant psychosocial dysfunction 05/04/2015   Last Assessment & Plan:  He is having worsening problems with breakthrough pain. Gabapentin is increased from 800 mg 3 times daily to 1200 mg 3 times daily. Oxycodone is also renewed. He continues to through evaluation at [facility] pain clinic.    Diabetes mellitus without complication    Heart murmur    History of spinal stenosis 10/31/2015   C3-4 with severe central canal stenosis with small amount of myelomalacia present. Moderate to severe canal stenosis is also present at C4-5 and C5-6. Moderate to severe neuroforaminal narrowing between the levels of C2-C7   Hyperlipidemia    Hypertension    Leg weakness 07/07/2014   Mitral valve regurgitation    Nerve root inflammation 06/26/2014   Last Assessment & Plan:  Status is unchanged with ongoing pain and limited mobility. Continue with physical therapy and pain management.    Osteoarthritis     Allergies  Allergen Reactions   Piperacillin-Tazobactam In Dex Rash   Cleocin [Clindamycin] Rash   Clindamycin Phosphate Rash   Sulfa Antibiotics Rash   Zosyn [Piperacillin Sod-Tazobactam So] Rash     OBJECTIVE General Patient is awake, alert, and oriented x 3 and in no acute distress. Derm Skin is dry and supple bilateral. Negative open lesions or macerations. Remaining  integument unremarkable. Nails are tender, long, thickened and dystrophic with subungual debris, consistent with onychomycosis, 1-5 bilateral. No signs of infection noted.  Hyperkeratotic preulcerative callus noted to the distal tips of the second toes bilateral.  After debridement of the preulcerative callus to the distal tip of the left second toe there is an underlying ulcer measuring approximately 0.5 x 0.5 x 0.1 cm.  Granular wound base.  There is some slight maceration around the area.  No purulence or drainage.  Overall it appears very stable.  It does not probe to bone. Vasc skin is warm to touch.  Moderate edema noted Neuro diminished to light touch Musculoskeletal Exam history of partial toe amputation left great toe  ASSESSMENT 1. Diabetes Mellitus w/ peripheral neuropathy 2.  Pain due to onychomycosis of toenails bilateral 3.  Preulcerative callus lesions distal tips of the second toes bilateral 4.  Ulcer distal tip of the left second toe  PLAN OF CARE 1. Patient evaluated today. 2. Instructed to maintain good pedal hygiene and foot care. Stressed importance of controlling blood sugar.  3. Mechanical debridement of nails 1-5 bilaterally performed using a nail nipper. Filed with dremel without incident.  4.  The preulcerative calluses to the distal tips of the second toes bilateral was also debrided today.   5.  Medically necessary excisional debridement including subcutaneous tissue of the ulcer to the distal tip of the left second toe was performed today using a tissue nipper.  Excisional debridement of the necrotic nonviable tissue down to  healthier bleeding viable tissue was performed with postdebridement measurement same as pre-.  Recommend triple antibiotic and a Band-Aid daily  .  Return to clinic in 3 mos.     Felecia Shelling, DPM [facility]  Dr. Felecia Shelling, DPM    [address] [place], [place] [address]                 Office [phone]  Fax [phone]

## 2023-05-12 ENCOUNTER — Ambulatory Visit (INDEPENDENT_AMBULATORY_CARE_PROVIDER_SITE_OTHER): Payer: 59 | Admitting: Podiatry

## 2023-05-12 ENCOUNTER — Ambulatory Visit (INDEPENDENT_AMBULATORY_CARE_PROVIDER_SITE_OTHER): Payer: 59

## 2023-05-12 VITALS — BP 130/74 | HR 78 | Temp 98.0°F | Resp 17

## 2023-05-12 DIAGNOSIS — L97522 Non-pressure chronic ulcer of other part of left foot with fat layer exposed: Secondary | ICD-10-CM | POA: Diagnosis not present

## 2023-05-12 DIAGNOSIS — M869 Osteomyelitis, unspecified: Secondary | ICD-10-CM

## 2023-05-12 DIAGNOSIS — M79672 Pain in left foot: Secondary | ICD-10-CM

## 2023-05-12 NOTE — Progress Notes (Signed)
Chief Complaint  Patient presents with   Diabetic Ulcer    Patient came in today for diabetic ulcer left foot 2nd toe started 2 weeks ago, patient bleeding, having drainage, and some pain, and chills, X-Rays done     SUBJECTIVE Patient with a history of diabetes mellitus presents to office today for acute infection of the left second toe.  Patient states that about 2 weeks ago he noticed increased redness and drainage and swelling to the toe concerning for infection. PSxHx partial amputation to the left great toe  Past Medical History:  Diagnosis Date   Acute postoperative pain 02/09/2017   Chronic pain    Chronic pain associated with significant psychosocial dysfunction 05/04/2015   Last Assessment & Plan:  He is having worsening problems with breakthrough pain. Gabapentin is increased from 800 mg 3 times daily to 1200 mg 3 times daily. Oxycodone is also renewed. He continues to through evaluation at [facility] pain clinic.    Diabetes mellitus without complication (HCC)    Heart murmur    History of spinal stenosis 10/31/2015   C3-4 with severe central canal stenosis with small amount of myelomalacia present. Moderate to severe canal stenosis is also present at C4-5 and C5-6. Moderate to severe neuroforaminal narrowing between the levels of C2-C7   Hyperlipidemia    Hypertension    Leg weakness 07/07/2014   Mitral valve regurgitation    Nerve root inflammation 06/26/2014   Last Assessment & Plan:  Status is unchanged with ongoing pain and limited mobility. Continue with physical therapy and pain management.    Osteoarthritis     Allergies  Allergen Reactions   Piperacillin-Tazobactam In Dex Rash   Cleocin [Clindamycin] Rash   Clindamycin Phosphate Rash   Sulfa Antibiotics Rash   Zosyn [Piperacillin Sod-Tazobactam So] Rash    LT second toe 05/12/2023  LT second toe 05/12/2023  OBJECTIVE General Patient is awake, alert, and oriented x 3 and in no acute distress. Derm necrotic  ulcer with soft tissue necrosis, edema, and maceration of the left second toe.  Distal phalanx exposed.  Please see above noted photo  Vasc skin is warm to touch.  Increased edema noted to the left foot and ankle.  Erythema noted extending proximal along the dorsum of the midfoot Neuro light touch and protective threshold diminished Musculoskeletal Exam history of partial toe amputation left great toe Radiographic exam LT foot 05/12/2023 diffuse degenerative changes noted throughout the pedal joints of the foot.  No acute fractures identified.  Erosion of the distal phalanx noted to the left second toe with soft tissue edema.  No gas within the tissue.  Soft tissue edema noted.  Concerning for osteomyelitis  ASSESSMENT 1. Diabetes Mellitus w/ peripheral neuropathy 2.  Necrotic ulcer encompassing the left second toe with acute cellulitis 3.  Suspect osteomyelitis left second toe  -Patient evaluated.  X-rays reviewed -Due to the advanced progressive cellulitis of the foot and concern for osteomyelitis recommend that he goes immediately to the emergency department at [facility] for admission and comprehensive care and workup.  Patient will require at least amputation of the left second toe. -Recommend MRI LT foot, ABIs, likely vascular consult -Betadine soaked wet-to-dry dressings applied. -Postsurgical shoe dispensed.  WBAT -Podiatry will follow while inpatient    Felecia Shelling, DPM [facility]  Dr. Felecia Shelling, DPM    [address]  [place], [place] [address]                Office [phone]  Fax [phone]

## 2023-05-13 ENCOUNTER — Other Ambulatory Visit: Payer: Self-pay

## 2023-05-13 ENCOUNTER — Encounter: Payer: Self-pay | Admitting: Emergency Medicine

## 2023-05-13 ENCOUNTER — Emergency Department
Admission: EM | Admit: 2023-05-13 | Discharge: 2023-05-13 | Disposition: A | Discharge status: Home / Self Care | Payer: 59 | Attending: Student in an Organized Health Care Education/Training Program | Admitting: Student in an Organized Health Care Education/Training Program

## 2023-05-13 DIAGNOSIS — E11621 Type 2 diabetes mellitus with foot ulcer: Secondary | ICD-10-CM | POA: Diagnosis not present

## 2023-05-13 DIAGNOSIS — I739 Peripheral vascular disease, unspecified: Secondary | ICD-10-CM | POA: Diagnosis not present

## 2023-05-13 DIAGNOSIS — Z5321 Procedure and treatment not carried out due to patient leaving prior to being seen by health care provider: Secondary | ICD-10-CM | POA: Diagnosis not present

## 2023-05-13 DIAGNOSIS — M79672 Pain in left foot: Secondary | ICD-10-CM | POA: Insufficient documentation

## 2023-05-13 DIAGNOSIS — L97529 Non-pressure chronic ulcer of other part of left foot with unspecified severity: Secondary | ICD-10-CM | POA: Insufficient documentation

## 2023-05-13 LAB — COMPREHENSIVE METABOLIC PANEL
ALT: 13 U/L (ref 0–44)
AST: 18 U/L (ref 15–41)
Albumin: 3.6 g/dL (ref 3.5–5.0)
Alkaline Phosphatase: 92 U/L (ref 38–126)
Anion gap: 10 (ref 5–15)
BUN: 42 mg/dL — ABNORMAL HIGH (ref 8–23)
CO2: 21 mmol/L — ABNORMAL LOW (ref 22–32)
Calcium: 9.1 mg/dL (ref 8.9–10.3)
Chloride: 103 mmol/L (ref 98–111)
Creatinine, Ser: 1.97 mg/dL — ABNORMAL HIGH (ref 0.61–1.24)
GFR, Estimated: 36 mL/min — ABNORMAL LOW (ref >60)
Glucose, Bld: 120 mg/dL — ABNORMAL HIGH (ref 70–99)
Potassium: 4 mmol/L (ref 3.5–5.1)
Sodium: 134 mmol/L — ABNORMAL LOW (ref 135–145)
Total Bilirubin: 0.9 mg/dL (ref 0.3–1.2)
Total Protein: 8.1 g/dL (ref 6.5–8.1)

## 2023-05-13 LAB — CBC WITH DIFFERENTIAL/PLATELET
Abs Immature Granulocytes: 0.05 10*3/uL (ref 0.00–0.07)
Basophils Absolute: 0.1 10*3/uL (ref 0.0–0.1)
Basophils Relative: 1 %
Eosinophils Absolute: 0.1 10*3/uL (ref 0.0–0.5)
Eosinophils Relative: 1 %
HCT: 41.7 % (ref 39.0–52.0)
Hemoglobin: 13.9 g/dL (ref 13.0–17.0)
Immature Granulocytes: 0 %
Lymphocytes Relative: 11 %
Lymphs Abs: 1.4 10*3/uL (ref 0.7–4.0)
MCH: 32.9 pg (ref 26.0–34.0)
MCHC: 33.3 g/dL (ref 30.0–36.0)
MCV: 98.6 fL (ref 80.0–100.0)
Monocytes Absolute: 1.1 10*3/uL — ABNORMAL HIGH (ref 0.1–1.0)
Monocytes Relative: 9 %
Neutro Abs: 9.9 10*3/uL — ABNORMAL HIGH (ref 1.7–7.7)
Neutrophils Relative %: 78 %
Platelets: 178 10*3/uL (ref 150–400)
RBC: 4.23 MIL/uL (ref 4.22–5.81)
RDW: 14 % (ref 11.5–15.5)
WBC: 12.5 10*3/uL — ABNORMAL HIGH (ref 4.0–10.5)
nRBC: 0 % (ref 0.0–0.2)

## 2023-05-13 LAB — LACTIC ACID, PLASMA: Lactic Acid, Venous: 1.2 mmol/L (ref 0.5–1.9)

## 2023-05-13 NOTE — ED Notes (Signed)
Pt to STAT desk, st "are ya'll gonna do any work tonight?"; st he is leaving; upset over wait time; encouraged to stay due to possible need to be admitted into the hospital; pt remains upset and st he is not waiting any longer and will "be back tomorrow"

## 2023-05-13 NOTE — ED Triage Notes (Signed)
Patient to ED via POV for left foot pain. Patient states he needs a toe amputation and was sent by MD. Patient has diabetic ulcer to left foot, second toe. MD concerned for osteomyelitis- x-rays obtained yesterday.

## 2023-05-14 ENCOUNTER — Emergency Department
Admission: EM | Admit: 2023-05-14 | Discharge: 2023-05-14 | Disposition: A | Discharge status: Home / Self Care | Payer: 59 | Attending: Emergency Medicine | Admitting: Emergency Medicine

## 2023-05-14 ENCOUNTER — Other Ambulatory Visit: Payer: Self-pay

## 2023-05-14 ENCOUNTER — Emergency Department: Payer: 59

## 2023-05-14 DIAGNOSIS — E119 Type 2 diabetes mellitus without complications: Secondary | ICD-10-CM | POA: Insufficient documentation

## 2023-05-14 DIAGNOSIS — Z5329 Procedure and treatment not carried out because of patient's decision for other reasons: Secondary | ICD-10-CM | POA: Insufficient documentation

## 2023-05-14 DIAGNOSIS — I10 Essential (primary) hypertension: Secondary | ICD-10-CM | POA: Diagnosis not present

## 2023-05-14 DIAGNOSIS — M79672 Pain in left foot: Secondary | ICD-10-CM | POA: Insufficient documentation

## 2023-05-14 DIAGNOSIS — I739 Peripheral vascular disease, unspecified: Secondary | ICD-10-CM | POA: Diagnosis not present

## 2023-05-14 LAB — CBC WITH DIFFERENTIAL/PLATELET
Abs Immature Granulocytes: 0.08 10*3/uL — ABNORMAL HIGH (ref 0.00–0.07)
Basophils Absolute: 0.1 10*3/uL (ref 0.0–0.1)
Basophils Relative: 0 %
Eosinophils Absolute: 0.1 10*3/uL (ref 0.0–0.5)
Eosinophils Relative: 0 %
HCT: 42.5 % (ref 39.0–52.0)
Hemoglobin: 13.7 g/dL (ref 13.0–17.0)
Immature Granulocytes: 0 %
Lymphocytes Relative: 9 %
Lymphs Abs: 1.6 10*3/uL (ref 0.7–4.0)
MCH: 32.5 pg (ref 26.0–34.0)
MCHC: 32.2 g/dL (ref 30.0–36.0)
MCV: 100.7 fL — ABNORMAL HIGH (ref 80.0–100.0)
Monocytes Absolute: 1.8 10*3/uL — ABNORMAL HIGH (ref 0.1–1.0)
Monocytes Relative: 10 %
Neutro Abs: 14.3 10*3/uL — ABNORMAL HIGH (ref 1.7–7.7)
Neutrophils Relative %: 81 %
Platelets: 167 10*3/uL (ref 150–400)
RBC: 4.22 MIL/uL (ref 4.22–5.81)
RDW: 14.5 % (ref 11.5–15.5)
WBC: 17.9 10*3/uL — ABNORMAL HIGH (ref 4.0–10.5)
nRBC: 0 % (ref 0.0–0.2)

## 2023-05-14 LAB — LACTIC ACID, PLASMA: Lactic Acid, Venous: 1.2 mmol/L (ref 0.5–1.9)

## 2023-05-14 MED ORDER — GADOBUTROL 1 MMOL/ML IV SOLN
9.0000 mL | Freq: Once | INTRAVENOUS | Status: AC | PRN
  Administered 2023-05-14: 9 mL via INTRAVENOUS

## 2023-05-14 MED ORDER — SODIUM CHLORIDE 0.9 % IV BOLUS
1000.0000 mL | Freq: Once | INTRAVENOUS | Status: AC
  Administered 2023-05-14: 1000 mL via INTRAVENOUS

## 2023-05-14 MED ORDER — VANCOMYCIN HCL 2000 MG/400ML IV SOLN
2000.0000 mg | Freq: Once | INTRAVENOUS | Status: AC
  Administered 2023-05-14: 2000 mg via INTRAVENOUS
  Filled 2023-05-14: qty 400

## 2023-05-14 NOTE — Sepsis Progress Note (Signed)
Sepsis protocol monitored by eLink ?

## 2023-05-14 NOTE — ED Provider Notes (Signed)
[facility] Provider Note  Patient Contact: 3:47 PM (approximate)   History   Toe Pain   HPI  Willie Krueger is a 67 y.o. male with a history of diabetes, hypertension, hyperlipidemia, presents to the emergency department referred by podiatry.  Patient was assessed by Gala Lewandowsky at the [facility].  He was diagnosed with a necrotic ulcer encompassing the left second toe with acute cellulitis and suspected osteomyelitis of the left second toe.  His x-rays were reviewed at that time and he was referred to the emergency department for admission and MRI of the left foot.  He also recommended vascular consult.  Patient is unsure if fever and chills at home.  He came to the emergency department yesterday but left without being seen.      Physical Exam   Triage Vital Signs: ED Triage Vitals  Enc Vitals Group     BP 05/14/23 1315 104/65     Pulse Rate 05/14/23 1315 95     Resp 05/14/23 1315 18     Temp 05/14/23 1315 98 F (36.7 C)     Temp src --      SpO2 05/14/23 1315 92 %     Weight --      Height --      Head Circumference --      Peak Flow --      Pain Score 05/14/23 1314 10     Pain Loc --      Pain Edu? --      Excl. in GC? --     Most recent vital signs: Vitals:   05/14/23 1626 05/14/23 1940  BP: 110/78 116/80  Pulse: 90 (!) 17  Resp: 17 17  Temp:  98.2 F (36.8 C)  SpO2: 98% 98%     General: Alert and in no acute distress. Eyes:  PERRL. EOMI. Head: No acute traumatic findings ENT:      Nose: No congestion/rhinnorhea.      Mouth/Throat: Mucous membranes are moist.  Neck: No stridor. No cervical spine tenderness to palpation. Cardiovascular:  Good peripheral perfusion Respiratory: Normal respiratory effort without tachypnea or retractions. Lungs CTAB. Good air entry to the bases with no decreased or absent breath sounds. Gastrointestinal: Bowel sounds 4 quadrants. Soft and nontender to palpation. No guarding or  rigidity. No palpable masses. No distention. No CVA tenderness. Musculoskeletal: Full range of motion to all extremities.  Neurologic:  No gross focal neurologic deficits are appreciated.  Skin: Patient has necrotic ulcer encompassing the left second toe with acute cellulitis of the dorsal midfoot.    ED Results / Procedures / Treatments   Labs (all labs ordered are listed, but only abnormal results are displayed) Labs Reviewed  CBC WITH DIFFERENTIAL/PLATELET - Abnormal; Notable for the following components:      Result Value   WBC 17.9 (*)    MCV 100.7 (*)    Neutro Abs 14.3 (*)    Monocytes Absolute 1.8 (*)    Abs Immature Granulocytes 0.08 (*)    All other components within normal limits  CULTURE, BLOOD (ROUTINE X 2)  CULTURE, BLOOD (ROUTINE X 2)  AEROBIC/ANAEROBIC CULTURE W GRAM STAIN (SURGICAL/DEEP WOUND)  LACTIC ACID, PLASMA  LACTIC ACID, PLASMA  COMPREHENSIVE METABOLIC PANEL  LIPASE, BLOOD      PROCEDURES:  Critical Care performed: No  Procedures   MEDICATIONS ORDERED IN ED: Medications  vancomycin (VANCOREADY) IVPB 2000 mg/400 mL (0 mg Intravenous Stopped 05/14/23  1934)  sodium chloride 0.9 % bolus 1,000 mL (0 mLs Intravenous Stopped 05/14/23 1934)  gadobutrol (GADAVIST) 1 MMOL/ML injection 9 mL (9 mLs Intravenous Contrast Given 05/14/23 1914)     IMPRESSION / MDM / ASSESSMENT AND PLAN / ED COURSE  I reviewed the triage vital signs and the nursing notes.                              Assessment and plan: Foot pain: 67 year old male presents to the emergency department with suspected cellulitis and osteomyelitis of the left foot referred by podiatry for admission.  Patient was mildly hypotensive at triage.  On exam, left midfoot was edematous with ulceration of the left second toe visualized.  Pictures can be reviewed on note from podiatry.  Differential diagnosis includes cellulitis, osteomyelitis, necrosis, vascular insufficiency.Marland Kitchen  MRI of the left foot  with contrast was ordered per recommendations from podiatry.  Will consult vascular and will reassess.   ----------------------------------------- 4:17 PM on 05/14/2023 ----------------------------------------- I consulted vascular NP Sheppard Plumber.  Vivia Birmingham reviewed pictures taken by podiatry and anticipates patient going to the OR tomorrow.  Patient became verbally upset stating that he had not eaten all day.  He left the emergency department AGAINST MEDICAL ADVICE.  Vascular nurse practitioner, Sheppard Plumber was notified of patient's AMA status.  FINAL CLINICAL IMPRESSION(S) / ED DIAGNOSES   Final diagnoses:  Left foot pain     Rx / DC Orders   ED Discharge Orders     None        Note:  This document was prepared using Dragon voice recognition software and may include unintentional dictation errors.   Pia Mau [place], Cordelia Poche 05/14/23 Joya Martyr, MD 05/20/23 [phone]

## 2023-05-14 NOTE — ED Notes (Signed)
Patient transported to MRI 

## 2023-05-14 NOTE — ED Notes (Signed)
Pt yelling outside of room for food at this time. Pt screaming so loudly pt was able to get three staff members at pt room door. Provider sts " I am going to be d/c him. RN notified security. Security escorted pt out of the ED.

## 2023-05-14 NOTE — ED Triage Notes (Signed)
Pt comes with c/o toe pain. Pt states it is his left foot and toe beside the big toe. Pt had left before he got seen.   Pt is diabetic and might need amputation.

## 2023-05-14 NOTE — Consult Note (Signed)
Hospital Consult    Reason for Consult:  Left Lower Extremity 2nd toes Gangrene Requesting Physician:  Ellin Mayhew MRN #:  [id number]  History of Present Illness: This is a 67 y.o. male with a history of diabetes, hypertension, hyperlipidemia, presents to the emergency department referred by podiatry. Patient was assessed by Gala Lewandowsky at the [facility]. He was diagnosed with a necrotic ulcer encompassing the left second toe with acute cellulitis and suspected osteomyelitis of the left second toe. His x-rays were reviewed at that time and he was referred to the emergency department for admission and MRI of the left foot.   On exam patient was resting comfortably on stretcher in the ER. Patient states his left foot hurt real bad and the second toe has a bruise. He states this started about a week ago. He endorses pain with exercise and at rest. He denies any chest pain or shortness of breath. He denies any nausea, vomiting or diarrhea. He denies any neuro focal deficits. Patient endorses chronic neck pain from prior surgery. Vitals all remain stable.   Past Medical History:  Diagnosis Date   Acute postoperative pain 02/09/2017   Chronic pain    Chronic pain associated with significant psychosocial dysfunction 05/04/2015   Last Assessment & Plan:  He is having worsening problems with breakthrough pain. Gabapentin is increased from 800 mg 3 times daily to 1200 mg 3 times daily. Oxycodone is also renewed. He continues to through evaluation at [facility] pain clinic.    Diabetes mellitus without complication (HCC)    Heart murmur    History of spinal stenosis 10/31/2015   C3-4 with severe central canal stenosis with small amount of myelomalacia present. Moderate to severe canal stenosis is also present at C4-5 and C5-6. Moderate to severe neuroforaminal narrowing between the levels of C2-C7   Hyperlipidemia    Hypertension    Leg weakness 07/07/2014   Mitral valve regurgitation     Nerve root inflammation 06/26/2014   Last Assessment & Plan:  Status is unchanged with ongoing pain and limited mobility. Continue with physical therapy and pain management.    Osteoarthritis     Past Surgical History:  Procedure Laterality Date   AMPUTATION TOE Left 10/2016   partial   CARPAL TUNNEL RELEASE     FOOT SURGERY Left 09/2016   IRRIGATION AND DEBRIDEMENT FOOT Left 10/13/2016   Procedure: IRRIGATION AND DEBRIDEMENT FOOT;  Surgeon: Gwyneth Revels, DPM;  Location: [facility] ORS;  Service: Podiatry;  Laterality: Left;   PICC LINE PLACE PERIPHERAL ([facility] HX) Left [id number]   SPINE SURGERY     TOTAL KNEE ARTHROPLASTY Left 02/14/2022   Procedure: TOTAL KNEE ARTHROPLASTY;  Surgeon: Jodi Geralds, MD;  Location: [facility] ORS;  Service: Orthopedics;  Laterality: Left;    Allergies  Allergen Reactions   Piperacillin-Tazobactam In Dex Rash   Cleocin [Clindamycin] Rash   Clindamycin Phosphate Rash   Sulfa Antibiotics Rash   Zosyn [Piperacillin Sod-Tazobactam So] Rash    Prior to Admission medications   Medication Sig Start Date End Date Taking? Authorizing Provider  acetaminophen (TYLENOL) 650 MG CR tablet Take 1,300 mg by mouth every 4 (four) hours as needed for pain.    [provider]  aspirin EC 81 MG tablet Take 81 mg by mouth daily.    [provider]  atorvastatin (LIPITOR) 40 MG tablet Take 40 mg by mouth daily.    [provider]  cetirizine (ZYRTEC) 10 MG tablet Take 10  mg by mouth daily.    [provider]  DULoxetine (CYMBALTA) 30 MG capsule Take 30 mg by mouth daily.    [provider]  furosemide (LASIX) 40 MG tablet Take 1 tablet (40 mg total) by mouth 2 (two) times daily for 14 days. 02/17/23 03/03/23  Darlin Priestly, MD  gabapentin (NEURONTIN) 800 MG tablet Take 800 mg by mouth 4 (four) times daily.    [provider]  glimepiride (AMARYL) 2 MG tablet Take 2 mg by mouth daily with breakfast.     [provider]  lisinopril  (PRINIVIL,ZESTRIL) 40 MG tablet Take 40 mg by mouth daily.    [provider]  metaxalone (SKELAXIN) 400 MG tablet Take 400 mg by mouth 2 (two) times daily.    [provider]  metFORMIN (GLUCOPHAGE-XR) 500 MG 24 hr tablet Take 1,500 mg by mouth daily with breakfast.    [provider]    Social History   Socioeconomic History   Marital status: Married    Spouse name: Not on file   Number of children: Not on file   Years of education: Not on file   Highest education level: Not on file  Occupational History   Not on file  Tobacco Use   Smoking status: Every Day    Packs/day: 1    Types: Cigarettes   Smokeless tobacco: Never  Vaping Use   Vaping Use: Never used  Substance and Sexual Activity   Alcohol use: No    Alcohol/week: 0.0 standard drinks of alcohol   Drug use: No   Sexual activity: Yes    Partners: Female  Other Topics Concern   Not on file  Social History Narrative   Not on file   Social Determinants of Health   Financial Resource Strain: Not on file  Food Insecurity: No Food Insecurity (02/14/2023)   Hunger Vital Sign    Worried About Running Out of Food in the Last Year: Never true    Ran Out of Food in the Last Year: Never true  Transportation Needs: No Transportation Needs (02/14/2023)   PRAPARE - Administrator, Civil Service (Medical): No    Lack of Transportation (Non-Medical): No  Physical Activity: Not on file  Stress: Not on file  Social Connections: Not on file  Intimate Partner Violence: Not At Risk (02/14/2023)   Humiliation, Afraid, Rape, and Kick questionnaire    Fear of Current or Ex-Partner: No    Emotionally Abused: No    Physically Abused: No    Sexually Abused: No     Family History  Problem Relation Age of Onset   Cancer Mother    Diabetes Mother    Heart disease Father     ROS: Otherwise negative unless mentioned in HPI  Physical Examination  Vitals:   05/14/23 1315  BP: 104/65   Pulse: 95  Resp: 18  Temp: 98 F (36.7 C)  SpO2: 92%   There is no height or weight on file to calculate BMI.  General:  WDWN in NAD Gait: Not observed HENT: WNL, normocephalic Pulmonary: normal non-labored breathing, without Rales, rhonchi,  wheezing Cardiac: regular, without  Murmurs, rubs or gallops; without carotid bruits Abdomen: Positive bowel sounds, soft, NT/ND, no masses Skin: without rashes Vascular Exam/Pulses: Bilateral lower extremities with 2+ pitting edema. Extremities: with ischemic changes, with Gangrene , with cellulitis; with open wounds; All to left lower extremity foot 2nd toe. Musculoskeletal: no muscle wasting or atrophy  Neurologic: A&O  X 3;  No focal weakness or paresthesias are detected; speech is fluent/normal Psychiatric:  The pt has Normal affect. Lymph:  Unremarkable  CBC    Component Value Date/Time   WBC 12.5 (H) 05/13/2023 1815   RBC 4.23 05/13/2023 1815   HGB 13.9 05/13/2023 1815   HGB 13.6 01/01/2014 2238   HCT 41.7 05/13/2023 1815   HCT 40.2 01/01/2014 2238   PLT 178 05/13/2023 1815   PLT 170 01/01/2014 2238   MCV 98.6 05/13/2023 1815   MCV 92 01/01/2014 2238   MCH 32.9 05/13/2023 1815   MCHC 33.3 05/13/2023 1815   RDW 14.0 05/13/2023 1815   RDW 14.2 01/01/2014 2238   LYMPHSABS 1.4 05/13/2023 1815   MONOABS 1.1 (H) 05/13/2023 1815   EOSABS 0.1 05/13/2023 1815   BASOSABS 0.1 05/13/2023 1815    BMET    Component Value Date/Time   NA 134 (L) 05/13/2023 1815   NA 138 01/01/2014 2238   K 4.0 05/13/2023 1815   K 4.4 01/01/2014 2238   CL 103 05/13/2023 1815   CL 109 (H) 01/01/2014 2238   CO2 21 (L) 05/13/2023 1815   CO2 24 01/01/2014 2238   GLUCOSE 120 (H) 05/13/2023 1815   GLUCOSE 140 (H) 01/01/2014 2238   BUN 42 (H) 05/13/2023 1815   BUN 15 01/01/2014 2238   CREATININE 1.97 (H) 05/13/2023 1815   CREATININE 1.02 02/20/2016 1217   CALCIUM 9.1 05/13/2023 1815   CALCIUM 8.6 01/01/2014 2238   GFRNONAA 36 (L) 05/13/2023 1815    GFRNONAA >60 01/01/2014 2238   GFRAA >60 10/12/2018 1140   GFRAA >60 01/01/2014 2238    COAGS: Lab Results  Component Value Date   INR 1.3 (H) 02/13/2023     Non-Invasive Vascular Imaging:   MRI of Left Foot Ordered. No results at this time  Statin:  Yes.   Beta Blocker:  No. Aspirin:  Yes.   ACEI:  Yes.   ARB:  No. CCB use:  No Other antiplatelets/anticoagulants:  No.    ASSESSMENT/PLAN: This is a 67 y.o. male He was diagnosed with a necrotic ulcer encompassing the left second toe with acute cellulitis and suspected osteomyelitis of the left second toe. His x-rays were reviewed at that time and he was referred to the emergency department for admission and MRI of the left foot.   PLAN: Vascular Surgery plans on taking the patient to the vascular lab for a left lower extremity angiogram with possible intervention tomorrow 05/15/2023. I discussed in detail with the patient the procedure, benefits, risks and complications. Patient verbalizes his understanding. I answered all the patients questions. He would like to proceed as soon as possible. Patient will be made NPO after midnight tonight.    -I discussed the plan in detail with Dr. Festus Barren MD and he agrees with the plan.    Marcie Bal [facility] 05/14/2023 4:16 PM

## 2023-05-14 NOTE — Progress Notes (Signed)
PHARMACY -  BRIEF ANTIBIOTIC NOTE   Pharmacy has received consult(s) for vancomycin from an ED provider.  The patient's profile has been reviewed for ht/wt/allergies/indication/available labs.    One time order(s) placed for vancomycin 2000 mg IV x 1  Further antibiotics/pharmacy consults should be ordered by admitting physician if indicated.                       Thank you, Lowella Bandy 05/14/2023  4:07 PM

## 2023-05-14 NOTE — ED Notes (Signed)
Pt asked RN for food. RN advised that checking with Md would be needed.

## 2023-05-15 LAB — WOUND CULTURE

## 2023-05-15 LAB — CULTURE, BLOOD (ROUTINE X 2): Special Requests: ADEQUATE

## 2023-05-15 SURGERY — LOWER EXTREMITY ANGIOGRAPHY
Anesthesia: Moderate Sedation | Laterality: Left

## 2023-05-16 LAB — CULTURE, BLOOD (ROUTINE X 2): Special Requests: ADEQUATE

## 2023-05-17 LAB — AEROBIC/ANAEROBIC CULTURE W GRAM STAIN (SURGICAL/DEEP WOUND): Special Requests: NORMAL

## 2023-05-17 LAB — CULTURE, BLOOD (ROUTINE X 2): Culture: NO GROWTH

## 2023-05-19 LAB — CULTURE, BLOOD (ROUTINE X 2): Culture: NO GROWTH

## 2023-05-20 LAB — AEROBIC/ANAEROBIC CULTURE W GRAM STAIN (SURGICAL/DEEP WOUND)

## 2023-07-17 ENCOUNTER — Ambulatory Visit: Payer: Medicare Other | Admitting: Podiatry

## 2023-08-19 ENCOUNTER — Other Ambulatory Visit: Payer: Self-pay | Admitting: Podiatry

## 2023-08-19 DIAGNOSIS — L97522 Non-pressure chronic ulcer of other part of left foot with fat layer exposed: Secondary | ICD-10-CM

## 2023-08-19 DIAGNOSIS — M869 Osteomyelitis, unspecified: Secondary | ICD-10-CM

## 2023-08-19 DIAGNOSIS — M79672 Pain in left foot: Secondary | ICD-10-CM

## 2024-09-06 ENCOUNTER — Other Ambulatory Visit (INDEPENDENT_AMBULATORY_CARE_PROVIDER_SITE_OTHER): Payer: Self-pay | Admitting: Vascular Surgery

## 2024-09-06 DIAGNOSIS — L97512 Non-pressure chronic ulcer of other part of right foot with fat layer exposed: Secondary | ICD-10-CM

## 2024-09-08 ENCOUNTER — Encounter (INDEPENDENT_AMBULATORY_CARE_PROVIDER_SITE_OTHER)

## 2024-09-08 ENCOUNTER — Encounter (INDEPENDENT_AMBULATORY_CARE_PROVIDER_SITE_OTHER): Admitting: Vascular Surgery
